# Patient Record
Sex: Male | Born: 1940
Health system: Southern US, Community
[De-identification: ages and names within clinical notes are randomized; demographics above are authoritative.]

## PROBLEM LIST (undated history)

## (undated) DIAGNOSIS — E7800 Pure hypercholesterolemia, unspecified: Secondary | ICD-10-CM

## (undated) DIAGNOSIS — F32A Depression, unspecified: Secondary | ICD-10-CM

## (undated) DIAGNOSIS — M5136 Other intervertebral disc degeneration, lumbar region: Secondary | ICD-10-CM

## (undated) DIAGNOSIS — G629 Polyneuropathy, unspecified: Secondary | ICD-10-CM

## (undated) DIAGNOSIS — C099 Malignant neoplasm of tonsil, unspecified: Secondary | ICD-10-CM

## (undated) DIAGNOSIS — M51369 Other intervertebral disc degeneration, lumbar region without mention of lumbar back pain or lower extremity pain: Secondary | ICD-10-CM

## (undated) DIAGNOSIS — E079 Disorder of thyroid, unspecified: Secondary | ICD-10-CM

## (undated) DIAGNOSIS — F319 Bipolar disorder, unspecified: Secondary | ICD-10-CM

## (undated) DIAGNOSIS — F329 Major depressive disorder, single episode, unspecified: Secondary | ICD-10-CM

## (undated) DIAGNOSIS — C61 Malignant neoplasm of prostate: Secondary | ICD-10-CM

## (undated) DIAGNOSIS — K209 Esophagitis, unspecified without bleeding: Secondary | ICD-10-CM

## (undated) DIAGNOSIS — I10 Essential (primary) hypertension: Secondary | ICD-10-CM

## (undated) DIAGNOSIS — H269 Unspecified cataract: Secondary | ICD-10-CM

## (undated) DIAGNOSIS — F419 Anxiety disorder, unspecified: Secondary | ICD-10-CM

## (undated) DIAGNOSIS — C140 Malignant neoplasm of pharynx, unspecified: Secondary | ICD-10-CM

## (undated) HISTORY — DX: Disorder of thyroid, unspecified: E07.9

## (undated) HISTORY — PX: CARDIAC ELECTROPHYSIOLOGY STUDY AND ABLATION: SHX1294

## (undated) HISTORY — PX: OTHER SURGICAL HISTORY: SHX169

## (undated) HISTORY — DX: Other intervertebral disc degeneration, lumbar region without mention of lumbar back pain or lower extremity pain: M51.369

## (undated) HISTORY — DX: Pure hypercholesterolemia, unspecified: E78.00

## (undated) HISTORY — PX: PROSTATE BIOPSY: SHX241

## (undated) HISTORY — DX: Bipolar disorder, unspecified: F31.9

## (undated) HISTORY — DX: Anxiety disorder, unspecified: F41.9

## (undated) HISTORY — PX: TONSILLECTOMY: SUR1361

## (undated) HISTORY — PX: INGUINAL HERNIA REPAIR: SUR1180

## (undated) HISTORY — DX: Essential (primary) hypertension: I10

## (undated) HISTORY — DX: Other intervertebral disc degeneration, lumbar region: M51.36

## (undated) HISTORY — DX: Unspecified cataract: H26.9

## (undated) HISTORY — DX: Polyneuropathy, unspecified: G62.9

## (undated) HISTORY — DX: Malignant neoplasm of prostate: C61

## (undated) HISTORY — DX: Depression, unspecified: F32.A

## (undated) HISTORY — PX: EYE SURGERY: SHX253

## (undated) HISTORY — DX: Esophagitis, unspecified without bleeding: K20.90

## (undated) HISTORY — DX: Malignant neoplasm of tonsil, unspecified: C09.9

## (undated) HISTORY — DX: Malignant neoplasm of pharynx, unspecified: C14.0

---
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
## 2007-04-07 HISTORY — PX: COLONOSCOPY: SHX174

## 2007-09-18 ENCOUNTER — Encounter (INDEPENDENT_AMBULATORY_CARE_PROVIDER_SITE_OTHER): Payer: Self-pay | Admitting: Urology

## 2007-09-18 ENCOUNTER — Inpatient Hospital Stay (HOSPITAL_COMMUNITY): Admission: RE | Admit: 2007-09-18 | Discharge: 2007-09-19 | Payer: Self-pay | Admitting: Urology

## 2010-06-15 HISTORY — PX: ESOPHAGOGASTRODUODENOSCOPY: SHX1529

## 2010-10-24 NOTE — Op Note (Signed)
NAME:  ALOYSIUS, [name] NO.:  [id number]   MEDICAL RECORD NO.:  [id number]          PATIENT TYPE:  INP   LOCATION:  1444                         FACILITY:  [facility]   PHYSICIAN:  Heloise Purpura, MD      DATE OF BIRTH:  20-Oct-1940   DATE OF PROCEDURE:  09/18/2007  DATE OF DISCHARGE:                               OPERATIVE REPORT   PREOPERATIVE DIAGNOSIS:  Clinically localized adenocarcinoma of the  prostate (clinical stage T1C N0 M0).   POSTOPERATIVE DIAGNOSIS:  Clinically localized adenocarcinoma of the  prostate (clinical stage T1C N0 M0).   PROCEDURE:  1. Robotic assisted laparoscopic radical prostatectomy (bilateral      nerve sparing).  2. Bilateral laparoscopic pelvic lymphadenectomy.   SURGEON:  Heloise Purpura, M.D.   ASSISTANT:  Delman Kitten, M.D.   ANESTHESIA:  General.   COMPLICATIONS:  None.   ESTIMATED BLOOD LOSS:  100 mL.   FLUIDS REPLACED:  2500 mL of Ringer's lactate.   SPECIMENS:  1. Prostate and seminal vesicles.  2. Right pelvic lymph nodes.  3. Left pelvic lymph nodes.   DISPOSITION:  Specimens to pathology.   DRAINS:  1. 20 French Coude catheter.  2. #19 Blake pelvic drain.   INDICATIONS FOR PROCEDURE:  The patient is a 70 year old gentleman with  clinically localized adenocarcinoma of the prostate.  After discussion  regarding management options for treatment, he elected to proceed with  surgical therapy and the above procedures.  Potential risks,  complications, and alternative treatment options were discussed in  detail and informed consent was obtained.   DESCRIPTION OF PROCEDURE:  The patient was taken to the operating room  and a general anesthesia was administered.  He was given preoperative  antibiotics, placed in the dorsal lithotomy position, and prepped and  draped in the usual sterile fashion.  Next a preoperative timeout was  performed.  Foley catheter was then placed and the site was selected  just to the left of  the umbilicus for placement of the camera port.  This was placed using a standard open Hasson technique.  This allowed  entry into the peritoneal cavity under direct vision and without  difficulty.  A 12 mm port was then placed and a pneumoperitoneum was  established.  A 0 degree lens was used to inspect the abdomen and there  was no evidence for any intra-abdominal injuries or other abnormalities.  The remaining ports were then placed.  Bilateral 8 mm ports were placed  10 cm lateral to and just inferior to the camera port site.  An  additional 8 mm robotic port was placed in the far left lateral  abdominal wall.  A 5 mm port was placed between the camera port and the  right robotic port and an additional 12 mm port was placed in the far  right lateral abdominal wall for laparoscopic assistance.  All ports  were placed under direct vision and without difficulty.  The surgical  cart was then docked.  With the aid of the cautery scissors, the bladder  was reflected posteriorly allowing entry into the  space of Retzius and  identification of the endopelvic fascia and prostate.  The endopelvic  fascia was then incised from the apex back to the base of the prostate  bilaterally and the underlying levator muscle fibers were swept  laterally off the prostate.  This isolated the dorsal venous complex  which was then stapled and divided with a 45 mm Flexi ETS stapler.  The  bladder neck was then identified with the aid of Foley catheter  manipulation and the bladder was entered anteriorly exposing the Foley  catheter.  The catheter balloon was deflated and the catheter was  brought into the operative field and used to retract the prostate  anteriorly.  This exposed the posterior bladder neck which was divided  and dissected proceeded between the bladder and prostate until the vasa  deferentia and seminal vesicles were identified.  The vasa deferentia  were isolated, divided, and lifted anteriorly.   The seminal vesicles  were then dissected down to their tips with care to control the seminal  vesicle arterial blood supply with Hemlock clips.  The seminal vesicles  were then lifted anteriorly and the space between Denonvillier's fascia  and the anterior rectum was bluntly developed thereby isolating the  vascular pedicles of the prostate.  The lateral prostatic fascia was  incised sharply bilaterally allowing the neurovascular bundles to be  released.  The vascular pedicles of the prostate were then ligated with  Hemlock clips above the level of the neurovascular bundles and divided  with sharp cold scissors dissection.  The neurovascular bundles were  then swept off the apex of the prostate and urethra.  The urethra was  sharply divided allowing the prostate specimen to be disarticulated.  The pelvis was then copiously irrigated and hemostasis was ensured.  There was no evidence for a rectal injury.  Attention was then turned to  the right pelvic side wall where the fibrofatty tissue between the  external iliac vein, confluence of the iliac vessels, hypogastric  artery, and Cooper's ligament was dissected free from the pelvic side  wall with care to preserve the obturator nerve.  Hemlock clips were used  for lymphostasis and hemostasis.  The specimen was passed off for  permanent pathologic analysis.  An identical procedure was then  performed on the contralateral side.  Attention then turned to the  urethral anastomosis.  A 2-0 Vicryl slip knot was placed between  Denonvillier's fascia, the posterior bladder, and the posterior urethra  to reapproximate these structures.  The stitch through the bladder neck  did come through the bladder neck but did help to support the urethra  back into its normal anatomic position in the pelvis.  A double arm 3-0  Monocryl suture was then used to perform a 360 degree running tension-  free anastomosis between the bladder neck and urethra to  reapproximate  the bladder neck and urethra.  A new 20 French Coude catheter was  inserted and irrigated.  There were no blood clots within the bladder  and the anastomosis appeared to be water tight.  A #19 Blake drain was  then brought through the left robotic port and appropriately positioned  in the pelvis.  It was secured to the skin with a nylon suture.  The  surgical cart was then undocked.  The right lateral 12 mm port site was  closed with a 0 Vicryl suture placed with the aid of Carter-Thomason  suture passer device.  The remaining port sites were removed under  direct vision and the prostate specimen was removed intact within the  Endopouch bag via the periumbilical port site.  This fascial opening was  then closed with a running 0 Vicryl suture.  All port sites were  injected with 0.25% Marcaine and reapproximated at the skin level with  staples.  Sterile dressings were applied.  The patient appeared to  tolerate the procedure well without complications.  He was able to be  extubated and transferred to the recovery room in satisfactory  condition.     Heloise Purpura, MD  Electronically Signed    LB/MEDQ  D:  09/18/2007  T:  09/18/2007  Job:  [id number]

## 2011-03-06 LAB — CBC
HCT: 47
MCHC: 33.8
MCV: 88.1
Platelets: 193
RDW: 13.4

## 2011-03-06 LAB — BASIC METABOLIC PANEL
BUN: 8
Chloride: 103
Creatinine, Ser: 1.09
Glucose, Bld: 113 — ABNORMAL HIGH
Potassium: 3.8

## 2011-03-06 LAB — TYPE AND SCREEN: ABO/RH(D): O NEG

## 2011-03-06 LAB — HEMOGLOBIN AND HEMATOCRIT, BLOOD
HCT: 40.2
Hemoglobin: 12.7 — ABNORMAL LOW

## 2014-08-10 DIAGNOSIS — I2510 Atherosclerotic heart disease of native coronary artery without angina pectoris: Secondary | ICD-10-CM

## 2014-08-10 DIAGNOSIS — I219 Acute myocardial infarction, unspecified: Secondary | ICD-10-CM | POA: Insufficient documentation

## 2014-08-10 HISTORY — DX: Acute myocardial infarction, unspecified: I21.9

## 2014-08-10 HISTORY — DX: Atherosclerotic heart disease of native coronary artery without angina pectoris: I25.10

## 2014-08-15 DIAGNOSIS — I2129 ST elevation (STEMI) myocardial infarction involving other sites: Secondary | ICD-10-CM

## 2014-08-15 DIAGNOSIS — M6282 Rhabdomyolysis: Secondary | ICD-10-CM

## 2014-08-15 HISTORY — DX: ST elevation (STEMI) myocardial infarction involving other sites: I21.29

## 2014-08-15 HISTORY — DX: Rhabdomyolysis: M62.82

## 2014-08-17 DIAGNOSIS — R7401 Elevation of levels of liver transaminase levels: Secondary | ICD-10-CM | POA: Insufficient documentation

## 2014-08-17 DIAGNOSIS — E039 Hypothyroidism, unspecified: Secondary | ICD-10-CM | POA: Insufficient documentation

## 2014-08-17 DIAGNOSIS — N289 Disorder of kidney and ureter, unspecified: Secondary | ICD-10-CM

## 2014-08-17 HISTORY — DX: Disorder of kidney and ureter, unspecified: N28.9

## 2014-08-17 HISTORY — DX: Hypothyroidism, unspecified: E03.9

## 2014-08-17 HISTORY — DX: Elevation of levels of liver transaminase levels: R74.01

## 2014-08-19 DIAGNOSIS — R112 Nausea with vomiting, unspecified: Secondary | ICD-10-CM

## 2014-08-19 DIAGNOSIS — R9431 Abnormal electrocardiogram [ECG] [EKG]: Secondary | ICD-10-CM

## 2014-08-19 DIAGNOSIS — R079 Chest pain, unspecified: Secondary | ICD-10-CM

## 2014-08-19 HISTORY — DX: Abnormal electrocardiogram (ECG) (EKG): R94.31

## 2014-08-19 HISTORY — DX: Nausea with vomiting, unspecified: R11.2

## 2014-08-19 HISTORY — DX: Chest pain, unspecified: R07.9

## 2014-08-21 DIAGNOSIS — B351 Tinea unguium: Secondary | ICD-10-CM | POA: Insufficient documentation

## 2014-08-21 HISTORY — DX: Tinea unguium: B35.1

## 2014-09-14 DIAGNOSIS — I959 Hypotension, unspecified: Secondary | ICD-10-CM

## 2014-09-14 DIAGNOSIS — I200 Unstable angina: Secondary | ICD-10-CM | POA: Insufficient documentation

## 2014-09-14 DIAGNOSIS — I252 Old myocardial infarction: Secondary | ICD-10-CM

## 2014-09-14 HISTORY — DX: Hypotension, unspecified: I95.9

## 2014-09-14 HISTORY — DX: Old myocardial infarction: I25.2

## 2014-09-14 HISTORY — DX: Unstable angina: I20.0

## 2014-09-17 DIAGNOSIS — R001 Bradycardia, unspecified: Secondary | ICD-10-CM

## 2014-09-17 HISTORY — DX: Bradycardia, unspecified: R00.1

## 2014-09-20 DIAGNOSIS — N179 Acute kidney failure, unspecified: Secondary | ICD-10-CM

## 2014-09-20 HISTORY — DX: Acute kidney failure, unspecified: N17.9

## 2014-09-21 DIAGNOSIS — G250 Essential tremor: Secondary | ICD-10-CM

## 2014-09-21 DIAGNOSIS — T451X5A Adverse effect of antineoplastic and immunosuppressive drugs, initial encounter: Secondary | ICD-10-CM

## 2014-09-21 DIAGNOSIS — G620 Drug-induced polyneuropathy: Secondary | ICD-10-CM

## 2014-09-21 HISTORY — DX: Adverse effect of antineoplastic and immunosuppressive drugs, initial encounter: G62.0

## 2014-09-21 HISTORY — DX: Essential tremor: G25.0

## 2014-09-21 HISTORY — DX: Drug-induced polyneuropathy: T45.1X5A

## 2015-07-05 DIAGNOSIS — R946 Abnormal results of thyroid function studies: Secondary | ICD-10-CM

## 2015-07-05 DIAGNOSIS — C099 Malignant neoplasm of tonsil, unspecified: Secondary | ICD-10-CM

## 2015-11-17 DIAGNOSIS — Z85819 Personal history of malignant neoplasm of unspecified site of lip, oral cavity, and pharynx: Secondary | ICD-10-CM | POA: Diagnosis not present

## 2015-11-17 DIAGNOSIS — R070 Pain in throat: Secondary | ICD-10-CM | POA: Diagnosis not present

## 2015-12-07 DIAGNOSIS — Z923 Personal history of irradiation: Secondary | ICD-10-CM | POA: Diagnosis not present

## 2015-12-07 DIAGNOSIS — M542 Cervicalgia: Secondary | ICD-10-CM | POA: Diagnosis not present

## 2015-12-07 DIAGNOSIS — Z85818 Personal history of malignant neoplasm of other sites of lip, oral cavity, and pharynx: Secondary | ICD-10-CM | POA: Diagnosis not present

## 2015-12-07 DIAGNOSIS — R070 Pain in throat: Secondary | ICD-10-CM | POA: Diagnosis not present

## 2016-01-03 DIAGNOSIS — F329 Major depressive disorder, single episode, unspecified: Secondary | ICD-10-CM | POA: Diagnosis not present

## 2016-01-03 DIAGNOSIS — Z8589 Personal history of malignant neoplasm of other organs and systems: Secondary | ICD-10-CM | POA: Diagnosis not present

## 2016-01-03 DIAGNOSIS — R5383 Other fatigue: Secondary | ICD-10-CM | POA: Diagnosis not present

## 2017-02-22 DIAGNOSIS — C61 Malignant neoplasm of prostate: Secondary | ICD-10-CM | POA: Diagnosis not present

## 2017-02-22 DIAGNOSIS — Z23 Encounter for immunization: Secondary | ICD-10-CM | POA: Diagnosis not present

## 2017-02-22 DIAGNOSIS — Z125 Encounter for screening for malignant neoplasm of prostate: Secondary | ICD-10-CM | POA: Diagnosis not present

## 2017-02-22 DIAGNOSIS — Z0000 Encounter for general adult medical examination without abnormal findings: Secondary | ICD-10-CM | POA: Diagnosis not present

## 2017-02-22 DIAGNOSIS — Z79899 Other long term (current) drug therapy: Secondary | ICD-10-CM | POA: Diagnosis not present

## 2017-02-22 DIAGNOSIS — E785 Hyperlipidemia, unspecified: Secondary | ICD-10-CM | POA: Diagnosis not present

## 2017-02-22 DIAGNOSIS — I10 Essential (primary) hypertension: Secondary | ICD-10-CM | POA: Diagnosis not present

## 2017-02-22 DIAGNOSIS — F329 Major depressive disorder, single episode, unspecified: Secondary | ICD-10-CM | POA: Diagnosis not present

## 2017-02-22 DIAGNOSIS — E039 Hypothyroidism, unspecified: Secondary | ICD-10-CM | POA: Diagnosis not present

## 2017-11-15 DIAGNOSIS — Z85818 Personal history of malignant neoplasm of other sites of lip, oral cavity, and pharynx: Secondary | ICD-10-CM | POA: Diagnosis not present

## 2017-11-15 DIAGNOSIS — Z923 Personal history of irradiation: Secondary | ICD-10-CM | POA: Diagnosis not present

## 2017-11-15 DIAGNOSIS — Z9221 Personal history of antineoplastic chemotherapy: Secondary | ICD-10-CM | POA: Diagnosis not present

## 2017-11-15 DIAGNOSIS — R1310 Dysphagia, unspecified: Secondary | ICD-10-CM | POA: Diagnosis not present

## 2017-11-15 DIAGNOSIS — R0989 Other specified symptoms and signs involving the circulatory and respiratory systems: Secondary | ICD-10-CM | POA: Diagnosis not present

## 2017-11-22 DIAGNOSIS — R1310 Dysphagia, unspecified: Secondary | ICD-10-CM | POA: Diagnosis not present

## 2017-11-25 DIAGNOSIS — R0989 Other specified symptoms and signs involving the circulatory and respiratory systems: Secondary | ICD-10-CM | POA: Diagnosis not present

## 2017-11-25 DIAGNOSIS — D164 Benign neoplasm of bones of skull and face: Secondary | ICD-10-CM | POA: Diagnosis not present

## 2017-12-02 DIAGNOSIS — K449 Diaphragmatic hernia without obstruction or gangrene: Secondary | ICD-10-CM | POA: Diagnosis not present

## 2017-12-02 DIAGNOSIS — R1310 Dysphagia, unspecified: Secondary | ICD-10-CM | POA: Diagnosis not present

## 2017-12-05 DIAGNOSIS — Z9221 Personal history of antineoplastic chemotherapy: Secondary | ICD-10-CM | POA: Diagnosis not present

## 2017-12-05 DIAGNOSIS — Z923 Personal history of irradiation: Secondary | ICD-10-CM | POA: Diagnosis not present

## 2017-12-05 DIAGNOSIS — Z85818 Personal history of malignant neoplasm of other sites of lip, oral cavity, and pharynx: Secondary | ICD-10-CM | POA: Diagnosis not present

## 2017-12-05 DIAGNOSIS — R0989 Other specified symptoms and signs involving the circulatory and respiratory systems: Secondary | ICD-10-CM | POA: Diagnosis not present

## 2017-12-14 DIAGNOSIS — M542 Cervicalgia: Secondary | ICD-10-CM | POA: Diagnosis not present

## 2017-12-14 DIAGNOSIS — S301XXA Contusion of abdominal wall, initial encounter: Secondary | ICD-10-CM | POA: Diagnosis not present

## 2017-12-14 DIAGNOSIS — S161XXA Strain of muscle, fascia and tendon at neck level, initial encounter: Secondary | ICD-10-CM | POA: Diagnosis not present

## 2017-12-14 DIAGNOSIS — S20219A Contusion of unspecified front wall of thorax, initial encounter: Secondary | ICD-10-CM | POA: Diagnosis not present

## 2017-12-14 DIAGNOSIS — R51 Headache: Secondary | ICD-10-CM | POA: Diagnosis not present

## 2018-02-07 DIAGNOSIS — I160 Hypertensive urgency: Secondary | ICD-10-CM | POA: Diagnosis not present

## 2018-02-12 DIAGNOSIS — I10 Essential (primary) hypertension: Secondary | ICD-10-CM | POA: Diagnosis not present

## 2018-02-12 DIAGNOSIS — L98499 Non-pressure chronic ulcer of skin of other sites with unspecified severity: Secondary | ICD-10-CM | POA: Diagnosis not present

## 2018-02-12 DIAGNOSIS — E039 Hypothyroidism, unspecified: Secondary | ICD-10-CM | POA: Diagnosis not present

## 2018-02-12 DIAGNOSIS — Z6833 Body mass index (BMI) 33.0-33.9, adult: Secondary | ICD-10-CM | POA: Diagnosis not present

## 2018-02-17 DIAGNOSIS — L281 Prurigo nodularis: Secondary | ICD-10-CM | POA: Diagnosis not present

## 2018-02-17 DIAGNOSIS — L820 Inflamed seborrheic keratosis: Secondary | ICD-10-CM | POA: Diagnosis not present

## 2018-03-10 DIAGNOSIS — L918 Other hypertrophic disorders of the skin: Secondary | ICD-10-CM | POA: Diagnosis not present

## 2018-03-10 DIAGNOSIS — L820 Inflamed seborrheic keratosis: Secondary | ICD-10-CM | POA: Diagnosis not present

## 2018-03-10 DIAGNOSIS — C44619 Basal cell carcinoma of skin of left upper limb, including shoulder: Secondary | ICD-10-CM | POA: Diagnosis not present

## 2018-03-25 DIAGNOSIS — Z6834 Body mass index (BMI) 34.0-34.9, adult: Secondary | ICD-10-CM | POA: Diagnosis not present

## 2018-03-25 DIAGNOSIS — Z23 Encounter for immunization: Secondary | ICD-10-CM | POA: Diagnosis not present

## 2018-03-25 DIAGNOSIS — R600 Localized edema: Secondary | ICD-10-CM | POA: Diagnosis not present

## 2018-04-04 DIAGNOSIS — N390 Urinary tract infection, site not specified: Secondary | ICD-10-CM | POA: Diagnosis not present

## 2018-04-04 DIAGNOSIS — Z6834 Body mass index (BMI) 34.0-34.9, adult: Secondary | ICD-10-CM | POA: Diagnosis not present

## 2018-04-15 DIAGNOSIS — E039 Hypothyroidism, unspecified: Secondary | ICD-10-CM | POA: Diagnosis not present

## 2018-04-15 DIAGNOSIS — Z79899 Other long term (current) drug therapy: Secondary | ICD-10-CM | POA: Diagnosis not present

## 2018-04-15 DIAGNOSIS — I10 Essential (primary) hypertension: Secondary | ICD-10-CM | POA: Diagnosis not present

## 2018-04-15 DIAGNOSIS — Z0000 Encounter for general adult medical examination without abnormal findings: Secondary | ICD-10-CM | POA: Diagnosis not present

## 2018-04-15 DIAGNOSIS — E785 Hyperlipidemia, unspecified: Secondary | ICD-10-CM | POA: Diagnosis not present

## 2018-04-24 DIAGNOSIS — M62838 Other muscle spasm: Secondary | ICD-10-CM | POA: Diagnosis not present

## 2018-05-06 DIAGNOSIS — M542 Cervicalgia: Secondary | ICD-10-CM | POA: Diagnosis not present

## 2018-05-09 DIAGNOSIS — M542 Cervicalgia: Secondary | ICD-10-CM | POA: Diagnosis not present

## 2018-05-13 DIAGNOSIS — M542 Cervicalgia: Secondary | ICD-10-CM | POA: Diagnosis not present

## 2018-05-15 DIAGNOSIS — M542 Cervicalgia: Secondary | ICD-10-CM | POA: Diagnosis not present

## 2018-05-20 DIAGNOSIS — M542 Cervicalgia: Secondary | ICD-10-CM | POA: Diagnosis not present

## 2018-05-22 DIAGNOSIS — M542 Cervicalgia: Secondary | ICD-10-CM | POA: Diagnosis not present

## 2018-05-26 DIAGNOSIS — M542 Cervicalgia: Secondary | ICD-10-CM | POA: Diagnosis not present

## 2018-06-02 DIAGNOSIS — M542 Cervicalgia: Secondary | ICD-10-CM | POA: Diagnosis not present

## 2018-06-13 DIAGNOSIS — M542 Cervicalgia: Secondary | ICD-10-CM | POA: Diagnosis not present

## 2018-08-11 DIAGNOSIS — S134XXA Sprain of ligaments of cervical spine, initial encounter: Secondary | ICD-10-CM | POA: Insufficient documentation

## 2018-08-11 DIAGNOSIS — M542 Cervicalgia: Secondary | ICD-10-CM | POA: Diagnosis not present

## 2018-08-11 DIAGNOSIS — S134XXS Sprain of ligaments of cervical spine, sequela: Secondary | ICD-10-CM | POA: Insufficient documentation

## 2018-08-11 HISTORY — DX: Sprain of ligaments of cervical spine, sequela: S13.4XXS

## 2018-08-16 DIAGNOSIS — S134XXA Sprain of ligaments of cervical spine, initial encounter: Secondary | ICD-10-CM | POA: Diagnosis not present

## 2018-09-29 DIAGNOSIS — M5030 Other cervical disc degeneration, unspecified cervical region: Secondary | ICD-10-CM | POA: Insufficient documentation

## 2018-09-29 HISTORY — DX: Other cervical disc degeneration, unspecified cervical region: M50.30

## 2018-10-02 DIAGNOSIS — G629 Polyneuropathy, unspecified: Secondary | ICD-10-CM | POA: Diagnosis not present

## 2018-10-02 DIAGNOSIS — Z6833 Body mass index (BMI) 33.0-33.9, adult: Secondary | ICD-10-CM | POA: Diagnosis not present

## 2018-10-28 DIAGNOSIS — M542 Cervicalgia: Secondary | ICD-10-CM | POA: Diagnosis not present

## 2018-11-06 DIAGNOSIS — Z6833 Body mass index (BMI) 33.0-33.9, adult: Secondary | ICD-10-CM | POA: Diagnosis not present

## 2018-11-06 DIAGNOSIS — G629 Polyneuropathy, unspecified: Secondary | ICD-10-CM | POA: Diagnosis not present

## 2018-11-13 DIAGNOSIS — M5030 Other cervical disc degeneration, unspecified cervical region: Secondary | ICD-10-CM | POA: Diagnosis not present

## 2018-11-16 DIAGNOSIS — R601 Generalized edema: Secondary | ICD-10-CM | POA: Diagnosis not present

## 2018-11-16 DIAGNOSIS — R21 Rash and other nonspecific skin eruption: Secondary | ICD-10-CM | POA: Diagnosis not present

## 2018-11-16 DIAGNOSIS — R2243 Localized swelling, mass and lump, lower limb, bilateral: Secondary | ICD-10-CM | POA: Diagnosis not present

## 2018-11-27 DIAGNOSIS — Z6832 Body mass index (BMI) 32.0-32.9, adult: Secondary | ICD-10-CM | POA: Diagnosis not present

## 2018-11-27 DIAGNOSIS — K219 Gastro-esophageal reflux disease without esophagitis: Secondary | ICD-10-CM | POA: Diagnosis not present

## 2018-11-27 DIAGNOSIS — I10 Essential (primary) hypertension: Secondary | ICD-10-CM | POA: Diagnosis not present

## 2018-11-27 DIAGNOSIS — L84 Corns and callosities: Secondary | ICD-10-CM | POA: Diagnosis not present

## 2018-11-27 DIAGNOSIS — R600 Localized edema: Secondary | ICD-10-CM | POA: Diagnosis not present

## 2018-11-27 DIAGNOSIS — Z79899 Other long term (current) drug therapy: Secondary | ICD-10-CM | POA: Diagnosis not present

## 2018-12-11 DIAGNOSIS — L84 Corns and callosities: Secondary | ICD-10-CM

## 2018-12-11 DIAGNOSIS — M79676 Pain in unspecified toe(s): Secondary | ICD-10-CM | POA: Insufficient documentation

## 2018-12-11 HISTORY — DX: Corns and callosities: L84

## 2018-12-11 HISTORY — DX: Pain in unspecified toe(s): M79.676

## 2018-12-16 ENCOUNTER — Telehealth: Payer: Self-pay | Admitting: *Deleted

## 2018-12-16 ENCOUNTER — Other Ambulatory Visit: Payer: Self-pay

## 2018-12-16 ENCOUNTER — Encounter: Payer: Self-pay | Admitting: Neurology

## 2018-12-16 ENCOUNTER — Ambulatory Visit (INDEPENDENT_AMBULATORY_CARE_PROVIDER_SITE_OTHER): Payer: Medicare Other | Admitting: Neurology

## 2018-12-16 VITALS — BP 162/88 | HR 60 | Temp 97.7°F | Ht 69.0 in | Wt 214.0 lb

## 2018-12-16 DIAGNOSIS — G629 Polyneuropathy, unspecified: Secondary | ICD-10-CM

## 2018-12-16 DIAGNOSIS — R52 Pain, unspecified: Secondary | ICD-10-CM | POA: Diagnosis not present

## 2018-12-16 DIAGNOSIS — E559 Vitamin D deficiency, unspecified: Secondary | ICD-10-CM | POA: Diagnosis not present

## 2018-12-16 DIAGNOSIS — R799 Abnormal finding of blood chemistry, unspecified: Secondary | ICD-10-CM | POA: Diagnosis not present

## 2018-12-16 DIAGNOSIS — E538 Deficiency of other specified B group vitamins: Secondary | ICD-10-CM | POA: Diagnosis not present

## 2018-12-16 HISTORY — DX: Polyneuropathy, unspecified: G62.9

## 2018-12-16 MED ORDER — OXCARBAZEPINE 150 MG PO TABS: 150.0000 mg | ORAL_TABLET | Freq: Two times a day (BID) | ORAL | 11 refills | Status: DC

## 2018-12-16 MED ORDER — LIDOCAINE 3.75 % EX CREA: 4.0000 g | TOPICAL_CREAM | CUTANEOUS | 11 refills | Status: DC | PRN

## 2018-12-16 MED ORDER — DICLOFENAC SODIUM 3 % TD GEL: 4.0000 g | TRANSDERMAL | 11 refills | Status: DC | PRN

## 2018-12-16 NOTE — Telephone Encounter (Signed)
PA for diclofenac 3% gel started through covermymeds.com (key: [id number]).  Pt has coverage with [employer] [phone]).  [id number].  Decision pending.

## 2018-12-16 NOTE — Progress Notes (Signed)
PATIENT: [name] DOB: December 17, 1940  Chief Complaint  Patient presents with   Peripheral Neuropathy    He is here with his wife, [name].  Reports burning in bilateral feet and pain in legs.  His feet often swell. He has failed therapy with gabapentin, Lyrica, amitriptyline and Requip.  Symptoms present for 50+ years but have become especially worse over the last 6-7 years.    PCP    Jorge Hipps, MD     HISTORICAL  [name] is our 78 year old male, accompanied by his wife, seen in request by his primary care physician Dr. Helene [name], [name] for evaluation of peripheral neuropathy, initial evaluation was on December 16, 2018.  I have reviewed and summarized the referring note from the referring physician.  He had past medical history of hypertension, hyperlipidemia, hypothyroidism, on supplement, he also had a history of left tonsil squamous cell carcinoma, T2N2b M0, he was treated with surgery, chemo and radiation therapy in 2012  He complains of more than 50 years of history of bilateral feet paresthesia, initially involving the bottom of his feet, they feel thick, irritable, gradually getting worse over the years, now ascend to above ankle level now, especially since his chemotherapy in 2012, he began to complains of bilateral fingertips numbness tingling, burning sensation, it is present 24/7, constant, he describes it as 10 out of 10, getting worse at nighttime, sometimes he woke up in the middle of the night has to pace on the concrete pavement  He denies significant gait abnormality, denies bowel and bladder incontinence, denies difficulty using his hands  Over the years, he has tried and failed different medications, gabapentin, Lyrica, Cymbalta, amitriptyline, Requip  His paternal grandfather, also suffered numbness tingling of bilateral feet, Laboratory evaluation showed normal CBC hemoglobin 14.5, CMP, with creatinine of 1.7, LDL of 63, triglyceride of 159,  normal free T4, 1.09   REVIEW OF SYSTEMS: Full 14 system review of systems performed and notable only for as above All other review of systems were negative.  ALLERGIES: No Known Allergies  HOME MEDICATIONS: Current Outpatient Medications  Medication Sig Dispense Refill   amLODipine (NORVASC) 10 MG tablet Take 10 mg by mouth daily.     levothyroxine (SYNTHROID) 88 MCG tablet Take 88 mcg by mouth daily.     metoprolol succinate (TOPROL-XL) 25 MG 24 hr tablet Take 25 mg by mouth daily.     pantoprazole (PROTONIX) 40 MG tablet Take 40 mg by mouth daily.     simvastatin (ZOCOR) 40 MG tablet simvastatin 40 mg tablet  TAKE 1 TABLET BY MOUTH ONCE DAILY     No current facility-administered medications for this visit.     PAST MEDICAL HISTORY: Past Medical History:  Diagnosis Date   Anxiety    Bipolar disorder ([place])    w/dementia/psychotic episodes   Chronic depression    Degenerative lumbar disc    Hypercholesteremia    Hypertension    Malignant neoplasm of prostate ([place])    Peptic esophagitis    Peripheral neuropathy    Squamous cell carcinoma of left tonsil ([facility])    Throat cancer ([facility])    Thyroid disease     PAST SURGICAL HISTORY: Past Surgical History:  Procedure Laterality Date   CARDIAC ELECTROPHYSIOLOGY STUDY AND ABLATION     EYE SURGERY     INGUINAL HERNIA REPAIR     PROSTATE BIOPSY     SEBACEOUS CYST REMOVAL     TONSILLECTOMY  FAMILY HISTORY: Family History  Problem Relation Age of Onset   Other Mother        brain tumor - unsure if it was cancer   Heart disease Father    Prostate cancer Father     SOCIAL HISTORY: Social History   Socioeconomic History   Marital status: Married    Spouse name: Not on file   Number of children: 3   Years of education: 10th grade   Highest education level: Not on file  Occupational History   Occupation: Retired  Scientist, product/process development strain: Not on file   Food  insecurity    Worry: Not on file    Inability: Not on Lexicographer needs    Medical: Not on file    Non-medical: Not on file  Tobacco Use   Smoking status: Former Smoker    Quit date: 2011    Years since quitting: 9.5   Smokeless tobacco: Never Used  Substance and Sexual Activity   Alcohol use: Not Currently   Drug use: Never   Sexual activity: Not on file  Lifestyle   Physical activity    Days per week: Not on file    Minutes per session: Not on file   Stress: Not on file  Relationships   Social connections    Talks on phone: Not on file    Gets together: Not on file    Attends religious service: Not on file    Active member of club or organization: Not on file    Attends meetings of clubs or organizations: Not on file    Relationship status: Not on file   Intimate partner violence    Fear of current or ex partner: Not on file    Emotionally abused: Not on file    Physically abused: Not on file    Forced sexual activity: Not on file  Other Topics Concern   Not on file  Social History Narrative   Lives at home with wife.   Right-handed.   2 cups of caffeine per day.     PHYSICAL EXAM   Vitals:   12/16/18 1325  BP: (!) 162/88  Pulse: 60  Temp: 97.7 F (36.5 C)  Weight: 214 lb (97.1 kg)  Height: 5\' 9"  (1.753 m)    Not recorded      Body mass index is 31.6 kg/m.  PHYSICAL EXAMNIATION:  Gen: NAD, conversant, well nourised, obese, well groomed                     Cardiovascular: Regular rate rhythm, no peripheral edema, warm, nontender. Eyes: Conjunctivae clear without exudates or hemorrhage Neck: Supple, no carotid bruits. Pulmonary: Clear to auscultation bilaterally   NEUROLOGICAL EXAM:  MENTAL STATUS: Speech:    Speech is normal; fluent and spontaneous with normal comprehension.  Cognition:     Orientation to time, place and person     Normal recent and remote memory     Normal Attention span and concentration     Normal  Language, naming, repeating,spontaneous speech     Fund of knowledge   CRANIAL NERVES: CN II: Visual fields are full to confrontation.  Pupils are round equal and briskly reactive to light. CN [name], IV, VI: extraocular movement are normal. No ptosis. CN V: Facial sensation is intact to pinprick in all 3 divisions bilaterally. Corneal responses are intact.  CN VII: Face is symmetric with normal eye closure and smile. CN  VIII: Hearing is normal to rubbing fingers CN IX, X: Palate elevates symmetrically. Phonation is normal. CN XI: Head turning and shoulder shrug are intact CN XII: Tongue is midline with normal movements and no atrophy.  MOTOR: Bilateral lower extremity pitting edema, there is no significant bilateral toes or  ankle weakness,  REFLEXES: Reflexes are 1 and symmetric at the biceps, triceps, knees, and absent at ankles. Plantar responses are flexor.  SENSORY: Length dependent decreased to light touch, pinprick, and vibratory sensation at  toes  COORDINATION: Rapid alternating movements and fine finger movements are intact. There is no dysmetria on finger-to-nose and heel-knee-shin.    GAIT/STANCE: Needs pushed up to get up from seated position, steady gait, able to stand up on tiptoes, heels, mild difficulty with tandem walking  DIAGNOSTIC DATA (LABS, IMAGING, TESTING) - I reviewed patient records, labs, notes, testing and imaging myself where available.   ASSESSMENT AND PLAN  Levone Otten [name] is a 78 y.o. male   Peripheral neuropathy  Most consistent with small fiber neuropathy  EMG nerve conduction study  Laboratory evaluations for potential etiology  Add on Trileptal 150 twice a day   Marcial Pacas, M.D. Ph.D.  [facility] [address], [place], [place] [address] Ph: [phone] Fax: [phone]  CC: Jorge Hipps, MD

## 2018-12-16 NOTE — Addendum Note (Signed)
Addended by: Marcial Pacas on: 12/16/2018 04:59 PM   Modules accepted: Orders

## 2018-12-17 ENCOUNTER — Ambulatory Visit (INDEPENDENT_AMBULATORY_CARE_PROVIDER_SITE_OTHER): Payer: Medicare Other | Admitting: Neurology

## 2018-12-17 ENCOUNTER — Encounter (INDEPENDENT_AMBULATORY_CARE_PROVIDER_SITE_OTHER): Payer: Medicare Other | Admitting: Neurology

## 2018-12-17 ENCOUNTER — Telehealth: Payer: Self-pay | Admitting: Neurology

## 2018-12-17 ENCOUNTER — Other Ambulatory Visit: Payer: Self-pay

## 2018-12-17 DIAGNOSIS — G629 Polyneuropathy, unspecified: Secondary | ICD-10-CM | POA: Diagnosis not present

## 2018-12-17 DIAGNOSIS — Z0289 Encounter for other administrative examinations: Secondary | ICD-10-CM

## 2018-12-17 MED ORDER — CITALOPRAM HYDROBROMIDE 20 MG PO TABS: 20.0000 mg | ORAL_TABLET | Freq: Every day | ORAL | 11 refills | Status: DC

## 2018-12-17 NOTE — Procedures (Signed)
Full Name: [name] Gender: Male MRN #: [id number] Date of Birth: Dec 27, 1940    Visit Date: 12/17/2018 11:02 Age: 78 Years 3 Months Old Examining Physician: Marcial Pacas, MD  Referring Physician: Marcial Pacas, MD History: 78 year old male, presented with more than 50 years history of bilateral feet paresthesia, burning pain  Summary of the tests: Nerve conduction study: Right lower superficial peroneal sensory responses were absent.  Bilateral sural sensory responses showed mildly decreased snap amplitude.  Bilateral tibial motor responses were normal.  Bilateral peroneal to EDB motor response showed mildly decreased conduction velocity, were otherwise normal.   Electromyography: Selected needle examinations were performed at right lower extremity muscles, there was no significant abnormality noted.  Conclusion: This is an abnormal study.  There is electrodiagnostic evidence of mild sensory predominant axonal peripheral neuropathy.    ------------------------------- Marcial Pacas, M.D. PhD  [facility] [place], [place] [address] Tel: [phone] Fax: [phone]        [facility]    Nerve / Sites Muscle Latency Ref. Amplitude Ref. Rel Amp Segments Distance Velocity Ref. Area    ms ms mV mV %  cm m/s m/s mVms  R Peroneal - EDB     Ankle EDB 5.4 ?6.5 3.0 ?2.0 100 Ankle - EDB 9   9.5     Fib head EDB 12.0  2.7  91.7 Fib head - Ankle 28 42 ?44 8.7     Pop fossa EDB 14.5  2.3  83 Pop fossa - Fib head 10 40 ?44 7.3         Pop fossa - Ankle      L Peroneal - EDB     Ankle EDB 4.8 ?6.5 5.1 ?2.0 100 Ankle - EDB 9   14.4     Fib head EDB 11.1  4.4  86.2 Fib head - Ankle 27 43 ?44 13.5     Pop fossa EDB 13.5  4.2  94.3 Pop fossa - Fib head 10 42 ?44 13.1         Pop fossa - Ankle      R Tibial - AH     Ankle AH 3.9 ?5.8 5.6 ?4.0 100 Ankle - AH 9   12.4     Pop fossa AH 12.8  3.4  60.7 Pop fossa - Ankle 37 41 ?41 11.4  L Tibial - AH     Ankle AH 4.1  ?5.8 4.7 ?4.0 100 Ankle - AH 9   9.4     Pop fossa AH 13.4  3.7  79.6 Pop fossa - Ankle 38 41 ?41 7.9             SNC    Nerve / Sites Rec. Site Peak Lat Ref.  Amp Ref. Segments Distance    ms ms V V  cm  R Sural - Ankle (Calf)     Calf Ankle 3.6 ?4.4 3 ?6 Calf - Ankle 14  L Sural - Ankle (Calf)     Calf Ankle 4.0 ?4.4 3 ?6 Calf - Ankle 14  R Superficial peroneal - Ankle     Lat leg Ankle NR ?4.4 NR ?6 Lat leg - Ankle 14  L Superficial peroneal - Ankle     Lat leg Ankle NR ?4.4 NR ?6 Lat leg - Ankle 14              F  Wave    Nerve F Lat Ref.  ms ms  R Tibial - AH 55.7 ?56.0  L Tibial - AH 55.1 ?56.0         EMG       EMG Summary Table    Spontaneous MUAP Recruitment  Muscle IA Fib PSW Fasc Other Amp Dur. Poly Pattern  R. Tibialis anterior Normal None None None _______ Normal Normal Normal Normal  R. Gastrocnemius (Medial head) Normal None None None _______ Normal Normal Normal Normal  R. Vastus lateralis Normal None None None _______ Normal Normal Normal Normal  R. Abductor hallucis Normal None None None _______ Normal Normal Normal Normal  R. Tibialis posterior Normal None None None _______ Normal Normal Normal Normal  R. Peroneus longus Normal None None None _______ Normal Normal Normal Normal

## 2018-12-17 NOTE — Telephone Encounter (Signed)
Please call patient, I have called in Celexa 20 mg daily as anti- anxiety medications, also make sure he is on schedule for skin biopsy

## 2018-12-17 NOTE — Telephone Encounter (Signed)
I spoke to his wife on [place].  She is aware the new prescription has been sent to the pharmacy.  He has been scheduled for a skin biopsy on 01/08/2019.

## 2018-12-17 NOTE — Telephone Encounter (Signed)
PA denied by [facility].  The plan will not cover it for polyneuropathy.  The least expensive pharmacy is [facility] with a goodrx coupon.  The cost for one tube (100 grams) is $49.05.  Dr. Krista Blue is seeing the patient today for NCV/EMG and will inform him of this information.

## 2018-12-18 LAB — PROTEIN ELECTROPHORESIS
A/G Ratio: 1.6 (ref 0.7–1.7)
Albumin ELP: 4 g/dL (ref 2.9–4.4)
Alpha 1: 0.2 g/dL (ref 0.0–0.4)
Alpha 2: 0.7 g/dL (ref 0.4–1.0)
Beta: 0.9 g/dL (ref 0.7–1.3)
Gamma Globulin: 0.7 g/dL (ref 0.4–1.8)
Globulin, Total: 2.5 g/dL (ref 2.2–3.9)

## 2018-12-18 LAB — COMPREHENSIVE METABOLIC PANEL
ALT: 26 IU/L (ref 0–44)
AST: 31 IU/L (ref 0–40)
Albumin/Globulin Ratio: 2.4 — ABNORMAL HIGH (ref 1.2–2.2)
Albumin: 4.6 g/dL (ref 3.7–4.7)
Alkaline Phosphatase: 81 IU/L (ref 39–117)
BUN/Creatinine Ratio: 13 (ref 10–24)
BUN: 19 mg/dL (ref 8–27)
Bilirubin Total: 0.3 mg/dL (ref 0.0–1.2)
CO2: 22 mmol/L (ref 20–29)
Calcium: 9.4 mg/dL (ref 8.6–10.2)
Chloride: 104 mmol/L (ref 96–106)
Creatinine, Ser: 1.45 mg/dL — ABNORMAL HIGH (ref 0.76–1.27)
GFR calc Af Amer: 54 mL/min/{1.73_m2} — ABNORMAL LOW (ref >59)
GFR calc non Af Amer: 46 mL/min/{1.73_m2} — ABNORMAL LOW (ref >59)
Globulin, Total: 1.9 g/dL (ref 1.5–4.5)
Glucose: 89 mg/dL (ref 65–99)
Potassium: 4.6 mmol/L (ref 3.5–5.2)
Sodium: 141 mmol/L (ref 134–144)
Total Protein: 6.5 g/dL (ref 6.0–8.5)

## 2018-12-18 LAB — CBC WITH DIFFERENTIAL/PLATELET
Basophils Absolute: 0.1 10*3/uL (ref 0.0–0.2)
Basos: 1 %
EOS (ABSOLUTE): 0.1 10*3/uL (ref 0.0–0.4)
Eos: 2 %
Hematocrit: 41.7 % (ref 37.5–51.0)
Hemoglobin: 14.6 g/dL (ref 13.0–17.7)
Immature Grans (Abs): 0 10*3/uL (ref 0.0–0.1)
Immature Granulocytes: 1 %
Lymphocytes Absolute: 0.8 10*3/uL (ref 0.7–3.1)
Lymphs: 13 %
MCH: 30.5 pg (ref 26.6–33.0)
MCHC: 35 g/dL (ref 31.5–35.7)
MCV: 87 fL (ref 79–97)
Monocytes Absolute: 0.6 10*3/uL (ref 0.1–0.9)
Monocytes: 9 %
Neutrophils Absolute: 4.7 10*3/uL (ref 1.4–7.0)
Neutrophils: 74 %
Platelets: 182 10*3/uL (ref 150–450)
RBC: 4.78 x10E6/uL (ref 4.14–5.80)
RDW: 13.7 % (ref 11.6–15.4)
WBC: 6.3 10*3/uL (ref 3.4–10.8)

## 2018-12-18 LAB — VITAMIN B12: Vitamin B-12: 419 pg/mL (ref 232–1245)

## 2018-12-18 LAB — SEDIMENTATION RATE: Sed Rate: 3 mm/hr (ref 0–30)

## 2018-12-18 LAB — C-REACTIVE PROTEIN: CRP: 4 mg/L (ref 0–10)

## 2018-12-18 LAB — TSH: TSH: 2.85 u[IU]/mL (ref 0.450–4.500)

## 2018-12-18 LAB — VITAMIN D 25 HYDROXY (VIT D DEFICIENCY, FRACTURES): Vit D, 25-Hydroxy: 35.5 ng/mL (ref 30.0–100.0)

## 2018-12-18 LAB — COPPER, SERUM: Copper: 103 ug/dL (ref 72–166)

## 2018-12-18 LAB — ANA W/REFLEX IF POSITIVE: Anti Nuclear Antibody (ANA): NEGATIVE

## 2018-12-18 LAB — FERRITIN: Ferritin: 72 ng/mL (ref 30–400)

## 2018-12-18 LAB — HEMOGLOBIN A1C
Est. average glucose Bld gHb Est-mCnc: 120 mg/dL
Hgb A1c MFr Bld: 5.8 % — ABNORMAL HIGH (ref 4.8–5.6)

## 2018-12-18 LAB — CK: Total CK: 568 U/L (ref 41–331)

## 2018-12-18 LAB — RPR: RPR Ser Ql: NONREACTIVE

## 2018-12-19 ENCOUNTER — Other Ambulatory Visit: Payer: Self-pay | Admitting: Neurology

## 2018-12-19 NOTE — Telephone Encounter (Signed)
Sherman called and wanted to know if the pt can get the 5% ointment or 2% jelly of the Lidocaine instead of the Lidocaine 3.75 % CREA. Please advise.

## 2018-12-22 MED ORDER — LIDOCAINE 5 % EX OINT: 1.0000 "application " | TOPICAL_OINTMENT | Freq: Three times a day (TID) | CUTANEOUS | 0 refills | Status: DC | PRN

## 2019-01-08 ENCOUNTER — Ambulatory Visit: Payer: Self-pay | Admitting: Neurology

## 2019-01-14 DIAGNOSIS — Z6831 Body mass index (BMI) 31.0-31.9, adult: Secondary | ICD-10-CM | POA: Diagnosis not present

## 2019-01-14 DIAGNOSIS — I10 Essential (primary) hypertension: Secondary | ICD-10-CM | POA: Diagnosis not present

## 2019-02-23 ENCOUNTER — Encounter: Payer: Self-pay | Admitting: Neurology

## 2019-02-23 ENCOUNTER — Other Ambulatory Visit: Payer: Self-pay

## 2019-02-23 ENCOUNTER — Ambulatory Visit (INDEPENDENT_AMBULATORY_CARE_PROVIDER_SITE_OTHER): Payer: Medicare Other | Admitting: Neurology

## 2019-02-23 VITALS — BP 172/91 | HR 67 | Temp 98.0°F | Ht 69.0 in | Wt 213.0 lb

## 2019-02-23 DIAGNOSIS — G629 Polyneuropathy, unspecified: Secondary | ICD-10-CM | POA: Diagnosis not present

## 2019-02-23 DIAGNOSIS — G6289 Other specified polyneuropathies: Secondary | ICD-10-CM | POA: Diagnosis not present

## 2019-02-23 DIAGNOSIS — R202 Paresthesia of skin: Secondary | ICD-10-CM | POA: Diagnosis not present

## 2019-02-23 NOTE — Progress Notes (Signed)
Patient was in left lateral recombinant position. Sterile technique. 1% lidocaine with epinephrine was used for local anesthesia. Punctuated skin biopsy was performed. 3 mm skin sample were obtained at at right foot, above left extensor digitorum brevis,  right lateral calf, 10 cm above lateral malleolus, right lateral thigh, 20 cm below superior iliac spine.  Patient tolerated the procedure well.  The wound was covered with neosporin antibiotic cream and bandage.

## 2019-02-24 ENCOUNTER — Telehealth: Payer: Self-pay | Admitting: *Deleted

## 2019-02-24 NOTE — Telephone Encounter (Signed)
Pt's wife Leda Gauze (on [place]) called. She was not able to come back with him yesterday for his procedure and she asked which medication Dr. Krista Blue discussed with pt. She was unaware of the Lidocaine ointment that was prescribed in July however she asked specifically about medication mentioned yesterday. She would like a return call @ [phone] and verbalized appreciation.

## 2019-02-24 NOTE — Telephone Encounter (Signed)
I spoke to the patient's wife and she is going to fill the lidocaine prescription to see if it will help alleviate his discomfort.  They would like to wait for the skin biopsy results to come back and then discuss other possible treatment options.

## 2019-02-24 NOTE — Telephone Encounter (Signed)
Please call his wife, we have checked with patient multiple times, he does not want to try any new medications, lidocaine cream was called in July, it is Ok to try lidocaine cream

## 2019-02-27 DIAGNOSIS — M47812 Spondylosis without myelopathy or radiculopathy, cervical region: Secondary | ICD-10-CM | POA: Diagnosis not present

## 2019-02-27 DIAGNOSIS — M5030 Other cervical disc degeneration, unspecified cervical region: Secondary | ICD-10-CM | POA: Diagnosis not present

## 2019-02-27 DIAGNOSIS — M545 Low back pain: Secondary | ICD-10-CM | POA: Diagnosis not present

## 2019-02-27 DIAGNOSIS — S134XXD Sprain of ligaments of cervical spine, subsequent encounter: Secondary | ICD-10-CM | POA: Diagnosis not present

## 2019-03-02 ENCOUNTER — Telehealth: Payer: Self-pay | Admitting: *Deleted

## 2019-03-02 NOTE — Telephone Encounter (Signed)
PA for lidocaine 5% ointment started on covermymeds ([id number]).  Pt has coverage with [employer] [id number]).  Decision pending.

## 2019-03-03 ENCOUNTER — Telehealth: Payer: Self-pay | Admitting: *Deleted

## 2019-03-03 NOTE — Addendum Note (Signed)
Addended by: Noberto Retort C on: 03/03/2019 03:06 PM   Modules accepted: Orders

## 2019-03-03 NOTE — Telephone Encounter (Addendum)
The goodrx.com price for lidocaine 5% ointment was a bit high for them.  I called the pharmacist at [facility] who stated there are several OTC options for lidocaine 4% that would be inexpensive.  The patient is agreeable to this plan.

## 2019-03-03 NOTE — Telephone Encounter (Signed)
Opened in error

## 2019-03-03 NOTE — Telephone Encounter (Signed)
Denied - response from insurance plan:  We are unable to approve your request for this drug. This drug is put on the skin. It is approved to numb sites prior to an injection, numb sites with bug bites, and to numb sites around ports (flexible tube placed in a vein) used to administer drugs or dialysis (process of cleaning blood).

## 2019-03-23 ENCOUNTER — Telehealth: Payer: Self-pay | Admitting: Neurology

## 2019-03-23 NOTE — Telephone Encounter (Signed)
Please call patient, biopsy showed findings consistent with small fiber neuropathy

## 2019-03-23 NOTE — Telephone Encounter (Signed)
I spoke to his wife on DPR and she verbalized understanding of his results.  He has failed the following medications:  Gabapentin, Lyrica, Trileptal, Amitriptyline, Requip, Lidocaine gel.  States his symptoms are intolerable and make it difficult for him to sleep. Says he is willing to try any other medication you suggest.

## 2019-03-24 MED ORDER — LAMOTRIGINE 25 MG PO TABS: ORAL_TABLET | ORAL | 0 refills | Status: DC

## 2019-03-24 MED ORDER — LAMOTRIGINE 100 MG PO TABS: 100.0000 mg | ORAL_TABLET | Freq: Two times a day (BID) | ORAL | 11 refills | Status: DC

## 2019-03-24 NOTE — Addendum Note (Signed)
Addended by: Marcial Pacas on: 03/24/2019 10:46 AM   Modules accepted: Orders

## 2019-03-24 NOTE — Telephone Encounter (Signed)
Please call patient, I have called in Lamotrigine 25mg  titration dosage, also 100mg  bid

## 2019-03-24 NOTE — Telephone Encounter (Addendum)
I returned the call to the patient's wife on DPR.  She verbalized understanding of titration instructions for lamotrigine 25mg .  She also understands that once he has completed the titration, then another prescription for lamotrigine 100mg  tablets, one BID is on file at the pharmacy.  She knows to call our office if he develops any rash.   I offered to make a follow up for him but his wife would like to call back to schedule after he starts the medication.  I also called [facility] in [place] and spoke to [place].  Reviewed lamotrigine instructions to avoid any confusion.  She will keep the 100mg  tablets on file until completion of the 25mg  supply.

## 2019-04-02 DIAGNOSIS — M47812 Spondylosis without myelopathy or radiculopathy, cervical region: Secondary | ICD-10-CM | POA: Diagnosis not present

## 2019-04-16 DIAGNOSIS — M5030 Other cervical disc degeneration, unspecified cervical region: Secondary | ICD-10-CM | POA: Diagnosis not present

## 2019-04-16 DIAGNOSIS — S134XXD Sprain of ligaments of cervical spine, subsequent encounter: Secondary | ICD-10-CM | POA: Diagnosis not present

## 2019-04-17 ENCOUNTER — Telehealth: Payer: Self-pay | Admitting: Neurology

## 2019-04-17 DIAGNOSIS — Z1331 Encounter for screening for depression: Secondary | ICD-10-CM | POA: Diagnosis not present

## 2019-04-17 DIAGNOSIS — I10 Essential (primary) hypertension: Secondary | ICD-10-CM | POA: Diagnosis not present

## 2019-04-17 DIAGNOSIS — Z0000 Encounter for general adult medical examination without abnormal findings: Secondary | ICD-10-CM | POA: Diagnosis not present

## 2019-04-17 DIAGNOSIS — E7800 Pure hypercholesterolemia, unspecified: Secondary | ICD-10-CM | POA: Diagnosis not present

## 2019-04-17 DIAGNOSIS — Z6831 Body mass index (BMI) 31.0-31.9, adult: Secondary | ICD-10-CM | POA: Diagnosis not present

## 2019-04-17 DIAGNOSIS — E039 Hypothyroidism, unspecified: Secondary | ICD-10-CM | POA: Diagnosis not present

## 2019-04-17 DIAGNOSIS — G629 Polyneuropathy, unspecified: Secondary | ICD-10-CM

## 2019-04-17 DIAGNOSIS — Z79899 Other long term (current) drug therapy: Secondary | ICD-10-CM | POA: Diagnosis not present

## 2019-04-17 DIAGNOSIS — G8929 Other chronic pain: Secondary | ICD-10-CM

## 2019-04-17 DIAGNOSIS — Z23 Encounter for immunization: Secondary | ICD-10-CM | POA: Diagnosis not present

## 2019-04-17 NOTE — Telephone Encounter (Signed)
I called pt wife. Pt still having pain in spite of lamotrigine. Pt also seeing pain mgmt Dr. Nelva Bush.   Recommend to continue lamotrigine for now. May need to consider other treatment options with pain mgmt clinic.  Will ask Dr. Krista Blue to review.    Penni Bombard, MD [id number], [id number] AM Certified in Neurology, Neurophysiology and Neuroimaging  [facility] [address] [place], [place] [address] [phone]

## 2019-04-17 NOTE — Telephone Encounter (Signed)
Pt's wife called stating that the pt is on the lamoTRIgine (LAMICTAL) 25 MG tablet and it is not working for him. She states that he is actually in a lot more pain now than before. Please advise.

## 2019-04-20 NOTE — Telephone Encounter (Signed)
Please call patient  he has tried and failed different medications, gabapentin, Lyrica, Cymbalta, amitriptyline, Requip, trileptal, now lamotrigine.  If he continues to have significant pain, may refer him to pain management

## 2019-04-20 NOTE — Telephone Encounter (Signed)
Left message requesting a return call.

## 2019-04-20 NOTE — Telephone Encounter (Signed)
Pt wife has called RN Sharyn Lull back, please call

## 2019-04-20 NOTE — Telephone Encounter (Signed)
I spoke to the patient's wife.  States Dr. Nelva Bush is treating his neck pain.  They are interested in being referred to a chronic pain management physician.

## 2019-04-21 NOTE — Telephone Encounter (Addendum)
Per vo by Dr. Krista Blue, okay to place referral for pain management physician.  Patient and wife aware to expect a call for scheduling.

## 2019-04-21 NOTE — Addendum Note (Signed)
Addended by: Noberto Retort C on: 04/21/2019 12:08 PM   Modules accepted: Orders

## 2019-05-12 DIAGNOSIS — M5030 Other cervical disc degeneration, unspecified cervical region: Secondary | ICD-10-CM | POA: Diagnosis not present

## 2019-06-19 DIAGNOSIS — K209 Esophagitis, unspecified without bleeding: Secondary | ICD-10-CM | POA: Diagnosis not present

## 2019-06-19 DIAGNOSIS — M47812 Spondylosis without myelopathy or radiculopathy, cervical region: Secondary | ICD-10-CM

## 2019-06-19 DIAGNOSIS — G8929 Other chronic pain: Secondary | ICD-10-CM | POA: Diagnosis not present

## 2019-06-19 DIAGNOSIS — Z6831 Body mass index (BMI) 31.0-31.9, adult: Secondary | ICD-10-CM | POA: Diagnosis not present

## 2019-06-19 HISTORY — DX: Spondylosis without myelopathy or radiculopathy, cervical region: M47.812

## 2019-06-26 ENCOUNTER — Encounter: Payer: Self-pay | Admitting: Gastroenterology

## 2019-06-30 DIAGNOSIS — M47812 Spondylosis without myelopathy or radiculopathy, cervical region: Secondary | ICD-10-CM | POA: Diagnosis not present

## 2019-07-08 ENCOUNTER — Other Ambulatory Visit: Payer: Self-pay

## 2019-07-08 ENCOUNTER — Ambulatory Visit (INDEPENDENT_AMBULATORY_CARE_PROVIDER_SITE_OTHER): Payer: Medicare Other | Admitting: Gastroenterology

## 2019-07-08 ENCOUNTER — Encounter: Payer: Self-pay | Admitting: Gastroenterology

## 2019-07-08 VITALS — BP 122/80 | HR 68 | Temp 98.9°F | Ht 68.0 in | Wt 206.0 lb

## 2019-07-08 DIAGNOSIS — R1310 Dysphagia, unspecified: Secondary | ICD-10-CM

## 2019-07-08 DIAGNOSIS — Z01818 Encounter for other preprocedural examination: Secondary | ICD-10-CM

## 2019-07-08 MED ORDER — PANTOPRAZOLE SODIUM 40 MG PO TBEC: 40.0000 mg | DELAYED_RELEASE_TABLET | Freq: Two times a day (BID) | ORAL | 11 refills | Status: DC

## 2019-07-08 NOTE — Progress Notes (Signed)
Chief Complaint: Dysphagia  Referring Provider:  [name], MD      ASSESSMENT AND PLAN;   #1.  GERD with Esophageal dysphagia. H/O XRT to the neck. D/d includes eso stricture, Schatzki's ring, motility disorder, eosinophilic esophagitis, pill induced esophagitis, r/o esophageal Ca or extrinsic lesions.  #2. 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  Did require PEG at that time. Neg recent ENT eval (Dr Gaylyn Cheers).  No further Onc FUs needed as per [facility] (Dr. Bobby Rumpf)  #3. Chronic constipation.  Likely exacerbated by medications including calcium channel blockers.  Plan: - Increase Protonix 40mg  po bid, 60, 6 refills - Miralax 17g po qd. - Ba swallow with Ba tab. Pl do lat and neck films as well - EGD with dil therafter in [place].  I explained risks and benefits. - Increase stool softeners to 2/day.   HPI:    [name] is a 79 y.o. male  Accompanied by his wife Known to me from [place].  Last seen in 2011  Has been having intermittent dysphagia mostly to solids x last 6 months.  Mostly cornbread, hamburgers and meats.  Mostly in the lower neck and epigastric area.  His reflux including heartburn has gotten worse despite Protonix 40 mg p.o. once a day.  He has lost 18 pounds over the last 6 months.  Denies having any significant nausea, vomiting or odynophagia.  Has history of longstanding chronic constipation.  Has to take 1 stool softener per day.  Still would have bowel movements at the frequency of once per week.  Hard stool without melena or hematochezia.  Does have some abdominal bloating.  Wants to hold off on colonoscopy until upper GI symptoms are better.  For his tonsillary carcinoma, he has been followed at [facility] by Dr. Bobby Rumpf.  Per patient " Dr. Bobby Rumpf has turned him loose" as his follow-up CTs/PET scan were all negative for recurrence.  Past GI procedures: -EGD with PEG 06/2010: Erosive esophagitis, s/p 24FR PEG placement -Colonoscopy  03/2007: Colonic polyp SP polypectomy, pancolonic diverticulosis. Bx- TA.  Wanted to hold off on colonoscopy. Past Medical History:  Diagnosis Date  . Anxiety   . Bipolar disorder ([place])    w/dementia/psychotic episodes  . Chronic depression   . Degenerative lumbar disc   . Hypercholesteremia   . Hypertension   . Malignant neoplasm of prostate ([place])   . Peptic esophagitis   . Peripheral neuropathy   . Squamous cell carcinoma of left tonsil ([facility])   . Throat cancer ([place])   . Thyroid disease     Past Surgical History:  Procedure Laterality Date  . CARDIAC ELECTROPHYSIOLOGY STUDY AND ABLATION    . COLONOSCOPY  04/07/2007   Small colonic polyp status post polypectomy. Pancolonic diverticulosis predominantly in the sigmoid colon. Internal hemorrhoids.   . ESOPHAGOGASTRODUODENOSCOPY  06/15/2010   Erosive esophagitis. Status post PEG placment  . EYE SURGERY    . INGUINAL HERNIA REPAIR    . PROSTATE BIOPSY    . SEBACEOUS CYST REMOVAL    . TONSILLECTOMY      Family History  Problem Relation Age of Onset  . Other Mother        brain tumor - unsure if it was cancer  . Heart disease Father   . Prostate cancer Father     Social History   Tobacco Use  . Smoking status: Former Smoker    Quit date: 2011    Years since quitting: 10.0  .  Smokeless tobacco: Never Used  Substance Use Topics  . Alcohol use: Not Currently  . Drug use: Never    Current Outpatient Medications  Medication Sig Dispense Refill  . amLODipine (NORVASC) 10 MG tablet Take 10 mg by mouth daily.    . citalopram (CELEXA) 20 MG tablet Take 1 tablet (20 mg total) by mouth daily. 30 tablet 11  . gabapentin (NEURONTIN) 300 MG capsule Take 300 mg by mouth 3 (three) times daily.    Marland Kitchen lamoTRIgine (LAMICTAL) 100 MG tablet Take 1 tablet (100 mg total) by mouth 2 (two) times daily. 60 tablet 11  . lamoTRIgine (LAMICTAL) 25 MG tablet 1 tab bid x one week 2 tab bid x 2nd week 3 tab bid x 3rd week 84 tablet 0  .  levothyroxine (SYNTHROID) 88 MCG tablet Take 88 mcg by mouth daily.    . Lidocaine 4 % GEL OTC PRN    . metoprolol succinate (TOPROL-XL) 25 MG 24 hr tablet Take 25 mg by mouth daily.    . pantoprazole (PROTONIX) 40 MG tablet Take 40 mg by mouth daily.    . simvastatin (ZOCOR) 40 MG tablet simvastatin 40 mg tablet  TAKE 1 TABLET BY MOUTH ONCE DAILY    . traMADol (ULTRAM) 50 MG tablet Take by mouth every 6 (six) hours as needed.     No current facility-administered medications for this visit.    Allergies  Allergen Reactions  . Trileptal [Oxcarbazepine] Rash    Review of Systems:  Constitutional: Denies fever, chills, diaphoresis, appetite change and fatigue.  HEENT: Denies photophobia, eye pain, redness, hearing loss, ear pain, congestion, sore throat, rhinorrhea, sneezing, mouth sores, neck pain, neck stiffness and tinnitus.   Respiratory: Denies SOB, DOE, cough, chest tightness,  and wheezing.   Cardiovascular: Denies chest pain, palpitations and leg swelling.  Genitourinary: Denies dysuria, urgency, frequency, hematuria, flank pain and difficulty urinating.  Musculoskeletal: Has myalgias, back pain, joint swelling, arthralgias and gait problem.  Skin: No rash.  Neurological: Denies dizziness, seizures, syncope, weakness, light-headedness, numbness and headaches.  Hematological: Denies adenopathy. Easy bruising, personal or family bleeding history  Psychiatric/Behavioral: Has anxiety or depression     Physical Exam:    BP 122/80   Pulse 68   Temp 98.9 F (37.2 C)   Ht 5\' 8"  (1.727 m)   Wt 206 lb (93.4 kg)   BMI 31.32 kg/m  Wt Readings from Last 3 Encounters:  07/08/19 206 lb (93.4 kg)  02/23/19 213 lb (96.6 kg)  12/16/18 214 lb (97.1 kg)   Constitutional:  Well-developed, in no acute distress. Psychiatric: Normal mood and affect. Behavior is normal. HEENT: Pupils normal.  Conjunctivae are normal. No scleral icterus.  Well-healed surgical scars. Neck supple.    Cardiovascular: Normal rate, regular rhythm. No edema Pulmonary/chest: Effort normal and breath sounds normal. No wheezing, rales or rhonchi. Abdominal: Soft, nondistended. Nontender. Bowel sounds active throughout. There are no masses palpable. No hepatomegaly. Rectal:  defered Neurological: Alert and oriented to person place and time. Skin: Skin is warm and dry. No rashes noted.  Data Reviewed: I have personally reviewed following labs and imaging studies  CBC: CBC Latest Ref Rng & Units 12/16/2018 09/19/2007 09/18/2007  WBC 3.4 - 10.8 x10E3/uL 6.3 - -  Hemoglobin 13.0 - 17.7 g/dL 14.6 13.9 12.7(L)  Hematocrit 37.5 - 51.0 % 41.7 40.2 38.2(L)  Platelets 150 - 450 x10E3/uL 182 - -    CMP: CMP Latest Ref Rng & Units 12/16/2018 09/15/2007  Glucose 65 -  99 mg/dL 89 113(H)  BUN 8 - 27 mg/dL 19 8  Creatinine 0.76 - 1.27 mg/dL 1.45(H) 1.09  Sodium 134 - 144 mmol/L 141 138  Potassium 3.5 - 5.2 mmol/L 4.6 3.8  Chloride 96 - 106 mmol/L 104 103  CO2 20 - 29 mmol/L 22 28  Calcium 8.6 - 10.2 mg/dL 9.4 9.1  Total Protein 6.0 - 8.5 g/dL 6.5 -  Total Bilirubin 0.0 - 1.2 mg/dL 0.3 -  Alkaline Phos 39 - 117 IU/L 81 -  AST 0 - 40 IU/L 31 -  ALT 0 - 44 IU/L 26 -      Carmell Austria, MD 07/08/2019, 3:14 PM  Cc: [name], MD

## 2019-07-08 NOTE — Patient Instructions (Signed)
If you are age 77 or older, your body mass index should be between 23-30. Your Body mass index is 31.32 kg/m. If this is out of the aforementioned range listed, please consider follow up with your Primary Care Provider.  If you are age 49 or younger, your body mass index should be between 19-25. Your Body mass index is 31.32 kg/m. If this is out of the aformentioned range listed, please consider follow up with your Primary Care Provider.   You have been scheduled for a Barium Esophogram at [facility] (1st floor of the hospital) on 07/17/19 at 10:30am. Please arrive 15 minutes prior to your appointment for registration. Make certain not to have anything to eat or drink 3 hours prior to your test. If you need to reschedule for any reason, please contact radiology at [phone] to do so. __________________________________________________________________ A barium swallow is an examination that concentrates on views of the esophagus. This tends to be a double contrast exam (barium and two liquids which, when combined, create a gas to distend the wall of the oesophagus) or single contrast (non-ionic iodine based). The study is usually tailored to your symptoms so a good history is essential. Attention is paid during the study to the form, structure and configuration of the esophagus, looking for functional disorders (such as aspiration, dysphagia, achalasia, motility and reflux) EXAMINATION You may be asked to change into a gown, depending on the type of swallow being performed. A radiologist and radiographer will perform the procedure. The radiologist will advise you of the type of contrast selected for your procedure and direct you during the exam. You will be asked to stand, sit or lie in several different positions and to hold a small amount of fluid in your mouth before being asked to swallow while the imaging is performed .In some instances you may be asked to swallow barium coated marshmallows  to assess the motility of a solid food bolus. The exam can be recorded as a digital or video fluoroscopy procedure. POST PROCEDURE It will take 1-2 days for the barium to pass through your system. To facilitate this, it is important, unless otherwise directed, to increase your fluids for the next 24-48hrs and to resume your normal diet.  This test typically takes about 30 minutes to perform. __________________________________________________________________________________  Dennis Bast have been scheduled for an endoscopy. Please follow written instructions given to you at your visit today. If you use inhalers (even only as needed), please bring them with you on the day of your procedure. Your physician has requested that you go to www.startemmi.com and enter the access code given to you at your visit today. This web site gives a general overview about your procedure. However, you should still follow specific instructions given to you by our office regarding your preparation for the procedure.  We have sent the following medications to your pharmacy for you to pick up at your convenience: Protonix   Please purchase the following medications over the counter and take as directed: Miralax once daily.  Stool Softner twice daily.   Thank you,  Dr. Jackquline Denmark

## 2019-07-14 DIAGNOSIS — N3941 Urge incontinence: Secondary | ICD-10-CM | POA: Diagnosis not present

## 2019-07-15 ENCOUNTER — Encounter: Payer: Self-pay | Admitting: Student in an Organized Health Care Education/Training Program
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
## 2019-07-16 ENCOUNTER — Ambulatory Visit
Payer: Medicare Other | Attending: Student in an Organized Health Care Education/Training Program | Admitting: Student in an Organized Health Care Education/Training Program

## 2019-07-16 ENCOUNTER — Encounter: Payer: Self-pay | Admitting: Student in an Organized Health Care Education/Training Program

## 2019-07-16 ENCOUNTER — Other Ambulatory Visit: Payer: Self-pay

## 2019-07-16 VITALS — Ht 68.0 in | Wt 206.0 lb

## 2019-07-16 DIAGNOSIS — S134XXS Sprain of ligaments of cervical spine, sequela: Secondary | ICD-10-CM

## 2019-07-16 DIAGNOSIS — F0390 Unspecified dementia without behavioral disturbance: Secondary | ICD-10-CM

## 2019-07-16 DIAGNOSIS — M542 Cervicalgia: Secondary | ICD-10-CM

## 2019-07-16 DIAGNOSIS — M5412 Radiculopathy, cervical region: Secondary | ICD-10-CM

## 2019-07-16 DIAGNOSIS — G629 Polyneuropathy, unspecified: Secondary | ICD-10-CM

## 2019-07-16 DIAGNOSIS — M5030 Other cervical disc degeneration, unspecified cervical region: Secondary | ICD-10-CM | POA: Diagnosis not present

## 2019-07-16 DIAGNOSIS — G8921 Chronic pain due to trauma: Secondary | ICD-10-CM

## 2019-07-16 DIAGNOSIS — X58XXXS Exposure to other specified factors, sequela: Secondary | ICD-10-CM

## 2019-07-16 HISTORY — DX: Unspecified dementia, unspecified severity, without behavioral disturbance, psychotic disturbance, mood disturbance, and anxiety: F03.90

## 2019-07-16 HISTORY — DX: Chronic pain due to trauma: G89.21

## 2019-07-16 HISTORY — DX: Radiculopathy, cervical region: M54.12

## 2019-07-16 MED ORDER — GABAPENTIN 300 MG PO CAPS: ORAL_CAPSULE | ORAL | 1 refills | Status: DC

## 2019-07-17 ENCOUNTER — Other Ambulatory Visit: Payer: Self-pay

## 2019-07-17 ENCOUNTER — Ambulatory Visit (HOSPITAL_COMMUNITY)
Admission: RE | Admit: 2019-07-17 | Discharge: 2019-07-17 | Disposition: A | Discharge status: Home / Self Care | Payer: Medicare Other | Source: Ambulatory Visit | Attending: Gastroenterology | Admitting: Gastroenterology

## 2019-07-17 DIAGNOSIS — R1310 Dysphagia, unspecified: Secondary | ICD-10-CM | POA: Insufficient documentation

## 2019-07-17 NOTE — Progress Notes (Signed)
Inform patient about the results. He does have some degree of aspiration Proceed with speech evaluation Can also proceed with EGD with possible balloon dilatation if needed (due to small Zenker's diverticulum) RG

## 2019-07-20 ENCOUNTER — Other Ambulatory Visit: Payer: Self-pay | Admitting: Gastroenterology

## 2019-07-20 ENCOUNTER — Ambulatory Visit (INDEPENDENT_AMBULATORY_CARE_PROVIDER_SITE_OTHER): Payer: Medicare Other

## 2019-07-20 ENCOUNTER — Other Ambulatory Visit: Payer: Self-pay

## 2019-07-20 DIAGNOSIS — Z1159 Encounter for screening for other viral diseases: Secondary | ICD-10-CM

## 2019-07-21 ENCOUNTER — Other Ambulatory Visit: Payer: Self-pay

## 2019-07-21 DIAGNOSIS — G8921 Chronic pain due to trauma: Secondary | ICD-10-CM | POA: Diagnosis not present

## 2019-07-21 DIAGNOSIS — Z79891 Long term (current) use of opiate analgesic: Secondary | ICD-10-CM | POA: Diagnosis not present

## 2019-07-21 DIAGNOSIS — R1310 Dysphagia, unspecified: Secondary | ICD-10-CM

## 2019-07-21 LAB — SARS CORONAVIRUS 2 (TAT 6-24 HRS): SARS Coronavirus 2: NEGATIVE

## 2019-07-22 ENCOUNTER — Encounter: Payer: Self-pay | Admitting: Gastroenterology

## 2019-07-22 ENCOUNTER — Other Ambulatory Visit: Payer: Self-pay

## 2019-07-22 ENCOUNTER — Ambulatory Visit (AMBULATORY_SURGERY_CENTER): Payer: Medicare Other | Admitting: Gastroenterology

## 2019-07-22 VITALS — BP 100/70 | HR 57 | Temp 97.3°F | Resp 15 | Ht 68.0 in | Wt 206.0 lb

## 2019-07-22 DIAGNOSIS — K219 Gastro-esophageal reflux disease without esophagitis: Secondary | ICD-10-CM | POA: Diagnosis not present

## 2019-07-22 DIAGNOSIS — K449 Diaphragmatic hernia without obstruction or gangrene: Secondary | ICD-10-CM

## 2019-07-22 DIAGNOSIS — K228 Other specified diseases of esophagus: Secondary | ICD-10-CM | POA: Diagnosis not present

## 2019-07-22 DIAGNOSIS — R1310 Dysphagia, unspecified: Secondary | ICD-10-CM

## 2019-07-22 MED ORDER — SODIUM CHLORIDE 0.9 % IV SOLN: 500.0000 mL | Freq: Once | INTRAVENOUS | Status: DC

## 2019-07-22 NOTE — Progress Notes (Signed)
Pt tolerated well. VSS. Awake and to recovery. 

## 2019-07-22 NOTE — Progress Notes (Signed)
Called to room to assist during endoscopic procedure.  Patient ID and intended procedure confirmed with present staff. Received instructions for my participation in the procedure from the performing physician.  

## 2019-07-22 NOTE — Patient Instructions (Signed)
Follow the post-dilation diet today.  Make sure to chew food, especially meats and breads well and eat slowly.  Take all medicines in a standing position with 8 ounces of water.  Take your protonix 40 mg twice a day for 12 weeks, then you can reduce it to once a day after that.  YOU HAD AN ENDOSCOPIC PROCEDURE TODAY AT [facility]:   Refer to the procedure report that was given to you for any specific questions about what was found during the examination.  If the procedure report does not answer your questions, please call your gastroenterologist to clarify.  If you requested that your care partner not be given the details of your procedure findings, then the procedure report has been included in a sealed envelope for you to review at your convenience later.  YOU SHOULD EXPECT: Some feelings of bloating in the abdomen. Passage of more gas than usual.  Walking can help get rid of the air that was put into your GI tract during the procedure and reduce the bloating. If you had a lower endoscopy (such as a colonoscopy or flexible sigmoidoscopy) you may notice spotting of blood in your stool or on the toilet paper. If you underwent a bowel prep for your procedure, you may not have a normal bowel movement for a few days.  Please Note:  You might notice some irritation and congestion in your nose or some drainage.  This is from the oxygen used during your procedure.  There is no need for concern and it should clear up in a day or so.  SYMPTOMS TO REPORT IMMEDIATELY:   Following upper endoscopy (EGD)  Vomiting of blood or coffee ground material  New chest pain or pain under the shoulder blades  Painful or persistently difficult swallowing  New shortness of breath  Fever of 100F or higher  Black, tarry-looking stools  For urgent or emergent issues, a gastroenterologist can be reached at any hour by calling [phone].   DIET:  We do recommend a small meal at first, but then you  may proceed to your regular diet.  Drink plenty of fluids but you should avoid alcoholic beverages for 24 hours.  ACTIVITY:  You should plan to take it easy for the rest of today and you should NOT DRIVE or use heavy machinery until tomorrow (because of the sedation medicines used during the test).    FOLLOW UP: Our staff will call the number listed on your records 48-72 hours following your procedure to check on you and address any questions or concerns that you may have regarding the information given to you following your procedure. If we do not reach you, we will leave a message.  We will attempt to reach you two times.  During this call, we will ask if you have developed any symptoms of COVID 19. If you develop any symptoms (ie: fever, flu-like symptoms, shortness of breath, cough etc.) before then, please call [phone].  If you test positive for Covid 19 in the 2 weeks post procedure, please call and report this information to [place].    If any biopsies were taken you will be contacted by phone or by letter within the next 1-3 weeks.  Please call us at [phone] if you have not heard about the biopsies in 3 weeks.    SIGNATURES/CONFIDENTIALITY: You and/or your care partner have signed paperwork which will be entered into your electronic medical record.  These signatures attest to the  fact that that the information above on your After Visit Summary has been reviewed and is understood.  Full responsibility of the confidentiality of this discharge information lies with you and/or your care-partner.

## 2019-07-22 NOTE — Op Note (Addendum)
[name] Patient Name: [name] Procedure Date: 07/22/2019 10:03 AM MRN: [id number] Endoscopist: Jackquline Denmark , MD Age: 79 Referring MD:  Date of Birth: March 09, 1941 Gender: Male Account #: [id number] Procedure:                Upper GI endoscopy Indications:              #1. GERD with Esophageal dysphagia. H/O XRT to the                            neck.                           #2. 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                            Did require PEG at that time. Neg recent ENT eval                            (Dr Gaylyn Cheers). No further Onc FUs needed as per [facility] (Dr.                            Bobby Rumpf) Medicines:                Monitored Anesthesia Care Procedure:                Pre-Anesthesia Assessment:                           - Prior to the procedure, a History and Physical                            was performed, and patient medications and                            allergies were reviewed. The patient's tolerance of                            previous anesthesia was also reviewed. The risks                            and benefits of the procedure and the sedation                            options and risks were discussed with the patient.                            All questions were answered, and informed consent                            was obtained. Prior Anticoagulants: The patient has                            taken no previous anticoagulant or antiplatelet  agents. ASA Grade Assessment: III - A patient with                            severe systemic disease. After reviewing the risks                            and benefits, the patient was deemed in                            satisfactory condition to undergo the procedure.                           After obtaining informed consent, the endoscope was                            passed under direct vision. Throughout the                            procedure, the  patient's blood pressure, pulse, and                            oxygen saturations were monitored continuously. The                            Endoscope was introduced through the mouth, and                            advanced to the second part of duodenum. The upper                            GI endoscopy was accomplished without difficulty.                            The patient tolerated the procedure well. Scope In: Scope Out: Findings:                 Incidental inlet patch was noted in the proximal                            esophagus. There was no Zenker's diverticulum                            noted. The esophagus was mildly tortuous. Benign                            distal esophageal stricture was visualized with                            luminal diameter of approx14 mm, just above the GE                            junction, 35 cm from the incisors with LA grade B  esophagitis (one or more mucosal breaks greater                            than 5 mm, not extending between the tops of two                            mucosal folds). Multiple biopsies were obtained and                            sent for histology. Dilation was performed with a                            Maloney dilator with mild resistance at 50 Fr. The                            dilation site was examined following endoscope                            reinsertion and showed mild mucosal disruption and                            moderate improvement in luminal narrowing.                           A small hiatal hernia was present.                           Localized mild inflammation characterized by                            erythema was found in the gastric antrum. Biopsies                            were taken with a cold forceps for histology. A                            scar was noted in the body of the stomach                            consistent with previous PEG tube.                            The examined duodenum was normal. Complications:            No immediate complications. Estimated Blood Loss:     Estimated blood loss: none. Impression:               - Benign-appearing esophageal stenosis with                            surrounding esophagitis. Biopsied and dilated.                           - Small hiatal hernia.                           -  Gastritis. Biopsied. Recommendation:           - Post dilatation diet.                           - Protonix 40 mg p.o. twice daily x 12 weeks, then                            can reduce it to once a day.                           - Await pathology results.                           - Chew food especially meats and breads well and                            eat slowly. Take all medicines in standing position                            with 8 ounces of water.                           - Return to GI clinic in 12 weeks.                           - The findings and recommendations were discussed                            with the patient's family. Jackquline Denmark, MD 07/22/2019 10:27:14 AM This report has been signed electronically.

## 2019-07-23 LAB — COMPLIANCE DRUG ANALYSIS, UR

## 2019-07-24 ENCOUNTER — Telehealth: Payer: Self-pay

## 2019-07-24 ENCOUNTER — Telehealth: Payer: Self-pay | Admitting: *Deleted

## 2019-07-24 NOTE — Telephone Encounter (Signed)
  Follow up Call-  Call back number 07/22/2019  Post procedure Call Back phone  # [phone]  Permission to leave phone message Yes  Some recent data might be hidden     Left message

## 2019-07-24 NOTE — Telephone Encounter (Signed)
  Follow up Call-  Call back number 07/22/2019  Post procedure Call Back phone  # [phone]  Permission to leave phone message Yes  Some recent data might be hidden     Patient questions:  Message left to call us if necessary.

## 2019-07-26 ENCOUNTER — Encounter: Payer: Self-pay | Admitting: Gastroenterology

## 2019-07-27 ENCOUNTER — Encounter: Payer: Self-pay | Admitting: Student in an Organized Health Care Education/Training Program

## 2019-07-27 ENCOUNTER — Other Ambulatory Visit: Payer: Self-pay

## 2019-07-27 ENCOUNTER — Telehealth: Payer: Self-pay | Admitting: Student in an Organized Health Care Education/Training Program

## 2019-07-27 ENCOUNTER — Ambulatory Visit
Payer: Medicare Other | Attending: Student in an Organized Health Care Education/Training Program | Admitting: Student in an Organized Health Care Education/Training Program

## 2019-07-27 VITALS — Ht 68.0 in | Wt 206.0 lb

## 2019-07-27 DIAGNOSIS — S134XXS Sprain of ligaments of cervical spine, sequela: Secondary | ICD-10-CM

## 2019-07-27 DIAGNOSIS — M542 Cervicalgia: Secondary | ICD-10-CM | POA: Diagnosis not present

## 2019-07-27 DIAGNOSIS — G629 Polyneuropathy, unspecified: Secondary | ICD-10-CM

## 2019-07-27 DIAGNOSIS — M5412 Radiculopathy, cervical region: Secondary | ICD-10-CM

## 2019-07-27 DIAGNOSIS — M5030 Other cervical disc degeneration, unspecified cervical region: Secondary | ICD-10-CM

## 2019-07-27 DIAGNOSIS — F0390 Unspecified dementia without behavioral disturbance: Secondary | ICD-10-CM | POA: Diagnosis not present

## 2019-07-27 DIAGNOSIS — G8921 Chronic pain due to trauma: Secondary | ICD-10-CM

## 2019-07-27 MED ORDER — GABAPENTIN 300 MG PO CAPS: ORAL_CAPSULE | ORAL | 2 refills | Status: DC

## 2019-07-27 MED ORDER — TRAMADOL HCL 50 MG PO TABS: 100.0000 mg | ORAL_TABLET | Freq: Two times a day (BID) | ORAL | 0 refills | Status: DC | PRN

## 2019-07-27 NOTE — Telephone Encounter (Signed)
Pt's wife called and stated that pt did his drug screen and that Dr Holley Raring said that when the pt did that and everything came back normal that he would send in RX for tramadol and gabapentin. pt would like those sent to [facility] on [address] in [place]

## 2019-07-27 NOTE — Telephone Encounter (Signed)
Notes do not indicate this. Do you want him to make appt or do you want to send these scripts in?

## 2019-07-27 NOTE — Progress Notes (Signed)
Patient: [name]  Service Category: E/M  Provider: Gillis Santa, MD  DOB: 1941-01-03  DOS: 07/27/2019  Location: Office  MRN: [id number]  Setting: Ambulatory outpatient  Referring Provider: [name], MD  Type: Established Patient  Specialty: Interventional Pain Management  PCP: [name], MD  Location: Home  Delivery: TeleHealth     Virtual Encounter - Pain Management PROVIDER NOTE: Information contained herein reflects review and annotations entered in association with encounter. Interpretation of such information and data should be left to medically-trained personnel. Information provided to patient can be located elsewhere in the medical record under "Patient Instructions". Document created using STT-dictation technology, any transcriptional errors that may result from process are unintentional.    Contact & Pharmacy Preferred: [phone] Home: [phone] (home) Mobile: [phone] (mobile) E-mail: No e-mail address on record  [place], [place] - [place] [address] [place] [address] Phone: [phone] Fax: [phone]   Pre-screening note:  Our staff contacted [name] and offered him an "in person", "face-to-face" appointment versus a telephone encounter. He indicated preferring the telephone encounter, at this time.   Primary Reason(s) for Virtual Visit: Encounter for evaluation before starting new chronic pain management plan of care (Level of risk: moderate) COVID-19*  Social distancing based on CDC ans AMA recommendations.    I contacted [name] on 07/27/2019 via telephone.      I clearly identified myself as Gillis Santa, MD. I verified that I was speaking with the correct person using two identifiers (Name: [name], and date of birth: 06/16/1940).  This visit was completed via telephone due to the restrictions of the COVID-19 pandemic. All issues as above were discussed and  addressed but no physical exam was performed. If it was felt that the patient should be evaluated in the office, they were directed there. The patient verbally consented to this visit. Patient was unable to complete an audio/visual visit due to Technical difficulties and/or Lack of internet. Due to the catastrophic nature of the COVID-19 pandemic, this visit was done through audio contact only.  Location of the patient: home address (see Epic for details)  Location of the provider: office  Advanced Informed Consent I sought verbal advanced consent from [name] for virtual visit interactions. I informed [name] of possible security and privacy concerns, risks, and limitations associated with providing "not-in-person" medical evaluation and management services. I also informed [name] of the availability of "in-person" appointments. Finally, I informed him that there would be a charge for the virtual visit and that he could be  personally, fully or partially, financially responsible for it. [name] expressed understanding and agreed to proceed.   Historic Elements   [name] is a 79 y.o. year old, male patient evaluated today after his last encounter by our practice on 07/27/2019. 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 He also  has a past surgical history that includes Tonsillectomy; Inguinal hernia repair; Eye surgery; Cardiac electrophysiology study and ablation; Prostate biopsy; SEBACEOUS CYST REMOVAL; Esophagogastroduodenoscopy (06/15/2010); and Colonoscopy (04/07/2007). [name] has a current medication list which includes the following prescription(s): amlodipine,  clonidine, furosemide, gabapentin, levothyroxine, lidocaine, metoprolol succinate, pantoprazole, simvastatin, tramadol, trazodone, and  tapentadol. He  reports that he quit smoking about 10 years ago. He has never used smokeless tobacco. He reports previous alcohol use. He reports that he does not use drugs. [name] is allergic to trileptal [oxcarbazepine].   HPI  2nd patient visit (see initial clinic) UDS completed and appropriate Patient is obtaining benefit with tramadol 100 mg twice daily.  He states that it does help him sleep better. He states that his mood is also improved.  Controlled Substance Pharmacotherapy Assessment REMS (Risk Evaluation and Mitigation Strategy)  Analgesic: Tramadol 100 mg twice daily as needed, quantity 120/month to start today Monitoring: [place] PMP: PDMP reviewed during this encounter.       Not applicable at this point since we have not taken over the patient's medication management yet. List of other Serum/Urine Drug Screening Test(s):  No results found for: AMPHSCRSER, BARBSCRSER, BENZOSCRSER, COCAINSCRSER, COCAINSCRNUR, PCPSCRSER, THCSCRSER, THCU, CANNABQUANT, [place], [place], [place], [place] List of all UDS test(s) done:  Lab Results  Component Value Date   SUMMARY Note 07/21/2019   Last UDS on record: Summary  Date Value Ref Range Status  07/21/2019 Note  Final    Comment:    ==================================================================== Compliance Drug Analysis, Ur ==================================================================== Test                             Result       Flag       Units Drug Present and Declared for Prescription Verification   Tramadol                       1940         EXPECTED   ng/mg creat   O-Desmethyltramadol            >5435        EXPECTED   ng/mg creat   N-Desmethyltramadol            775          EXPECTED   ng/mg creat    Source of tramadol is a prescription medication. O-desmethyltramadol    and  N-desmethyltramadol are expected metabolites of tramadol.   Gabapentin                     PRESENT      EXPECTED Drug Present not Declared for Prescription Verification   Acetaminophen                  PRESENT      UNEXPECTED Drug Absent but Declared for Prescription Verification   Trazodone                      Not Detected UNEXPECTED   Lidocaine                      Not Detected UNEXPECTED    Lidocaine, as indicated in the declared medication list, is not    always detected even when used as directed.   Metoprolol                     Not Detected UNEXPECTED ==================================================================== Test                      Result    Flag   Units      Ref Range   Creatinine  92               mg/dL      >=20 ==================================================================== Declared Medications:  The flagging and interpretation on this report are based on the  following declared medications.  Unexpected results may arise from  inaccuracies in the declared medications.  **Note: The testing scope of this panel includes these medications:  Gabapentin  Metoprolol  Tramadol  Trazodone  **Note: The testing scope of this panel does not include small to  moderate amounts of these reported medications:  Lidocaine  **Note: The testing scope of this panel does not include the  following reported medications:  Amlodipine  Furosemide  Levothyroxine  Pantoprazole  Simvastatin ==================================================================== For clinical consultation, please call [phone]. ====================================================================    UDS interpretation: No unexpected findings.          Medication Assessment Form: Patient introduced to form today (will sign at next visit) Treatment compliance: Treatment may start today if patient agrees with proposed plan. Evaluation of compliance is not applicable at this  point Risk Assessment Profile: Aberrant behavior: See initial evaluations. None observed or detected today Comorbid factors increasing risk of overdose: See initial evaluation. No additional risks detected today Opioid risk tool (ORT):  Opioid Risk  07/15/2019  Alcohol 3  Illegal Drugs 0  Rx Drugs 0  Alcohol 0  Illegal Drugs 0  Rx Drugs 0  Age between 16-45 years  0  Psychological Disease 2  ADD Negative  OCD Negative  Bipolar Positive  Depression 1  Opioid Risk Tool Scoring 6  Opioid Risk Interpretation Moderate Risk    ORT Scoring interpretation table:  Score <3 = Low Risk for SUD  Score between 4-7 = Moderate Risk for SUD  Score >8 = High Risk for Opioid Abuse   Risk of substance use disorder (SUD): Low  Risk Mitigation Strategies:  Patient opioid safety counseling: Completed today. Counseling provided to patient as per "Patient Counseling Document". Document signed by patient, attesting to counseling and understanding (verbally reviewed with patient) Patient-Prescriber Agreement (PPA): Obtained today.  Verbally obtained, patient will sign at next visit Controlled substance notification to other providers: Written and sent today.  Pharmacologic Plan: Today we may be taking over the patient's pharmacological regimen. See below.             Meds   Current Outpatient Medications:  .  amLODipine (NORVASC) 10 MG tablet, Take 10 mg by mouth daily., Disp: , Rfl:  .  cloNIDine (CATAPRES) 0.1 MG tablet, , Disp: , Rfl:  .  furosemide (LASIX) 20 MG tablet, Take 20 mg by mouth daily as needed., Disp: , Rfl:  .  gabapentin (NEURONTIN) 300 MG capsule, 300 mg morning, 300 mg afternoon, 600 mg qhs, Disp: 120 capsule, Rfl: 2 .  levothyroxine (SYNTHROID) 88 MCG tablet, Take 88 mcg by mouth daily., Disp: , Rfl:  .  Lidocaine 4 % GEL, OTC PRN, Disp: , Rfl:  .  metoprolol succinate (TOPROL-XL) 25 MG 24 hr tablet, Take 25 mg by mouth daily., Disp: , Rfl:  .  pantoprazole (PROTONIX) 40 MG  tablet, Take 1 tablet (40 mg total) by mouth 2 (two) times daily., Disp: 60 tablet, Rfl: 11 .  simvastatin (ZOCOR) 40 MG tablet, simvastatin 40 mg tablet  TAKE 1 TABLET BY MOUTH ONCE DAILY, Disp: , Rfl:  .  traMADol (ULTRAM) 50 MG tablet, Take 2 tablets (100 mg total) by mouth every 12 (twelve) hours as needed., Disp: 120 tablet, Rfl: 0 .  traZODone (DESYREL) 50 MG tablet, Take 50 mg by mouth at bedtime as needed., Disp: , Rfl:  .  tapentadol (NUCYNTA) 50 MG tablet, Nucynta 50 mg tablet, Disp: , Rfl:   Laboratory Chemistry Profile   Renal Lab Results  Component Value Date   BUN 19 12/16/2018   CREATININE 1.45 (H) 12/16/2018   BCR 13 12/16/2018   GFRAA 54 (L) 12/16/2018   GFRNONAA 46 (L) 12/16/2018    Electrolytes Lab Results  Component Value Date   NA 141 12/16/2018   K 4.6 12/16/2018   CL 104 12/16/2018   CALCIUM 9.4 12/16/2018    Hepatic Lab Results  Component Value Date   AST 31 12/16/2018   ALT 26 12/16/2018   ALBUMIN 4.6 12/16/2018   ALKPHOS 81 12/16/2018    ID Lab Results  Component Value Date   SARSCOV2NAA RESULT:  NEGATIVE 07/20/2019    Bone Lab Results  Component Value Date   VD25OH 35.5 12/16/2018    Endocrine Lab Results  Component Value Date   GLUCOSE 89 12/16/2018   HGBA1C 5.8 (H) 12/16/2018   TSH 2.850 12/16/2018    Neuropathy Lab Results  Component Value Date   VITAMINB12 419 12/16/2018   HGBA1C 5.8 (H) 12/16/2018    CNS No results found for: COLORCSF, APPEARCSF, RBCCOUNTCSF, WBCCSF, POLYSCSF, LYMPHSCSF, EOSCSF, PROTEINCSF, GLUCCSF, JCVIRUS, CSFOLI, IGGCSF, LABACHR, ACETBL  Inflammation (CRP: Acute  ESR: Chronic) Lab Results  Component Value Date   CRP 4 12/16/2018   ESRSEDRATE 3 12/16/2018    Rheumatology Lab Results  Component Value Date   ANA Negative 12/16/2018    Coagulation Lab Results  Component Value Date   PLT 182 12/16/2018    Cardiovascular Lab Results  Component Value Date   CKTOTAL 568 (HH) 12/16/2018   HGB 14.6  12/16/2018   HCT 41.7 12/16/2018    Screening Lab Results  Component Value Date   SARSCOV2NAA RESULT:  NEGATIVE 07/20/2019    Cancer No results found for: CEA, CA125, LABCA2  Note: Lab results reviewed.    Assessment  The primary encounter diagnosis was Chronic pain due to trauma. Diagnoses of Cervicalgia, Whiplash injury syndrome, sequela, Cervical radicular pain, Degenerative disc disease, cervical, Dementia without behavioral disturbance, unspecified dementia type ([place]), and Small fiber neuropathy were also pertinent to this visit.   Received records from Dr. Nelva Bush.  MR cervical spine 08/16/2018: C5-C6 moderate by foraminal stenosis secondary to disc narrowing with mild disc bulge and endplate osteophytes, K0-U5 moderate left foraminal stenosis secondary to disc space narrowing, facet arthropathy and 4 mm anterolisthesis of C4 with uncovered disc bulge, left facet effusion  MRI 08/18/2018: Mild L3-L4 spinal canal stenosis with narrowing of subarticular zones, moderate disc bulge to the right, moderate to severe right facet arthrosis r L4/5 and L5/S1  Status post C4-C5 and C5-C6 facet medial branch nerve block 1 05/12/2019: Not helpful. Status post C7-T1 ESI on 04/02/2019, helped for couple of weeks.  Today was a patient second visit with me.  His urine toxicology screen is appropriate and unremarkable.  He is finding benefit with change in his tramadol dosing which he is taking 100 mg twice daily.  At the last visit, we also increase his gabapentin to a nighttime dose of 600 mg nightly and kept the morning dose at 300 mg in the afternoon dose at 300 mg.   Plan of Care  I have changed Quin Hoop. Spruiell [name]'s traMADol. I am also having him maintain his levothyroxine, amLODipine, metoprolol succinate, simvastatin, Lidocaine,  pantoprazole, furosemide, traZODone, cloNIDine, tapentadol, and gabapentin. Pharmacotherapy (Medications Ordered): Meds ordered this encounter  Medications  . traMADol  (ULTRAM) 50 MG tablet    Sig: Take 2 tablets (100 mg total) by mouth every 12 (twelve) hours as needed.    Dispense:  120 tablet    Refill:  0    For chronic pain syndrome  . gabapentin (NEURONTIN) 300 MG capsule    Sig: 300 mg morning, 300 mg afternoon, 600 mg qhs    Dispense:  120 capsule    Refill:  2   Interventional management options:  Done with Dr Kalman Jewels PM&R Status post C4-C5 and C5-C6 facet medial branch nerve block 1 05/12/2019: Not helpful. Status post C7-T1 ESI on 04/02/2019, helped for couple of weeks.   Total duration of non-face-to-face encounter: 68mnutes.  Follow-up plan:   Return in about 4 weeks (around 08/24/2019) for Medication Management, in person (pain contract).    Recent Visits Date Type Provider Dept  07/16/19 Office Visit LGillis Santa MD [facility]  Showing recent visits within past 90 days and meeting all other requirements   Today's Visits Date Type Provider Dept  07/27/19 Office Visit LGillis Santa MD [facility]  Showing today's visits and meeting all other requirements   Future Appointments Date Type Provider Dept  08/20/19 Appointment LGillis Santa MD [facility]  Showing future appointments within next 90 days and meeting all other requirements   Primary Care Physician: HRonita Hipps MD Location: Telephone Virtual Visit Note by: BGillis Santa MD Date: 07/27/2019; Time: 3:14 PM  Note: This dictation was prepared with Dragon dictation. Any transcriptional errors that may result from this process are unintentional.  Disclaimer:  * Given the special circumstances of the COVID-19 pandemic, the federal government has announced that the Office for Civil Rights (OCR) will exercise its enforcement discretion and will not impose penalties on physicians using telehealth in the event of noncompliance with regulatory requirements under the [place]IPAA) in  connection with the good faith provision of telehealth during the CXFQHK-25national public health emergency. ([place]

## 2019-08-03 DIAGNOSIS — N3946 Mixed incontinence: Secondary | ICD-10-CM | POA: Diagnosis not present

## 2019-08-03 DIAGNOSIS — N3941 Urge incontinence: Secondary | ICD-10-CM | POA: Diagnosis not present

## 2019-08-03 DIAGNOSIS — R351 Nocturia: Secondary | ICD-10-CM | POA: Diagnosis not present

## 2019-08-19 DIAGNOSIS — Z23 Encounter for immunization: Secondary | ICD-10-CM | POA: Diagnosis not present

## 2019-08-20 ENCOUNTER — Encounter: Payer: Medicare Other | Admitting: Student in an Organized Health Care Education/Training Program

## 2019-08-24 ENCOUNTER — Ambulatory Visit
Payer: Medicare Other | Attending: Student in an Organized Health Care Education/Training Program | Admitting: Student in an Organized Health Care Education/Training Program

## 2019-08-24 ENCOUNTER — Other Ambulatory Visit: Payer: Self-pay

## 2019-08-24 ENCOUNTER — Encounter: Payer: Self-pay | Admitting: Student in an Organized Health Care Education/Training Program

## 2019-08-24 VITALS — BP 103/78 | HR 133 | Temp 97.3°F | Resp 16 | Ht 68.0 in | Wt 199.0 lb

## 2019-08-24 DIAGNOSIS — G8921 Chronic pain due to trauma: Secondary | ICD-10-CM | POA: Diagnosis not present

## 2019-08-24 DIAGNOSIS — M542 Cervicalgia: Secondary | ICD-10-CM

## 2019-08-24 DIAGNOSIS — S134XXS Sprain of ligaments of cervical spine, sequela: Secondary | ICD-10-CM | POA: Diagnosis not present

## 2019-08-24 DIAGNOSIS — F0390 Unspecified dementia without behavioral disturbance: Secondary | ICD-10-CM | POA: Diagnosis not present

## 2019-08-24 DIAGNOSIS — M5030 Other cervical disc degeneration, unspecified cervical region: Secondary | ICD-10-CM

## 2019-08-24 DIAGNOSIS — G629 Polyneuropathy, unspecified: Secondary | ICD-10-CM | POA: Diagnosis not present

## 2019-08-24 DIAGNOSIS — M5412 Radiculopathy, cervical region: Secondary | ICD-10-CM

## 2019-08-24 MED ORDER — BUPRENORPHINE 5 MCG/HR TD PTWK: 1.0000 | MEDICATED_PATCH | TRANSDERMAL | 1 refills | Status: DC

## 2019-08-24 MED ORDER — GABAPENTIN 300 MG PO CAPS: 600.0000 mg | ORAL_CAPSULE | Freq: Three times a day (TID) | ORAL | 1 refills | Status: DC

## 2019-08-24 MED ORDER — BUPRENORPHINE 5 MCG/HR TD PTWK: 1.0000 | MEDICATED_PATCH | TRANSDERMAL | 1 refills | Status: AC

## 2019-08-24 NOTE — Progress Notes (Signed)
PROVIDER NOTE: Information contained herein reflects review and annotations entered in association with encounter. Interpretation of such information and data should be left to medically-trained personnel. Information provided to patient can be located elsewhere in the medical record under "Patient Instructions". Document created using STT-dictation technology, any transcriptional errors that may result from process are unintentional.    Patient: [name]  Service Category: E/M  Provider: Gillis Santa, MD  DOB: 1940/10/03  DOS: 08/24/2019  Referring Provider: [name], MD  MRN: [id number]  Setting: Ambulatory outpatient  PCP: [name], MD  Type: Established Patient  Specialty: Interventional Pain Management    Location: Office  Delivery: Face-to-face     Primary Reason(s) for Visit: Encounter for prescription drug management. (Level of risk: moderate)  CC: Back Pain (lumbar middle ), Neck Pain (middle), Abdominal Pain (just above the umbilicus ), Ankle Pain (bilateral), and Foot Pain (bilateral, peripheral neuropathy )  HPI  [name] is a 79 y.o. year old, male patient, who comes today for a medication management evaluation. He has Small fiber neuropathy; Chronic pain due to trauma; Whiplash injury syndrome, sequela; Cervical radicular pain; Degenerative disc disease, cervical; and Dementia without behavioral disturbance (HCC) on their problem list. His primarily concern today is the Back Pain (lumbar middle ), Neck Pain (middle), Abdominal Pain (just above the umbilicus ), Ankle Pain (bilateral), and Foot Pain (bilateral, peripheral neuropathy )  Pain Assessment: Location: Left, Right Foot(see visit info for additional sites.) Radiating: neck pain goes into right shoulder and down to the elbow.  back pain into the buttocks and up above the waist Onset: More than a month ago Duration: Chronic pain Quality: Discomfort, Aching, Sharp, Constant Severity: 9 /10 (subjective,  self-reported pain score)  Effect on ADL: Does what he needs to do and then pays for it later by increased pain. Timing: Constant Modifying factors: gabapentin helped a lot in the beginning but not helping as much now.  Tramadol is not handling the pain. BP: 103/78  HR: (!) 133  [name] was last scheduled for an appointment on 07/27/2019 for medication management. During today's appointment we reviewed [name] chronic pain status, as well as his outpatient medication regimen.  Multiple pain complaints as noted above.  Most pronounced in his bilateral feet, low back.  Patient does have small fiber neuropathy that contributes to his lower extremity pain.  He also has a history of dementia.  At his last visit, his tramadol was increased to 100 mg twice daily which per his wife states is not providing any significant pain relief.  Patient is finding some benefit at the higher dose of gabapentin which currently is 300 mg during the day, 300 mg in the afternoon, 600 mg nightly.  Patient and his wife both deny any sedation during the day as a result of gabapentin intake.  He denies any lower extremity swelling.  Patient does endorse mild hallucinations at times.  We will have him discontinue tramadol.  We discussed the addition of buprenorphine which is a long-acting analgesic.  Risks of this medication were discussed in great detail.  Patient would like to proceed.  The patient  reports no history of drug use. His body mass index is 30.26 kg/m.  Further details on both, my assessment(s), as well as the proposed treatment plan, please see below.  Controlled Substance Pharmacotherapy Assessment REMS (Risk Evaluation and Mitigation Strategy)  Analgesic: Discontinue tramadol, start Butrans 5 mcg an hour  Janett Billow, RN  08/24/2019  1:15 PM  Sign when Signing Visit Safety precautions to be maintained throughout the outpatient stay will include: orient to surroundings, keep bed in low  position, maintain call bell within reach at all times, provide assistance with transfer out of bed and ambulation.    Pharmacokinetics: Liberation and absorption (onset of action): WNL Distribution (time to peak effect): WNL Metabolism and excretion (duration of action): WNL         Pharmacodynamics: Desired effects: Analgesia: Mr. Remer reports <50% benefit with tramadol Functional ability: Patient reports being unable to accomplish basic ADLs with tramadol Clinically meaningful improvement in function (CMIF): Medication does not meet basic CMIF with tramadol Perceived effectiveness: Described as ineffective and would like to make some changes Undesirable effects: Side-effects or Adverse reactions: None reported Monitoring: [place] PMP: PDMP not reviewed this encounter. Online review of the past 59-monthperiod conducted. Compliant with practice rules and regulations Last UDS on record: Summary  Date Value Ref Range Status  07/21/2019 Note  Final    Comment:    ==================================================================== Compliance Drug Analysis, Ur ==================================================================== Test                             Result       Flag       Units Drug Present and Declared for Prescription Verification   Tramadol                       1938         EXPECTED   ng/mg creat   O-Desmethyltramadol            >5435        EXPECTED   ng/mg creat   N-Desmethyltramadol            775          EXPECTED   ng/mg creat    Source of tramadol is a prescription medication. O-desmethyltramadol    and N-desmethyltramadol are expected metabolites of tramadol.   Gabapentin                     PRESENT      EXPECTED Drug Present not Declared for Prescription Verification   Acetaminophen                  PRESENT      UNEXPECTED Drug Absent but Declared for Prescription Verification   Trazodone                      Not Detected UNEXPECTED   Lidocaine                       Not Detected UNEXPECTED    Lidocaine, as indicated in the declared medication list, is not    always detected even when used as directed.   Metoprolol                     Not Detected UNEXPECTED ==================================================================== Test                      Result    Flag   Units      Ref Range   Creatinine              92               mg/dL      >=  20 ==================================================================== Declared Medications:  The flagging and interpretation on this report are based on the  following declared medications.  Unexpected results may arise from  inaccuracies in the declared medications.  **Note: The testing scope of this panel includes these medications:  Gabapentin  Metoprolol  Tramadol  Trazodone  **Note: The testing scope of this panel does not include small to  moderate amounts of these reported medications:  Lidocaine  **Note: The testing scope of this panel does not include the  following reported medications:  Amlodipine  Furosemide  Levothyroxine  Pantoprazole  Simvastatin ==================================================================== For clinical consultation, please call [phone]. ====================================================================    UDS interpretation: Compliant          Medication Assessment Form: Reviewed. Patient indicates being compliant with therapy Treatment compliance: Compliant Risk Assessment Profile: Aberrant behavior: See initial evaluations. None observed or detected today Comorbid factors increasing risk of overdose: See initial evaluation. No additional risks detected today Opioid risk tool (ORT):  Opioid Risk  07/15/2019  Alcohol 3  Illegal Drugs 0  Rx Drugs 0  Alcohol 0  Illegal Drugs 0  Rx Drugs 0  Age between 16-45 years  0  Psychological Disease 2  ADD Negative  OCD Negative  Bipolar Positive  Depression 1  Opioid Risk Tool Scoring 6  Opioid  Risk Interpretation Moderate Risk    ORT Scoring interpretation table:  Score <3 = Low Risk for SUD  Score between 4-7 = Moderate Risk for SUD  Score >8 = High Risk for Opioid Abuse   Risk of substance use disorder (SUD): Low  Risk Mitigation Strategies:  Patient Counseling: Covered Patient-Prescriber Agreement (PPA): Present and active  Notification to other healthcare providers: Done  Pharmacologic Plan: Discontinue tramadol.  Start Butrans patch as below.  Increase gabapentin to 600 mg 3 times daily.             Laboratory Chemistry Profile   Renal Lab Results  Component Value Date   BUN 19 12/16/2018   CREATININE 1.45 (H) 12/16/2018   BCR 13 12/16/2018   GFRAA 54 (L) 12/16/2018   GFRNONAA 46 (L) 12/16/2018    Electrolytes Lab Results  Component Value Date   NA 141 12/16/2018   K 4.6 12/16/2018   CL 104 12/16/2018   CALCIUM 9.4 12/16/2018    Hepatic Lab Results  Component Value Date   AST 31 12/16/2018   ALT 26 12/16/2018   ALBUMIN 4.6 12/16/2018   ALKPHOS 81 12/16/2018    ID Lab Results  Component Value Date   SARSCOV2NAA RESULT:  NEGATIVE 07/20/2019    Bone Lab Results  Component Value Date   VD25OH 35.5 12/16/2018    Endocrine Lab Results  Component Value Date   GLUCOSE 89 12/16/2018   HGBA1C 5.8 (H) 12/16/2018   TSH 2.850 12/16/2018    Neuropathy Lab Results  Component Value Date   VITAMINB12 419 12/16/2018   HGBA1C 5.8 (H) 12/16/2018    CNS No results found for: COLORCSF, APPEARCSF, RBCCOUNTCSF, WBCCSF, POLYSCSF, LYMPHSCSF, EOSCSF, PROTEINCSF, GLUCCSF, JCVIRUS, CSFOLI, IGGCSF, LABACHR, ACETBL, LABACHR, ACETBL  Inflammation (CRP: Acute  ESR: Chronic) Lab Results  Component Value Date   CRP 4 12/16/2018   ESRSEDRATE 3 12/16/2018    Rheumatology Lab Results  Component Value Date   ANA Negative 12/16/2018    Coagulation Lab Results  Component Value Date   PLT 182 12/16/2018    Cardiovascular Lab Results  Component Value Date    CKTOTAL 568 ([place]) 12/16/2018  HGB 14.6 12/16/2018   HCT 41.7 12/16/2018    Screening Lab Results  Component Value Date   SARSCOV2NAA RESULT:  NEGATIVE 07/20/2019    Cancer No results found for: CEA, CA125, LABCA2  Allergens No results found for: ALMOND, APPLE, ASPARAGUS, AVOCADO, BANANA, BARLEY, BASIL, BAYLEAF, GREENBEAN, LIMABEAN, WHITEBEAN, BEEFIGE, REDBEET, BLUEBERRY, BROCCOLI, CABBAGE, MELON, CARROT, CASEIN, CASHEWNUT, CAULIFLOWER, CELERY    Note: Lab results reviewed.   Recent Diagnostic Imaging Results  DG ESOPHAGUS W DOUBLE CM (HD) CLINICAL DATA:  Esophageal dysphagia.  Odynophagia.  EXAM: ESOPHOGRAM / BARIUM SWALLOW / BARIUM TABLET STUDY  TECHNIQUE: Combined double contrast and single contrast examination performed using effervescent crystals, thick barium liquid, and thin barium liquid. The patient was observed with fluoroscopy swallowing a 13 mm barium sulphate tablet.  FLUOROSCOPY TIME:  Fluoroscopy Time:  2 minutes and 12 seconds  Radiation Exposure Index (if provided by the fluoroscopic device): 27.8  Number of Acquired Spot Images: 0  COMPARISON:  Chest CT of 12/14/2017 from [facility].  FINDINGS: Evaluation of the hypopharynx demonstrates a prominent cricopharyngeus muscle and a pre cricoid impression. Small Zenker's diverticulum.  Double contrast evaluation of the esophagus demonstrates no mucosal abnormality.  During the double contrast portion exam, it became apparent that the patient was having silent aspiration. Therefore, decision was made to not lay patient prone or evaluate primary peristalsis. Full column evaluation was performed with the patient upright.  Given this mild limitation, no evidence of esophageal stricture identified. No hiatal hernia.  A 13 mm barium tablet passes promptly.  IMPRESSION: 1. Silent aspiration with successive swallows during double-contrast portion of exam. 2. Secondary to this fact, the study was  performed entirely upright and esophageal peristalsis was not evaluated. 3. Given this limitation, no evidence of esophageal stricture or anatomic cause for patient's symptoms identified. 4. Small Zenker's diverticulum. 5. Consider speech pathology evaluation.  These results will be called to the ordering clinician or representative by the Radiologist Assistant, and communication documented in the PACS or zVision Dashboard.  Electronically Signed   By: Abigail Miyamoto M.D.   On: 07/17/2019 11:06  Complexity Note: Imaging results reviewed. Results shared with [name], using Layman's terms.                               Meds   Current Outpatient Medications:  .  amLODipine (NORVASC) 10 MG tablet, Take 10 mg by mouth daily., Disp: , Rfl:  .  cloNIDine (CATAPRES) 0.1 MG tablet, , Disp: , Rfl:  .  furosemide (LASIX) 20 MG tablet, Take 20 mg by mouth daily as needed., Disp: , Rfl:  .  gabapentin (NEURONTIN) 300 MG capsule, Take 2 capsules (600 mg total) by mouth 3 (three) times daily., Disp: 180 capsule, Rfl: 1 .  levothyroxine (SYNTHROID) 88 MCG tablet, Take 88 mcg by mouth daily., Disp: , Rfl:  .  metoprolol succinate (TOPROL-XL) 25 MG 24 hr tablet, Take 25 mg by mouth daily., Disp: , Rfl:  .  pantoprazole (PROTONIX) 40 MG tablet, Take 1 tablet (40 mg total) by mouth 2 (two) times daily., Disp: 60 tablet, Rfl: 11 .  simvastatin (ZOCOR) 40 MG tablet, simvastatin 40 mg tablet  TAKE 1 TABLET BY MOUTH ONCE DAILY, Disp: , Rfl:  .  traZODone (DESYREL) 50 MG tablet, Take 50 mg by mouth at bedtime as needed., Disp: , Rfl:  .  buprenorphine (BUTRANS) 5 MCG/HR PTWK, Place 1 patch onto the  skin every 7 (seven) days. For chronic pain syndrome., Disp: 4 patch, Rfl: 1 .  Lidocaine 4 % GEL, OTC PRN, Disp: , Rfl:   ROS  Constitutional: Denies any fever or chills Gastrointestinal: No reported hemesis, hematochezia, vomiting, or acute GI distress Musculoskeletal: Denies any acute onset joint swelling,  redness, loss of ROM, or weakness Neurological: No reported episodes of acute onset apraxia, aphasia, dysarthria, agnosia, amnesia, paralysis, loss of coordination, or loss of consciousness  Allergies  [name] is allergic to trileptal [oxcarbazepine].  PFSH  Drug: [name]  reports no history of drug use. Alcohol:  reports previous alcohol use. Tobacco:  reports that he quit smoking about 10 years ago. He has never used smokeless tobacco. 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 Surgical: [name]  has a past surgical history that includes Tonsillectomy; Inguinal hernia repair; Eye surgery; Cardiac electrophysiology study and ablation; Prostate biopsy; SEBACEOUS CYST REMOVAL; Esophagogastroduodenoscopy (06/15/2010); and Colonoscopy (04/07/2007). Family: family history includes Heart disease in his father; Other in his mother; Prostate cancer in his father; Stomach cancer in his father.  Constitutional Exam  General appearance: Well nourished, well developed, and well hydrated. In no apparent acute distress Vitals:   08/24/19 1318 08/24/19 1320 08/24/19 1408 08/24/19 1409  BP: (!) 163/100 (!) 135/115 115/77 103/78  Pulse: (!) 131 (!) 133 (!) 133   Resp: 16     Temp: (!) 97.3 F (36.3 C)     TempSrc: Oral     SpO2: 98%     Weight: 199 lb (90.3 kg)     Height: 5' 8"  (1.727 m)      BMI Assessment: Estimated body mass index is 30.26 kg/m as calculated from the following:   Height as of this encounter: 5' 8"  (1.727 m).   Weight as of this encounter: 199 lb (90.3 kg).  BMI interpretation table: BMI level Category Range association with higher incidence of chronic pain  <18 kg/m2 Underweight    18.5-24.9 kg/m2 Ideal body weight   25-29.9 kg/m2 Overweight Increased incidence by 20%  30-34.9 kg/m2 Obese (Class I) Increased incidence by 68%  35-39.9 kg/m2 Severe obesity (Class II) Increased incidence by 136%  >40 kg/m2 Extreme obesity (Class [name]) Increased incidence by 254%   Patient's current BMI Ideal Body weight  Body mass index is 30.26 kg/m. Ideal body weight: 68.4 kg (150 lb 12.7 oz) Adjusted ideal body weight: 77.1 kg (170 lb 1.2 oz)   BMI Readings from Last 4 Encounters:  08/24/19 30.26 kg/m  07/27/19 31.32 kg/m  07/22/19 31.32 kg/m  07/15/19 31.32 kg/m   Wt Readings from Last 4 Encounters:  08/24/19 199 lb (90.3 kg)  07/27/19 206 lb (93.4 kg)  07/22/19 206 lb (93.4 kg)  07/15/19 206 lb (93.4 kg)    Psych/Mental status: Alert, oriented x 3 (person, place, & time)       Eyes: PERLA Respiratory: No evidence of acute respiratory distress  Cervical Spine Exam  Skin & Axial Inspection: No masses, redness, edema, swelling, or associated skin lesions Alignment: Symmetrical Functional ROM: Pain restricted ROM      Stability: No instability detected Muscle Tone/Strength: Functionally intact. No obvious neuro-muscular anomalies detected. Sensory (Neurological): Musculoskeletal pain pattern Palpation: No palpable anomalies  Upper Extremity (UE) Exam    Side: Right upper extremity  Side: Left upper extremity  Skin & Extremity Inspection: Skin color, temperature, and hair growth are WNL. No peripheral edema or cyanosis. No masses, redness, swelling, asymmetry, or associated skin lesions. No contractures.  Skin & Extremity Inspection: Skin color, temperature, and hair growth are WNL. No peripheral edema or cyanosis. No masses, redness, swelling, asymmetry, or associated skin lesions. No contractures.  Functional ROM: Decreased ROM for shoulder and elbow  Functional ROM: Decreased ROM for shoulder and elbow  Muscle Tone/Strength: Functionally intact. No  obvious neuro-muscular anomalies detected.  Muscle Tone/Strength: Functionally intact. No obvious neuro-muscular anomalies detected.  Sensory (Neurological): Unimpaired          Sensory (Neurological): Unimpaired          Palpation: No palpable anomalies              Palpation: No palpable anomalies              Provocative Test(s):  Phalen's test: deferred Tinel's test: deferred Apley's scratch test (touch opposite shoulder):  Action 1 (Across chest): Decreased ROM Action 2 (Overhead): Decreased ROM Action 3 (LB reach): Decreased ROM   Provocative Test(s):  Phalen's test: deferred Tinel's test: deferred Apley's scratch test (touch opposite shoulder):  Action 1 (Across chest): Decreased ROM Action 2 (Overhead): Decreased ROM Action 3 (LB reach): Decreased ROM    Thoracic Spine Area Exam  Skin & Axial Inspection: No masses, redness, or swelling Alignment: Symmetrical Functional ROM: Unrestricted ROM Stability: No instability detected Muscle Tone/Strength: Functionally intact. No obvious neuro-muscular anomalies detected. Sensory (Neurological): Unimpaired Muscle strength & Tone: No palpable anomalies  Lumbar Exam  Skin & Axial Inspection: No masses, redness, or swelling Alignment: Symmetrical Functional ROM: Decreased ROM       Stability: No instability detected Muscle Tone/Strength: Functionally intact. No obvious neuro-muscular anomalies detected. Sensory (Neurological): Musculoskeletal pain pattern  Gait & Posture Assessment  Ambulation: Unassisted Gait: Antalgic Posture: Difficulty standing up straight, due to pain   Lower Extremity Exam    Side: Right lower extremity  Side: Left lower extremity  Stability: No instability observed          Stability: No instability observed          Skin & Extremity Inspection: Skin color, temperature, and hair growth are WNL. No peripheral edema or cyanosis. No masses, redness, swelling, asymmetry, or associated skin lesions. No  contractures.  Skin & Extremity Inspection: Skin color, temperature, and hair growth are WNL. No peripheral edema or cyanosis. No masses, redness, swelling, asymmetry, or associated skin lesions. No contractures.  Functional ROM: Unrestricted ROM                  Functional ROM: Unrestricted ROM                  Muscle Tone/Strength: Functionally intact. No obvious neuro-muscular anomalies detected.  Muscle Tone/Strength: Functionally intact. No obvious neuro-muscular anomalies detected.  Sensory (Neurological): Neuropathic pain pattern        Sensory (Neurological): Neuropathic pain pattern        DTR: Patellar: deferred today Achilles: deferred today Plantar: deferred today  DTR: Patellar: deferred today Achilles: deferred today Plantar: deferred today  Palpation: No palpable anomalies  Palpation: No palpable anomalies   Assessment   Status Diagnosis  Persistent Persistent Persistent 1. Cervicalgia   2. Chronic pain due to trauma   3. Whiplash injury syndrome, sequela   4. Cervical radicular  pain   5. Degenerative disc disease, cervical   6. Dementia without behavioral disturbance, unspecified dementia type ([place])   7. Small fiber neuropathy      Of note, patient has tried and failed tramadol, oxycodone, Percocet, hydrocodone.  He has had side effects of hallucination and confusion with these medications.  The patient has chronic pain and we discussed the addition of a long-acting analgesic for chronic pain which is buprenorphine.  Start at low dose at 5 mcg an hour q. 7 days.  Also recommend patient increase his gabapentin to 600 mg 3 times daily.  Patient counseled on risks and side effects to be aware of.  Plan of Care  Pharmacotherapy (Medications Ordered): Meds ordered this encounter  Medications  . gabapentin (NEURONTIN) 300 MG capsule    Sig: Take 2 capsules (600 mg total) by mouth 3 (three) times daily.    Dispense:  180 capsule    Refill:  1  . DISCONTD: buprenorphine  (BUTRANS) 5 MCG/HR PTWK    Sig: Place 1 patch onto the skin every 7 (seven) days. For chronic pain syndrome.    Dispense:  4 patch    Refill:  1    Do not place this medication, or any other prescription from our practice, on "Automatic Refill". Patient may have prescription filled one day early if pharmacy is closed on scheduled refill date. Do not fill until:  To last until:  . buprenorphine (BUTRANS) 5 MCG/HR PTWK    Sig: Place 1 patch onto the skin every 7 (seven) days. For chronic pain syndrome.    Dispense:  4 patch    Refill:  1    Do not place this medication, or any other prescription from our practice, on "Automatic Refill". Patient may have prescription filled one day early if pharmacy is closed on scheduled refill date.    Planned follow-up:   Return in about 6 weeks (around 10/05/2019) for Medication Management.     Failed oxycodone, hydrocodone, tramadol.  Plan for buprenorphine, Butrans.  Increase gabapentin to 600 mg 3 times daily   Recent Visits Date Type Provider Dept  07/27/19 Office Visit Gillis Santa, MD [facility]  07/16/19 Office Visit Gillis Santa, MD [facility]  Showing recent visits within past 90 days and meeting all other requirements   Today's Visits Date Type Provider Dept  08/24/19 Office Visit Gillis Santa, MD [facility]  Showing today's visits and meeting all other requirements   Future Appointments Date Type Provider Dept  10/06/19 Appointment Gillis Santa, MD [facility]  Showing future appointments within next 90 days and meeting all other requirements   Primary Care Physician: [name], MD Location: [facility] Note by: Gillis Santa, MD Date: 08/24/2019; Time: 2:51 PM  Note: This dictation was prepared with Dragon dictation. Any transcriptional errors that may result from this process are unintentional.

## 2019-08-24 NOTE — Progress Notes (Signed)
Safety precautions to be maintained throughout the outpatient stay will include: orient to surroundings, keep bed in low position, maintain call bell within reach at all times, provide assistance with transfer out of bed and ambulation.  

## 2019-08-26 ENCOUNTER — Encounter: Payer: Self-pay | Admitting: Student in an Organized Health Care Education/Training Program

## 2019-08-26 DIAGNOSIS — E039 Hypothyroidism, unspecified: Secondary | ICD-10-CM | POA: Diagnosis not present

## 2019-08-26 DIAGNOSIS — I10 Essential (primary) hypertension: Secondary | ICD-10-CM | POA: Diagnosis not present

## 2019-08-26 DIAGNOSIS — Z6829 Body mass index (BMI) 29.0-29.9, adult: Secondary | ICD-10-CM | POA: Diagnosis not present

## 2019-08-26 DIAGNOSIS — R002 Palpitations: Secondary | ICD-10-CM | POA: Diagnosis not present

## 2019-08-26 DIAGNOSIS — E663 Overweight: Secondary | ICD-10-CM | POA: Diagnosis not present

## 2019-08-26 DIAGNOSIS — R600 Localized edema: Secondary | ICD-10-CM | POA: Diagnosis not present

## 2019-08-27 ENCOUNTER — Telehealth: Payer: Self-pay | Admitting: Student in an Organized Health Care Education/Training Program

## 2019-08-27 NOTE — Telephone Encounter (Signed)
Called [name] and she statedthat [name] is in a lot of pain to his feet. "He cried all night and just can't get comfortable". Told patient she could always take him to the ER if needed. Is there anything else we can do for him.

## 2019-08-27 NOTE — Telephone Encounter (Signed)
Patients wife called stating the pain patch that Dr. Holley Raring put [name] on is not helping and he is in a lot of pain. Please call [phone] with any options to help with pain.

## 2019-08-31 ENCOUNTER — Other Ambulatory Visit: Payer: Self-pay

## 2019-08-31 ENCOUNTER — Encounter: Payer: Self-pay | Admitting: Student in an Organized Health Care Education/Training Program

## 2019-08-31 ENCOUNTER — Ambulatory Visit
Payer: Medicare Other | Attending: Student in an Organized Health Care Education/Training Program | Admitting: Student in an Organized Health Care Education/Training Program

## 2019-08-31 DIAGNOSIS — G8921 Chronic pain due to trauma: Secondary | ICD-10-CM

## 2019-08-31 DIAGNOSIS — M5412 Radiculopathy, cervical region: Secondary | ICD-10-CM

## 2019-08-31 DIAGNOSIS — M5030 Other cervical disc degeneration, unspecified cervical region: Secondary | ICD-10-CM | POA: Diagnosis not present

## 2019-08-31 DIAGNOSIS — S134XXS Sprain of ligaments of cervical spine, sequela: Secondary | ICD-10-CM | POA: Diagnosis not present

## 2019-08-31 DIAGNOSIS — M542 Cervicalgia: Secondary | ICD-10-CM

## 2019-08-31 MED ORDER — TRAMADOL HCL 50 MG PO TABS: 100.0000 mg | ORAL_TABLET | Freq: Two times a day (BID) | ORAL | 1 refills | Status: DC | PRN

## 2019-08-31 NOTE — Telephone Encounter (Signed)
MD called patient today

## 2019-08-31 NOTE — Telephone Encounter (Signed)
Scheduled patient for virtual at 1:15/ they have funeral after that.

## 2019-08-31 NOTE — Progress Notes (Signed)
Patient: [name]  Service Category: E/M  Provider: Gillis Santa, MD  DOB: September 02, 1940  DOS: 08/31/2019  Location: Office  MRN: [id number]  Setting: Ambulatory outpatient  Referring Provider: [name], MD  Type: Established Patient  Specialty: Interventional Pain Management  PCP: [name], MD  Location: Home  Delivery: TeleHealth     Virtual Encounter - Pain Management PROVIDER NOTE: Information contained herein reflects review and annotations entered in association with encounter. Interpretation of such information and data should be left to medically-trained personnel. Information provided to patient can be located elsewhere in the medical record under "Patient Instructions". Document created using STT-dictation technology, any transcriptional errors that may result from process are unintentional.    Contact & Pharmacy Preferred: [phone] Home: [phone] (home) Mobile: [phone] (mobile) E-mail: No e-mail address on record  [facility] [place], [place] - [place] [address] [place] [address] Phone: [phone] Fax: [phone]   Pre-screening  [name] offered "in-person" vs "virtual" encounter. He indicated preferring virtual for this encounter.   Reason COVID-19*  Social distancing based on CDC and AMA recommendations.   I contacted [name] on 08/31/2019 via telephone.      I clearly identified myself as Gillis Santa, MD. I verified that I was speaking with the correct person using two identifiers (Name: [name], and date of birth: 17-Jun-1940).  This visit was completed via telephone due to the restrictions of the COVID-19 pandemic. All issues as above were discussed and addressed but no physical exam was performed. If it was felt that the patient should be evaluated in the office, they were directed there. The patient verbally consented to this visit. Patient was unable to complete an  audio/visual visit due to Technical difficulties and/or Lack of internet. Due to the catastrophic nature of the COVID-19 pandemic, this visit was done through audio contact only.  Location of the patient: home address (see Epic for details)  Location of the provider: office  Consent I sought verbal advanced consent from [name] for virtual visit interactions. I informed [name] of possible security and privacy concerns, risks, and limitations associated with providing "not-in-person" medical evaluation and management services. I also informed [name] of the availability of "in-person" appointments. Finally, I informed him that there would be a charge for the virtual visit and that he could be  personally, fully or partially, financially responsible for it. [name] expressed understanding and agreed to proceed.   Historic Elements   [name] is a 79 y.o. year old, male patient evaluated today after his last contact with our practice on 08/27/2019. [name]  has a past medical history of Anxiety, Bipolar disorder ([place]), Cataract, Chronic depression, Degenerative lumbar disc, Hypercholesteremia, Hypertension, Malignant neoplasm of prostate ([place]), Peptic esophagitis, Peripheral neuropathy, Squamous cell carcinoma of left tonsil ([place]), Throat cancer ([place]), and Thyroid disease. He also  has a past surgical history that includes Tonsillectomy; Inguinal hernia repair; Eye surgery; Cardiac electrophysiology study and ablation; Prostate biopsy; SEBACEOUS CYST REMOVAL; Esophagogastroduodenoscopy (06/15/2010); and Colonoscopy (04/07/2007). [name] has a current medication list which includes the following prescription(s): amlodipine, buprenorphine, clonidine, furosemide, gabapentin, levothyroxine, lidocaine, metoprolol succinate, pantoprazole, simvastatin, tramadol, and trazodone. He  reports that he quit smoking about 10 years ago. He has never used smokeless tobacco. He  reports previous alcohol use. He reports that he does not use drugs. [name] is allergic to  trileptal [oxcarbazepine].   HPI  Today, he is being contacted for medication management. Patient started buprenorphine patch last Wednesday.  Patient is endorsing mild pain relief with the patch without any side effects.  He is having difficulty managing his breakthrough pain which is worse in his low back.  Patient denies having any additional tramadol.  Last prescription was on February 15.  Instructed patient to continue Butrans patch as prescribed to help with his baseline chronic pain.  Will provide prescription for tramadol as below for breakthrough pain. Pharmacotherapy Assessment  Analgesic:  08/26/2019  2   08/24/2019  Buprenorphine 5 Mcg/Hr Patch  4.00  28 Bi Lat   [id number]   [facility] (0854)   0  0.12 mg  Medicare   [place]    Monitoring: [place] PMP: PDMP reviewed during this encounter.       Pharmacotherapy: No side-effects or adverse reactions reported. Compliance: No problems identified. Effectiveness: Clinically acceptable. Plan: Refer to "POC".  UDS:  Summary  Date Value Ref Range Status  07/21/2019 Note  Final    Comment:    ==================================================================== Compliance Drug Analysis, Ur ==================================================================== Test                             Result       Flag       Units Drug Present and Declared for Prescription Verification   Tramadol                       1940         EXPECTED   ng/mg creat   O-Desmethyltramadol            >5435        EXPECTED   ng/mg creat   N-Desmethyltramadol            775          EXPECTED   ng/mg creat    Source of tramadol is a prescription medication. O-desmethyltramadol    and N-desmethyltramadol are expected metabolites of tramadol.   Gabapentin                     PRESENT      EXPECTED Drug Present not Declared for Prescription Verification   Acetaminophen                   PRESENT      UNEXPECTED Drug Absent but Declared for Prescription Verification   Trazodone                      Not Detected UNEXPECTED   Lidocaine                      Not Detected UNEXPECTED    Lidocaine, as indicated in the declared medication list, is not    always detected even when used as directed.   Metoprolol                     Not Detected UNEXPECTED ==================================================================== Test                      Result    Flag   Units      Ref Range   Creatinine              92  mg/dL      >=20 ==================================================================== Declared Medications:  The flagging and interpretation on this report are based on the  following declared medications.  Unexpected results may arise from  inaccuracies in the declared medications.  **Note: The testing scope of this panel includes these medications:  Gabapentin  Metoprolol  Tramadol  Trazodone  **Note: The testing scope of this panel does not include small to  moderate amounts of these reported medications:  Lidocaine  **Note: The testing scope of this panel does not include the  following reported medications:  Amlodipine  Furosemide  Levothyroxine  Pantoprazole  Simvastatin ==================================================================== For clinical consultation, please call [phone]. ====================================================================    Laboratory Chemistry Profile   Renal Lab Results  Component Value Date   BUN 19 12/16/2018   CREATININE 1.45 (H) 12/16/2018   BCR 13 12/16/2018   GFRAA 54 (L) 12/16/2018   GFRNONAA 46 (L) 12/16/2018    Hepatic Lab Results  Component Value Date   AST 31 12/16/2018   ALT 26 12/16/2018   ALBUMIN 4.6 12/16/2018   ALKPHOS 81 12/16/2018    Electrolytes Lab Results  Component Value Date   NA 141 12/16/2018   K 4.6 12/16/2018   CL 104 12/16/2018   CALCIUM 9.4 12/16/2018     Bone Lab Results  Component Value Date   VD25OH 35.5 12/16/2018    Inflammation (CRP: Acute Phase) (ESR: Chronic Phase) Lab Results  Component Value Date   CRP 4 12/16/2018   ESRSEDRATE 3 12/16/2018      Note: Above Lab results reviewed.  Imaging  DG ESOPHAGUS W DOUBLE CM (HD) CLINICAL DATA:  Esophageal dysphagia.  Odynophagia.  EXAM: ESOPHOGRAM / BARIUM SWALLOW / BARIUM TABLET STUDY  TECHNIQUE: Combined double contrast and single contrast examination performed using effervescent crystals, thick barium liquid, and thin barium liquid. The patient was observed with fluoroscopy swallowing a 13 mm barium sulphate tablet.  FLUOROSCOPY TIME:  Fluoroscopy Time:  2 minutes and 12 seconds  Radiation Exposure Index (if provided by the fluoroscopic device): 27.8  Number of Acquired Spot Images: 0  COMPARISON:  Chest CT of 12/14/2017 from [facility].  FINDINGS: Evaluation of the hypopharynx demonstrates a prominent cricopharyngeus muscle and a pre cricoid impression. Small Zenker's diverticulum.  Double contrast evaluation of the esophagus demonstrates no mucosal abnormality.  During the double contrast portion exam, it became apparent that the patient was having silent aspiration. Therefore, decision was made to not lay patient prone or evaluate primary peristalsis. Full column evaluation was performed with the patient upright.  Given this mild limitation, no evidence of esophageal stricture identified. No hiatal hernia.  A 13 mm barium tablet passes promptly.  IMPRESSION: 1. Silent aspiration with successive swallows during double-contrast portion of exam. 2. Secondary to this fact, the study was performed entirely upright and esophageal peristalsis was not evaluated. 3. Given this limitation, no evidence of esophageal stricture or anatomic cause for patient's symptoms identified. 4. Small Zenker's diverticulum. 5. Consider speech pathology  evaluation.  These results will be called to the ordering clinician or representative by the Radiologist Assistant, and communication documented in the PACS or zVision Dashboard.  Electronically Signed   By: Abigail Miyamoto M.D.   On: 07/17/2019 11:06  Assessment  The primary encounter diagnosis was Chronic pain due to trauma. Diagnoses of Cervicalgia, Whiplash injury syndrome, sequela, Cervical radicular pain, and Degenerative disc disease, cervical were also pertinent to this visit.  Plan of Care  Mr. Sherrel Shafer Levee [name] has  a current medication list which includes the following long-term medication(s): amlodipine, clonidine, furosemide, gabapentin, levothyroxine, metoprolol succinate, pantoprazole, simvastatin, and trazodone.  Pharmacotherapy (Medications Ordered): Meds ordered this encounter  Medications  . traMADol (ULTRAM) 50 MG tablet    Sig: Take 2 tablets (100 mg total) by mouth every 12 (twelve) hours as needed for severe pain. Month last 30 days.    Dispense:  120 tablet    Refill:  1    [place] STOP ACT - Not applicable. Fill one day early if pharmacy is closed on scheduled refill date.   Follow-up plan:   Return for Keep sch. appt.     Failed oxycodone, hydrocodone, tramadol.  Plan for buprenorphine, Butrans.  Increase gabapentin to 600 mg 3 times daily    Recent Visits Date Type Provider Dept  08/24/19 Office Visit Gillis Santa, MD [facility]  07/27/19 Office Visit Gillis Santa, MD [facility]  07/16/19 Office Visit Gillis Santa, MD [facility]  Showing recent visits within past 90 days and meeting all other requirements   Today's Visits Date Type Provider Dept  08/31/19 Office Visit Gillis Santa, MD [facility]  Showing today's visits and meeting all other requirements   Future Appointments Date Type Provider Dept  10/06/19 Appointment Gillis Santa, MD [facility]  Showing future appointments within next 90  days and meeting all other requirements   I discussed the assessment and treatment plan with the patient. The patient was provided an opportunity to ask questions and all were answered. The patient agreed with the plan and demonstrated an understanding of the instructions.  Patient advised to call back or seek an in-person evaluation if the symptoms or condition worsens.  Duration of encounter: 25 minutes.  Note by: Gillis Santa, MD Date: 08/31/2019; Time: 1:37 PM

## 2019-09-07 DIAGNOSIS — J189 Pneumonia, unspecified organism: Secondary | ICD-10-CM | POA: Diagnosis not present

## 2019-09-07 DIAGNOSIS — J9601 Acute respiratory failure with hypoxia: Secondary | ICD-10-CM | POA: Diagnosis not present

## 2019-09-07 DIAGNOSIS — I10 Essential (primary) hypertension: Secondary | ICD-10-CM | POA: Diagnosis not present

## 2019-09-07 DIAGNOSIS — R4182 Altered mental status, unspecified: Secondary | ICD-10-CM | POA: Diagnosis not present

## 2019-09-08 DIAGNOSIS — N3941 Urge incontinence: Secondary | ICD-10-CM | POA: Diagnosis not present

## 2019-09-23 DIAGNOSIS — Z23 Encounter for immunization: Secondary | ICD-10-CM | POA: Diagnosis not present

## 2019-09-28 ENCOUNTER — Ambulatory Visit (INDEPENDENT_AMBULATORY_CARE_PROVIDER_SITE_OTHER): Payer: Medicare Other | Admitting: Cardiology

## 2019-09-28 ENCOUNTER — Encounter: Payer: Self-pay | Admitting: Cardiology

## 2019-09-28 ENCOUNTER — Other Ambulatory Visit: Payer: Self-pay

## 2019-09-28 DIAGNOSIS — C61 Malignant neoplasm of prostate: Secondary | ICD-10-CM | POA: Insufficient documentation

## 2019-09-28 DIAGNOSIS — F319 Bipolar disorder, unspecified: Secondary | ICD-10-CM | POA: Insufficient documentation

## 2019-09-28 DIAGNOSIS — I10 Essential (primary) hypertension: Secondary | ICD-10-CM

## 2019-09-28 DIAGNOSIS — R011 Cardiac murmur, unspecified: Secondary | ICD-10-CM | POA: Insufficient documentation

## 2019-09-28 DIAGNOSIS — I4891 Unspecified atrial fibrillation: Secondary | ICD-10-CM | POA: Insufficient documentation

## 2019-09-28 DIAGNOSIS — I4510 Unspecified right bundle-branch block: Secondary | ICD-10-CM

## 2019-09-28 DIAGNOSIS — C140 Malignant neoplasm of pharynx, unspecified: Secondary | ICD-10-CM | POA: Insufficient documentation

## 2019-09-28 DIAGNOSIS — C801 Malignant (primary) neoplasm, unspecified: Secondary | ICD-10-CM

## 2019-09-28 HISTORY — DX: Malignant neoplasm of prostate: C61

## 2019-09-28 HISTORY — DX: Unspecified atrial fibrillation: I48.91

## 2019-09-28 HISTORY — DX: Essential (primary) hypertension: I10

## 2019-09-28 HISTORY — DX: Unspecified right bundle-branch block: I45.10

## 2019-09-28 HISTORY — DX: Cardiac murmur, unspecified: R01.1

## 2019-09-28 HISTORY — DX: Malignant (primary) neoplasm, unspecified: C80.1

## 2019-09-28 NOTE — Progress Notes (Signed)
Cardiology Office Note:    Date:  09/28/2019   ID:  [name], DOB 11-Jun-1941, MRN [id number]  PCP:  [name], MD  Cardiologist:  Jenean Lindau, MD   Referring MD: Farrel Conners*    ASSESSMENT:    1. Essential hypertension   2. Cardiac murmur    PLAN:    In order of problems listed above:  1. Primary prevention stressed to the patient.  Importance of compliance with diet medication stressed and vocalized understanding.  He has had lab work done at primary care physician and we will review those records. 2. Essential hypertension: Blood pressure is stable now.  I told the patient to let me know which medications he takes at what time of the day as pertaining to hypertension controlled.  Also diet was emphasized.  They will write the blood pressures twice a day and bring it to me next time. 3. Cardiac murmur: Could be aortic sclerosis or mild stenosis.  We will have an echocardiogram to assess this. 4. Mixed dyslipidemia: Lipids managed by primary care physician and he is doing well with diet.  He tells me also that is a diet-controlled diabetic.  I reviewed lab work. 5. Importance of regular exercise stressed walking 30 minutes a day at least 5 days a week and he promises to do so. 6. Patient will be seen in follow-up appointment in 2 months or earlier if the patient has any concerns    Medication Adjustments/Labs and Tests Ordered: Current medicines are reviewed at length with the patient today.  Concerns regarding medicines are outlined above.  No orders of the defined types were placed in this encounter.  No orders of the defined types were placed in this encounter.    History of Present Illness:    [name] is a 79 y.o. male who is being seen today for the evaluation of essential hypertension at the request of Clydie Braun T, F*.  Patient is a pleasant 79 year old male.  He has past medical history of essential hypertension.   He denies any chest pain orthopnea or PND.  His wife mentions to me that he is an active gentleman and works in the yard quite a bit without any symptoms.  No dizziness no palpitations his only issue is hypertension and wants to be evaluated.  He feels that there used to be swings of fluid and high blood pressure but these have stabilized.  At the time of my evaluation, the patient is alert awake oriented and in no distress.  Past Medical History:  Diagnosis Date  . Anxiety   . Bipolar disorder ([place])    w/dementia/psychotic episodes  . Cataract   . Chronic depression   . Degenerative lumbar disc   . Hypercholesteremia   . Hypertension   . Malignant neoplasm of prostate ([place])   . Peptic esophagitis   . Peripheral neuropathy   . Squamous cell carcinoma of left tonsil ([facility])   . Throat cancer ([place])   . Thyroid disease     Past Surgical History:  Procedure Laterality Date  . CARDIAC ELECTROPHYSIOLOGY STUDY AND ABLATION    . COLONOSCOPY  04/07/2007   Small colonic polyp status post polypectomy. Pancolonic diverticulosis predominantly in the sigmoid colon. Internal hemorrhoids.   . ESOPHAGOGASTRODUODENOSCOPY  06/15/2010   Erosive esophagitis. Status post PEG placment  . EYE SURGERY    . INGUINAL HERNIA REPAIR    . PROSTATE BIOPSY    . SEBACEOUS  CYST REMOVAL    . TONSILLECTOMY      Current Medications: Current Meds  Medication Sig  . amLODipine (NORVASC) 10 MG tablet Take 10 mg by mouth daily.  . cloNIDine (CATAPRES) 0.1 MG tablet Take 0.1 mg by mouth daily.  . famotidine (PEPCID) 40 MG tablet Take 40 mg by mouth daily.  . furosemide (LASIX) 20 MG tablet Take 20 mg by mouth daily as needed.  . gabapentin (NEURONTIN) 300 MG capsule Take 2 capsules (600 mg total) by mouth 3 (three) times daily.  Marland Kitchen ibuprofen (ADVIL) 200 MG tablet Take 200 mg by mouth every 6 (six) hours as needed.  Marland Kitchen levothyroxine (EUTHYROX) 100 MCG tablet Take 100 mcg by mouth daily before breakfast.  . metoprolol  succinate (TOPROL-XL) 25 MG 24 hr tablet Take 25 mg by mouth daily.  . pantoprazole (PROTONIX) 40 MG tablet Take 40 mg by mouth 2 (two) times daily.  . traZODone (DESYREL) 50 MG tablet Take 50 mg by mouth at bedtime. Can take 1-2     Allergies:   Trileptal [oxcarbazepine]   Social History   Socioeconomic History  . Marital status: Married    Spouse name: Not on file  . Number of children: 3  . Years of education: 10th grade  . Highest education level: Not on file  Occupational History  . Occupation: Retired  Tobacco Use  . Smoking status: Former Smoker    Quit date: 2011    Years since quitting: 10.3  . Smokeless tobacco: Never Used  Substance and Sexual Activity  . Alcohol use: Not Currently  . Drug use: Never  . Sexual activity: Not on file  Other Topics Concern  . Not on file  Social History Narrative   Lives at home with wife.   Right-handed.   2 cups of caffeine per day.   Social Determinants of Health   Financial Resource Strain:   . Difficulty of Paying Living Expenses:   Food Insecurity:   . Worried About Charity fundraiser in the Last Year:   . Arboriculturist in the Last Year:   Transportation Needs:   . Film/video editor (Medical):   Marland Kitchen Lack of Transportation (Non-Medical):   Physical Activity:   . Days of Exercise per Week:   . Minutes of Exercise per Session:   Stress:   . Feeling of Stress :   Social Connections:   . Frequency of Communication with Friends and Family:   . Frequency of Social Gatherings with Friends and Family:   . Attends Religious Services:   . Active Member of Clubs or Organizations:   . Attends Archivist Meetings:   Marland Kitchen Marital Status:      Family History: The patient's family history includes Heart disease in his father; Other in his mother; Prostate cancer in his father; Stomach cancer in his father. There is no history of Colon cancer, Esophageal cancer, or Rectal cancer.  ROS:   Please see the history of  present illness.    All other systems reviewed and are negative.  EKGs/Labs/Other Studies Reviewed:    The following studies were reviewed today: EKG reveals sinus rhythm and nonspecific ST-T changes.   Recent Labs: 12/16/2018: ALT 26; BUN 19; Creatinine, Ser 1.45; Hemoglobin 14.6; Platelets 182; Potassium 4.6; Sodium 141; TSH 2.850  Recent Lipid Panel No results found for: CHOL, TRIG, HDL, CHOLHDL, VLDL, LDLCALC, LDLDIRECT  Physical Exam:    VS:  BP 118/62   Pulse 64  Temp 97.9 F (36.6 C)   Ht 5\' 8"  (1.727 m)   Wt 201 lb 6.4 oz (91.4 kg)   SpO2 97%   BMI 30.62 kg/m     Wt Readings from Last 3 Encounters:  09/28/19 201 lb 6.4 oz (91.4 kg)  08/24/19 199 lb (90.3 kg)  07/27/19 206 lb (93.4 kg)     GEN: Patient is in no acute distress HEENT: Normal NECK: No JVD; No carotid bruits LYMPHATICS: No lymphadenopathy CARDIAC: S1 S2 regular, 2/6 systolic murmur at the apex. RESPIRATORY:  Clear to auscultation without rales, wheezing or rhonchi  ABDOMEN: Soft, non-tender, non-distended MUSCULOSKELETAL:  No edema; No deformity  SKIN: Warm and dry NEUROLOGIC:  Alert and oriented x 3 PSYCHIATRIC:  Normal affect    Signed, Jenean Lindau, MD  09/28/2019 2:55 PM    [place]

## 2019-09-28 NOTE — Patient Instructions (Signed)
Medication Instructions:  No medications changes *If you need a refill on your cardiac medications before your next appointment, please call your pharmacy*   Lab Work: None ordered If you have labs (blood work) drawn today and your tests are completely normal, you will receive your results only by: Marland Kitchen MyChart Message (if you have MyChart) OR . A paper copy in the mail If you have any lab test that is abnormal or we need to change your treatment, we will call you to review the results.   Testing/Procedures: Your physician has requested that you have an echocardiogram. Echocardiography is a painless test that uses sound waves to create images of your heart. It provides your doctor with information about the size and shape of your heart and how well your heart's chambers and valves are working. This procedure takes approximately one hour. There are no restrictions for this procedure.     Follow-Up: At [facility], you and your health needs are our priority.  As part of our continuing mission to provide you with exceptional heart care, we have created designated Provider Care Teams.  These Care Teams include your primary Cardiologist (physician) and Advanced Practice Providers (APPs -  Physician Assistants and Nurse Practitioners) who all work together to provide you with the care you need, when you need it.  We recommend signing up for the patient portal called "MyChart".  Sign up information is provided on this After Visit Summary.  MyChart is used to connect with patients for Virtual Visits (Telemedicine).  Patients are able to view lab/test results, encounter notes, upcoming appointments, etc.  Non-urgent messages can be sent to your provider as well.   To learn more about what you can do with MyChart, go to [email or web address].    Your next appointment:   2 month(s)  The format for your next appointment:   In Person  Provider:   Jyl Heinz, MD   Other  Instructions   Echocardiogram An echocardiogram is a procedure that uses painless sound waves (ultrasound) to produce an image of the heart. Images from an echocardiogram can provide important information about:  Signs of coronary artery disease (CAD).  Aneurysm detection. An aneurysm is a weak or damaged part of an artery wall that bulges out from the normal force of blood pumping through the body.  Heart size and shape. Changes in the size or shape of the heart can be associated with certain conditions, including heart failure, aneurysm, and CAD.  Heart muscle function.  Heart valve function.  Signs of a past heart attack.  Fluid buildup around the heart.  Thickening of the heart muscle.  A tumor or infectious growth around the heart valves. Tell a health care provider about:  Any allergies you have.  All medicines you are taking, including vitamins, herbs, eye drops, creams, and over-the-counter medicines.  Any blood disorders you have.  Any surgeries you have had.  Any medical conditions you have.  Whether you are pregnant or may be pregnant. What are the risks? Generally, this is a safe procedure. However, problems may occur, including:  Allergic reaction to dye (contrast) that may be used during the procedure. What happens before the procedure? No specific preparation is needed. You may eat and drink normally. What happens during the procedure?   An IV tube may be inserted into one of your veins.  You may receive contrast through this tube. A contrast is an injection that improves the quality of the pictures from your heart.  A gel will be applied to your chest.  A wand-like tool (transducer) will be moved over your chest. The gel will help to transmit the sound waves from the transducer.  The sound waves will harmlessly bounce off of your heart to allow the heart images to be captured in real-time motion. The images will be recorded on a computer. The  procedure may vary among health care providers and hospitals. What happens after the procedure?  You may return to your normal, everyday life, including diet, activities, and medicines, unless your health care provider tells you not to do that. Summary  An echocardiogram is a procedure that uses painless sound waves (ultrasound) to produce an image of the heart.  Images from an echocardiogram can provide important information about the size and shape of your heart, heart muscle function, heart valve function, and fluid buildup around your heart.  You do not need to do anything to prepare before this procedure. You may eat and drink normally.  After the echocardiogram is completed, you may return to your normal, everyday life, unless your health care provider tells you not to do that. This information is not intended to replace advice given to you by your health care provider. Make sure you discuss any questions you have with your health care provider. Document Revised: 09/18/2018 Document Reviewed: 06/30/2016 Elsevier Patient Education  [place].

## 2019-09-29 ENCOUNTER — Telehealth: Payer: Self-pay | Admitting: Student in an Organized Health Care Education/Training Program

## 2019-09-29 NOTE — Telephone Encounter (Signed)
Patients wife lvmail asking about medication dose adjustment ? Says refill did not reflect this adjustment. Please check and let patient know status.thank you

## 2019-09-29 NOTE — Telephone Encounter (Signed)
Called and talked with patients wife and she was unclear of instructions of how to give patient his medication. She had been given Tramadol 50mg  2 caps TID. States that was what she was instructed to do. She read the bottle and and it States to take BID. Reviewed MD's last note and it clearly states to take Tramadol 50mg  BID for severe pain and increase the Gabapentin 300mg  2 tabs TID.  Mrs Kosier says it is not controlling his pain and is having to give Mr Poulson additional Motrin, which she does not like to do related to stomach issues.  She did state that he stopped wearing patch because MR Suder told her it was not working. He does have three patches left.    Mrs Patron is now giving Tramadol BID as ordered. Just wanted to let you know.

## 2019-09-30 ENCOUNTER — Telehealth: Payer: Self-pay | Admitting: *Deleted

## 2019-09-30 ENCOUNTER — Encounter: Payer: Self-pay | Admitting: Student in an Organized Health Care Education/Training Program

## 2019-09-30 NOTE — Telephone Encounter (Signed)
No additional notes

## 2019-10-01 ENCOUNTER — Telehealth: Payer: Self-pay | Admitting: Student in an Organized Health Care Education/Training Program

## 2019-10-01 ENCOUNTER — Ambulatory Visit
Payer: Medicare Other | Attending: Student in an Organized Health Care Education/Training Program | Admitting: Student in an Organized Health Care Education/Training Program

## 2019-10-01 ENCOUNTER — Other Ambulatory Visit: Payer: Self-pay

## 2019-10-01 ENCOUNTER — Encounter: Payer: Self-pay | Admitting: Student in an Organized Health Care Education/Training Program

## 2019-10-01 DIAGNOSIS — G629 Polyneuropathy, unspecified: Secondary | ICD-10-CM

## 2019-10-01 DIAGNOSIS — S134XXS Sprain of ligaments of cervical spine, sequela: Secondary | ICD-10-CM | POA: Diagnosis not present

## 2019-10-01 DIAGNOSIS — M5412 Radiculopathy, cervical region: Secondary | ICD-10-CM | POA: Diagnosis not present

## 2019-10-01 DIAGNOSIS — G8921 Chronic pain due to trauma: Secondary | ICD-10-CM

## 2019-10-01 DIAGNOSIS — F0390 Unspecified dementia without behavioral disturbance: Secondary | ICD-10-CM | POA: Diagnosis not present

## 2019-10-01 DIAGNOSIS — M542 Cervicalgia: Secondary | ICD-10-CM | POA: Diagnosis not present

## 2019-10-01 DIAGNOSIS — M5030 Other cervical disc degeneration, unspecified cervical region: Secondary | ICD-10-CM | POA: Diagnosis not present

## 2019-10-01 DIAGNOSIS — E039 Hypothyroidism, unspecified: Secondary | ICD-10-CM | POA: Diagnosis not present

## 2019-10-01 DIAGNOSIS — Z79899 Other long term (current) drug therapy: Secondary | ICD-10-CM | POA: Diagnosis not present

## 2019-10-01 MED ORDER — HYDROCODONE-ACETAMINOPHEN 10-325 MG PO TABS: 1.0000 | ORAL_TABLET | Freq: Three times a day (TID) | ORAL | 0 refills | Status: DC | PRN

## 2019-10-01 NOTE — Telephone Encounter (Signed)
Pharmacy calling to verify transitioning from tramadol to Hydrocodone

## 2019-10-01 NOTE — Progress Notes (Signed)
Patient: [name]  Service Category: E/M  Provider: Gillis Santa, MD  DOB: 10-Apr-1941  DOS: 10/01/2019  Location: Office  MRN: [id number]  Setting: Ambulatory outpatient  Referring Provider: [name], MD  Type: Established Patient  Specialty: Interventional Pain Management  PCP: [name], MD  Location: Home  Delivery: TeleHealth     Virtual Encounter - Pain Management PROVIDER NOTE: Information contained herein reflects review and annotations entered in association with encounter. Interpretation of such information and data should be left to medically-trained personnel. Information provided to patient can be located elsewhere in the medical record under "Patient Instructions". Document created using STT-dictation technology, any transcriptional errors that may result from process are unintentional.    Contact & Pharmacy Preferred: [phone] Home: [phone] (home) Mobile: [phone] (mobile) E-mail: No e-mail address on record  [facility], [place] - [place] [address] [place] [address] Phone: [phone] Fax: [phone]   Pre-screening  [name] offered "in-person" vs "virtual" encounter. He indicated preferring virtual for this encounter.   Reason COVID-19*  Social distancing based on CDC and AMA recommendations.   I contacted [name] on 10/01/2019 via telephone.      I clearly identified myself as Gillis Santa, MD. I verified that I was speaking with the correct person using two identifiers (Name: [name], and date of birth: July 14, 1940).  This visit was completed via telephone due to the restrictions of the COVID-19 pandemic. All issues as above were discussed and addressed but no physical exam was performed. If it was felt that the patient should be evaluated in the office, they were directed there. The patient verbally consented to this visit. Patient was unable to complete an  audio/visual visit due to Technical difficulties and/or Lack of internet. Due to the catastrophic nature of the COVID-19 pandemic, this visit was done through audio contact only.  Location of the patient: home address (see Epic for details)  Location of the provider: office  Consent I sought verbal advanced consent from [name] for virtual visit interactions. I informed [name] of possible security and privacy concerns, risks, and limitations associated with providing "not-in-person" medical evaluation and management services. I also informed [name] of the availability of "in-person" appointments. Finally, I informed him that there would be a charge for the virtual visit and that he could be  personally, fully or partially, financially responsible for it. [name] expressed understanding and agreed to proceed.   Historic Elements   [name] is a 79 y.o. year old, male patient evaluated today after his last contact with our practice on 09/30/2019. 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 He also  has a past surgical history that includes Tonsillectomy; Inguinal hernia repair; Eye surgery; Cardiac electrophysiology study and ablation; Prostate biopsy; SEBACEOUS CYST REMOVAL; Esophagogastroduodenoscopy (06/15/2010); and Colonoscopy (04/07/2007). [name] has a current medication list which includes the following prescription(s): amlodipine, clonidine, famotidine, furosemide, gabapentin, ibuprofen, levothyroxine, metoprolol succinate, pantoprazole, trazodone, and hydrocodone-acetaminophen. He  reports that he quit smoking about 10 years ago. He has never used smokeless tobacco. He  reports previous alcohol use. He reports that he does not use drugs. [name] is allergic to trileptal [  oxcarbazepine].   HPI  Today, he is being contacted for medication management.   No benefit with Butrans at 5 mcg/hr. no benefit with tramadol at increased dose of 100 mg twice daily.  Continues to have neck pain that radiates into bilateral arms.  We will transition to hydrocodone and see if that has a better analgesic effect.  Continue gabapentin as prescribed.   Pharmacotherapy Assessment  Analgesic: start Hydrocodone 10 mg TID prn Monitoring: [place] PMP: PDMP reviewed during this encounter.       Pharmacotherapy: No side-effects or adverse reactions reported. Compliance: No problems identified. Effectiveness: Clinically acceptable. Plan: Refer to "POC".  UDS:  Summary  Date Value Ref Range Status  07/21/2019 Note  Final    Comment:    ==================================================================== Compliance Drug Analysis, Ur ==================================================================== Test                             Result       Flag       Units Drug Present and Declared for Prescription Verification   Tramadol                       1938         EXPECTED   ng/mg creat   O-Desmethyltramadol            >5435        EXPECTED   ng/mg creat   N-Desmethyltramadol            775          EXPECTED   ng/mg creat    Source of tramadol is a prescription medication. O-desmethyltramadol    and N-desmethyltramadol are expected metabolites of tramadol.   Gabapentin                     PRESENT      EXPECTED Drug Present not Declared for Prescription Verification   Acetaminophen                  PRESENT      UNEXPECTED Drug Absent but Declared for Prescription Verification   Trazodone                      Not Detected UNEXPECTED   Lidocaine                      Not Detected UNEXPECTED    Lidocaine, as indicated in the declared medication list, is not    always detected even  when used as directed.   Metoprolol                     Not Detected UNEXPECTED ==================================================================== Test                      Result    Flag   Units      Ref Range   Creatinine              92               mg/dL      >=20 ==================================================================== Declared Medications:  The flagging and interpretation on this report are based on the  following declared medications.  Unexpected results may arise from  inaccuracies in the declared medications.  **Note: The testing scope of this panel includes these medications:  Gabapentin  Metoprolol  Tramadol  Trazodone  **Note: The testing scope of this panel does not include small to  moderate amounts of these reported medications:  Lidocaine  **Note: The testing scope of this panel does not include the  following reported medications:  Amlodipine  Furosemide  Levothyroxine  Pantoprazole  Simvastatin ==================================================================== For clinical consultation, please call [phone]. ====================================================================    Laboratory Chemistry Profile   Renal Lab Results  Component Value Date   BUN 19 12/16/2018   CREATININE 1.45 (H) 12/16/2018   BCR 13 12/16/2018   GFRAA 54 (L) 12/16/2018   GFRNONAA 46 (L) 12/16/2018     Hepatic Lab Results  Component Value Date   AST 31 12/16/2018   ALT 26 12/16/2018   ALBUMIN 4.6 12/16/2018   ALKPHOS 81 12/16/2018     Electrolytes Lab Results  Component Value Date   NA 141 12/16/2018   K 4.6 12/16/2018   CL 104 12/16/2018   CALCIUM 9.4 12/16/2018     Bone Lab Results  Component Value Date   VD25OH 35.5 12/16/2018     Inflammation (CRP: Acute Phase) (ESR: Chronic Phase) Lab Results  Component Value Date   CRP 4 12/16/2018   ESRSEDRATE 3 12/16/2018       Note: Above Lab results reviewed.  Imaging  DG ESOPHAGUS W  DOUBLE CM (HD) CLINICAL DATA:  Esophageal dysphagia.  Odynophagia.  EXAM: ESOPHOGRAM / BARIUM SWALLOW / BARIUM TABLET STUDY  TECHNIQUE: Combined double contrast and single contrast examination performed using effervescent crystals, thick barium liquid, and thin barium liquid. The patient was observed with fluoroscopy swallowing a 13 mm barium sulphate tablet.  FLUOROSCOPY TIME:  Fluoroscopy Time:  2 minutes and 12 seconds  Radiation Exposure Index (if provided by the fluoroscopic device): 27.8  Number of Acquired Spot Images: 0  COMPARISON:  Chest CT of 12/14/2017 from [facility].  FINDINGS: Evaluation of the hypopharynx demonstrates a prominent cricopharyngeus muscle and a pre cricoid impression. Small Zenker's diverticulum.  Double contrast evaluation of the esophagus demonstrates no mucosal abnormality.  During the double contrast portion exam, it became apparent that the patient was having silent aspiration. Therefore, decision was made to not lay patient prone or evaluate primary peristalsis. Full column evaluation was performed with the patient upright.  Given this mild limitation, no evidence of esophageal stricture identified. No hiatal hernia.  A 13 mm barium tablet passes promptly.  IMPRESSION: 1. Silent aspiration with successive swallows during double-contrast portion of exam. 2. Secondary to this fact, the study was performed entirely upright and esophageal peristalsis was not evaluated. 3. Given this limitation, no evidence of esophageal stricture or anatomic cause for patient's symptoms identified. 4. Small Zenker's diverticulum. 5. Consider speech pathology evaluation.  These results will be called to the ordering clinician or representative by the Radiologist Assistant, and communication documented in the PACS or zVision Dashboard.  Electronically Signed   By: Abigail Miyamoto M.[name].   On: 07/17/2019 11:06  Assessment  The primary encounter  diagnosis was Chronic pain due to trauma. Diagnoses of Cervicalgia, Whiplash injury syndrome, sequela, Cervical radicular pain, Degenerative disc disease, cervical, Dementia without behavioral disturbance, unspecified dementia type ([place]), and Small fiber neuropathy were also pertinent to this visit.  Plan of Care   [name] has a current medication list which includes the following long-term medication(s): famotidine, furosemide, gabapentin, and metoprolol succinate.  Continue Gabapentin as prescribed.  Patient cautioned on the risk of cognitive impairment with opioid therapy.  We  will have to balance this with pain management as the patient continues to have significant neck pain that is impacting his quality of life.  I did speak with the patient's wife who states that she is responsible for dispensing medications to him.  Pharmacotherapy (Medications Ordered): Meds ordered this encounter  Medications  . HYDROcodone-acetaminophen (NORCO) 10-325 MG tablet    Sig: Take 1 tablet by mouth every 8 (eight) hours as needed for severe pain. Must last 30 days.    Dispense:  90 tablet    Refill:  0    Chronic Pain. (STOP Act - Not applicable). Fill one day early if closed on scheduled refill date.   Follow-up plan:   Return in about 4 weeks (around 10/29/2019) for Medication Management, virtual.     Failed oxycodone, hydrocodone, tramadol, buprenorphine, Butrans. Continue  gabapentin to 600 mg 3 times daily     Recent Visits Date Type Provider Dept  08/31/19 Office Visit Gillis Santa, MD [facility]  08/24/19 Office Visit Gillis Santa, MD [facility]  07/27/19 Office Visit Gillis Santa, MD [facility]  07/16/19 Office Visit Gillis Santa, MD [facility]  Showing recent visits within past 90 days and meeting all other requirements   Today's Visits Date Type Provider Dept  10/01/19 Telemedicine Gillis Santa, MD [facility]   Showing today's visits and meeting all other requirements   Future Appointments No visits were found meeting these conditions.  Showing future appointments within next 90 days and meeting all other requirements   I discussed the assessment and treatment plan with the patient. The patient was provided an opportunity to ask questions and all were answered. The patient agreed with the plan and demonstrated an understanding of the instructions.  Patient advised to call back or seek an in-person evaluation if the symptoms or condition worsens.  Duration of encounter: 25 minutes.  Note by: Gillis Santa, MD Date: 10/01/2019; Time: 1:04 PM

## 2019-10-01 NOTE — Telephone Encounter (Signed)
[facility], comfirmed that patient has been prescribed Hydrocodone by Dr. Holley Raring.

## 2019-10-05 ENCOUNTER — Telehealth: Payer: Self-pay | Admitting: *Deleted

## 2019-10-06 ENCOUNTER — Telehealth: Payer: Medicare Other | Admitting: Student in an Organized Health Care Education/Training Program

## 2019-10-07 ENCOUNTER — Telehealth: Payer: Self-pay | Admitting: Student in an Organized Health Care Education/Training Program

## 2019-10-07 NOTE — Telephone Encounter (Signed)
Added to Thurs 10-08-19 schedule for Dr. Holley Raring, will need Nurse chart call

## 2019-10-07 NOTE — Telephone Encounter (Signed)
Oops called pateint back and gave him Monday at 11:15. Sorry bout that

## 2019-10-07 NOTE — Telephone Encounter (Signed)
I dont see his name. I thought Dr. Holley Raring was off tomorrow.

## 2019-10-08 ENCOUNTER — Encounter: Payer: Self-pay | Admitting: Student in an Organized Health Care Education/Training Program

## 2019-10-12 ENCOUNTER — Ambulatory Visit
Payer: Medicare Other | Attending: Student in an Organized Health Care Education/Training Program | Admitting: Student in an Organized Health Care Education/Training Program

## 2019-10-12 ENCOUNTER — Other Ambulatory Visit: Payer: Self-pay

## 2019-10-12 DIAGNOSIS — F329 Major depressive disorder, single episode, unspecified: Secondary | ICD-10-CM

## 2019-10-12 DIAGNOSIS — G8921 Chronic pain due to trauma: Secondary | ICD-10-CM

## 2019-10-12 DIAGNOSIS — M5412 Radiculopathy, cervical region: Secondary | ICD-10-CM

## 2019-10-12 DIAGNOSIS — R4586 Emotional lability: Secondary | ICD-10-CM

## 2019-10-12 DIAGNOSIS — S134XXS Sprain of ligaments of cervical spine, sequela: Secondary | ICD-10-CM | POA: Diagnosis not present

## 2019-10-12 DIAGNOSIS — F0390 Unspecified dementia without behavioral disturbance: Secondary | ICD-10-CM | POA: Diagnosis not present

## 2019-10-12 HISTORY — DX: Major depressive disorder, single episode, unspecified: F32.9

## 2019-10-12 HISTORY — DX: Emotional lability: R45.86

## 2019-10-12 MED ORDER — TIZANIDINE HCL 4 MG PO TABS: 2.0000 mg | ORAL_TABLET | Freq: Every evening | ORAL | 2 refills | Status: DC | PRN

## 2019-10-12 NOTE — Progress Notes (Signed)
Patient: [name]  Service Category: E/M  Provider: Gillis Santa, MD  DOB: [birth year]  DOS: 10/12/2019  Location: Office  MRN: [id number]  Setting: Ambulatory outpatient  Referring Provider: [name], MD  Type: Established Patient  Specialty: Interventional Pain Management  PCP: [name], MD  Location: Home  Delivery: TeleHealth     Virtual Encounter - Pain Management PROVIDER NOTE: Information contained herein reflects review and annotations entered in association with encounter. Interpretation of such information and data should be left to medically-trained personnel. Information provided to patient can be located elsewhere in the medical record under "Patient Instructions". Document created using STT-dictation technology, any transcriptional errors that may result from process are unintentional.    Contact & Pharmacy Preferred: [phone] Home: [phone] (home) Mobile: [phone] (mobile) E-mail: No e-mail address on record  [facility] [place], [place] - [place] [address] [place] [address] Phone: [phone] Fax: [phone]   Pre-screening  [name] offered "in-person" vs "virtual" encounter. He indicated preferring virtual for this encounter.   Reason COVID-19*  Social distancing based on CDC and AMA recommendations.   I contacted [name] on 10/12/2019 via video conference.      I clearly identified myself as Gillis Santa, MD. I verified that I was speaking with the correct person using two identifiers (Name: [name], and date of birth: 01-25-1941).  Consent I sought verbal advanced consent from [name] for virtual visit interactions. I informed [name] of possible security and privacy concerns, risks, and limitations associated with providing "not-in-person" medical evaluation and management services. I also informed [name] of the availability of "in-person"  appointments. Finally, I informed him that there would be a charge for the virtual visit and that he could be  personally, fully or partially, financially responsible for it. Mr. Bostic expressed understanding and agreed to proceed.   Historic Elements   [name] is a 79 y.o. year old, male patient evaluated today after his last contact with our practice on 10/07/2019. [name]  has a past medical history of Anxiety, Bipolar disorder ([place]), Cataract, Chronic depression, Degenerative lumbar disc, Hypercholesteremia, Hypertension, Malignant neoplasm of prostate ([place]), Peptic esophagitis, Peripheral neuropathy, Squamous cell carcinoma of left tonsil ([place]), Throat cancer ([place]), and Thyroid disease. He also  has a past surgical history that includes Tonsillectomy; Inguinal hernia repair; Eye surgery; Cardiac electrophysiology study and ablation; Prostate biopsy; SEBACEOUS CYST REMOVAL; Esophagogastroduodenoscopy (06/15/2010); and Colonoscopy (04/07/2007). [name] has a current medication list which includes the following prescription(s): amlodipine, clonidine, famotidine, furosemide, gabapentin, ibuprofen, levothyroxine, metoprolol succinate, pantoprazole, trazodone, levothyroxine, and tizanidine. He  reports that he quit smoking about 10 years ago. He has never used smokeless tobacco. He reports previous alcohol use. He reports that he does not use drugs. [name] is allergic to trileptal [oxcarbazepine].   HPI  Today, he is being contacted for medication management.   Patient states that he is not noticing any significant benefit with hydrocodone in managing his pain.  He states that it is resulting in sedation, drowsiness, mood changes.  He states that he has been dealing with his cervical spine and bilateral arm pain for the last 2 years as a result of his accident.  The patient has tried high-dose tramadol, hydrocodone, oxycodone, buprenorphine without any significant analgesic  benefit however with side effects of sedation, drowsiness.  Do not recommend any further opioid therapy.  Recommend patient try and manage his pain on nonopioid analgesics, home exercises.  Given his mood change and possible symptoms of depression, will refer patient to Dr Shea Evans for psych management.   UDS:  Summary  Date Value Ref Range Status  07/21/2019 Note  Final    Comment:    ==================================================================== Compliance Drug Analysis, Ur ==================================================================== Test                             Result       Flag       Units Drug Present and Declared for Prescription Verification   Tramadol                       1938         EXPECTED   ng/mg creat   O-Desmethyltramadol            >5435        EXPECTED   ng/mg creat   N-Desmethyltramadol            775          EXPECTED   ng/mg creat    Source of tramadol is a prescription medication. O-desmethyltramadol    and N-desmethyltramadol are expected metabolites of tramadol.   Gabapentin                     PRESENT      EXPECTED Drug Present not Declared for Prescription Verification   Acetaminophen                  PRESENT      UNEXPECTED Drug Absent but Declared for Prescription Verification   Trazodone                      Not Detected UNEXPECTED   Lidocaine                      Not Detected UNEXPECTED    Lidocaine, as indicated in the declared medication list, is not    always detected even when used as directed.   Metoprolol                     Not Detected UNEXPECTED ==================================================================== Test                      Result    Flag   Units      Ref Range   Creatinine              92               mg/dL      >=20 ==================================================================== Declared Medications:  The flagging and interpretation on this report are based on the  following declared medications.   Unexpected results may arise from  inaccuracies in the declared medications.  **Note: The testing scope of this panel includes these medications:  Gabapentin  Metoprolol  Tramadol  Trazodone  **Note: The testing scope of this panel does not include small to  moderate amounts of these reported medications:  Lidocaine  **Note: The testing scope of this panel does not include the  following reported medications:  Amlodipine  Furosemide  Levothyroxine  Pantoprazole  Simvastatin ==================================================================== For clinical consultation, please call [phone]. ====================================================================    Laboratory Chemistry Profile   Renal Lab Results  Component Value Date  BUN 19 12/16/2018   CREATININE 1.45 (H) 12/16/2018   BCR 13 12/16/2018   GFRAA 54 (L) 12/16/2018   GFRNONAA 46 (L) 12/16/2018     Hepatic Lab Results  Component Value Date   AST 31 12/16/2018   ALT 26 12/16/2018   ALBUMIN 4.6 12/16/2018   ALKPHOS 81 12/16/2018     Electrolytes Lab Results  Component Value Date   NA 141 12/16/2018   K 4.6 12/16/2018   CL 104 12/16/2018   CALCIUM 9.4 12/16/2018     Bone Lab Results  Component Value Date   VD25OH 35.5 12/16/2018     Inflammation (CRP: Acute Phase) (ESR: Chronic Phase) Lab Results  Component Value Date   CRP 4 12/16/2018   ESRSEDRATE 3 12/16/2018       Note: Above Lab results reviewed.  Imaging  DG ESOPHAGUS W DOUBLE CM (HD) CLINICAL DATA:  Esophageal dysphagia.  Odynophagia.  EXAM: ESOPHOGRAM / BARIUM SWALLOW / BARIUM TABLET STUDY  TECHNIQUE: Combined double contrast and single contrast examination performed using effervescent crystals, thick barium liquid, and thin barium liquid. The patient was observed with fluoroscopy swallowing a 13 mm barium sulphate tablet.  FLUOROSCOPY TIME:  Fluoroscopy Time:  2 minutes and 12 seconds  Radiation Exposure Index (if  provided by the fluoroscopic device): 27.8  Number of Acquired Spot Images: 0  COMPARISON:  Chest CT of 12/14/2017 from [facility].  FINDINGS: Evaluation of the hypopharynx demonstrates a prominent cricopharyngeus muscle and a pre cricoid impression. Small Zenker's diverticulum.  Double contrast evaluation of the esophagus demonstrates no mucosal abnormality.  During the double contrast portion exam, it became apparent that the patient was having silent aspiration. Therefore, decision was made to not lay patient prone or evaluate primary peristalsis. Full column evaluation was performed with the patient upright.  Given this mild limitation, no evidence of esophageal stricture identified. No hiatal hernia.  A 13 mm barium tablet passes promptly.  IMPRESSION: 1. Silent aspiration with successive swallows during double-contrast portion of exam. 2. Secondary to this fact, the study was performed entirely upright and esophageal peristalsis was not evaluated. 3. Given this limitation, no evidence of esophageal stricture or anatomic cause for patient's symptoms identified. 4. Small Zenker's diverticulum. 5. Consider speech pathology evaluation.  These results will be called to the ordering clinician or representative by the Radiologist Assistant, and communication documented in the PACS or zVision Dashboard.  Electronically Signed   By: Abigail Miyamoto M.D.   On: 07/17/2019 11:06  Assessment  The primary encounter diagnosis was Cervical radicular pain. Diagnoses of Dementia without behavioral disturbance, unspecified dementia type ([place]), Whiplash injury syndrome, sequela, Chronic pain due to trauma, Reactive depression, and Mood change were also pertinent to this visit.  Plan of Care  Mr. Kyran Connaughton Dubiel [name] has a current medication list which includes the following long-term medication(s): famotidine, furosemide, gabapentin, and metoprolol succinate.  Pharmacotherapy  (Medications Ordered): Meds ordered this encounter  Medications  . tiZANidine (ZANAFLEX) 4 MG tablet    Sig: Take 0.5-1 tablets (2-4 mg total) by mouth at bedtime as needed for muscle spasms.    Dispense:  30 tablet    Refill:  2    Do not place this medication, or any other prescription from our practice, on "Automatic Refill". Patient may have prescription filled one day early if pharmacy is closed on scheduled refill date.   Orders:  Orders Placed This Encounter  Procedures  . Ambulatory referral to Psychiatry    Referral  Priority:   Routine    Referral Type:   Psychiatric    Referral Reason:   Specialty Services Required    Referred to Provider:   Ursula Alert, MD    Requested Specialty:   Psychiatry    Number of Visits Requested:   1   Follow-up plan:   Return in about 3 months (around 01/12/2020) for Medication Management, in person.     Failed oxycodone, hydrocodone, tramadol, buprenorphine, [place]. Continue  gabapentin to 600 mg 3 times daily      Recent Visits Date Type Provider Dept  10/01/19 Telemedicine Gillis Santa, MD [facility]  08/31/19 Office Visit Gillis Santa, MD [facility]  08/24/19 Office Visit Gillis Santa, MD [facility]  07/27/19 Office Visit Gillis Santa, MD [facility]  07/16/19 Office Visit Gillis Santa, MD [facility]  Showing recent visits within past 90 days and meeting all other requirements   Today's Visits Date Type Provider Dept  10/12/19 Telemedicine Gillis Santa, MD [facility]  Showing today's visits and meeting all other requirements   Future Appointments Date Type Provider Dept  10/28/19 Appointment Gillis Santa, MD [facility]  Showing future appointments within next 90 days and meeting all other requirements   I discussed the assessment and treatment plan with the patient. The patient was provided an opportunity to ask questions and all were answered. The  patient agreed with the plan and demonstrated an understanding of the instructions.  Patient advised to call back or seek an in-person evaluation if the symptoms or condition worsens.  Duration of encounter: 3mnutes.  Note by: BGillis Santa MD Date: 10/12/2019; Time: 1:00 PM

## 2019-10-15 ENCOUNTER — Telehealth: Payer: Medicare Other | Admitting: Student in an Organized Health Care Education/Training Program

## 2019-10-15 ENCOUNTER — Other Ambulatory Visit: Payer: Self-pay

## 2019-10-15 ENCOUNTER — Ambulatory Visit (INDEPENDENT_AMBULATORY_CARE_PROVIDER_SITE_OTHER): Payer: Medicare Other

## 2019-10-15 ENCOUNTER — Encounter: Payer: Self-pay | Admitting: Gastroenterology

## 2019-10-15 ENCOUNTER — Ambulatory Visit (INDEPENDENT_AMBULATORY_CARE_PROVIDER_SITE_OTHER): Payer: Medicare Other | Admitting: Gastroenterology

## 2019-10-15 VITALS — BP 114/62 | HR 60 | Temp 97.5°F | Ht 68.0 in | Wt 200.5 lb

## 2019-10-15 DIAGNOSIS — R1310 Dysphagia, unspecified: Secondary | ICD-10-CM | POA: Diagnosis not present

## 2019-10-15 DIAGNOSIS — R011 Cardiac murmur, unspecified: Secondary | ICD-10-CM | POA: Diagnosis not present

## 2019-10-15 DIAGNOSIS — I10 Essential (primary) hypertension: Secondary | ICD-10-CM | POA: Diagnosis not present

## 2019-10-15 LAB — ECHOCARDIOGRAM COMPLETE
Height: 68 in
Weight: 3208 oz

## 2019-10-15 MED ORDER — PANTOPRAZOLE SODIUM 40 MG PO TBEC: 40.0000 mg | DELAYED_RELEASE_TABLET | Freq: Two times a day (BID) | ORAL | 6 refills | Status: DC

## 2019-10-15 NOTE — Progress Notes (Signed)
Chief Complaint: Dysphagia  Referring Provider:  [name], MD      ASSESSMENT AND PLAN;   #1. GERD with dysphagia (likely oropharyngeal). H/O XRT to the neck.  Ba swallow- small Zenker's and silent aspiration. EGD with dil 07/2019 50Fr, small HH  #2. 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  Did require PEG at that time. Neg recent ENT eval (Dr Gaylyn Cheers).  No further Onc FUs needed as per [facility] (Dr. Bobby Rumpf)  #3. Chronic constipation.  Likely exacerbated by medications including Ca channel blockers. Better with 2/day stool softeners.  Plan: - Increase Protonix 40mg  po bid, 60, 6 refills - Kyle @ [facility] speech therapy -Take pills upright position.  Aspiration precautions. - Ba swallow in 6 months - FU after Ba swallow. - Discussed in detail with the patient and patient's wife.   HPI:    [name] is a 79 y.o. male  Accompanied by his wife Known to me from [place].    S/P barium swallow followed by EGD with dilatation as detailed below.  Had about 50% improvement.  Still having some problems with dysphagia mostly in the neck area.  Has not lost anymore weight.  In fact, he has gained some weight since dilatation.  Had 1 likely asp pneumonia April 2021, didn't get adm, took PO antibiotics.  None since.  Denies having any significant nausea, vomiting or odynophagia.  Has history of longstanding chronic constipation.  Has to take 1 stool softener per day.  Still would have bowel movements at the frequency of once per week.  Hard stool without melena or hematochezia.  Does have some abdominal bloating.  Wants to hold off on colonoscopy until upper GI symptoms are better.  For his tonsillary carcinoma, he has been followed at [facility] by Dr. Bobby Rumpf.  Per patient " Dr. Bobby Rumpf has turned him loose" as his follow-up CTs/PET scan were all negative for recurrence.  Past GI procedures:  -Colonoscopy 03/2007: Colonic polyp SP polypectomy, pancolonic  diverticulosis. Bx- TA.  Wanted to hold off on colonoscopy.  Ba Swallow 07/2019 IMPRESSION: 1. Silent aspiration with successive swallows during double-contrast portion of exam. 2. Secondary to this fact, the study was performed entirely upright and esophageal peristalsis was not evaluated. 3. Given this limitation, no evidence of esophageal stricture or anatomic cause for patient's symptoms identified. 4. Small Zenker's diverticulum. 5. Consider speech pathology evaluation.  EGD 07/2019 - Benign-appearing esophageal stenosis with surrounding esophagitis. Biopsied and dilated. - Small hiatal hernia. - Gastritis. Biopsied.  -EGD with PEG 06/2010: Erosive esophagitis, s/p 24FR PEG placement  Wt Readings from Last 3 Encounters:  10/15/19 200 lb 8 oz (90.9 kg)  09/28/19 201 lb 6.4 oz (91.4 kg)  08/24/19 199 lb (90.3 kg)    Past Medical History:  Diagnosis Date  . Anxiety   . Bipolar disorder ([place])    w/dementia/psychotic episodes  . Cataract   . Chronic depression   . Degenerative lumbar disc   . Hypercholesteremia   . Hypertension   . Malignant neoplasm of prostate ([place])   . Peptic esophagitis   . Peripheral neuropathy   . Squamous cell carcinoma of left tonsil ([facility])   . Throat cancer ([place])   . Thyroid disease     Past Surgical History:  Procedure Laterality Date  . CARDIAC ELECTROPHYSIOLOGY STUDY AND ABLATION    . COLONOSCOPY  04/07/2007   Small colonic polyp status post polypectomy. Pancolonic diverticulosis predominantly in the sigmoid colon.  Internal hemorrhoids.   . ESOPHAGOGASTRODUODENOSCOPY  06/15/2010   Erosive esophagitis. Status post PEG placment  . EYE SURGERY    . INGUINAL HERNIA REPAIR    . PROSTATE BIOPSY    . SEBACEOUS CYST REMOVAL    . TONSILLECTOMY      Family History  Problem Relation Age of Onset  . Other Mother        brain tumor - unsure if it was cancer  . Heart disease Father   . Prostate cancer Father   . Stomach cancer Father   .  Colon cancer Neg Hx   . Esophageal cancer Neg Hx   . Rectal cancer Neg Hx     Social History   Tobacco Use  . Smoking status: Former Smoker    Quit date: 2011    Years since quitting: 10.3  . Smokeless tobacco: Former Network engineer Use Topics  . Alcohol use: Not Currently  . Drug use: Never    Current Outpatient Medications  Medication Sig Dispense Refill  . amLODipine (NORVASC) 10 MG tablet Take 10 mg by mouth daily.    . cloNIDine (CATAPRES) 0.1 MG tablet Take 0.1 mg by mouth daily.    . famotidine (PEPCID) 40 MG tablet Take 40 mg by mouth daily.    . furosemide (LASIX) 20 MG tablet Take 20 mg by mouth daily as needed.    . gabapentin (NEURONTIN) 300 MG capsule Take 2 capsules (600 mg total) by mouth 3 (three) times daily. 180 capsule 1  . HYDROcodone-acetaminophen (NORCO) 10-325 MG tablet Take 1 tablet by mouth 3 (three) times daily.    Marland Kitchen ibuprofen (ADVIL) 200 MG tablet Take 200 mg by mouth every 6 (six) hours as needed.    Marland Kitchen levothyroxine (SYNTHROID) 112 MCG tablet TAKE 1 TABLET BY MOUTH ONCE DAILY FOR 4 WEEKS THEN RECHECK THYROID PANEL    . metoprolol succinate (TOPROL-XL) 25 MG 24 hr tablet Take 25 mg by mouth daily.    . pantoprazole (PROTONIX) 40 MG tablet Take 40 mg by mouth 2 (two) times daily.    Marland Kitchen tiZANidine (ZANAFLEX) 4 MG tablet Take 0.5-1 tablets (2-4 mg total) by mouth at bedtime as needed for muscle spasms. 30 tablet 2  . traZODone (DESYREL) 50 MG tablet Take 50 mg by mouth at bedtime. Can take 1-2     No current facility-administered medications for this visit.    Allergies  Allergen Reactions  . Trileptal [Oxcarbazepine] Rash    Review of Systems:  neg     Physical Exam:    Temp (!) 97.5 F (36.4 C)   Ht 5\' 8"  (1.727 m)   Wt 200 lb 8 oz (90.9 kg)   BMI 30.49 kg/m  Wt Readings from Last 3 Encounters:  10/15/19 200 lb 8 oz (90.9 kg)  09/28/19 201 lb 6.4 oz (91.4 kg)  08/24/19 199 lb (90.3 kg)   Constitutional:  Well-developed, in no acute  distress. Psychiatric: Normal mood and affect. Behavior is normal. HEENT: Pupils normal.  Conjunctivae are normal. No scleral icterus.  Well-healed surgical scars. Neck supple.  Cardiovascular: Normal rate, regular rhythm. No edema Pulmonary/chest: Effort normal and breath sounds normal. No wheezing, rales or rhonchi. Abdominal: Soft, nondistended. Nontender. Bowel sounds active throughout. There are no masses palpable. No hepatomegaly. Rectal:  defered Neurological: Alert and oriented to person place and time. Skin: Skin is warm and dry. No rashes noted.  Data Reviewed: I have personally reviewed following labs and imaging studies  CBC:  CBC Latest Ref Rng & Units 12/16/2018 09/19/2007 09/18/2007  WBC 3.4 - 10.8 x10E3/uL 6.3 - -  Hemoglobin 13.0 - 17.7 g/dL 14.6 13.9 12.7(L)  Hematocrit 37.5 - 51.0 % 41.7 40.2 38.2(L)  Platelets 150 - 450 x10E3/uL 182 - -    CMP: CMP Latest Ref Rng & Units 12/16/2018 09/15/2007  Glucose 65 - 99 mg/dL 89 113(H)  BUN 8 - 27 mg/dL 19 8  Creatinine 0.76 - 1.27 mg/dL 1.45(H) 1.09  Sodium 134 - 144 mmol/L 141 138  Potassium 3.5 - 5.2 mmol/L 4.6 3.8  Chloride 96 - 106 mmol/L 104 103  CO2 20 - 29 mmol/L 22 28  Calcium 8.6 - 10.2 mg/dL 9.4 9.1  Total Protein 6.0 - 8.5 g/dL 6.5 -  Total Bilirubin 0.0 - 1.2 mg/dL 0.3 -  Alkaline Phos 39 - 117 IU/L 81 -  AST 0 - 40 IU/L 31 -  ALT 0 - 44 IU/L 26 -      Carmell Austria, MD 10/15/2019, 11:39 AM  Cc: [name], MD

## 2019-10-15 NOTE — Patient Instructions (Signed)
We have sent the following medications to your pharmacy for you to pick up at your convenience: Protonix  We will contact kyle at [facility] for speech therapy  We will contact you to schedule a Barium Swallow study in 6 months  Thank you,  Dr. Jackquline Denmark

## 2019-10-21 NOTE — Telephone Encounter (Signed)
The patients wife called back and said her husband is not taking his medicines properly and he told a story to her about that. There were a lot of questions and concerns so I just scheduled him a virtual appt to speak to Dr. Holley Raring and get it all straightened out. She also said he needs to be referred to the phys doctor as well.

## 2019-10-28 ENCOUNTER — Telehealth: Payer: Medicare Other | Admitting: Student in an Organized Health Care Education/Training Program

## 2019-10-28 ENCOUNTER — Telehealth: Payer: Self-pay | Admitting: Student in an Organized Health Care Education/Training Program

## 2019-10-28 NOTE — Telephone Encounter (Signed)
This referral was sent to wrong office, was sent to Dr. Shea Evans office in [place]. They are the ones that denied.

## 2019-10-28 NOTE — Telephone Encounter (Signed)
Patient wife called today stating her husband is becoming very agitated and upset, states nothing is helping his pain. Dr. Holley Raring has told them he would get an appt with Dr. Shea Evans for help.  Mrs. Senn is concerned he may try to hurt himself. We need referral put in please.

## 2019-10-28 NOTE — Telephone Encounter (Signed)
Please do this

## 2019-10-28 NOTE — Telephone Encounter (Signed)
Please call psychiatry office downstairs and ask status of referral.  Referral was placed at the beginning of this month.

## 2019-10-29 ENCOUNTER — Telehealth: Payer: Medicare Other | Admitting: Student in an Organized Health Care Education/Training Program

## 2019-10-29 DIAGNOSIS — R351 Nocturia: Secondary | ICD-10-CM | POA: Diagnosis not present

## 2019-10-29 DIAGNOSIS — N3946 Mixed incontinence: Secondary | ICD-10-CM | POA: Diagnosis not present

## 2019-10-30 ENCOUNTER — Telehealth: Payer: Self-pay | Admitting: Student in an Organized Health Care Education/Training Program

## 2019-10-30 NOTE — Telephone Encounter (Signed)
They will need to schedule an appointment with Dr Holley Raring to discuss medications.  His last note states that he does not recommend any further opioid therapy and that he needed a psych referral.

## 2019-10-30 NOTE — Telephone Encounter (Signed)
He has appt on Thurs 5-27. Wife stated he has appt with psychiatrist. Juluis Rainier

## 2019-10-30 NOTE — Telephone Encounter (Signed)
Wife lvmail statin patient will be out of pain meds by Monday, can this be sent in please.  She has set patient up with psychiatrist appt. Says he is going downhill and loosing weight extremely fast.  Can cancel the psych referral from here.

## 2019-11-04 DIAGNOSIS — F411 Generalized anxiety disorder: Secondary | ICD-10-CM | POA: Diagnosis not present

## 2019-11-04 DIAGNOSIS — F314 Bipolar disorder, current episode depressed, severe, without psychotic features: Secondary | ICD-10-CM | POA: Diagnosis not present

## 2019-11-04 DIAGNOSIS — G4700 Insomnia, unspecified: Secondary | ICD-10-CM | POA: Diagnosis not present

## 2019-11-05 ENCOUNTER — Telehealth: Payer: Self-pay | Admitting: *Deleted

## 2019-11-05 ENCOUNTER — Ambulatory Visit
Payer: Medicare Other | Attending: Student in an Organized Health Care Education/Training Program | Admitting: Student in an Organized Health Care Education/Training Program

## 2019-11-05 ENCOUNTER — Other Ambulatory Visit: Payer: Self-pay

## 2019-11-05 DIAGNOSIS — M5412 Radiculopathy, cervical region: Secondary | ICD-10-CM

## 2019-11-05 DIAGNOSIS — M5030 Other cervical disc degeneration, unspecified cervical region: Secondary | ICD-10-CM | POA: Diagnosis not present

## 2019-11-05 DIAGNOSIS — G629 Polyneuropathy, unspecified: Secondary | ICD-10-CM

## 2019-11-05 DIAGNOSIS — G8921 Chronic pain due to trauma: Secondary | ICD-10-CM

## 2019-11-05 MED ORDER — HYDROCODONE-ACETAMINOPHEN 10-325 MG PO TABS: 1.0000 | ORAL_TABLET | Freq: Four times a day (QID) | ORAL | 0 refills | Status: AC | PRN
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
## 2019-11-11 DIAGNOSIS — E039 Hypothyroidism, unspecified: Secondary | ICD-10-CM | POA: Diagnosis not present

## 2019-11-26 ENCOUNTER — Other Ambulatory Visit: Payer: Self-pay

## 2019-11-26 ENCOUNTER — Encounter: Payer: Self-pay | Admitting: Cardiology

## 2019-11-26 ENCOUNTER — Ambulatory Visit (INDEPENDENT_AMBULATORY_CARE_PROVIDER_SITE_OTHER): Payer: Medicare Other | Admitting: Cardiology

## 2019-11-26 VITALS — BP 150/74 | HR 66 | Wt 197.0 lb

## 2019-11-26 DIAGNOSIS — I10 Essential (primary) hypertension: Secondary | ICD-10-CM

## 2019-11-26 DIAGNOSIS — I350 Nonrheumatic aortic (valve) stenosis: Secondary | ICD-10-CM

## 2019-11-26 HISTORY — DX: Nonrheumatic aortic (valve) stenosis: I35.0

## 2019-11-26 NOTE — Progress Notes (Signed)
Cardiology Office Note:    Date:  11/26/2019   ID:  [name], DOB 23-Nov-1940, MRN [id number]  PCP:  [name], MD  Cardiologist:  Jenean Lindau, MD   Referring MD: [name], MD    ASSESSMENT:    1. Essential hypertension   2. Mild aortic stenosis    PLAN:    In order of problems listed above:  1. Essential hypertension: Primary prevention stressed with the patient.  Importance of compliance with diet medication stressed and he vocalized understanding.  Salt intake issues were discussed.  He is happy about the fact that his blood pressures are stable. 2. Mild aortic stenosis: Medical management at this time finding discussed with the patient at length.  Ejection fraction normal. 3. Patient will be seen in follow-up appointment in 6 months or earlier if the patient has any concerns    Medication Adjustments/Labs and Tests Ordered: Current medicines are reviewed at length with the patient today.  Concerns regarding medicines are outlined above.  No orders of the defined types were placed in this encounter.  No orders of the defined types were placed in this encounter.    Chief Complaint  Patient presents with  . Follow-up     History of Present Illness:    [name] is a 79 y.o. male.  Patient has past medical history of essential hypertension.  He denies any problems at this time and takes care of activities of daily living.  No chest pain orthopnea or PND.  At the time of my evaluation, the patient is alert awake oriented and in no distress.  He mentions to me that his blood pressure is very well regulated and is happy about it.  He has brought home blood pressure readings in the range of 130/70 and he is satisfied about it.  Past Medical History:  Diagnosis Date  . Anxiety   . Bipolar disorder ([place])    w/dementia/psychotic episodes  . Cataract   . Chronic depression   . Degenerative lumbar disc   . Hypercholesteremia   .  Hypertension   . Malignant neoplasm of prostate ([place])   . Peptic esophagitis   . Peripheral neuropathy   . Squamous cell carcinoma of left tonsil ([facility])   . Throat cancer ([place])   . Thyroid disease     Past Surgical History:  Procedure Laterality Date  . CARDIAC ELECTROPHYSIOLOGY STUDY AND ABLATION    . COLONOSCOPY  04/07/2007   Small colonic polyp status post polypectomy. Pancolonic diverticulosis predominantly in the sigmoid colon. Internal hemorrhoids.   . ESOPHAGOGASTRODUODENOSCOPY  06/15/2010   Erosive esophagitis. Status post PEG placment  . EYE SURGERY    . INGUINAL HERNIA REPAIR    . PROSTATE BIOPSY    . SEBACEOUS CYST REMOVAL    . TONSILLECTOMY      Current Medications: Current Meds  Medication Sig  . amLODipine (NORVASC) 10 MG tablet Take 10 mg by mouth daily.  Marland Kitchen buPROPion (WELLBUTRIN SR) 100 MG 12 hr tablet Take 100 mg by mouth every morning.  . cloNIDine (CATAPRES) 0.1 MG tablet Take 0.1 mg by mouth daily.  . famotidine (PEPCID) 40 MG tablet Take 40 mg by mouth daily.  . furosemide (LASIX) 20 MG tablet Take 20 mg by mouth daily as needed.  Marland Kitchen HYDROcodone-acetaminophen (NORCO) 10-325 MG tablet Take 1 tablet by mouth every 6 (six) hours as needed.  Derrill Memo ON 12/05/2019] HYDROcodone-acetaminophen (NORCO) 10-325 MG tablet Take 1  tablet by mouth every 6 (six) hours as needed.  Marland Kitchen ibuprofen (ADVIL) 200 MG tablet Take 200 mg by mouth every 6 (six) hours as needed.  Marland Kitchen levothyroxine (SYNTHROID) 125 MCG tablet Take 125 mcg by mouth daily.  . metoprolol succinate (TOPROL-XL) 25 MG 24 hr tablet Take 25 mg by mouth daily.  . pantoprazole (PROTONIX) 40 MG tablet Take 40 mg by mouth 2 (two) times daily.  . pantoprazole (PROTONIX) 40 MG tablet Take 1 tablet (40 mg total) by mouth 2 (two) times daily.  . QUEtiapine (SEROQUEL) 25 MG tablet Take 25 mg by mouth at bedtime.  . traZODone (DESYREL) 50 MG tablet Take 50 mg by mouth at bedtime. Can take 1-2     Allergies:   Trileptal  [oxcarbazepine]   Social History   Socioeconomic History  . Marital status: Married    Spouse name: Not on file  . Number of children: 3  . Years of education: 10th grade  . Highest education level: Not on file  Occupational History  . Occupation: Retired  Tobacco Use  . Smoking status: Former Smoker    Quit date: 2011    Years since quitting: 10.4  . Smokeless tobacco: Former Network engineer  . Vaping Use: Never used  Substance and Sexual Activity  . Alcohol use: Not Currently  . Drug use: Never  . Sexual activity: Not on file  Other Topics Concern  . Not on file  Social History Narrative   Lives at home with wife.   Right-handed.   2 cups of caffeine per day.   Social Determinants of Health   Financial Resource Strain:   . Difficulty of Paying Living Expenses:   Food Insecurity:   . Worried About Charity fundraiser in the Last Year:   . Arboriculturist in the Last Year:   Transportation Needs:   . Film/video editor (Medical):   Marland Kitchen Lack of Transportation (Non-Medical):   Physical Activity:   . Days of Exercise per Week:   . Minutes of Exercise per Session:   Stress:   . Feeling of Stress :   Social Connections:   . Frequency of Communication with Friends and Family:   . Frequency of Social Gatherings with Friends and Family:   . Attends Religious Services:   . Active Member of Clubs or Organizations:   . Attends Archivist Meetings:   Marland Kitchen Marital Status:      Family History: The patient's family history includes Heart disease in his father; Other in his mother; Prostate cancer in his father; Stomach cancer in his father. There is no history of Colon cancer, Esophageal cancer, or Rectal cancer.  ROS:   Please see the history of present illness.    All other systems reviewed and are negative.  EKGs/Labs/Other Studies Reviewed:    The following studies were reviewed today:  IMPRESSIONS    1. Left ventricular ejection fraction, by  estimation, is 60 to 65%. The  left ventricle has normal function. The left ventricle has no regional  wall motion abnormalities. Left ventricular diastolic parameters were  normal.  2. Right ventricular systolic function is normal. The right ventricular  size is normal.  3. The mitral valve is normal in structure. Mild mitral valve  regurgitation. No evidence of mitral stenosis.  4. The aortic valve is normal in structure. Aortic valve regurgitation is  trivial. Mild aortic valve stenosis. Aortic valve area, by VTI measures  1.66 cm.  Aortic valve mean gradient measures 16.0 mmHg.  5. The inferior vena cava is normal in size with greater than 50%  respiratory variability, suggesting right atrial pressure of 3 mmHg.    Recent Labs: 12/16/2018: ALT 26; BUN 19; Creatinine, Ser 1.45; Hemoglobin 14.6; Platelets 182; Potassium 4.6; Sodium 141; TSH 2.850  Recent Lipid Panel No results found for: CHOL, TRIG, HDL, CHOLHDL, VLDL, LDLCALC, LDLDIRECT  Physical Exam:    VS:  BP (!) 150/74   Pulse 66   Wt 197 lb (89.4 kg)   SpO2 97%   BMI 29.95 kg/m     Wt Readings from Last 3 Encounters:  11/26/19 197 lb (89.4 kg)  10/15/19 200 lb 8 oz (90.9 kg)  09/28/19 201 lb 6.4 oz (91.4 kg)     GEN: Patient is in no acute distress HEENT: Normal NECK: No JVD; No carotid bruits LYMPHATICS: No lymphadenopathy CARDIAC: Hear sounds regular, 2/6 systolic murmur at the apex. RESPIRATORY:  Clear to auscultation without rales, wheezing or rhonchi  ABDOMEN: Soft, non-tender, non-distended MUSCULOSKELETAL:  No edema; No deformity  SKIN: Warm and dry NEUROLOGIC:  Alert and oriented x 3 PSYCHIATRIC:  Normal affect   Signed, Jenean Lindau, MD  11/26/2019 3:11 PM    [facility]

## 2019-11-26 NOTE — Patient Instructions (Signed)

## 2019-12-02 DIAGNOSIS — F314 Bipolar disorder, current episode depressed, severe, without psychotic features: Secondary | ICD-10-CM | POA: Diagnosis not present

## 2019-12-02 DIAGNOSIS — G4700 Insomnia, unspecified: Secondary | ICD-10-CM | POA: Diagnosis not present

## 2019-12-29 ENCOUNTER — Encounter: Payer: Medicare Other | Admitting: Student in an Organized Health Care Education/Training Program

## 2020-01-05 ENCOUNTER — Encounter: Payer: Medicare Other | Admitting: Student in an Organized Health Care Education/Training Program

## 2020-01-07 ENCOUNTER — Encounter: Payer: Medicare Other | Admitting: Student in an Organized Health Care Education/Training Program

## 2020-01-15 DIAGNOSIS — F314 Bipolar disorder, current episode depressed, severe, without psychotic features: Secondary | ICD-10-CM | POA: Diagnosis not present

## 2020-01-15 DIAGNOSIS — G4700 Insomnia, unspecified: Secondary | ICD-10-CM | POA: Diagnosis not present

## 2020-01-19 ENCOUNTER — Encounter: Payer: Self-pay | Admitting: Student in an Organized Health Care Education/Training Program

## 2020-01-19 ENCOUNTER — Ambulatory Visit
Payer: Medicare Other | Attending: Student in an Organized Health Care Education/Training Program | Admitting: Student in an Organized Health Care Education/Training Program

## 2020-01-19 ENCOUNTER — Other Ambulatory Visit: Payer: Self-pay

## 2020-01-19 VITALS — BP 147/51 | HR 71 | Temp 98.1°F | Resp 18 | Ht 68.0 in | Wt 203.0 lb

## 2020-01-19 DIAGNOSIS — F0390 Unspecified dementia without behavioral disturbance: Secondary | ICD-10-CM

## 2020-01-19 DIAGNOSIS — G629 Polyneuropathy, unspecified: Secondary | ICD-10-CM

## 2020-01-19 DIAGNOSIS — R4586 Emotional lability: Secondary | ICD-10-CM

## 2020-01-19 DIAGNOSIS — S134XXS Sprain of ligaments of cervical spine, sequela: Secondary | ICD-10-CM | POA: Diagnosis not present

## 2020-01-19 DIAGNOSIS — F329 Major depressive disorder, single episode, unspecified: Secondary | ICD-10-CM | POA: Diagnosis not present

## 2020-01-19 DIAGNOSIS — M5412 Radiculopathy, cervical region: Secondary | ICD-10-CM

## 2020-01-19 DIAGNOSIS — G8921 Chronic pain due to trauma: Secondary | ICD-10-CM

## 2020-01-19 DIAGNOSIS — M5030 Other cervical disc degeneration, unspecified cervical region: Secondary | ICD-10-CM

## 2020-01-19 MED ORDER — HYDROCODONE-ACETAMINOPHEN 10-325 MG PO TABS: 1.0000 | ORAL_TABLET | Freq: Four times a day (QID) | ORAL | 0 refills | Status: AC | PRN

## 2020-01-19 NOTE — Progress Notes (Signed)
Nursing Pain Medication Assessment:  Safety precautions to be maintained throughout the outpatient stay will include: orient to surroundings, keep bed in low position, maintain call bell within reach at all times, provide assistance with transfer out of bed and ambulation.  Medication Inspection Compliance: [name] did not comply with our request to bring his pills to be counted. He was reminded that bringing the medication bottles, even when empty, is a requirement.  Medication: None brought in. Pill/Patch Count: None available to be counted. Bottle Appearance: No container available. Did not bring bottle(s) to appointment. Filled Date: N/A Last Medication intake:  Today

## 2020-01-19 NOTE — Progress Notes (Signed)
PROVIDER NOTE: Information contained herein reflects review and annotations entered in association with encounter. Interpretation of such information and data should be left to medically-trained personnel. Information provided to patient can be located elsewhere in the medical record under "Patient Instructions". Document created using STT-dictation technology, any transcriptional errors that may result from process are unintentional.    Patient: [name]  Service Category: E/M  Provider: Gillis Santa, MD  DOB: [birth year]  DOS: 01/19/2020  Specialty: Interventional Pain Management  MRN: [id number]  Setting: Ambulatory outpatient  PCP: [name], MD  Type: Established Patient    Referring Provider: [name], MD  Location: Office  Delivery: Face-to-face     HPI  Reason for encounter: [name], a 79 y.o. year old male, is here today for evaluation and management of his Small fiber neuropathy [G62.9]. Mr. Helzer primary complain today is Neck Pain (left) and Back Pain (lower) Last encounter: Practice (11/05/2019). My last encounter with him was on 10/30/2019. Pertinent problems: Mr. Goree has Small fiber neuropathy; Chronic pain due to trauma; Whiplash injury syndrome, sequela; Cervical radicular pain; DDD (degenerative disc disease), cervical; Cervical spondylosis; and Peripheral neuropathy due to chemotherapy [facility]) on their pertinent problem list. Pain Assessment: Severity of Chronic pain is reported as a 6 /10. Location: Neck Left/neck pain to left shoulder, lower back pain to left leg. Onset: More than a month ago. Quality: Hervey Ard, Aching. Timing: Constant. Modifying factor(s): rest, medications. Vitals:  height is 5' 8"  (1.727 m) and weight is 203 lb (92.1 kg). His temporal temperature is 98.1 F (36.7 C). His blood pressure is 147/51 (abnormal) and his pulse is 71. His respiration is 18 and oxygen saturation is 98%.   No change in medical history since last  visit.  Patient's pain is at baseline.  Patient continues multimodal pain regimen as prescribed.  States that it provides pain relief and improvement in functional status.  Encourage patient and patient's wife to utilize stool softener and MiraLAX if he deals with constipation related to opioids.  If this is not helpful, I can also call in medication for opioid-induced constipation.  Patient and his wife state that they will get more aggressive with stool softener and MiraLAX administration.   Pharmacotherapy Assessment   11/05/2019  1   11/05/2019  Hydrocodone-Acetamin 10-325 MG  120.00  30 Bi Lat   [id number]   [facility] (3545)   0/0  40.00 MME  Medicare   [place]      Monitoring: [place] PMP: PDMP reviewed during this encounter.       Pharmacotherapy: No side-effects or adverse reactions reported. Compliance: No problems identified. Effectiveness: Clinically acceptable.  Landis Martins, RN  01/19/2020  1:59 PM  Sign when Signing Visit Nursing Pain Medication Assessment:  Safety precautions to be maintained throughout the outpatient stay will include: orient to surroundings, keep bed in low position, maintain call bell within reach at all times, provide assistance with transfer out of bed and ambulation.  Medication Inspection Compliance: Mr. Sherrow did not comply with our request to bring his pills to be counted. He was reminded that bringing the medication bottles, even when empty, is a requirement.  Medication: None brought in. Pill/Patch Count: None available to be counted. Bottle Appearance: No container available. Did not bring bottle(s) to appointment. Filled Date: N/A Last Medication intake:  Today    UDS:  Summary  Date Value Ref Range Status  07/21/2019 Note  Final    Comment:    ====================================================================  Compliance Drug Analysis, Ur ==================================================================== Test                             Result        Flag       Units Drug Present and Declared for Prescription Verification   Tramadol                       1940         EXPECTED   ng/mg creat   O-Desmethyltramadol            >5435        EXPECTED   ng/mg creat   N-Desmethyltramadol            775          EXPECTED   ng/mg creat    Source of tramadol is a prescription medication. O-desmethyltramadol    and N-desmethyltramadol are expected metabolites of tramadol.   Gabapentin                     PRESENT      EXPECTED Drug Present not Declared for Prescription Verification   Acetaminophen                  PRESENT      UNEXPECTED Drug Absent but Declared for Prescription Verification   Trazodone                      Not Detected UNEXPECTED   Lidocaine                      Not Detected UNEXPECTED    Lidocaine, as indicated in the declared medication list, is not    always detected even when used as directed.   Metoprolol                     Not Detected UNEXPECTED ==================================================================== Test                      Result    Flag   Units      Ref Range   Creatinine              92               mg/dL      >=20 ==================================================================== Declared Medications:  The flagging and interpretation on this report are based on the  following declared medications.  Unexpected results may arise from  inaccuracies in the declared medications.  **Note: The testing scope of this panel includes these medications:  Gabapentin  Metoprolol  Tramadol  Trazodone  **Note: The testing scope of this panel does not include small to  moderate amounts of these reported medications:  Lidocaine  **Note: The testing scope of this panel does not include the  following reported medications:  Amlodipine  Furosemide  Levothyroxine  Pantoprazole  Simvastatin ==================================================================== For clinical consultation, please call [phone]. ====================================================================      ROS  Constitutional: Denies any fever or chills Gastrointestinal: No reported hemesis, hematochezia, vomiting, or acute GI distress Musculoskeletal: Neck pain, bilateral shoulder pain Neurological: No reported episodes of acute onset apraxia, aphasia, dysarthria, agnosia, amnesia, paralysis, loss of coordination, or loss of consciousness  Medication Review  Docusate Sodium, HYDROcodone-acetaminophen, Milk Thistle, QUEtiapine, amLODipine, buPROPion, cloNIDine, famotidine, furosemide, ibuprofen, levothyroxine, metoprolol succinate, oxybutynin,  pantoprazole, polyethylene glycol, and traZODone  History Review  Allergy: Mr. Troiani is allergic to trileptal [oxcarbazepine]. Drug: Mr. Docken  reports no history of drug use. Alcohol:  reports previous alcohol use. Tobacco:  reports that he quit smoking about 10 years ago. He has quit using smokeless tobacco. Social: Mr. Kutzer  reports that he quit smoking about 10 years ago. He has quit using smokeless tobacco. He reports previous alcohol use. He reports that he does not use drugs. 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 Surgical: Mr. Earnhart  has a past surgical history that includes Tonsillectomy; Inguinal hernia repair; Eye surgery; Cardiac electrophysiology study and ablation; Prostate biopsy; SEBACEOUS CYST REMOVAL; Esophagogastroduodenoscopy (06/15/2010); and Colonoscopy (04/07/2007). Family: family history includes Heart disease in his father; Other in his mother; Prostate cancer in his father; Stomach cancer in his father.  Laboratory Chemistry Profile   Renal Lab Results   Component Value Date   BUN 19 12/16/2018   CREATININE 1.45 (H) 12/16/2018   BCR 13 12/16/2018   GFRAA 54 (L) 12/16/2018   GFRNONAA 46 (L) 12/16/2018     Hepatic Lab Results  Component Value Date   AST 31 12/16/2018   ALT 26 12/16/2018   ALBUMIN 4.6 12/16/2018   ALKPHOS 81 12/16/2018     Electrolytes Lab Results  Component Value Date   NA 141 12/16/2018   K 4.6 12/16/2018   CL 104 12/16/2018   CALCIUM 9.4 12/16/2018     Bone Lab Results  Component Value Date   VD25OH 35.5 12/16/2018     Inflammation (CRP: Acute Phase) (ESR: Chronic Phase) Lab Results  Component Value Date   CRP 4 12/16/2018   ESRSEDRATE 3 12/16/2018       Note: Above Lab results reviewed.  Recent Imaging Review  ECHOCARDIOGRAM COMPLETE    ECHOCARDIOGRAM REPORT       Patient Name:   Jaclyn Carew [name] Date of Exam: 10/15/2019 Medical Rec #:  [id number]              Height:       68.0 in Accession #:    [id number]             Weight:       201.4 lb Date of Birth:  10-Aug-1940               BSA:          2.050 m Patient Age:    5 years               BP:           114/62 mmHg Patient Gender: M                      HR:           72 bpm. Exam Location:  [place]  Procedure: 2D Echo, Cardiac Doppler and Color Doppler  Indications:    Murmur 785.2 / R01.1   History:        Patient has no prior history of Echocardiogram examinations.                 Previous Myocardial Infarction, Arrythmias:RBBB and Atrial  Fibrillation, Signs/Symptoms:Chest Pain and Murmur; Risk                 Factors:Hypertension and Former Smoker.   Sonographer:    Vickie Epley RDCS Referring Phys: [place] Ferrari [facility]  IMPRESSIONS   1. Left ventricular ejection fraction, by estimation, is 60 to 65%. The left ventricle has normal function. The left ventricle has no regional wall motion abnormalities. Left ventricular diastolic parameters were normal.  2. Right ventricular systolic function is normal. The  right ventricular size is normal.  3. The mitral valve is normal in structure. Mild mitral valve regurgitation. No evidence of mitral stenosis.  4. The aortic valve is normal in structure. Aortic valve regurgitation is trivial. Mild aortic valve stenosis. Aortic valve area, by VTI measures 1.66 cm. Aortic valve mean gradient measures 16.0 mmHg.  5. The inferior vena cava is normal in size with greater than 50% respiratory variability, suggesting right atrial pressure of 3 mmHg.  FINDINGS  Left Ventricle: Left ventricular ejection fraction, by estimation, is 60 to 65%. The left ventricle has normal function. The left ventricle has no regional wall motion abnormalities. The left ventricular internal cavity size was normal in size. There is  no left ventricular hypertrophy. Left ventricular diastolic parameters were normal.  Right Ventricle: The right ventricular size is normal. No increase in right ventricular wall thickness. Right ventricular systolic function is normal.  Left Atrium: Left atrial size was normal in size.  Right Atrium: Right atrial size was normal in size.  Pericardium: There is no evidence of pericardial effusion.  Mitral Valve: The mitral valve is normal in structure. Normal mobility of the mitral valve leaflets. Mild mitral valve regurgitation. No evidence of mitral valve stenosis.  Tricuspid Valve: The tricuspid valve is normal in structure. Tricuspid valve regurgitation is not demonstrated. No evidence of tricuspid stenosis.  Aortic Valve: The aortic valve is normal in structure.. There is mild thickening and mild calcification of the aortic valve. Aortic valve regurgitation is trivial. Aortic regurgitation PHT measures 524 msec. Mild aortic stenosis is present. There is mild  thickening of the aortic valve. There is mild calcification of the aortic valve. Aortic valve mean gradient measures 16.0 mmHg. Aortic valve peak gradient measures 31.4 mmHg. Aortic valve area, by  VTI measures 1.66 cm.  Pulmonic Valve: The pulmonic valve was normal in structure. Pulmonic valve regurgitation is not visualized. No evidence of pulmonic stenosis.  Aorta: The aortic root is normal in size and structure.  Venous: The inferior vena cava is normal in size with greater than 50% respiratory variability, suggesting right atrial pressure of 3 mmHg.  IAS/Shunts: No atrial level shunt detected by color flow Doppler.    LEFT VENTRICLE PLAX 2D LVIDd:         5.16 cm      Diastology LVIDs:         3.46 cm      LV e' lateral:   9.90 cm/s LV PW:         0.80 cm      LV E/e' lateral: 7.1 LV IVS:        0.80 cm      LV e' medial:    7.83 cm/s LVOT diam:     2.00 cm      LV E/e' medial:  9.0 LV SV:         96 LV SV Index:   47 LVOT Area:     3.14 cm   LV Volumes (  MOD) LV vol d, MOD A2C: 112.0 ml LV vol d, MOD A4C: 165.0 ml LV vol s, MOD A2C: 49.1 ml LV vol s, MOD A4C: 71.7 ml LV SV MOD A2C:     62.9 ml LV SV MOD A4C:     165.0 ml LV SV MOD BP:      83.4 ml  RIGHT VENTRICLE RV S prime:     14.30 cm/s TAPSE (M-mode): 2.5 cm  LEFT ATRIUM             Index       RIGHT ATRIUM           Index LA diam:        4.80 cm 2.34 cm/m  RA Area:     15.30 cm LA Vol (A2C):   51.1 ml 24.93 ml/m RA Volume:   43.10 ml  21.03 ml/m LA Vol (A4C):   30.0 ml 14.64 ml/m LA Biplane Vol: 41.1 ml 20.05 ml/m  AORTIC VALVE AV Area (Vmax):    1.68 cm AV Area (Vmean):   1.67 cm AV Area (VTI):     1.66 cm AV Vmax:           280.00 cm/s AV Vmean:          183.000 cm/s AV VTI:            0.578 m AV Peak Grad:      31.4 mmHg AV Mean Grad:      16.0 mmHg LVOT Vmax:         150.00 cm/s LVOT Vmean:        97.100 cm/s LVOT VTI:          0.306 m LVOT/AV VTI ratio: 0.53 AI PHT:            524 msec   AORTA Ao Root diam: 3.10 cm  MITRAL VALVE MV Area (PHT): 3.03 cm    SHUNTS MV Decel Time: 250 msec    Systemic VTI:  0.31 m MV E velocity: 70.70 cm/s  Systemic Diam: 2.00 cm MV A  velocity: 53.60 cm/s MV E/A ratio:  1.32  Jenne Campus MD Electronically signed by Jenne Campus MD Signature Date/Time: 10/15/2019/5:21:24 PM      Final   Note: Reviewed        Physical Exam  General appearance: Well nourished, well developed, and well hydrated. In no apparent acute distress Mental status: Alert, oriented x 3 (person, place, & time)       Respiratory: No evidence of acute respiratory distress Eyes: PERLA Vitals: BP (!) 147/51   Pulse 71   Temp 98.1 F (36.7 C) (Temporal)   Resp 18   Ht 5' 8"  (1.727 m)   Wt 203 lb (92.1 kg)   SpO2 98%   BMI 30.87 kg/m  BMI: Estimated body mass index is 30.87 kg/m as calculated from the following:   Height as of this encounter: 5' 8"  (1.727 m).   Weight as of this encounter: 203 lb (92.1 kg). Ideal: Ideal body weight: 68.4 kg (150 lb 12.7 oz) Adjusted ideal body weight: 77.9 kg (171 lb 10.8 oz)  Cervical Spine Area Exam  Skin & Axial Inspection: No masses, redness, edema, swelling, or associated skin lesions Alignment: Symmetrical Functional ROM: Decreased ROM, bilaterally Stability: No instability detected Muscle Tone/Strength: Functionally intact. No obvious neuro-muscular anomalies detected. Sensory (Neurological): Musculoskeletal pain pattern Palpation: No palpable anomalies             Upper Extremity (UE)  Exam    Side: Right upper extremity  Side: Left upper extremity  Skin & Extremity Inspection: Skin color, temperature, and hair growth are WNL. No peripheral edema or cyanosis. No masses, redness, swelling, asymmetry, or associated skin lesions. No contractures.  Skin & Extremity Inspection: Skin color, temperature, and hair growth are WNL. No peripheral edema or cyanosis. No masses, redness, swelling, asymmetry, or associated skin lesions. No contractures.  Functional ROM: Pain restricted ROM for shoulder and elbow  Functional ROM: Pain restricted ROM for shoulder and elbow  Muscle Tone/Strength: Functionally  intact. No obvious neuro-muscular anomalies detected.  Muscle Tone/Strength: Functionally intact. No obvious neuro-muscular anomalies detected.  Sensory (Neurological): Musculoskeletal pain pattern          Sensory (Neurological): Musculoskeletal pain pattern          Palpation: No palpable anomalies              Palpation: No palpable anomalies              Provocative Test(s):  Phalen's test: deferred Tinel's test: deferred Apley's scratch test (touch opposite shoulder):  Action 1 (Across chest): deferred Action 2 (Overhead): deferred Action 3 (LB reach): deferred   Provocative Test(s):  Phalen's test: deferred Tinel's test: deferred Apley's scratch test (touch opposite shoulder):  Action 1 (Across chest): deferred Action 2 (Overhead): deferred Action 3 (LB reach): deferred    Lumbar Spine Area Exam  Skin & Axial Inspection: Well healed scar from previous spine surgery detected Alignment: Symmetrical Functional ROM: Pain restricted ROM       Stability: No instability detected Muscle Tone/Strength: Functionally intact. No obvious neuro-muscular anomalies detected. Sensory (Neurological): Musculoskeletal pain pattern  Gait & Posture Assessment  Ambulation: Patient ambulates using a cane Gait: Antalgic Posture: Difficulty standing up straight, due to pain    Assessment   Status Diagnosis  Controlled Controlled Controlled 1. Small fiber neuropathy   2. Cervical radicular pain   3. Degenerative disc disease, cervical   4. Dementia without behavioral disturbance, unspecified dementia type ([place])   5. Whiplash injury syndrome, sequela   6. Reactive depression   7. Chronic pain due to trauma   8. Mood change      Updated Problems: Problem  Chronic Pain Due to Trauma  Cervical Radicular Pain  Cervical Spondylosis  Small Fiber Neuropathy  Ddd (Degenerative Disc Disease), Cervical  Whiplash Injury Syndrome, Sequela  Peripheral Neuropathy Due to Chemotherapy ([facility])     Plan of Care   Mr. Reynald Woods Forness [name] has a current medication list which includes the following long-term medication(s): bupropion, famotidine, furosemide, metoprolol succinate, pantoprazole, and quetiapine.  Pharmacotherapy (Medications Ordered): Meds ordered this encounter  Medications  . HYDROcodone-acetaminophen (NORCO) 10-325 MG tablet    Sig: Take 1 tablet by mouth every 6 (six) hours as needed. For chronic pain syndrome. To last 30 days from fill date. Ok to fill 1 day early if pharmacy closed on fill date    Dispense:  120 tablet    Refill:  0  . HYDROcodone-acetaminophen (NORCO) 10-325 MG tablet    Sig: Take 1 tablet by mouth every 6 (six) hours as needed. For chronic pain syndrome. To last 30 days from fill date. Ok to fill 1 day early if pharmacy closed on fill date    Dispense:  120 tablet    Refill:  0   Follow-up plan:   Return in about 8 weeks (around 03/15/2020) for Medication Management, in person.     Failed  oxycodone, hydrocodone, tramadol, buprenorphine, [place].     Recent Visits Date Type Provider Dept  11/05/19 Telemedicine Gillis Santa, MD [facility]  Showing recent visits within past 90 days and meeting all other requirements Today's Visits Date Type Provider Dept  01/19/20 Office Visit Gillis Santa, MD [facility]  Showing today's visits and meeting all other requirements Future Appointments Date Type Provider Dept  03/15/20 Appointment Gillis Santa, MD [facility]  Showing future appointments within next 90 days and meeting all other requirements  I discussed the assessment and treatment plan with the patient. The patient was provided an opportunity to ask questions and all were answered. The patient agreed with the plan and demonstrated an understanding of the instructions.  Patient advised to call back or seek an in-person evaluation if the symptoms or condition worsens.  Duration of encounter: 35  minutes.  Note by: Gillis Santa, MD Date: 01/19/2020; Time: 3:43 PM

## 2020-01-19 NOTE — Patient Instructions (Signed)
Hydrocodone/APAP to last until 03/23/2020 has been escribed to your pharmacy.

## 2020-01-25 ENCOUNTER — Other Ambulatory Visit: Payer: Self-pay | Admitting: Gastroenterology

## 2020-01-25 ENCOUNTER — Telehealth: Payer: Self-pay | Admitting: Gastroenterology

## 2020-01-25 DIAGNOSIS — R1310 Dysphagia, unspecified: Secondary | ICD-10-CM

## 2020-01-25 NOTE — Telephone Encounter (Signed)
Spoke to patient's wife to let her know that orders have been faxed to [facility] for Barium Swallow study. They are not able to schedule out past September 10th and patient needs the test done  a week before his office visit in October. The order wa mailed to patient who will take it to [facility] the week of October 1st to schedule. They will contact our office if there are any problems. Wife voiced understanding.

## 2020-01-25 NOTE — Telephone Encounter (Signed)
Pt's spouse is requesting a call back to discuss orders for a swallow test for this pt.

## 2020-03-11 DIAGNOSIS — F411 Generalized anxiety disorder: Secondary | ICD-10-CM | POA: Diagnosis not present

## 2020-03-11 DIAGNOSIS — F314 Bipolar disorder, current episode depressed, severe, without psychotic features: Secondary | ICD-10-CM | POA: Diagnosis not present

## 2020-03-11 DIAGNOSIS — G4700 Insomnia, unspecified: Secondary | ICD-10-CM | POA: Diagnosis not present

## 2020-03-11 NOTE — Telephone Encounter (Signed)
Rehab department at [facility] called in reference to order for modified Barium Swallow. Because of pt's insurance they need a copy of order, demographics and notes pertaining to order. Fax: [phone].

## 2020-03-11 NOTE — Telephone Encounter (Signed)
Faxed required forms to [facility] 03/11/20 12:30

## 2020-03-14 ENCOUNTER — Telehealth: Payer: Self-pay | Admitting: Gastroenterology

## 2020-03-14 NOTE — Telephone Encounter (Signed)
Faxed signed orders.

## 2020-03-14 NOTE — Telephone Encounter (Signed)
[facility] called to advise the order placed for swallowing test is not signed. Please have provider sign off patient has appointment Thursday

## 2020-03-15 ENCOUNTER — Other Ambulatory Visit: Payer: Self-pay

## 2020-03-15 ENCOUNTER — Encounter: Payer: Self-pay | Admitting: Student in an Organized Health Care Education/Training Program

## 2020-03-15 ENCOUNTER — Ambulatory Visit
Payer: Medicare Other | Attending: Student in an Organized Health Care Education/Training Program | Admitting: Student in an Organized Health Care Education/Training Program

## 2020-03-15 ENCOUNTER — Telehealth: Payer: Self-pay | Admitting: *Deleted

## 2020-03-15 DIAGNOSIS — M5412 Radiculopathy, cervical region: Secondary | ICD-10-CM | POA: Diagnosis not present

## 2020-03-15 DIAGNOSIS — F0390 Unspecified dementia without behavioral disturbance: Secondary | ICD-10-CM

## 2020-03-15 DIAGNOSIS — M5030 Other cervical disc degeneration, unspecified cervical region: Secondary | ICD-10-CM

## 2020-03-15 DIAGNOSIS — S134XXS Sprain of ligaments of cervical spine, sequela: Secondary | ICD-10-CM

## 2020-03-15 DIAGNOSIS — G629 Polyneuropathy, unspecified: Secondary | ICD-10-CM

## 2020-03-15 NOTE — Progress Notes (Signed)
Patient: [name]  Service Category: E/M  Provider: Gillis Santa, MD  DOB: [birth year]  DOS: 03/15/2020  Location: Office  MRN: [id number]  Setting: Ambulatory outpatient  Referring Provider: [name], MD  Type: Established Patient  Specialty: Interventional Pain Management  PCP: [name], MD  Location: Home  Delivery: TeleHealth     Virtual Encounter - Pain Management PROVIDER NOTE: Information contained herein reflects review and annotations entered in association with encounter. Interpretation of such information and data should be left to medically-trained personnel. Information provided to patient can be located elsewhere in the medical record under "Patient Instructions". Document created using STT-dictation technology, any transcriptional errors that may result from process are unintentional.    Contact & Pharmacy Preferred: [phone] Home: [phone] (home) Mobile: [phone] (mobile) E-mail: No e-mail address on record  [place], [place] - [place] [address] [place] [address] Phone: [phone] Fax: [phone]   Pre-screening  Mr. Sieh offered "in-person" vs "virtual" encounter. He indicated preferring virtual for this encounter.   Reason COVID-19*  Social distancing based on CDC and AMA recommendations.   I contacted [name] on 03/15/2020 via telephone.      I clearly identified myself as Gillis Santa, MD. I verified that I was speaking with the correct person using two identifiers (Name: [name], and date of birth: 1941/04/13).  Consent I sought verbal advanced consent from [name] for virtual visit interactions. I informed [name] of possible security and privacy concerns, risks, and limitations associated with providing "not-in-person" medical evaluation and management services. I also informed [name] of the availability of "in-person" appointments.  Finally, I informed him that there would be a charge for the virtual visit and that he could be  personally, fully or partially, financially responsible for it. [name] expressed understanding and agreed to proceed.   Historic Elements   [name] is a 79 y.o. year old, male patient evaluated today after our last contact on 01/19/2020. 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 He also  has a past surgical history that includes Tonsillectomy; Inguinal hernia repair; Eye surgery; Cardiac electrophysiology study and ablation; Prostate biopsy; SEBACEOUS CYST REMOVAL; Esophagogastroduodenoscopy (06/15/2010); and Colonoscopy (04/07/2007). [name] has a current medication list which includes the following prescription(s): amlodipine, bupropion, clonidine, docusate sodium, famotidine, furosemide, hydrocodone-acetaminophen, ibuprofen, levothyroxine, metoprolol succinate, milk thistle, oxybutynin, pantoprazole, polyethylene glycol, quetiapine, and trazodone. He  reports that he quit smoking about 10 years ago. He has quit using smokeless tobacco. He reports previous alcohol use. He reports that he does not use drugs. [name] is allergic to trileptal [oxcarbazepine].   HPI  Today, he is being contacted for follow-up evaluation  Spoke with patient and his wife regarding his chronic pain.  He continues hydrocodone as prescribed.  He is noticing that he is getting more anxious due to his increased pain.  He is on lorazepam for this.  He does have dementia and cognitive dysfunction at baseline.  I counseled him on the risk of concomitant benzodiazepine and opioid use and its potential risks.  Patient still has a  prescription of hydrocodone that he can pick up from his pharmacy.  He will  follow up in 1 month for face-to-face evaluation.    UDS:  Summary  Date Value Ref Range Status  07/21/2019 Note  Final    Comment:    ==================================================================== Compliance Drug Analysis, Ur ==================================================================== Test                             Result       Flag       Units Drug Present and Declared for Prescription Verification   Tramadol                       1938         EXPECTED   ng/mg creat   O-Desmethyltramadol            >5435        EXPECTED   ng/mg creat   N-Desmethyltramadol            775          EXPECTED   ng/mg creat    Source of tramadol is a prescription medication. O-desmethyltramadol    and N-desmethyltramadol are expected metabolites of tramadol.   Gabapentin                     PRESENT      EXPECTED Drug Present not Declared for Prescription Verification   Acetaminophen                  PRESENT      UNEXPECTED Drug Absent but Declared for Prescription Verification   Trazodone                      Not Detected UNEXPECTED   Lidocaine                      Not Detected UNEXPECTED    Lidocaine, as indicated in the declared medication list, is not    always detected even when used as directed.   Metoprolol                     Not Detected UNEXPECTED ==================================================================== Test                      Result    Flag   Units      Ref Range   Creatinine              92               mg/dL      >=20 ==================================================================== Declared Medications:  The flagging and interpretation on this report are based on the  following declared medications.  Unexpected results may arise from  inaccuracies in the declared medications.  **Note: The testing scope of this panel includes these medications:  Gabapentin  Metoprolol   Tramadol  Trazodone  **Note: The testing scope of this panel does not include small to  moderate amounts of these reported medications:  Lidocaine  **Note: The testing scope of this panel does not include the  following reported medications:  Amlodipine  Furosemide  Levothyroxine  Pantoprazole  Simvastatin ==================================================================== For clinical consultation, please call [phone]. ====================================================================     Laboratory Chemistry Profile   Renal Lab Results  Component Value Date   BUN 19 12/16/2018   CREATININE 1.45 (H) 12/16/2018   BCR 13 12/16/2018   GFRAA 54 (L)  12/16/2018   GFRNONAA 46 (L) 12/16/2018     Hepatic Lab Results  Component Value Date   AST 31 12/16/2018   ALT 26 12/16/2018   ALBUMIN 4.6 12/16/2018   ALKPHOS 81 12/16/2018     Electrolytes Lab Results  Component Value Date   NA 141 12/16/2018   K 4.6 12/16/2018   CL 104 12/16/2018   CALCIUM 9.4 12/16/2018     Bone Lab Results  Component Value Date   VD25OH 35.5 12/16/2018     Inflammation (CRP: Acute Phase) (ESR: Chronic Phase) Lab Results  Component Value Date   CRP 4 12/16/2018   ESRSEDRATE 3 12/16/2018       Note: Above Lab results reviewed.  Imaging  ECHOCARDIOGRAM COMPLETE    ECHOCARDIOGRAM REPORT       Patient Name:   Rynell Ciotti [name] Date of Exam: 10/15/2019 Medical Rec #:  [id number]              Height:       68.0 in Accession #:    [id number]             Weight:       201.4 lb Date of Birth:  03/01/1941               BSA:          2.050 m Patient Age:    27 years               BP:           114/62 mmHg Patient Gender: M                      HR:           72 bpm. Exam Location:  [facility]  Procedure: 2D Echo, Cardiac Doppler and Color Doppler  Indications:    Murmur 785.2 / R01.1   History:        Patient has no prior history of Echocardiogram examinations.                  Previous Myocardial Infarction, Arrythmias:RBBB and Atrial                 Fibrillation, Signs/Symptoms:Chest Pain and Murmur; Risk                 Factors:Hypertension and Former Smoker.   Sonographer:    Vickie Epley RDCS Referring Phys: [facility]  IMPRESSIONS   1. Left ventricular ejection fraction, by estimation, is 60 to 65%. The left ventricle has normal function. The left ventricle has no regional wall motion abnormalities. Left ventricular diastolic parameters were normal.  2. Right ventricular systolic function is normal. The right ventricular size is normal.  3. The mitral valve is normal in structure. Mild mitral valve regurgitation. No evidence of mitral stenosis.  4. The aortic valve is normal in structure. Aortic valve regurgitation is trivial. Mild aortic valve stenosis. Aortic valve area, by VTI measures 1.66 cm. Aortic valve mean gradient measures 16.0 mmHg.  5. The inferior vena cava is normal in size with greater than 50% respiratory variability, suggesting right atrial pressure of 3 mmHg.  FINDINGS  Left Ventricle: Left ventricular ejection fraction, by estimation, is 60 to 65%. The left ventricle has normal function. The left ventricle has no regional wall motion abnormalities. The left ventricular internal cavity size was normal in size. There is  no left ventricular hypertrophy. Left ventricular  diastolic parameters were normal.  Right Ventricle: The right ventricular size is normal. No increase in right ventricular wall thickness. Right ventricular systolic function is normal.  Left Atrium: Left atrial size was normal in size.  Right Atrium: Right atrial size was normal in size.  Pericardium: There is no evidence of pericardial effusion.  Mitral Valve: The mitral valve is normal in structure. Normal mobility of the mitral valve leaflets. Mild mitral valve regurgitation. No evidence of mitral valve stenosis.  Tricuspid Valve: The tricuspid valve is  normal in structure. Tricuspid valve regurgitation is not demonstrated. No evidence of tricuspid stenosis.  Aortic Valve: The aortic valve is normal in structure.. There is mild thickening and mild calcification of the aortic valve. Aortic valve regurgitation is trivial. Aortic regurgitation PHT measures 524 msec. Mild aortic stenosis is present. There is mild  thickening of the aortic valve. There is mild calcification of the aortic valve. Aortic valve mean gradient measures 16.0 mmHg. Aortic valve peak gradient measures 31.4 mmHg. Aortic valve area, by VTI measures 1.66 cm.  Pulmonic Valve: The pulmonic valve was normal in structure. Pulmonic valve regurgitation is not visualized. No evidence of pulmonic stenosis.  Aorta: The aortic root is normal in size and structure.  Venous: The inferior vena cava is normal in size with greater than 50% respiratory variability, suggesting right atrial pressure of 3 mmHg.  IAS/Shunts: No atrial level shunt detected by color flow Doppler.    LEFT VENTRICLE PLAX 2D LVIDd:         5.16 cm      Diastology LVIDs:         3.46 cm      LV e' lateral:   9.90 cm/s LV PW:         0.80 cm      LV E/e' lateral: 7.1 LV IVS:        0.80 cm      LV e' medial:    7.83 cm/s LVOT diam:     2.00 cm      LV E/e' medial:  9.0 LV SV:         96 LV SV Index:   47 LVOT Area:     3.14 cm   LV Volumes (MOD) LV vol d, MOD A2C: 112.0 ml LV vol d, MOD A4C: 165.0 ml LV vol s, MOD A2C: 49.1 ml LV vol s, MOD A4C: 71.7 ml LV SV MOD A2C:     62.9 ml LV SV MOD A4C:     165.0 ml LV SV MOD BP:      83.4 ml  RIGHT VENTRICLE RV S prime:     14.30 cm/s TAPSE (M-mode): 2.5 cm  LEFT ATRIUM             Index       RIGHT ATRIUM           Index LA diam:        4.80 cm 2.34 cm/m  RA Area:     15.30 cm LA Vol (A2C):   51.1 ml 24.93 ml/m RA Volume:   43.10 ml  21.03 ml/m LA Vol (A4C):   30.0 ml 14.64 ml/m LA Biplane Vol: 41.1 ml 20.05 ml/m  AORTIC VALVE AV Area (Vmax):     1.68 cm AV Area (Vmean):   1.67 cm AV Area (VTI):     1.66 cm AV Vmax:           280.00 cm/s AV Vmean:  183.000 cm/s AV VTI:            0.578 m AV Peak Grad:      31.4 mmHg AV Mean Grad:      16.0 mmHg LVOT Vmax:         150.00 cm/s LVOT Vmean:        97.100 cm/s LVOT VTI:          0.306 m LVOT/AV VTI ratio: 0.53 AI PHT:            524 msec   AORTA Ao Root diam: 3.10 cm  MITRAL VALVE MV Area (PHT): 3.03 cm    SHUNTS MV Decel Time: 250 msec    Systemic VTI:  0.31 m MV E velocity: 70.70 cm/s  Systemic Diam: 2.00 cm MV A velocity: 53.60 cm/s MV E/A ratio:  1.32  Jenne Campus MD Electronically signed by Jenne Campus MD Signature Date/Time: 10/15/2019/5:21:24 PM      Final    Assessment  The primary encounter diagnosis was Small fiber neuropathy. Diagnoses of Cervical radicular pain, Degenerative disc disease, cervical, Dementia without behavioral disturbance, unspecified dementia type ([place]), and Whiplash injury syndrome, sequela were also pertinent to this visit.  Plan of Care  Mr. Jahbari Repinski Malstrom [name] has a current medication list which includes the following long-term medication(s): bupropion, famotidine, furosemide, metoprolol succinate, pantoprazole, and quetiapine.  Continue hydrocodone as prescribed.  Encourage basic stretching exercises to maintain range of motion and cervical paraspinal muscle strength.  Follow-up in 4 weeks for medication management.  Follow-up plan:   Return in about 5 weeks (around 04/19/2020) for Medication Management, in person.     Failed oxycodone, hydrocodone, tramadol, buprenorphine, [place].      Recent Visits Date Type Provider Dept  01/19/20 Office Visit Gillis Santa, MD [facility]  Showing recent visits within past 90 days and meeting all other requirements Today's Visits Date Type Provider Dept  03/15/20 Telemedicine Gillis Santa, MD [facility]  Showing today's visits and meeting all other  requirements Future Appointments No visits were found meeting these conditions. Showing future appointments within next 90 days and meeting all other requirements  I discussed the assessment and treatment plan with the patient. The patient was provided an opportunity to ask questions and all were answered. The patient agreed with the plan and demonstrated an understanding of the instructions.  Patient advised to call back or seek an in-person evaluation if the symptoms or condition worsens.  Duration of encounter: 35mnutes.  Note by: BGillis Santa MD Date: 03/15/2020; Time: 3:14 PM

## 2020-03-21 ENCOUNTER — Telehealth: Payer: Self-pay | Admitting: Gastroenterology

## 2020-03-21 NOTE — Telephone Encounter (Signed)
[facility] called stating that there equipment for the Barium Swallow was down and the test needed to be rescheduled.Appointment for follow up has been cancelled with Dr Lyndel Safe. The patient states that he is scheduled for the Barium swallow this Friday. He will contact the office if  he has it done. We will reschedule his follow up appointment then. Patient voiced understanding.

## 2020-03-21 NOTE — Telephone Encounter (Signed)
Pt's spouse is requesting a call back from a nurse, pt is scheduled to see Dr Lyndel Safe tomorrow but hasn't received the barium swallow test due to some complications with the machine, spouse is wanting to know if the pt should still keep his appt tomorrow with Dr Lyndel Safe.

## 2020-03-22 ENCOUNTER — Ambulatory Visit: Payer: Medicare Other | Admitting: Gastroenterology

## 2020-03-30 DIAGNOSIS — I10 Essential (primary) hypertension: Secondary | ICD-10-CM | POA: Diagnosis not present

## 2020-03-30 DIAGNOSIS — G629 Polyneuropathy, unspecified: Secondary | ICD-10-CM | POA: Diagnosis not present

## 2020-03-30 DIAGNOSIS — E7800 Pure hypercholesterolemia, unspecified: Secondary | ICD-10-CM | POA: Diagnosis not present

## 2020-03-30 DIAGNOSIS — Z87891 Personal history of nicotine dependence: Secondary | ICD-10-CM | POA: Diagnosis not present

## 2020-03-30 DIAGNOSIS — R1310 Dysphagia, unspecified: Secondary | ICD-10-CM | POA: Diagnosis not present

## 2020-03-30 DIAGNOSIS — Z79899 Other long term (current) drug therapy: Secondary | ICD-10-CM | POA: Diagnosis not present

## 2020-03-30 DIAGNOSIS — E039 Hypothyroidism, unspecified: Secondary | ICD-10-CM | POA: Diagnosis not present

## 2020-04-05 ENCOUNTER — Ambulatory Visit: Payer: Medicare Other | Admitting: Cardiology

## 2020-04-07 ENCOUNTER — Other Ambulatory Visit: Payer: Self-pay

## 2020-04-11 ENCOUNTER — Other Ambulatory Visit: Payer: Self-pay

## 2020-04-11 ENCOUNTER — Encounter: Payer: Self-pay | Admitting: Cardiology

## 2020-04-11 ENCOUNTER — Ambulatory Visit (INDEPENDENT_AMBULATORY_CARE_PROVIDER_SITE_OTHER): Payer: Medicare Other | Admitting: Cardiology

## 2020-04-11 VITALS — BP 132/70 | HR 70 | Ht 68.0 in | Wt 203.6 lb

## 2020-04-11 DIAGNOSIS — I10 Essential (primary) hypertension: Secondary | ICD-10-CM

## 2020-04-11 DIAGNOSIS — I350 Nonrheumatic aortic (valve) stenosis: Secondary | ICD-10-CM

## 2020-04-11 NOTE — Progress Notes (Signed)
Cardiology Office Note:    Date:  04/11/2020   ID:  [name], DOB 1940/07/15, MRN [id number]  PCP:  [name], MD  Cardiologist:  Jenean Lindau, MD   Referring MD: [name], MD    ASSESSMENT:    1. Essential hypertension   2. Mild aortic stenosis    PLAN:    In order of problems listed above:  1. Primary prevention stressed with the patient.  Importance of compliance with diet medication stressed any vocalized understanding. 2. Essential hypertension: Blood pressure stable and lifestyle modification was urged.  He was advised to walk at least half an hour a day 5 days a week.  Salt intake issues were discussed 3. Mild aortic stenosis: Asymptomatic at this time.  Patient has no issues with syncope shortness of breath or chest pain.  Medical management and we will continue to monitor. 4. History of ablation for atrial fibrillation in the past according to the notes in the chart and the history provided by the patient.  Patient has never been on anticoagulation.  There is no known atrial fibrillation clinically or otherwise in the past several years so I am going to stay away from the issue of anticoagulation.  At best his CHADS2 score is 2.  In view of this I told the patient to buy Chad app and machine from [employer] and keep a track of his rhythm at random times and get it was in the next appointment or earlier if there are any concerns. 5. Patient will be seen in follow-up appointment in 6 months or earlier if the patient has any concerns    Medication Adjustments/Labs and Tests Ordered: Current medicines are reviewed at length with the patient today.  Concerns regarding medicines are outlined above.  No orders of the defined types were placed in this encounter.  No orders of the defined types were placed in this encounter.    No chief complaint on file.    History of Present Illness:    [name] is a 79 y.o. male.  Patient has past  medical history of essential hypertension and mild aortic stenosis.  There is mention of atrial fibrillation ablation in the remote past though I do not have those records.  He denies any problems at this time and takes care of activities of daily living.  No chest pain orthopnea or PND.  At the time of my evaluation, the patient is alert awake oriented and in no distress.  Past Medical History:  Diagnosis Date   Acquired hypothyroidism 08/17/2014   Acute MI, true posterior wall, subsequent episode of care ([place]) 08/15/2014   EF 44% with mildly reduced LV function  Formatting of this note might be different from the original. EF 44% with mildly reduced LV function   AKI (acute kidney injury) ([place]) 09/20/2014   Anxiety    Atrial fibrillation ([place]) 09/28/2019   ablation in past  Formatting of this note might be different from the original. ablation in past   Bipolar disorder [facility])    w/dementia/psychotic episodes   Bradycardia 09/17/2014   Cancer ([place]) 09/28/2019   Prostate Cancer; Throat Cancer  Formatting of this note might be different from the original. Prostate Cancer; Throat Cancer   Cardiac murmur 09/28/2019   Cataract    Cervical radicular pain 07/16/2019   Cervical spondylosis 06/19/2019   Chest pain with high risk for cardiac etiology 08/19/2014   Chronic depression    Chronic pain  due to trauma 07/16/2019   Corn of toe 12/11/2018   DDD (degenerative disc disease), cervical 09/29/2018   Degenerative lumbar disc    Dementia without behavioral disturbance ([place]) 07/16/2019   Disease characterized by destruction of skeletal muscle 08/15/2014   Essential hypertension 09/28/2019   Essential tremor 09/21/2014   History of MI (myocardial infarction) 09/14/2014   Hypercholesteremia    Hypertension    Hypotension 09/14/2014   Malignant neoplasm of prostate ([place])    Mild aortic stenosis 11/26/2019   Mood change 10/12/2019   Myocardial infarction ([place]) 08/10/2014   Nausea and vomiting  08/19/2014   Onychomycosis of left great toe 08/21/2014   Pain of fifth toe 12/11/2018   Peptic esophagitis    Peripheral neuropathy    Peripheral neuropathy due to chemotherapy ([place]) 09/21/2014   Prolonged Q-T interval on ECG 08/19/2014   Prostate cancer ([place]) 09/28/2019   Reactive depression 10/12/2019   Renal insufficiency 08/17/2014   Right bundle branch block 09/28/2019   Small fiber neuropathy 12/16/2018   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 Pancolonic diverticulosis predominantly in the sigmoid colon. Internal hemorrhoids.    ESOPHAGOGASTRODUODENOSCOPY  06/15/2010   Erosive esophagitis. Status post PEG placment   EYE SURGERY     INGUINAL HERNIA REPAIR     PROSTATE BIOPSY     SEBACEOUS CYST REMOVAL     TONSILLECTOMY      Current Medications: Current Meds  Medication Sig   amLODipine (NORVASC) 10 MG tablet Take 10 mg by mouth daily.   cloNIDine (CATAPRES) 0.1 MG tablet Take 0.1 mg by mouth daily.   Docusate Sodium (STOOL SOFTENER LAXATIVE PO) Take by mouth as needed.    famotidine (PEPCID) 40 MG tablet Take 40 mg by mouth daily.   furosemide (LASIX) 20 MG tablet Take 20 mg by mouth daily as needed.   ibuprofen (ADVIL) 200 MG tablet Take 200 mg by mouth every 6 (six) hours as needed.   levothyroxine (SYNTHROID) 125 MCG tablet Take 125 mcg by mouth daily.   LORazepam (ATIVAN) 0.5 MG tablet Take 0.5 mg by mouth daily.   metoprolol succinate (TOPROL-XL) 25 MG 24 hr tablet Take 25 mg by mouth daily.   MILK THISTLE PO Take by mouth as needed.    oxybutynin (DITROPAN-XL) 10 MG 24 hr tablet Take 10 mg by mouth daily.    pantoprazole (PROTONIX) 40 MG tablet Take 40 mg by mouth 2 (two) times daily.   polyethylene glycol (MIRALAX / GLYCOLAX) 17 g packet Take 17 g by mouth daily as needed.   QUEtiapine (SEROQUEL) 400 MG tablet SMARTSIG:1 Tablet(s) By Mouth Every Evening   traZODone (DESYREL) 50 MG tablet Take 50-100 mg by mouth as needed. Can take 1-2      Allergies:   Trileptal [oxcarbazepine]   Social History   Socioeconomic History   Marital status: Married    Spouse name: Not on file   Number of children: 3   Years of education: 10th grade   Highest education level: Not on file  Occupational History   Occupation: Retired  Tobacco Use   Smoking status: Former Smoker    Quit  date: 2011    Years since quitting: 10.8   Smokeless tobacco: Former Counsellor Use: Never used  Substance and Sexual Activity   Alcohol use: Not Currently   Drug use: Never   Sexual activity: Not on file  Other Topics Concern   Not on file  Social History Narrative   Lives at home with wife.   Right-handed.   2 cups of caffeine per day.   Social Determinants of Health   Financial Resource Strain:    Difficulty of Paying Living Expenses: Not on file  Food Insecurity:    Worried About Charity fundraiser in the Last Year: Not on file   [employer] of Food in the Last Year: Not on file  Transportation Needs:    Lack of Transportation (Medical): Not on file   Lack of Transportation (Non-Medical): Not on file  Physical Activity:    Days of Exercise per Week: Not on file   Minutes of Exercise per Session: Not on file  Stress:    Feeling of Stress : Not on file  Social Connections:    Frequency of Communication with Friends and Family: Not on file   Frequency of Social Gatherings with Friends and Family: Not on file   Attends Religious Services: Not on file   Active Member of Clubs or Organizations: Not on file   Attends Archivist Meetings: Not on file   Marital  Status: Not on file     Family History: The patient's family history includes Heart disease in his father; Other in his mother; Prostate cancer in his father; Stomach cancer in his father. There is no history of Colon cancer, Esophageal cancer, or Rectal cancer.  ROS:   Please see the history of present illness.    All other systems reviewed and are negative.  EKGs/Labs/Other Studies Reviewed:    The following studies were reviewed today: I discussed my findings with the patient at length especially echocardiogram revealing normal EF and mild aortic stenosis.  EKG was unremarkable and revealed sinus rhythm and nonspecific ST-T changes   Recent Labs: No results found for requested labs within last 8760 hours.  Recent Lipid Panel No results found for: CHOL, TRIG, HDL, CHOLHDL, VLDL, LDLCALC, LDLDIRECT  Physical Exam:    VS:  BP 132/70    Pulse 70    Ht 5\' 8"  (1.727 m)    Wt 203 lb 9.6 oz (92.4 kg)    SpO2 94%    BMI 30.96 kg/m     Wt Readings from Last 3 Encounters:  04/11/20 203 lb 9.6 oz (92.4 kg)  01/19/20 203 lb (92.1 kg)  11/26/19 197 lb (89.4 kg)     GEN: Patient is in no acute distress HEENT: Normal NECK: No JVD; No carotid bruits LYMPHATICS: No lymphadenopathy CARDIAC: Hear sounds regular, 2/6 systolic murmur at the apex. RESPIRATORY:  Clear to auscultation without rales, wheezing or rhonchi  ABDOMEN: Soft, non-tender, non-distended MUSCULOSKELETAL:  No edema; No deformity  SKIN: Warm and dry NEUROLOGIC:  Alert and oriented x 3 PSYCHIATRIC:  Normal affect   Signed, Jenean Lindau, MD  04/11/2020 2:37 PM    [place]

## 2020-04-11 NOTE — Patient Instructions (Signed)

## 2020-04-12 ENCOUNTER — Telehealth: Payer: Self-pay | Admitting: Gastroenterology

## 2020-04-12 NOTE — Telephone Encounter (Signed)
I have called and spoke to patients wife, patients wife said she was concerned that patient would need an appointment sooner based on his barrium swallow results. I have requested these results form [facility] and will put these on Dr. Leland Her desk for review to see if patient needs an appointment sooner.

## 2020-04-13 NOTE — Telephone Encounter (Signed)
I have put this on your desk for review. Please advise what your next step is.

## 2020-04-15 ENCOUNTER — Telehealth: Payer: Self-pay | Admitting: Gastroenterology

## 2020-04-18 NOTE — Telephone Encounter (Signed)
I have called and spoke to paitents wife, I have let her know that the results are on Dr. Leland Her desk and I will call her back when he reviews them.

## 2020-04-25 NOTE — Telephone Encounter (Signed)
Please advise what next steps will be, results are on your desk.

## 2020-04-25 NOTE — Telephone Encounter (Signed)
Pt's wife is checking on the status of the pt's swallow results

## 2020-04-25 NOTE — Telephone Encounter (Signed)
MBS dated 04/02/2020. Just received and reviewed  - Enlarged Zenker's, now symptomatic (they didn't mention the exact size- looks like 2 cm from images) with episodes of deep penetration with purreed solids and liquids resulting in mod pharyngo-esophageal dysphagia. Needs to be remediated.  Plan: -Refer to Dr Wonda Amis at [facility] for possible Z-POEM/cricopharyngomyectomy or further eval. -Pl send him the reports of MBS including pictures.  RG

## 2020-04-28 NOTE — Telephone Encounter (Signed)
Referral faxed to [phone]. Spoke with a Aretta Nip since it was faxed in 11-17 and she said once it is put in the system they will give patient a call

## 2020-05-09 DIAGNOSIS — G4700 Insomnia, unspecified: Secondary | ICD-10-CM | POA: Diagnosis not present

## 2020-05-09 DIAGNOSIS — F314 Bipolar disorder, current episode depressed, severe, without psychotic features: Secondary | ICD-10-CM | POA: Diagnosis not present

## 2020-05-09 DIAGNOSIS — F411 Generalized anxiety disorder: Secondary | ICD-10-CM | POA: Diagnosis not present

## 2020-05-10 ENCOUNTER — Telehealth: Payer: Self-pay | Admitting: Gastroenterology

## 2020-05-10 DIAGNOSIS — F329 Major depressive disorder, single episode, unspecified: Secondary | ICD-10-CM | POA: Diagnosis not present

## 2020-05-10 DIAGNOSIS — K219 Gastro-esophageal reflux disease without esophagitis: Secondary | ICD-10-CM | POA: Diagnosis not present

## 2020-05-10 DIAGNOSIS — I10 Essential (primary) hypertension: Secondary | ICD-10-CM | POA: Diagnosis not present

## 2020-05-11 NOTE — Telephone Encounter (Signed)
I have called and left a message for a returned call.

## 2020-05-12 NOTE — Telephone Encounter (Signed)
[name], Can you please call Dr. Patrice Paradise and try to move his appointment up-can work him in if there is any cancellation Not much until then except keep the head of the bed elevated at night. Chew well eat small but frequent meals  RG

## 2020-05-12 NOTE — Telephone Encounter (Signed)
Would you advise patient to do anything else while he is waiting for his appointment?

## 2020-05-17 DIAGNOSIS — Z23 Encounter for immunization: Secondary | ICD-10-CM | POA: Diagnosis not present

## 2020-05-17 DIAGNOSIS — E039 Hypothyroidism, unspecified: Secondary | ICD-10-CM | POA: Diagnosis not present

## 2020-05-17 DIAGNOSIS — E7800 Pure hypercholesterolemia, unspecified: Secondary | ICD-10-CM | POA: Diagnosis not present

## 2020-05-17 DIAGNOSIS — Z79899 Other long term (current) drug therapy: Secondary | ICD-10-CM | POA: Diagnosis not present

## 2020-05-18 ENCOUNTER — Emergency Department (HOSPITAL_BASED_OUTPATIENT_CLINIC_OR_DEPARTMENT_OTHER): Payer: Medicare Other

## 2020-05-18 ENCOUNTER — Encounter: Payer: Self-pay | Admitting: Gastroenterology

## 2020-05-18 ENCOUNTER — Emergency Department (HOSPITAL_BASED_OUTPATIENT_CLINIC_OR_DEPARTMENT_OTHER)
Admission: EM | Admit: 2020-05-18 | Discharge: 2020-05-18 | Disposition: A | Discharge status: Home / Self Care | Payer: Medicare Other | Attending: Emergency Medicine | Admitting: Emergency Medicine

## 2020-05-18 ENCOUNTER — Other Ambulatory Visit: Payer: Self-pay

## 2020-05-18 ENCOUNTER — Encounter (HOSPITAL_BASED_OUTPATIENT_CLINIC_OR_DEPARTMENT_OTHER): Payer: Self-pay | Admitting: *Deleted

## 2020-05-18 ENCOUNTER — Ambulatory Visit (INDEPENDENT_AMBULATORY_CARE_PROVIDER_SITE_OTHER): Payer: Medicare Other | Admitting: Gastroenterology

## 2020-05-18 VITALS — BP 92/58 | HR 145 | Temp 99.6°F | Ht 68.0 in | Wt 204.2 lb

## 2020-05-18 DIAGNOSIS — K225 Diverticulum of esophagus, acquired: Secondary | ICD-10-CM | POA: Diagnosis not present

## 2020-05-18 DIAGNOSIS — Z79899 Other long term (current) drug therapy: Secondary | ICD-10-CM | POA: Diagnosis not present

## 2020-05-18 DIAGNOSIS — Z8546 Personal history of malignant neoplasm of prostate: Secondary | ICD-10-CM | POA: Insufficient documentation

## 2020-05-18 DIAGNOSIS — Z20822 Contact with and (suspected) exposure to covid-19: Secondary | ICD-10-CM | POA: Insufficient documentation

## 2020-05-18 DIAGNOSIS — R509 Fever, unspecified: Secondary | ICD-10-CM

## 2020-05-18 DIAGNOSIS — R1310 Dysphagia, unspecified: Secondary | ICD-10-CM | POA: Diagnosis not present

## 2020-05-18 DIAGNOSIS — E039 Hypothyroidism, unspecified: Secondary | ICD-10-CM | POA: Insufficient documentation

## 2020-05-18 DIAGNOSIS — Z87891 Personal history of nicotine dependence: Secondary | ICD-10-CM | POA: Insufficient documentation

## 2020-05-18 DIAGNOSIS — R531 Weakness: Secondary | ICD-10-CM | POA: Diagnosis not present

## 2020-05-18 DIAGNOSIS — R410 Disorientation, unspecified: Secondary | ICD-10-CM | POA: Diagnosis present

## 2020-05-18 DIAGNOSIS — R519 Headache, unspecified: Secondary | ICD-10-CM | POA: Insufficient documentation

## 2020-05-18 DIAGNOSIS — I10 Essential (primary) hypertension: Secondary | ICD-10-CM | POA: Diagnosis not present

## 2020-05-18 DIAGNOSIS — J189 Pneumonia, unspecified organism: Secondary | ICD-10-CM | POA: Diagnosis not present

## 2020-05-18 DIAGNOSIS — J690 Pneumonitis due to inhalation of food and vomit: Secondary | ICD-10-CM | POA: Insufficient documentation

## 2020-05-18 DIAGNOSIS — Z85818 Personal history of malignant neoplasm of other sites of lip, oral cavity, and pharynx: Secondary | ICD-10-CM | POA: Diagnosis not present

## 2020-05-18 DIAGNOSIS — F0390 Unspecified dementia without behavioral disturbance: Secondary | ICD-10-CM | POA: Diagnosis not present

## 2020-05-18 DIAGNOSIS — I959 Hypotension, unspecified: Secondary | ICD-10-CM | POA: Diagnosis not present

## 2020-05-18 DIAGNOSIS — A419 Sepsis, unspecified organism: Secondary | ICD-10-CM | POA: Diagnosis present

## 2020-05-18 DIAGNOSIS — J180 Bronchopneumonia, unspecified organism: Secondary | ICD-10-CM | POA: Diagnosis not present

## 2020-05-18 DIAGNOSIS — R000 Tachycardia, unspecified: Secondary | ICD-10-CM | POA: Insufficient documentation

## 2020-05-18 HISTORY — DX: Sepsis, unspecified organism: A41.9

## 2020-05-18 LAB — COMPREHENSIVE METABOLIC PANEL
ALT: 25 U/L (ref 0–44)
AST: 27 U/L (ref 15–41)
Albumin: 3.2 g/dL — ABNORMAL LOW (ref 3.5–5.0)
Alkaline Phosphatase: 66 U/L (ref 38–126)
Anion gap: 7 (ref 5–15)
BUN: 12 mg/dL (ref 8–23)
CO2: 25 mmol/L (ref 22–32)
Calcium: 8.7 mg/dL — ABNORMAL LOW (ref 8.9–10.3)
Chloride: 107 mmol/L (ref 98–111)
Creatinine, Ser: 1.4 mg/dL — ABNORMAL HIGH (ref 0.61–1.24)
GFR, Estimated: 52 mL/min — ABNORMAL LOW (ref >60)
Glucose, Bld: 104 mg/dL — ABNORMAL HIGH (ref 70–99)
Potassium: 3.5 mmol/L (ref 3.5–5.1)
Sodium: 139 mmol/L (ref 135–145)
Total Bilirubin: 0.4 mg/dL (ref 0.3–1.2)
Total Protein: 6.2 g/dL — ABNORMAL LOW (ref 6.5–8.1)

## 2020-05-18 LAB — LACTIC ACID, PLASMA
Lactic Acid, Venous: 2.2 mmol/L (ref 0.5–1.9)
Lactic Acid, Venous: 1.6 mmol/L (ref 0.5–1.9)

## 2020-05-18 LAB — CBC WITH DIFFERENTIAL/PLATELET
Abs Immature Granulocytes: 0.25 10*3/uL — ABNORMAL HIGH (ref 0.00–0.07)
Basophils Absolute: 0.1 10*3/uL (ref 0.0–0.1)
Basophils Relative: 1 %
Eosinophils Absolute: 0.1 10*3/uL (ref 0.0–0.5)
Eosinophils Relative: 1 %
HCT: 39.6 % (ref 39.0–52.0)
Hemoglobin: 12.8 g/dL — ABNORMAL LOW (ref 13.0–17.0)
Immature Granulocytes: 3 %
Lymphocytes Relative: 4 %
Lymphs Abs: 0.4 10*3/uL — ABNORMAL LOW (ref 0.7–4.0)
MCH: 28.5 pg (ref 26.0–34.0)
MCHC: 32.3 g/dL (ref 30.0–36.0)
MCV: 88.2 fL (ref 80.0–100.0)
Monocytes Absolute: 0.7 10*3/uL (ref 0.1–1.0)
Monocytes Relative: 8 %
Neutro Abs: 6.9 10*3/uL (ref 1.7–7.7)
Neutrophils Relative %: 83 %
Platelets: 201 10*3/uL (ref 150–400)
RBC: 4.49 MIL/uL (ref 4.22–5.81)
RDW: 14.7 % (ref 11.5–15.5)
WBC: 8.3 10*3/uL (ref 4.0–10.5)
nRBC: 0 % (ref 0.0–0.2)

## 2020-05-18 LAB — URINALYSIS, ROUTINE W REFLEX MICROSCOPIC
Bilirubin Urine: NEGATIVE
Glucose, UA: NEGATIVE mg/dL
Hgb urine dipstick: NEGATIVE
Ketones, ur: NEGATIVE mg/dL
Leukocytes,Ua: NEGATIVE
Nitrite: NEGATIVE
Protein, ur: NEGATIVE mg/dL
Specific Gravity, Urine: 1.01 (ref 1.005–1.030)
pH: 7.5 (ref 5.0–8.0)

## 2020-05-18 LAB — RESP PANEL BY RT-PCR (FLU A&B, COVID) ARPGX2
Influenza A by PCR: NEGATIVE
Influenza B by PCR: NEGATIVE
SARS Coronavirus 2 by RT PCR: NEGATIVE

## 2020-05-18 LAB — LIPASE, BLOOD: Lipase: 15 U/L (ref 11–51)

## 2020-05-18 LAB — APTT: aPTT: 29 seconds (ref 24–36)

## 2020-05-18 LAB — PROTIME-INR
INR: 1 (ref 0.8–1.2)
Prothrombin Time: 12.6 seconds (ref 11.4–15.2)

## 2020-05-18 LAB — BRAIN NATRIURETIC PEPTIDE: B Natriuretic Peptide: 154.6 pg/mL — ABNORMAL HIGH (ref 0.0–100.0)

## 2020-05-18 MED ORDER — LACTATED RINGERS IV SOLN: INTRAVENOUS | Status: DC

## 2020-05-18 MED ORDER — SODIUM CHLORIDE 0.9 % IV SOLN
3.0000 g | Freq: Four times a day (QID) | INTRAVENOUS | Status: DC
  Administered 2020-05-18 (×2): 3 g via INTRAVENOUS
  Filled 2020-05-18 (×2): qty 8

## 2020-05-18 MED ORDER — FUROSEMIDE 20 MG PO TABS: 20.0000 mg | ORAL_TABLET | Freq: Every day | ORAL | Status: DC

## 2020-05-18 MED ORDER — SODIUM CHLORIDE 0.9 % IV SOLN
INTRAVENOUS | Status: DC | PRN
  Administered 2020-05-18: 25 mL via INTRAVENOUS

## 2020-05-18 MED ORDER — LACTATED RINGERS IV BOLUS (SEPSIS)
2000.0000 mL | Freq: Once | INTRAVENOUS | Status: AC
  Administered 2020-05-18: 2000 mL via INTRAVENOUS

## 2020-05-18 MED ORDER — METOPROLOL SUCCINATE ER 25 MG PO TB24: 25.0000 mg | ORAL_TABLET | Freq: Every day | ORAL | Status: DC

## 2020-05-18 MED ORDER — FAMOTIDINE 20 MG PO TABS
40.0000 mg | ORAL_TABLET | Freq: Every day | ORAL | Status: DC
  Administered 2020-05-18: 40 mg via ORAL
  Filled 2020-05-18: qty 2

## 2020-05-18 MED ORDER — PANTOPRAZOLE SODIUM 40 MG PO TBEC: 40.0000 mg | DELAYED_RELEASE_TABLET | Freq: Two times a day (BID) | ORAL | Status: DC

## 2020-05-18 MED ORDER — AMLODIPINE BESYLATE 5 MG PO TABS: 10.0000 mg | ORAL_TABLET | Freq: Every day | ORAL | Status: DC

## 2020-05-18 MED ORDER — AMOXICILLIN-POT CLAVULANATE 875-125 MG PO TABS: 1.0000 | ORAL_TABLET | Freq: Two times a day (BID) | ORAL | 0 refills | Status: DC

## 2020-05-18 MED ORDER — LEVOTHYROXINE SODIUM 25 MCG PO TABS: 125.0000 ug | ORAL_TABLET | Freq: Every day | ORAL | Status: DC

## 2020-05-18 MED ORDER — QUETIAPINE FUMARATE 100 MG PO TABS: 400.0000 mg | ORAL_TABLET | Freq: Every day | ORAL | Status: DC

## 2020-05-18 MED ORDER — ACETAMINOPHEN 325 MG PO TABS
650.0000 mg | ORAL_TABLET | Freq: Once | ORAL | Status: AC
  Administered 2020-05-18: 650 mg via ORAL
  Filled 2020-05-18: qty 2

## 2020-05-18 MED ORDER — LORAZEPAM 1 MG PO TABS
0.5000 mg | ORAL_TABLET | Freq: Every day | ORAL | Status: DC | PRN
  Administered 2020-05-18: 0.5 mg via ORAL
  Filled 2020-05-18: qty 1

## 2020-05-18 MED ORDER — CLONIDINE HCL 0.1 MG PO TABS: 0.1000 mg | ORAL_TABLET | Freq: Every day | ORAL | Status: DC

## 2020-05-18 MED ORDER — DOCUSATE SODIUM 50 MG PO CAPS
50.0000 mg | ORAL_CAPSULE | Freq: Every day | ORAL | Status: DC | PRN
  Filled 2020-05-18: qty 1

## 2020-05-18 NOTE — Progress Notes (Signed)
Chief Complaint: Dysphagia  Referring Provider:  [name], MD      ASSESSMENT AND PLAN;   #1. Large Zenker's diverticulum with aspiration.  Awaiting appointment with Dr. Wonda Amis @ [facility] (Jun 17, 2020) for possible endoscopic Z-POEM/cricopharyngomyotomy. Now with fever, lethargy and hypotension- ?aspiration.  He did get booster shot for COVID-19 yesterday.  GERD with dysphagia (likely oropharyngeal). H/O XRT to the neck. EGD with dil 07/2019 50Fr, small HH  #2. 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  Did require PEG at that time. Neg recent ENT eval (Dr Gaylyn Cheers).  No further Onc FUs needed as per [facility] (Dr. Bobby Rumpf)  #3. Chronic constipation.  Likely exacerbated by medications including Ca channel blockers. Better with 2/day stool softeners.  Plan: -We did send him to ED stat for IVF/blood work/chest x-ray/further eval. May need A/Bs -We will call Dr. Towanda Malkin office and try to move his appt up especially in case of any cancellation. -Discussed in detail with the patient and patient's wife.   HPI:    [name] is a 79 y.o. male  Accompanied by his wife Known to me from [place].   Weak, fever and listlessness with hypotension BP 92/58, pulse 145/min - ?septic  Had booster shot for COVID-19 yesterday.  Underwent MBS 04/02/2020 showing enlarged Zenker's diverticulum (appox 2 cm- report not scanned in yet- in process, I am told), now symptomatic, with episodes of deep penetration with pured solids and liquids resulting in moderate pharyngoesophageal dysphagia.  He has been referred to Dr. Patrice Paradise for further evaluation.  S/P barium swallow followed by EGD with dilatation as detailed below.  Had 1 likely asp pneumonia April 2021, didn't get adm, took PO antibiotics.  None since.  Denies having any significant nausea, vomiting or odynophagia.  Has history of longstanding chronic constipation.  Has to take 1 stool softener per day.  Still would have bowel  movements at the frequency of once per week.  Hard stool without melena or hematochezia.  Does have some abdominal bloating.  Wants to hold off on colonoscopy until upper GI symptoms are better.  For his tonsillary carcinoma, he has been followed at [facility] by Dr. Bobby Rumpf.  Per patient " Dr. Bobby Rumpf has turned him loose" as his follow-up CTs/PET scan were all negative for recurrence.  Past GI procedures:  MBS dated 04/02/2020.  - Enlarged Zenker's, now symptomatic (they didn't mention the exact size- looks like 2 cm from images) with episodes of deep penetration with purreed solids and liquids resulting in mod pharyngo-esophageal dysphagia. Needs to be treated  Plan: -Refer to Dr Wonda Amis at [facility] for possible Z-POEM/cricopharyngomyectomy or further eval. -Pl send him the reports of MBS including pictures.  -Colonoscopy 03/2007: Colonic polyp SP polypectomy, pancolonic diverticulosis. Bx- TA.  Wanted to hold off on colonoscopy.  Ba Swallow 07/2019 IMPRESSION: 1. Silent aspiration with successive swallows during double-contrast portion of exam. 2. Secondary to this fact, the study was performed entirely upright and esophageal peristalsis was not evaluated. 3. Given this limitation, no evidence of esophageal stricture or anatomic cause for patient's symptoms identified. 4. Small Zenker's diverticulum. 5. Consider speech pathology evaluation.  EGD 07/2019 - Benign-appearing esophageal stenosis with surrounding esophagitis. Biopsied and dilated. - Small hiatal hernia. - Gastritis. Biopsied.  -EGD with PEG 06/2010: Erosive esophagitis, s/p 24FR PEG placement  Wt Readings from Last 3 Encounters:  05/18/20 204 lb 4 oz (92.6 kg)  04/11/20 203 lb 9.6 oz (92.4 kg)  01/19/20 203 lb (92.1 kg)    Past Medical History:  Diagnosis Date  . Acquired hypothyroidism 08/17/2014  . Acute MI, true posterior wall, subsequent episode of care ([place]) 08/15/2014   EF 44% with mildly reduced LV  function  Formatting of this note might be different from the original. EF 44% with mildly reduced LV function  . AKI (acute kidney injury) ([place]) 09/20/2014  . Anxiety   . Atrial fibrillation ([place]) 09/28/2019   ablation in past  Formatting of this note might be different from the original. ablation in past  . Bipolar disorder ([place])    w/dementia/psychotic episodes  . Bradycardia 09/17/2014  . Cancer ([place]) 09/28/2019   Prostate Cancer; Throat Cancer  Formatting of this note might be different from the original. Prostate Cancer; Throat Cancer  . Cardiac murmur 09/28/2019  . Cataract   . Cervical radicular pain 07/16/2019  . Cervical spondylosis 06/19/2019  . Chest pain with high risk for cardiac etiology 08/19/2014  . Chronic depression   . Chronic pain due to trauma 07/16/2019  . Corn of toe 12/11/2018  . DDD (degenerative disc disease), cervical 09/29/2018  . Degenerative lumbar disc   . Dementia without behavioral disturbance ([place]) 07/16/2019  . Disease characterized by destruction of skeletal muscle 08/15/2014  . Essential hypertension 09/28/2019  . Essential tremor 09/21/2014  . History of MI (myocardial infarction) 09/14/2014  . Hypercholesteremia   . Hypertension   . Hypotension 09/14/2014  . Malignant neoplasm of prostate ([place])   . Mild aortic stenosis 11/26/2019  . Mood change 10/12/2019  . Myocardial infarction ([place]) 08/10/2014  . Nausea and vomiting 08/19/2014  . Onychomycosis of left great toe 08/21/2014  . Pain of fifth toe 12/11/2018  . Peptic esophagitis   . Peripheral neuropathy   . Peripheral neuropathy due to chemotherapy ([place]) 09/21/2014  . Prolonged Q-T interval on ECG 08/19/2014  . Prostate cancer ([place]) 09/28/2019  . Reactive depression 10/12/2019  . Renal insufficiency 08/17/2014  . Right bundle branch block 09/28/2019  . Small fiber neuropathy 12/16/2018  . Squamous cell carcinoma of left tonsil ([facility])   . Throat cancer ([place])   . Thyroid disease   . Transaminitis 08/17/2014  . Unstable angina  ([place]) 09/14/2014  . Whiplash injury syndrome, sequela 08/11/2018    Past Surgical History:  Procedure Laterality Date  . CARDIAC ELECTROPHYSIOLOGY STUDY AND ABLATION    . COLONOSCOPY  04/07/2007   Small colonic polyp status post polypectomy. Pancolonic diverticulosis predominantly in the sigmoid colon. Internal hemorrhoids.   . ESOPHAGOGASTRODUODENOSCOPY  06/15/2010   Erosive esophagitis. Status post PEG placment  . EYE SURGERY    . INGUINAL HERNIA REPAIR    . PROSTATE BIOPSY    . SEBACEOUS CYST REMOVAL    . TONSILLECTOMY      Family History  Problem Relation Age of Onset  . Other Mother        brain tumor - unsure if it was cancer  . Heart disease Father   . Prostate cancer Father   . Stomach cancer Father   . Colon cancer Neg Hx   . Esophageal cancer Neg Hx   . Rectal cancer Neg Hx     Social History   Tobacco Use  . Smoking status: Former Smoker    Quit date: 2011    Years since quitting: 10.9  . Smokeless tobacco: Former Network engineer  . Vaping Use: Never used  Substance Use Topics  . Alcohol use: Not Currently  .  Drug use: Never    Current Outpatient Medications  Medication Sig Dispense Refill  . amLODipine (NORVASC) 10 MG tablet Take 10 mg by mouth daily.    . busPIRone (BUSPAR) 10 MG tablet Take 10 mg by mouth as needed.     . cloNIDine (CATAPRES) 0.1 MG tablet Take 0.1 mg by mouth daily.    Mariane Baumgarten Sodium (STOOL SOFTENER LAXATIVE PO) Take by mouth as needed.     . famotidine (PEPCID) 40 MG tablet Take 40 mg by mouth daily.    . furosemide (LASIX) 20 MG tablet Take 20 mg by mouth daily as needed.    Marland Kitchen ibuprofen (ADVIL) 200 MG tablet Take 200 mg by mouth every 6 (six) hours as needed.    Marland Kitchen levothyroxine (SYNTHROID) 125 MCG tablet Take 125 mcg by mouth daily.    Marland Kitchen LORazepam (ATIVAN) 0.5 MG tablet Take 0.5 mg by mouth daily.    . metoprolol succinate (TOPROL-XL) 25 MG 24 hr tablet Take 25 mg by mouth daily.    Marland Kitchen MILK THISTLE PO Take by mouth as needed.      Marland Kitchen oxybutynin (DITROPAN-XL) 10 MG 24 hr tablet Take 10 mg by mouth daily.    . pantoprazole (PROTONIX) 40 MG tablet Take 40 mg by mouth 2 (two) times daily.    . polyethylene glycol (MIRALAX / GLYCOLAX) 17 g packet Take 17 g by mouth daily as needed.    Marland Kitchen QUEtiapine (SEROQUEL) 400 MG tablet SMARTSIG:1 Tablet(s) By Mouth Every Evening    . traZODone (DESYREL) 50 MG tablet Take 50-100 mg by mouth as needed. Can take 1-2      No current facility-administered medications for this visit.    Allergies  Allergen Reactions  . Trileptal [Oxcarbazepine] Rash    Review of Systems:  neg     Physical Exam:    BP 94/70   Pulse (!) 145   Ht 5\' 8"  (1.727 m)   Wt 204 lb 4 oz (92.6 kg)   BMI 31.06 kg/m  Wt Readings from Last 3 Encounters:  05/18/20 204 lb 4 oz (92.6 kg)  04/11/20 203 lb 9.6 oz (92.4 kg)  01/19/20 203 lb (92.1 kg)   Psychiatric: Normal mood and affect. Behavior is normal. HEENT: Pupils normal.  Conjunctivae are normal. No scleral icterus.  Well-healed surgical scars. Neck supple.  Cardiovascular: Normal rate, regular rhythm. No edema Pulmonary/chest: Effort normal and breath sounds normal. No wheezing, rales or rhonchi. Abdominal: Soft, nondistended. Nontender. Bowel sounds active throughout. There are no masses palpable. No hepatomegaly. Rectal:  defered Neurological: Alert and oriented to person place and time. Skin: Skin is warm and dry. No rashes noted.  Data Reviewed: I have personally reviewed following labs and imaging studies  CBC: CBC Latest Ref Rng & Units 12/16/2018 09/19/2007 09/18/2007  WBC 3.4 - 10.8 x10E3/uL 6.3 - -  Hemoglobin 13.0 - 17.7 g/dL 14.6 13.9 12.7(L)  Hematocrit 37.5 - 51.0 % 41.7 40.2 38.2(L)  Platelets 150 - 450 x10E3/uL 182 - -    CMP: CMP Latest Ref Rng & Units 12/16/2018 09/15/2007  Glucose 65 - 99 mg/dL 89 113(H)  BUN 8 - 27 mg/dL 19 8  Creatinine 0.76 - 1.27 mg/dL 1.45(H) 1.09  Sodium 134 - 144 mmol/L 141 138  Potassium 3.5 - 5.2  mmol/L 4.6 3.8  Chloride 96 - 106 mmol/L 104 103  CO2 20 - 29 mmol/L 22 28  Calcium 8.6 - 10.2 mg/dL 9.4 9.1  Total Protein 6.0 - 8.5  g/dL 6.5 -  Total Bilirubin 0.0 - 1.2 mg/dL 0.3 -  Alkaline Phos 39 - 117 IU/L 81 -  AST 0 - 40 IU/L 31 -  ALT 0 - 44 IU/L 26 -      Carmell Austria, MD 05/18/2020, 8:36 AM  Cc: [name], MD

## 2020-05-18 NOTE — Progress Notes (Signed)
Elink monitoring for sepsis protocol 

## 2020-05-18 NOTE — Patient Instructions (Signed)
If you are age 77 or older, your body mass index should be between 23-30. Your Body mass index is 31.06 kg/m. If this is out of the aforementioned range listed, please consider follow up with your Primary Care Provider.  If you are age 65 or younger, your body mass index should be between 19-25. Your Body mass index is 31.06 kg/m. If this is out of the aformentioned range listed, please consider follow up with your Primary Care Provider.   Please go to the Emergency Room down stairs before you leave today.   Thank you,  Dr. Jackquline Denmark

## 2020-05-18 NOTE — Progress Notes (Signed)
Pharmacy Antibiotic Note  [name] is a 79 y.o. male admitted on 05/18/2020 with aspiration PNA.  Pharmacy has been consulted for Unasyn dosing. SCr 1.4 on presentation.  Plan: Unasyn 3g IV q6h Monitor clinical progress, c/s, renal function F/u de-escalation plan/LOT    Temp (24hrs), Avg:100.6 F (38.1 C), Min:99.6 F (37.6 C), Max:101.6 F (38.7 C)  Recent Labs  Lab 05/18/20 0930  WBC 8.3  CREATININE 1.40*  LATICACIDVEN 2.2*    Estimated Creatinine Clearance: 48.8 mL/min (A) (by C-G formula based on SCr of 1.4 mg/dL (H)).    Allergies  Allergen Reactions  . Trileptal [Oxcarbazepine] Rash    Arturo Morton, PharmD, BCPS Please check AMION for all [place] contact numbers Clinical Pharmacist 05/18/2020 10:21 AM

## 2020-05-18 NOTE — ED Provider Notes (Signed)
Care of the patient assumed at the change of shift pending admission for pneumonia. Patient is frustrated about the prolonged admission process. He wants to go home now. He understands that he could get much worse including hypoxia, respiratory failure, permament disability or death and is willing to take those risks. He will sign out AMA. Rx for Augmentin. Advised to follow up with his PCP or return for any worsening or if he changes his mind.    Truddie Hidden, MD 05/18/20 [phone]

## 2020-05-18 NOTE — ED Triage Notes (Signed)
Fever of 101.4 on Saturday and low BP.  Dr. Lyndel Safe advised him to go to the ED.

## 2020-05-18 NOTE — ED Notes (Addendum)
Pt A&Ox4, IV removed. Contacted pt family member to pick up pt from [facility]. Pt refusing full reassessment of vitals. Pt signed AMA form, able to ambulate out of ED w/o incident. Provided pt w paperwork of where to pick up abx script.

## 2020-05-18 NOTE — ED Notes (Signed)
Pt able to eat and drink per MD. Pt given tomato soup.

## 2020-05-18 NOTE — ED Provider Notes (Signed)
[facility] Provider Note   CSN: [id number] Arrival date & time: 05/18/20  0902     History CC:  Confusion, weakness  [name] is a 79 y.o. male w/ hx of large zenker's diverticulum with aspiration, dysphagia, A Fib, presenting to the ED from the GI suite upstairs with concern for possible infection or aspiration.  I spoke to Dr Lyndel Safe the GI physician by phone who reported concerns that the patient had a fever, lethargy, hypotension, and wanted him evaluated in the ED for possible aspiration of infection.  BP was 92/58 in the office, HR 145 in the office.  The patient did receive a covid booster injection yesterday.   His wife at bedside provide supplemental history.  She reports he has been feeling unwell for 3 to 4 days.  However in the past 24 hours he has had new onset of confusion, and has been "checked out."  She said this is unusual for him.   Patient self is able to tell me he feels generally unwell, fatigued, low energy.  He reports a very mild headache.  He denies cough.  He denies nausea, vomiting, abdominal pain, or diarrhea.  He has been compliant with all of his medications and took all of them as prescribed, including his morning medications.  He takes metoprolol in the evenings.  Hx of aspiration PNA in April 2021   HPI     Past Medical History:  Diagnosis Date  . Acquired hypothyroidism 08/17/2014  . Acute MI, true posterior wall, subsequent episode of care ([place]) 08/15/2014   EF 44% with mildly reduced LV function  Formatting of this note might be different from the original. EF 44% with mildly reduced LV function  . AKI (acute kidney injury) ([place]) 09/20/2014  . Anxiety   . Atrial fibrillation ([place]) 09/28/2019   ablation in past  Formatting of this note might be different from the original. ablation in past  . Bipolar disorder ([place])    w/dementia/psychotic episodes  . Bradycardia 09/17/2014  . Cancer ([place]) 09/28/2019    Prostate Cancer; Throat Cancer  Formatting of this note might be different from the original. Prostate Cancer; Throat Cancer  . Cardiac murmur 09/28/2019  . Cataract   . Cervical radicular pain 07/16/2019  . Cervical spondylosis 06/19/2019  . Chest pain with high risk for cardiac etiology 08/19/2014  . Chronic depression   . Chronic pain due to trauma 07/16/2019  . Corn of toe 12/11/2018  . DDD (degenerative disc disease), cervical 09/29/2018  . Degenerative lumbar disc   . Dementia without behavioral disturbance ([place]) 07/16/2019  . Disease characterized by destruction of skeletal muscle 08/15/2014  . Essential hypertension 09/28/2019  . Essential tremor 09/21/2014  . History of MI (myocardial infarction) 09/14/2014  . Hypercholesteremia   . Hypertension   . Hypotension 09/14/2014  . Malignant neoplasm of prostate ([place])   . Mild aortic stenosis 11/26/2019  . Mood change 10/12/2019  . Myocardial infarction ([place]) 08/10/2014  . Nausea and vomiting 08/19/2014  . Onychomycosis of left great toe 08/21/2014  . Pain of fifth toe 12/11/2018  . Peptic esophagitis   . Peripheral neuropathy   . 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 Prolonged Q-T interval on ECG 08/19/2014  . Prostate cancer ([place]) 09/28/2019  . Reactive depression 10/12/2019  . Renal insufficiency 08/17/2014  . Right bundle branch block 09/28/2019  . Small fiber neuropathy 12/16/2018  . Squamous cell carcinoma of left tonsil (HCC)   .  Throat cancer ([place])   . Thyroid disease   . Transaminitis 08/17/2014  . Unstable angina ([place]) 09/14/2014  . Whiplash injury syndrome, sequela 08/11/2018    Patient Active Problem List   Diagnosis Date Noted  . Sepsis ([place]) 05/18/2020  . Mild aortic stenosis 11/26/2019  . Reactive depression 10/12/2019  . Mood change 10/12/2019  . Atrial fibrillation ([place]) 09/28/2019  . Bipolar disorder ([place]) 09/28/2019  . Cancer ([place]) 09/28/2019  . Prostate cancer ([name]) 09/28/2019  . Right bundle branch block  09/28/2019  . Throat cancer ([place]) 09/28/2019  . Essential hypertension 09/28/2019  . Cardiac murmur 09/28/2019  . Chronic pain due to trauma 07/16/2019  . Cervical radicular pain 07/16/2019  . Dementia without behavioral disturbance ([place]) 07/16/2019  . Cervical spondylosis 06/19/2019  . Small fiber neuropathy 12/16/2018  . Corn of toe 12/11/2018  . Pain of fifth toe 12/11/2018  . DDD (degenerative disc disease), cervical 09/29/2018  . Whiplash injury syndrome, sequela 08/11/2018  . Essential tremor 09/21/2014  . Peripheral neuropathy due to chemotherapy ([place]) 09/21/2014  . AKI (acute kidney injury) ([place]) 09/20/2014  . Bradycardia 09/17/2014  . History of MI (myocardial infarction) 09/14/2014  . Hypotension 09/14/2014  . Unstable angina ([place]) 09/14/2014  . Onychomycosis of left great toe 08/21/2014  . Chest pain with high risk for cardiac etiology 08/19/2014  . Nausea and vomiting 08/19/2014  . Prolonged Q-T interval on ECG 08/19/2014  . Acquired hypothyroidism 08/17/2014  . Renal insufficiency 08/17/2014  . Transaminitis 08/17/2014  . Acute MI, true posterior wall, subsequent episode of care ([place]) 08/15/2014  . Disease characterized by destruction of skeletal muscle 08/15/2014  . Myocardial infarction ([place]) 08/10/2014    Past Surgical History:  Procedure Laterality Date  . CARDIAC ELECTROPHYSIOLOGY STUDY AND ABLATION    . COLONOSCOPY  04/07/2007   Small colonic polyp status post polypectomy. Pancolonic diverticulosis predominantly in the sigmoid colon. Internal hemorrhoids.   . ESOPHAGOGASTRODUODENOSCOPY  06/15/2010   Erosive esophagitis. Status post PEG placment  . EYE SURGERY    . INGUINAL HERNIA REPAIR    . PROSTATE BIOPSY    . SEBACEOUS CYST REMOVAL    . TONSILLECTOMY         Family History  Problem Relation Age of Onset  . Other Mother        brain tumor - unsure if it was cancer  . Heart disease Father   . Prostate cancer Father   . Stomach cancer Father    . Colon cancer Neg Hx   . Esophageal cancer Neg Hx   . Rectal cancer Neg Hx     Social History   Tobacco Use  . Smoking status: Former Smoker    Quit date: 2011    Years since quitting: 10.9  . Smokeless tobacco: Former Network engineer  . Vaping Use: Never used  Substance Use Topics  . Alcohol use: Not Currently  . Drug use: Never    Home Medications Prior to Admission medications   Medication Sig Start Date End Date Taking? Authorizing Provider  amLODipine (NORVASC) 10 MG tablet Take 10 mg by mouth daily.    [provider]  busPIRone (BUSPAR) 10 MG tablet Take 10 mg by mouth as needed.  05/09/20   [provider]  cloNIDine (CATAPRES) 0.1 MG tablet Take 0.1 mg by mouth daily.    [provider]  Docusate Sodium (STOOL SOFTENER LAXATIVE PO) Take by mouth as needed.     [provider]  famotidine (PEPCID) 40 MG tablet Take 40 mg by mouth daily.    [provider]  furosemide (LASIX) 20 MG tablet Take 20 mg by mouth daily as needed. 04/17/19   [provider]  ibuprofen (ADVIL) 200 MG tablet Take 200 mg by mouth every 6 (six) hours as needed.    [provider]  levothyroxine (SYNTHROID) 125 MCG tablet Take 125 mcg by mouth daily. 11/19/19   [provider]  LORazepam (ATIVAN) 0.5 MG tablet Take 0.5 mg by mouth daily. 03/11/20   [provider]  metoprolol succinate (TOPROL-XL) 25 MG 24 hr tablet Take 25 mg by mouth daily. 11/25/18   [provider]  MILK THISTLE PO Take by mouth as needed.     [provider]  oxybutynin (DITROPAN-XL) 10 MG 24 hr tablet Take 10 mg by mouth daily. 12/21/19   [provider]  pantoprazole (PROTONIX) 40 MG tablet Take 40 mg by mouth 2 (two) times daily.    [provider]  polyethylene glycol (MIRALAX / GLYCOLAX) 17 g packet Take 17 g by mouth daily as needed.    [provider]  QUEtiapine (SEROQUEL) 400 MG tablet SMARTSIG:1  Tablet(s) By Mouth Every Evening 03/12/20   [provider]  traZODone (DESYREL) 50 MG tablet Take 50-100 mg by mouth as needed. Can take 1-2     [provider]    Allergies    Trileptal [oxcarbazepine]  Review of Systems   Review of Systems  Constitutional: Positive for appetite change, chills, fatigue and fever.  HENT: Negative for ear pain and sore throat.   Eyes: Negative for photophobia and visual disturbance.  Respiratory: Negative for cough and shortness of breath.   Cardiovascular: Negative for chest pain and palpitations.  Gastrointestinal: Negative for abdominal pain, diarrhea, nausea and vomiting.  Genitourinary: Negative for dysuria and hematuria.  Musculoskeletal: Negative for arthralgias and myalgias.  Skin: Negative for color change and rash.  Neurological: Positive for light-headedness and headaches. Negative for syncope.  Psychiatric/Behavioral: Negative for agitation and confusion.  All other systems reviewed and are negative.   Physical Exam Updated Vital Signs BP 117/83   Pulse (!) 103   Temp (!) 101.6 F (38.7 C) (Oral) Comment: RN Elba Barman and MD Tilia Faso informed of vitals  Resp (!) 24   SpO2 94%   Physical Exam Vitals and nursing note reviewed.  Constitutional:      Appearance: He is well-developed.  HENT:     Head: Normocephalic and atraumatic.  Eyes:     Conjunctiva/sclera: Conjunctivae normal.  Cardiovascular:     Rate and Rhythm: Regular rhythm. Tachycardia present.     Pulses: Normal pulses.  Pulmonary:     Effort: Pulmonary effort is normal. No respiratory distress.     Comments: 97% on room air Rales in left and right lower lungs Abdominal:     General: There is no distension.     Palpations: Abdomen is soft.     Tenderness: There is no abdominal tenderness. There is no guarding.  Musculoskeletal:     Cervical back: Neck supple.  Skin:    General: Skin is warm and dry.  Neurological:     General: No focal deficit  present.     Mental Status: He is alert and oriented to person, place, and time.     ED Results / Procedures / Treatments   Labs (all labs ordered are listed, but only abnormal results are displayed) Labs Reviewed  LACTIC ACID, PLASMA -  Abnormal; Notable for the following components:      Result Value   Lactic Acid, Venous 2.2 (*)    All other components within normal limits  COMPREHENSIVE METABOLIC PANEL - Abnormal; Notable for the following components:   Glucose, Bld 104 (*)    Creatinine, Ser 1.40 (*)    Calcium 8.7 (*)    Total Protein 6.2 (*)    Albumin 3.2 (*)    GFR, Estimated 52 (*)    All other components within normal limits  CBC WITH DIFFERENTIAL/PLATELET - Abnormal; Notable for the following components:   Hemoglobin 12.8 (*)    Lymphs Abs 0.4 (*)    Abs Immature Granulocytes 0.25 (*)    All other components within normal limits  BRAIN NATRIURETIC PEPTIDE - Abnormal; Notable for the following components:   B Natriuretic Peptide 154.6 (*)    All other components within normal limits  RESP PANEL BY RT-PCR (FLU A&B, COVID) ARPGX2  CULTURE, BLOOD (SINGLE)  URINE CULTURE  CULTURE, BLOOD (SINGLE)  LACTIC ACID, PLASMA  PROTIME-INR  APTT  URINALYSIS, ROUTINE W REFLEX MICROSCOPIC  LIPASE, BLOOD    EKG EKG Interpretation  Date/Time:  Wednesday May 18 2020 09:17:29 EST Ventricular Rate:  139 PR Interval:    QRS Duration: 131 QT Interval:  347 QTC Calculation: 528 R Axis:   -79 Text Interpretation: Sinus tachycardia RBBB and LAFB ST elevation, consider inferior injury No STEMI Confirmed by Octaviano Glow [phone]) on 05/18/2020 9:52:54 AM   Radiology DG Chest 2 View  Result Date: 05/18/2020 CLINICAL DATA:  Fever.  Hypotension.  Question aspiration. EXAM: CHEST - 2 VIEW COMPARISON:  09/07/2019 FINDINGS: Heart and mediastinal shadows are normal. There is patchy bronchopneumonia in the lingula and left perihilar lung. No dense consolidation, collapse or  effusion. No acute bone finding. IMPRESSION: Patchy bronchopneumonia in the lingula and left perihilar lung. No dense consolidation or collapse. Electronically Signed   By: Nelson Chimes M.D.   On: 05/18/2020 10:03    Procedures .Critical Care Performed by: Wyvonnia Dusky, MD Authorized by: Wyvonnia Dusky, MD   Critical care provider statement:    Critical care time (minutes):  45   Critical care was necessary to treat or prevent imminent or life-threatening deterioration of the following conditions:  Sepsis   Critical care was time spent personally by me on the following activities:  Discussions with consultants, evaluation of patient's response to treatment, examination of patient, ordering and performing treatments and interventions, ordering and review of laboratory studies, ordering and review of radiographic studies, pulse oximetry, re-evaluation of patient's condition, obtaining history from patient or surrogate and review of old charts   (including critical care time)  Medications Ordered in ED Medications  Ampicillin-Sulbactam (UNASYN) 3 g in sodium chloride 0.9 % 100 mL IVPB (3 g Intravenous New Bag/Given 05/18/20 1122)  lactated ringers infusion ( Intravenous New Bag/Given 05/18/20 1207)  amLODipine (NORVASC) tablet 10 mg (has no administration in time range)  cloNIDine (CATAPRES) tablet 0.1 mg (0.1 mg Oral Refused 05/18/20 1213)  docusate sodium (COLACE) capsule 50 mg (has no administration in time range)  famotidine (PEPCID) tablet 40 mg (has no administration in time range)  furosemide (LASIX) tablet 20 mg (20 mg Oral Refused 05/18/20 1213)  levothyroxine (SYNTHROID) tablet 125 mcg (has no administration in time range)  metoprolol succinate (TOPROL-XL) 24 hr tablet 25 mg (has no administration in time range)  pantoprazole (PROTONIX) EC tablet 40 mg (40 mg Oral Not Given 05/18/20 1101)  QUEtiapine (SEROQUEL) tablet 400 mg (has no administration in time range)  LORazepam  (ATIVAN) tablet 0.5 mg (0.5 mg Oral Given 05/18/20 1116)  0.9 %  sodium chloride infusion (25 mLs Intravenous New Bag/Given 05/18/20 1121)  lactated ringers bolus 2,000 mL (0 mLs Intravenous Stopped 05/18/20 1203)  acetaminophen (TYLENOL) tablet 650 mg (650 mg Oral Given 05/18/20 3007)    ED Course  I have reviewed the triage vital signs and the nursing notes.  Pertinent labs & imaging results that were available during my care of the patient were reviewed by me and considered in my medical decision making (see chart for details).  This patient complains of weakness, fever, fatigue.  This involves an extensive number of treatment options, and is a complaint that carries with it a high risk of complications and morbidity.  The differential diagnosis includes sepsis/infection vs arrhythmia vs anemia vs other  Most likely aspiration PNA from this clinical presentation. Also possibly a side effect to his covid booster yesterday, although his wife reports he was feeling unwell for a few days prior to that injection too.  Less likely CO poisoning with wife who lives with him feeling well  I ordered, reviewed, and interpreted labs, as noted below I ordered medication IV fluids, IV antibiotics, tylenol for sepsis and fever I ordered imaging studies which included dg chest I independently visualized and interpreted imaging which showed left lingula consolidation Additional history was obtained from his wife and GI physician    Clinical Course as of May 19 1327  Wed May 18, 2020  1019 Suspected PNA on xray, lactate 2.2., will order unasyn for aspiration coverage.    [MT]  1026 Activated code sepsis with SIRS criteria and elevated lactate with source likely PNA.  IV fluids per ideal body weight ordered, IV unasyn for suspected aspiration.    [MT]  1031 HR 130 bpm and sinus tachycardic on monitor.  This doesn't appear to be A Fib.  We'll continue fluids for now and reassess.  I'll admit to  hospitalist at this point for sepsis PNA.  Pt and wife updated and in agreement.   [MT]  6226 Report given to Dr Neysa Bonito hospitalist.    [MT]  3335 UA without evidence of infection.  HR improving with IVF   [MT]    Clinical Course User Index [MT] Tomer Chalmers, Carola Rhine, MD    Final Clinical Impression(s) / ED Diagnoses Final diagnoses:  Sepsis, due to unspecified organism, unspecified whether acute organ dysfunction present [facility])  Aspiration pneumonia of left lung, unspecified aspiration pneumonia type, unspecified part of lung [facility])    Rx / DC Orders ED Discharge Orders    None       Wyvonnia Dusky, MD 05/18/20 1328

## 2020-05-18 NOTE — ED Notes (Signed)
2 set Bl Cx drawn.

## 2020-05-18 NOTE — ED Notes (Signed)
ED Provider at bedside. 

## 2020-05-18 NOTE — Telephone Encounter (Signed)
Patient has an appointment on January 7th, office has no sooner appointments available at this time

## 2020-05-19 ENCOUNTER — Telehealth (HOSPITAL_BASED_OUTPATIENT_CLINIC_OR_DEPARTMENT_OTHER): Payer: Self-pay | Admitting: Emergency Medicine

## 2020-05-19 ENCOUNTER — Telehealth: Payer: Self-pay | Admitting: Gastroenterology

## 2020-05-19 LAB — BLOOD CULTURE ID PANEL (REFLEXED) - BCID2

## 2020-05-19 LAB — URINE CULTURE: Culture: NO GROWTH

## 2020-05-20 LAB — CULTURE, BLOOD (SINGLE): Special Requests: ADEQUATE

## 2020-05-23 LAB — CULTURE, BLOOD (SINGLE): Special Requests: ADEQUATE

## 2020-05-24 ENCOUNTER — Telehealth: Payer: Self-pay | Admitting: Emergency Medicine

## 2020-05-24 NOTE — Progress Notes (Signed)
ED Antimicrobial Stewardship Positive Culture Follow Up   [name] is an 79 y.o. male who presented to [facility] on 05/18/2020 with a chief complaint of sent by GI for concern of infection.  Chief Complaint  Patient presents with  . Fever  . Emesis    Recent Results (from the past 720 hour(s))  Resp Panel by RT-PCR (Flu A&B, Covid) Nasopharyngeal Swab     Status: None   Collection Time: 05/18/20  9:30 AM   Specimen: Nasopharyngeal Swab; Nasopharyngeal(NP) swabs in vial transport medium  Result Value Ref Range Status   SARS Coronavirus 2 by RT PCR NEGATIVE NEGATIVE Final    Comment: (NOTE) SARS-CoV-2 target nucleic acids are NOT DETECTED.  The SARS-CoV-2 RNA is generally detectable in upper respiratory specimens during the acute phase of infection. The lowest concentration of SARS-CoV-2 viral copies this assay can detect is 138 copies/mL. A negative result does not preclude SARS-Cov-2 infection and should not be used as the sole basis for treatment or other patient management decisions. A negative result may occur with  improper specimen collection/handling, submission of specimen other than nasopharyngeal swab, presence of viral mutation(s) within the areas targeted by this assay, and inadequate number of viral copies(<138 copies/mL). A negative result must be combined with clinical observations, patient history, and epidemiological information. The expected result is Negative.  Fact Sheet for Patients:  EntrepreneurPulse.com.au  Fact Sheet for Healthcare Providers:  IncredibleEmployment.be  This test is no t yet approved or cleared by the [place] FDA and  has been authorized for detection and/or diagnosis of SARS-CoV-2 by FDA under an Emergency Use Authorization (EUA). This EUA will remain  in effect (meaning this test can be used) for the duration of the COVID-19 declaration under Section 564(b)(1) of the Act,  21 U.S.C.section 360bbb-3(b)(1), unless the authorization is terminated  or revoked sooner.       Influenza A by PCR NEGATIVE NEGATIVE Final   Influenza B by PCR NEGATIVE NEGATIVE Final    Comment: (NOTE) The Xpert Xpress SARS-CoV-2/FLU/RSV plus assay is intended as an aid in the diagnosis of influenza from Nasopharyngeal swab specimens and should not be used as a sole basis for treatment. Nasal washings and aspirates are unacceptable for Xpert Xpress SARS-CoV-2/FLU/RSV testing.  Fact Sheet for Patients: EntrepreneurPulse.com.au  Fact Sheet for Healthcare Providers: IncredibleEmployment.be  This test is not yet approved or cleared by the [place] FDA and has been authorized for detection and/or diagnosis of SARS-CoV-2 by FDA under an Emergency Use Authorization (EUA). This EUA will remain in effect (meaning this test can be used) for the duration of the COVID-19 declaration under Section 564(b)(1) of the Act, 21 U.S.C. section 360bbb-3(b)(1), unless the authorization is terminated or revoked.  Performed at [facility], [address] [place], [place] [address]   Blood culture (routine single)     Status: Abnormal   Collection Time: 05/18/20  9:56 AM   Specimen: BLOOD RIGHT HAND  Result Value Ref Range Status   Specimen Description   Final    BLOOD RIGHT HAND Performed at [facility], [address] [place], [place] [address]    Special Requests   Final    BOTTLES DRAWN AEROBIC AND ANAEROBIC Blood Culture adequate volume Performed at [facility], [address] [place], [place] [address]    Culture  Setup Time   Final    GRAM POSITIVE COCCI IN CLUSTERS AEROBIC BOTTLE ONLY  Organism ID to follow CRITICAL RESULT CALLED TO, READ BACK BY AND VERIFIED WITH: Rosemarie Ax RN 8:50 05/19/20 ([email or web address])    Culture (A)  Final    STAPHYLOCOCCUS EPIDERMIDIS THE SIGNIFICANCE OF ISOLATING THIS ORGANISM FROM A  SINGLE SET OF BLOOD CULTURES WHEN MULTIPLE SETS ARE DRAWN IS UNCERTAIN. PLEASE NOTIFY THE MICROBIOLOGY DEPARTMENT WITHIN ONE WEEK IF SPECIATION AND SENSITIVITIES ARE REQUIRED. Performed at [facility], [place] [address], [place], [place] [address]    Report Status 05/20/2020 FINAL  Final  Blood Culture ID Panel (Reflexed)     Status: Abnormal   Collection Time: 05/18/20  9:56 AM  Result Value Ref Range Status   Enterococcus faecalis NOT DETECTED NOT DETECTED Final   Enterococcus Faecium NOT DETECTED NOT DETECTED Final   Listeria monocytogenes NOT DETECTED NOT DETECTED Final   Staphylococcus species DETECTED (A) NOT DETECTED Final    Comment: CRITICAL RESULT CALLED TO, READ BACK BY AND VERIFIED WITH: Rosemarie Ax RN 8:50 05/19/20 ([email or web address])    Staphylococcus aureus (BCID) NOT DETECTED NOT DETECTED Final   Staphylococcus epidermidis DETECTED (A) NOT DETECTED Final    Comment: CRITICAL RESULT CALLED TO, READ BACK BY AND VERIFIED WITH: Rosemarie Ax RN 8:50 05/19/20 ([email or web address])    Staphylococcus lugdunensis NOT DETECTED NOT DETECTED Final   Streptococcus species NOT DETECTED NOT DETECTED Final   Streptococcus agalactiae NOT DETECTED NOT DETECTED Final   Streptococcus pneumoniae NOT DETECTED NOT DETECTED Final   Streptococcus pyogenes NOT DETECTED NOT DETECTED Final   A.calcoaceticus-baumannii NOT DETECTED NOT DETECTED Final   Bacteroides fragilis NOT DETECTED NOT DETECTED Final   Enterobacterales NOT DETECTED NOT DETECTED Final   Enterobacter cloacae complex NOT DETECTED NOT DETECTED Final   Escherichia coli NOT DETECTED NOT DETECTED Final   Klebsiella aerogenes NOT DETECTED NOT DETECTED Final   Klebsiella oxytoca NOT DETECTED NOT DETECTED Final   Klebsiella pneumoniae NOT DETECTED NOT DETECTED Final   Proteus species NOT DETECTED NOT DETECTED Final   Salmonella species NOT DETECTED NOT DETECTED Final   Serratia marcescens NOT DETECTED NOT DETECTED Final   Haemophilus influenzae NOT DETECTED NOT  DETECTED Final   Neisseria meningitidis NOT DETECTED NOT DETECTED Final   Pseudomonas aeruginosa NOT DETECTED NOT DETECTED Final   Stenotrophomonas maltophilia NOT DETECTED NOT DETECTED Final   Candida albicans NOT DETECTED NOT DETECTED Final   Candida auris NOT DETECTED NOT DETECTED Final   Candida glabrata NOT DETECTED NOT DETECTED Final   Candida krusei NOT DETECTED NOT DETECTED Final   Candida parapsilosis NOT DETECTED NOT DETECTED Final   Candida tropicalis NOT DETECTED NOT DETECTED Final   Cryptococcus neoformans/gattii NOT DETECTED NOT DETECTED Final   Methicillin resistance mecA/C NOT DETECTED NOT DETECTED Final    Comment: Performed at [facility], [address] [place], [place] [address]  Culture, blood (single)     Status: Abnormal   Collection Time: 05/18/20 10:09 AM   Specimen: BLOOD LEFT ARM  Result Value Ref Range Status   Specimen Description   Final    BLOOD LEFT ARM Performed at [facility], [address] [place], [place] [address]    Special Requests   Final    BOTTLES DRAWN AEROBIC AND ANAEROBIC Blood Culture adequate volume Performed at [facility], [address] [place], [place] [address]    Culture  Setup Time   Final    GRAM POSITIVE RODS AEROBIC BOTTLE ONLY CRITICAL RESULT CALLED TO, READ BACK BY AND VERIFIED  WITH: RN SOPHIE GAUGE [phone] [id number] FCP    Culture (A)  Final    ACTINOMYCES NEUII Standardized susceptibility testing for this organism is not available. Performed at [facility], [place] [address] [place], [place] [address]    Report Status 05/23/2020 FINAL  Final  Urine culture     Status: None   Collection Time: 05/18/20 11:27 AM   Specimen: Urine, Clean Catch  Result Value Ref Range Status   Specimen Description   Final    URINE, CLEAN CATCH Performed at [facility], [address] [place], [place] [address]    Special Requests   Final    NONE Performed at [facility], [address] [place], [place] [address]    Culture   Final    NO GROWTH Performed at [facility], [place] [address], [place], [place] [address]    Report Status 05/19/2020 FINAL  Final    Recommended to return for inpatient admission.   ED Provider: Madaline Savage, PA-C   Henri Medal 05/24/2020, 1:54 PM Clinical Pharmacist Monday - Friday phone -  [phone] Saturday - Sunday phone - [phone]

## 2020-05-24 NOTE — Telephone Encounter (Signed)
Post ED Visit - Positive Culture Follow-up: Successful Patient Follow-Up  Culture assessed and recommendations reviewed by:  []  Elenor Quinones, Pharm.D. []  Heide Guile, Pharm.D., BCPS AQ-ID []  Parks Neptune, Pharm.D., BCPS []  Alycia Rossetti, Pharm.D., BCPS []  [place], [place].D., BCPS, AAHIVP []  Legrand Como, Pharm.D., BCPS, AAHIVP []  Salome Arnt, PharmD, BCPS []  Johnnette Gourd, PharmD, BCPS []  Hughes Better, PharmD, BCPS []  Leeroy Cha, PharmD  Positive blood culture  []  Patient discharged without antimicrobial prescription and treatment is now indicated [x]  Organism is resistant to prescribed ED discharge antimicrobial []  Patient with positive blood cultures  Changes discussed with ED provider: Silverio Decamp PA Advised wife his POA, that patient needs to return to hospital for inpatient admitssion for bacteremia, verbalizes understanding of same    Hazle Nordmann 05/24/2020, 3:01 PM

## 2020-06-02 DIAGNOSIS — E039 Hypothyroidism, unspecified: Secondary | ICD-10-CM | POA: Diagnosis not present

## 2020-06-02 DIAGNOSIS — Z0000 Encounter for general adult medical examination without abnormal findings: Secondary | ICD-10-CM | POA: Diagnosis not present

## 2020-06-02 DIAGNOSIS — R6889 Other general symptoms and signs: Secondary | ICD-10-CM | POA: Diagnosis not present

## 2020-06-02 DIAGNOSIS — Z6829 Body mass index (BMI) 29.0-29.9, adult: Secondary | ICD-10-CM | POA: Diagnosis not present

## 2020-06-09 DIAGNOSIS — F329 Major depressive disorder, single episode, unspecified: Secondary | ICD-10-CM | POA: Diagnosis not present

## 2020-06-09 DIAGNOSIS — K219 Gastro-esophageal reflux disease without esophagitis: Secondary | ICD-10-CM | POA: Diagnosis not present

## 2020-06-09 DIAGNOSIS — I10 Essential (primary) hypertension: Secondary | ICD-10-CM | POA: Diagnosis not present

## 2020-06-17 DIAGNOSIS — Z6830 Body mass index (BMI) 30.0-30.9, adult: Secondary | ICD-10-CM | POA: Diagnosis not present

## 2020-06-17 DIAGNOSIS — Z923 Personal history of irradiation: Secondary | ICD-10-CM | POA: Diagnosis not present

## 2020-06-17 DIAGNOSIS — Z8701 Personal history of pneumonia (recurrent): Secondary | ICD-10-CM | POA: Diagnosis not present

## 2020-06-17 DIAGNOSIS — Z87891 Personal history of nicotine dependence: Secondary | ICD-10-CM | POA: Diagnosis not present

## 2020-06-17 DIAGNOSIS — Z85818 Personal history of malignant neoplasm of other sites of lip, oral cavity, and pharynx: Secondary | ICD-10-CM | POA: Diagnosis not present

## 2020-06-17 DIAGNOSIS — J383 Other diseases of vocal cords: Secondary | ICD-10-CM | POA: Diagnosis not present

## 2020-06-17 DIAGNOSIS — R1314 Dysphagia, pharyngoesophageal phase: Secondary | ICD-10-CM | POA: Diagnosis not present

## 2020-06-17 DIAGNOSIS — K225 Diverticulum of esophagus, acquired: Secondary | ICD-10-CM | POA: Diagnosis not present

## 2020-06-17 DIAGNOSIS — Z9221 Personal history of antineoplastic chemotherapy: Secondary | ICD-10-CM | POA: Diagnosis not present

## 2020-06-17 DIAGNOSIS — R634 Abnormal weight loss: Secondary | ICD-10-CM | POA: Diagnosis not present

## 2020-06-28 DIAGNOSIS — R5381 Other malaise: Secondary | ICD-10-CM | POA: Diagnosis not present

## 2020-06-28 DIAGNOSIS — E7800 Pure hypercholesterolemia, unspecified: Secondary | ICD-10-CM | POA: Diagnosis not present

## 2020-06-28 DIAGNOSIS — R5383 Other fatigue: Secondary | ICD-10-CM | POA: Diagnosis not present

## 2020-06-28 DIAGNOSIS — Z6831 Body mass index (BMI) 31.0-31.9, adult: Secondary | ICD-10-CM | POA: Diagnosis not present

## 2020-06-28 DIAGNOSIS — Z79899 Other long term (current) drug therapy: Secondary | ICD-10-CM | POA: Diagnosis not present

## 2020-06-28 DIAGNOSIS — R269 Unspecified abnormalities of gait and mobility: Secondary | ICD-10-CM | POA: Diagnosis not present

## 2020-07-10 DIAGNOSIS — I10 Essential (primary) hypertension: Secondary | ICD-10-CM | POA: Diagnosis not present

## 2020-07-10 DIAGNOSIS — E039 Hypothyroidism, unspecified: Secondary | ICD-10-CM | POA: Diagnosis not present

## 2020-07-10 DIAGNOSIS — E7800 Pure hypercholesterolemia, unspecified: Secondary | ICD-10-CM | POA: Diagnosis not present

## 2020-07-25 DIAGNOSIS — Z6829 Body mass index (BMI) 29.0-29.9, adult: Secondary | ICD-10-CM | POA: Diagnosis not present

## 2020-07-25 DIAGNOSIS — S2242XA Multiple fractures of ribs, left side, initial encounter for closed fracture: Secondary | ICD-10-CM | POA: Diagnosis not present

## 2020-07-25 DIAGNOSIS — R110 Nausea: Secondary | ICD-10-CM | POA: Diagnosis not present

## 2020-07-27 ENCOUNTER — Other Ambulatory Visit: Payer: Self-pay | Admitting: Gastroenterology

## 2020-08-10 DIAGNOSIS — R933 Abnormal findings on diagnostic imaging of other parts of digestive tract: Secondary | ICD-10-CM | POA: Diagnosis not present

## 2020-08-10 DIAGNOSIS — R1310 Dysphagia, unspecified: Secondary | ICD-10-CM | POA: Diagnosis not present

## 2020-08-10 DIAGNOSIS — K225 Diverticulum of esophagus, acquired: Secondary | ICD-10-CM | POA: Diagnosis not present

## 2020-09-14 DIAGNOSIS — R1312 Dysphagia, oropharyngeal phase: Secondary | ICD-10-CM | POA: Diagnosis not present

## 2020-09-14 DIAGNOSIS — R1314 Dysphagia, pharyngoesophageal phase: Secondary | ICD-10-CM | POA: Diagnosis not present

## 2020-09-14 DIAGNOSIS — K225 Diverticulum of esophagus, acquired: Secondary | ICD-10-CM | POA: Diagnosis not present

## 2020-09-14 DIAGNOSIS — R933 Abnormal findings on diagnostic imaging of other parts of digestive tract: Secondary | ICD-10-CM | POA: Diagnosis not present

## 2020-09-23 DIAGNOSIS — G4700 Insomnia, unspecified: Secondary | ICD-10-CM | POA: Diagnosis not present

## 2020-09-23 DIAGNOSIS — F314 Bipolar disorder, current episode depressed, severe, without psychotic features: Secondary | ICD-10-CM | POA: Diagnosis not present

## 2020-09-23 DIAGNOSIS — F411 Generalized anxiety disorder: Secondary | ICD-10-CM | POA: Diagnosis not present

## 2020-10-03 DIAGNOSIS — R1312 Dysphagia, oropharyngeal phase: Secondary | ICD-10-CM | POA: Diagnosis not present

## 2020-10-17 DIAGNOSIS — R1312 Dysphagia, oropharyngeal phase: Secondary | ICD-10-CM | POA: Diagnosis not present

## 2020-10-21 ENCOUNTER — Other Ambulatory Visit: Payer: Self-pay | Admitting: Gastroenterology

## 2020-11-02 DIAGNOSIS — M5136 Other intervertebral disc degeneration, lumbar region: Secondary | ICD-10-CM | POA: Insufficient documentation

## 2020-11-02 DIAGNOSIS — E079 Disorder of thyroid, unspecified: Secondary | ICD-10-CM | POA: Insufficient documentation

## 2020-11-02 DIAGNOSIS — C099 Malignant neoplasm of tonsil, unspecified: Secondary | ICD-10-CM | POA: Insufficient documentation

## 2020-11-02 DIAGNOSIS — G629 Polyneuropathy, unspecified: Secondary | ICD-10-CM | POA: Insufficient documentation

## 2020-11-02 DIAGNOSIS — K209 Esophagitis, unspecified without bleeding: Secondary | ICD-10-CM | POA: Insufficient documentation

## 2020-11-02 DIAGNOSIS — H269 Unspecified cataract: Secondary | ICD-10-CM | POA: Insufficient documentation

## 2020-11-02 DIAGNOSIS — F32A Depression, unspecified: Secondary | ICD-10-CM | POA: Insufficient documentation

## 2020-11-02 DIAGNOSIS — I10 Essential (primary) hypertension: Secondary | ICD-10-CM | POA: Insufficient documentation

## 2020-11-02 DIAGNOSIS — F419 Anxiety disorder, unspecified: Secondary | ICD-10-CM | POA: Insufficient documentation

## 2020-11-02 DIAGNOSIS — E7800 Pure hypercholesterolemia, unspecified: Secondary | ICD-10-CM | POA: Insufficient documentation

## 2020-11-02 DIAGNOSIS — C61 Malignant neoplasm of prostate: Secondary | ICD-10-CM | POA: Insufficient documentation

## 2020-11-03 ENCOUNTER — Other Ambulatory Visit: Payer: Self-pay

## 2020-11-03 ENCOUNTER — Encounter: Payer: Self-pay | Admitting: Cardiology

## 2020-11-03 ENCOUNTER — Ambulatory Visit (INDEPENDENT_AMBULATORY_CARE_PROVIDER_SITE_OTHER): Payer: Medicare Other | Admitting: Cardiology

## 2020-11-03 VITALS — BP 148/76 | HR 74 | Ht 68.0 in | Wt 208.6 lb

## 2020-11-03 DIAGNOSIS — I4891 Unspecified atrial fibrillation: Secondary | ICD-10-CM

## 2020-11-03 DIAGNOSIS — I10 Essential (primary) hypertension: Secondary | ICD-10-CM

## 2020-11-03 DIAGNOSIS — I350 Nonrheumatic aortic (valve) stenosis: Secondary | ICD-10-CM | POA: Diagnosis not present

## 2020-11-03 NOTE — Progress Notes (Signed)
Cardiology Office Note:    Date:  11/03/2020   ID:  [name], DOB [birth year], MRN [id number]  PCP:  [name], MD  Cardiologist:  Jenean Lindau, MD   Referring MD: [name], MD    ASSESSMENT:    1. Essential hypertension   2. Mild aortic stenosis   3. Atrial fibrillation, unspecified type ([place])    PLAN:    In order of problems listed above:  1. Primary prevention stressed with the patient.  Importance of compliance with diet medication stressed and he vocalized understanding. 2. Essential hypertension: Blood pressure stable and diet was emphasized.  Lifestyle modification was urged.  He and his wife mentioned that he walks on a regular basis.  To keep a track of the blood pressures at home and they are fine according to the patient and the numbers he mentioned. 3. Atrial fibrillation post ablation: Stable.  There are no recurrences of atrial fibrillation.  The have checked the [facility] app several times with no atrial fibrillation.  Again issues of anticoagulation discussed and they are not keen on pursuing anticoagulation.  I respect their wishes.  Benefits and risks explained and the vocalized understanding. 4. Mild aortic stenosis: Stable at this time. 5. Patient will be seen in follow-up appointment in 6 months or earlier if the patient has any concerns    Medication Adjustments/Labs and Tests Ordered: Current medicines are reviewed at length with the patient today.  Concerns regarding medicines are outlined above.  No orders of the defined types were placed in this encounter.  No orders of the defined types were placed in this encounter.    No chief complaint on file.    History of Present Illness:    [name] is a 80 y.o. male.  Patient has history of atrial fibrillation and he mentions to me that he underwent ablation in the past.  He denies any chest pain orthopnea or PND.  At the time of my evaluation, the patient is alert  awake oriented and in no distress.  He walks on a regular basis.  Past Medical History:  Diagnosis Date  . Acquired hypothyroidism 08/17/2014  . Acute MI, true posterior wall, subsequent episode of care ([place]) 08/15/2014   EF 44% with mildly reduced LV function  Formatting of this note might be different from the original. EF 44% with mildly reduced LV function  . AKI (acute kidney injury) ([place]) 09/20/2014  . Anxiety   . Atrial fibrillation ([place]) 09/28/2019   ablation in past  Formatting of this note might be different from the original. ablation in past  . Bipolar disorder ([place])    w/dementia/psychotic episodes  . Bradycardia 09/17/2014  . Cancer ([place]) 09/28/2019   Prostate Cancer; Throat Cancer  Formatting of this note might be different from the original. Prostate Cancer; Throat Cancer  . Cardiac murmur 09/28/2019  . Cataract   . Cervical radicular pain 07/16/2019  . Cervical spondylosis 06/19/2019  . Chest pain with high risk for cardiac etiology 08/19/2014  . Chronic depression   . Chronic pain due to trauma 07/16/2019  . Corn of toe 12/11/2018  . DDD (degenerative disc disease), cervical 09/29/2018  . Degenerative lumbar disc   . Dementia without behavioral disturbance ([place]) 07/16/2019  . Disease characterized by destruction of skeletal muscle 08/15/2014  . Essential hypertension 09/28/2019  . Essential tremor 09/21/2014  . History of MI (myocardial infarction) 09/14/2014  . Hypercholesteremia   . Hypertension   .  Hypotension 09/14/2014  . Malignant neoplasm of prostate ([place])   . Mild aortic stenosis 11/26/2019  . Mood change 10/12/2019  . Myocardial infarction ([place]) 08/10/2014  . Nausea and vomiting 08/19/2014  . Onychomycosis of left great toe 08/21/2014  . Pain of fifth toe 12/11/2018  . Peptic esophagitis   . Peripheral neuropathy   . Peripheral neuropathy due to chemotherapy ([place]) 09/21/2014  . Prolonged Q-T interval on ECG 08/19/2014  . Prostate cancer ([place]) 09/28/2019  . Reactive depression 10/12/2019   . Renal insufficiency 08/17/2014  . Right bundle branch block 09/28/2019  . Sepsis ([place]) 05/18/2020  . Small fiber neuropathy 12/16/2018  . Squamous cell carcinoma of left tonsil (HCC)   . Throat cancer ([place])   . Thyroid disease   . Transaminitis 08/17/2014  . Unstable angina ([place]) 09/14/2014  . Whiplash injury syndrome, sequela 08/11/2018    Past Surgical History:  Procedure Laterality Date  . CARDIAC ELECTROPHYSIOLOGY STUDY AND ABLATION    . COLONOSCOPY  04/07/2007   Small colonic polyp status post polypectomy. Pancolonic diverticulosis predominantly in the sigmoid colon. Internal hemorrhoids.   . ESOPHAGOGASTRODUODENOSCOPY  06/15/2010   Erosive esophagitis. Status post PEG placment  . EYE SURGERY    . INGUINAL HERNIA REPAIR    . PROSTATE BIOPSY    . SEBACEOUS CYST REMOVAL    . TONSILLECTOMY      Current Medications: Current Meds  Medication Sig  . amLODipine (NORVASC) 10 MG tablet Take 10 mg by mouth daily.  . busPIRone (BUSPAR) 10 MG tablet Take 10 mg by mouth as needed for anxiety.  . cloNIDine (CATAPRES) 0.1 MG tablet Take 0.1 mg by mouth daily.  Mariane Baumgarten Sodium (STOOL SOFTENER LAXATIVE PO) Take 1 tablet by mouth as needed for constipation.  . famotidine (PEPCID) 40 MG tablet Take 40 mg by mouth daily.  . furosemide (LASIX) 20 MG tablet Take 20 mg by mouth daily as needed for edema or fluid.  Marland Kitchen ibuprofen (ADVIL) 200 MG tablet Take 200 mg by mouth every 6 (six) hours as needed for pain.  Marland Kitchen levothyroxine (SYNTHROID) 125 MCG tablet Take 125 mcg by mouth daily.  Marland Kitchen LORazepam (ATIVAN) 0.5 MG tablet Take 0.5 mg by mouth daily.  . metoprolol succinate (TOPROL-XL) 25 MG 24 hr tablet Take 25 mg by mouth daily.  Marland Kitchen oxybutynin (DITROPAN-XL) 10 MG 24 hr tablet Take 10 mg by mouth daily.  . pantoprazole (PROTONIX) 40 MG tablet Take 40 mg by mouth 2 (two) times daily.  . polyethylene glycol (MIRALAX / GLYCOLAX) 17 g packet Take 17 g by mouth daily as needed for constipation.  . QUEtiapine  (SEROQUEL) 400 MG tablet Take 1 tablet by mouth every evening.  . traZODone (DESYREL) 50 MG tablet Take 50-100 mg by mouth as needed for depression.     Allergies:   Trileptal [oxcarbazepine]   Social History   Socioeconomic History  . Marital status: Married    Spouse name: Not on file  . Number of children: 3  . Years of education: 10th grade  . Highest education level: Not on file  Occupational History  . Occupation: Retired  Tobacco Use  . Smoking status: Former Smoker    Quit date: 2011    Years since quitting: 11.4  . Smokeless tobacco: Former Network engineer  . Vaping Use: Never used  Substance and Sexual Activity  . Alcohol use: Not Currently  . Drug use: Never  . Sexual activity: Not on file  Other Topics  Concern  . Not on file  Social History Narrative   Lives at home with wife.   Right-handed.   2 cups of caffeine per day.   Social Determinants of Health   Financial Resource Strain: Not on file  Food Insecurity: Not on file  Transportation Needs: Not on file  Physical Activity: Not on file  Stress: Not on file  Social Connections: Not on file     Family History: The patient's family history includes Heart disease in his father; Other in his mother; Prostate cancer in his father; Stomach cancer in his father. There is no history of Colon cancer, Esophageal cancer, or Rectal cancer.  ROS:   Please see the history of present illness.    All other systems reviewed and are negative.  EKGs/Labs/Other Studies Reviewed:    The following studies were reviewed today: I discussed my findings with the patient at length.   Recent Labs: 05/18/2020: ALT 25; B Natriuretic Peptide 154.6; BUN 12; Creatinine, Ser 1.40; Hemoglobin 12.8; Platelets 201; Potassium 3.5; Sodium 139  Recent Lipid Panel No results found for: CHOL, TRIG, HDL, CHOLHDL, VLDL, LDLCALC, LDLDIRECT  Physical Exam:    VS:  BP (!) 148/76   Pulse 74   Ht 5\' 8"  (1.727 m)   Wt 208 lb 9.6 oz  (94.6 kg)   SpO2 97%   BMI 31.72 kg/m     Wt Readings from Last 3 Encounters:  11/03/20 208 lb 9.6 oz (94.6 kg)  05/18/20 204 lb 4 oz (92.6 kg)  04/11/20 203 lb 9.6 oz (92.4 kg)     GEN: Patient is in no acute distress HEENT: Normal NECK: No JVD; No carotid bruits LYMPHATICS: No lymphadenopathy CARDIAC: Hear sounds regular, 2/6 systolic murmur at the apex. RESPIRATORY:  Clear to auscultation without rales, wheezing or rhonchi  ABDOMEN: Soft, non-tender, non-distended MUSCULOSKELETAL:  No edema; No deformity  SKIN: Warm and dry NEUROLOGIC:  Alert and oriented x 3 PSYCHIATRIC:  Normal affect   Signed, Jenean Lindau, MD  11/03/2020 1:26 PM    [facility]

## 2020-11-03 NOTE — Patient Instructions (Signed)

## 2020-11-17 DIAGNOSIS — R634 Abnormal weight loss: Secondary | ICD-10-CM | POA: Diagnosis not present

## 2020-11-17 DIAGNOSIS — R1310 Dysphagia, unspecified: Secondary | ICD-10-CM | POA: Diagnosis not present

## 2020-11-17 DIAGNOSIS — R1319 Other dysphagia: Secondary | ICD-10-CM | POA: Diagnosis not present

## 2020-11-30 DIAGNOSIS — D225 Melanocytic nevi of trunk: Secondary | ICD-10-CM | POA: Diagnosis not present

## 2020-11-30 DIAGNOSIS — D2239 Melanocytic nevi of other parts of face: Secondary | ICD-10-CM | POA: Diagnosis not present

## 2020-11-30 DIAGNOSIS — L820 Inflamed seborrheic keratosis: Secondary | ICD-10-CM | POA: Diagnosis not present

## 2020-11-30 DIAGNOSIS — L570 Actinic keratosis: Secondary | ICD-10-CM | POA: Diagnosis not present

## 2020-11-30 DIAGNOSIS — L821 Other seborrheic keratosis: Secondary | ICD-10-CM | POA: Diagnosis not present

## 2020-12-23 DIAGNOSIS — F411 Generalized anxiety disorder: Secondary | ICD-10-CM | POA: Diagnosis not present

## 2020-12-23 DIAGNOSIS — G4700 Insomnia, unspecified: Secondary | ICD-10-CM | POA: Diagnosis not present

## 2020-12-23 DIAGNOSIS — F314 Bipolar disorder, current episode depressed, severe, without psychotic features: Secondary | ICD-10-CM | POA: Diagnosis not present

## 2021-01-02 DIAGNOSIS — Z23 Encounter for immunization: Secondary | ICD-10-CM | POA: Diagnosis not present

## 2021-01-08 DIAGNOSIS — E7800 Pure hypercholesterolemia, unspecified: Secondary | ICD-10-CM | POA: Diagnosis not present

## 2021-01-08 DIAGNOSIS — E039 Hypothyroidism, unspecified: Secondary | ICD-10-CM | POA: Diagnosis not present

## 2021-01-08 DIAGNOSIS — I10 Essential (primary) hypertension: Secondary | ICD-10-CM | POA: Diagnosis not present

## 2021-01-11 DIAGNOSIS — K319 Disease of stomach and duodenum, unspecified: Secondary | ICD-10-CM | POA: Diagnosis not present

## 2021-01-11 DIAGNOSIS — Z9221 Personal history of antineoplastic chemotherapy: Secondary | ICD-10-CM | POA: Diagnosis not present

## 2021-01-11 DIAGNOSIS — R1310 Dysphagia, unspecified: Secondary | ICD-10-CM | POA: Diagnosis not present

## 2021-01-11 DIAGNOSIS — K2970 Gastritis, unspecified, without bleeding: Secondary | ICD-10-CM | POA: Diagnosis not present

## 2021-01-11 DIAGNOSIS — Z8719 Personal history of other diseases of the digestive system: Secondary | ICD-10-CM | POA: Diagnosis not present

## 2021-01-11 DIAGNOSIS — K219 Gastro-esophageal reflux disease without esophagitis: Secondary | ICD-10-CM | POA: Diagnosis not present

## 2021-01-11 DIAGNOSIS — Z923 Personal history of irradiation: Secondary | ICD-10-CM | POA: Diagnosis not present

## 2021-01-11 DIAGNOSIS — I252 Old myocardial infarction: Secondary | ICD-10-CM | POA: Diagnosis not present

## 2021-01-11 DIAGNOSIS — K449 Diaphragmatic hernia without obstruction or gangrene: Secondary | ICD-10-CM | POA: Diagnosis not present

## 2021-01-11 DIAGNOSIS — Z8546 Personal history of malignant neoplasm of prostate: Secondary | ICD-10-CM | POA: Diagnosis not present

## 2021-01-11 DIAGNOSIS — Z85818 Personal history of malignant neoplasm of other sites of lip, oral cavity, and pharynx: Secondary | ICD-10-CM | POA: Diagnosis not present

## 2021-01-11 DIAGNOSIS — K3189 Other diseases of stomach and duodenum: Secondary | ICD-10-CM | POA: Diagnosis not present

## 2021-01-11 DIAGNOSIS — K2289 Other specified disease of esophagus: Secondary | ICD-10-CM | POA: Diagnosis not present

## 2021-01-11 DIAGNOSIS — I4891 Unspecified atrial fibrillation: Secondary | ICD-10-CM | POA: Diagnosis not present

## 2021-01-11 DIAGNOSIS — Z87891 Personal history of nicotine dependence: Secondary | ICD-10-CM | POA: Diagnosis not present

## 2021-01-11 DIAGNOSIS — I10 Essential (primary) hypertension: Secondary | ICD-10-CM | POA: Diagnosis not present

## 2021-01-11 DIAGNOSIS — Z79899 Other long term (current) drug therapy: Secondary | ICD-10-CM | POA: Diagnosis not present

## 2021-01-18 DIAGNOSIS — E039 Hypothyroidism, unspecified: Secondary | ICD-10-CM | POA: Diagnosis not present

## 2021-01-18 DIAGNOSIS — I10 Essential (primary) hypertension: Secondary | ICD-10-CM | POA: Diagnosis not present

## 2021-01-18 DIAGNOSIS — Z79899 Other long term (current) drug therapy: Secondary | ICD-10-CM | POA: Diagnosis not present

## 2021-01-18 DIAGNOSIS — K209 Esophagitis, unspecified without bleeding: Secondary | ICD-10-CM | POA: Diagnosis not present

## 2021-01-18 DIAGNOSIS — N400 Enlarged prostate without lower urinary tract symptoms: Secondary | ICD-10-CM | POA: Diagnosis not present

## 2021-01-18 DIAGNOSIS — Z6831 Body mass index (BMI) 31.0-31.9, adult: Secondary | ICD-10-CM | POA: Diagnosis not present

## 2021-01-20 ENCOUNTER — Other Ambulatory Visit: Payer: Self-pay | Admitting: Gastroenterology

## 2021-01-20 DIAGNOSIS — R1310 Dysphagia, unspecified: Secondary | ICD-10-CM

## 2021-04-11 ENCOUNTER — Encounter: Payer: Self-pay | Admitting: Student in an Organized Health Care Education/Training Program

## 2021-04-11 ENCOUNTER — Other Ambulatory Visit: Payer: Self-pay | Admitting: Gastroenterology

## 2021-04-11 ENCOUNTER — Other Ambulatory Visit: Payer: Self-pay

## 2021-04-11 ENCOUNTER — Ambulatory Visit
Payer: Medicare Other | Attending: Student in an Organized Health Care Education/Training Program | Admitting: Student in an Organized Health Care Education/Training Program

## 2021-04-11 DIAGNOSIS — M5412 Radiculopathy, cervical region: Secondary | ICD-10-CM | POA: Diagnosis not present

## 2021-04-11 DIAGNOSIS — S134XXS Sprain of ligaments of cervical spine, sequela: Secondary | ICD-10-CM

## 2021-04-11 DIAGNOSIS — M5030 Other cervical disc degeneration, unspecified cervical region: Secondary | ICD-10-CM | POA: Diagnosis not present

## 2021-04-11 DIAGNOSIS — G629 Polyneuropathy, unspecified: Secondary | ICD-10-CM

## 2021-04-11 DIAGNOSIS — F329 Major depressive disorder, single episode, unspecified: Secondary | ICD-10-CM

## 2021-04-11 DIAGNOSIS — G8921 Chronic pain due to trauma: Secondary | ICD-10-CM | POA: Diagnosis not present

## 2021-04-11 MED ORDER — HYDROCODONE-ACETAMINOPHEN 10-325 MG PO TABS: 1.0000 | ORAL_TABLET | Freq: Four times a day (QID) | ORAL | 0 refills | Status: DC | PRN

## 2021-04-11 NOTE — Progress Notes (Signed)
Patient: [name]  Service Category: E/M  Provider: Gillis Santa, MD  DOB: 07/26/1940  DOS: 04/11/2021  Location: Office  MRN: [id number]  Setting: Ambulatory outpatient  Referring Provider: [name], MD  Type: Established Patient  Specialty: Interventional Pain Management  PCP: [name], MD  Location: Home  Delivery: TeleHealth     Virtual Encounter - Pain Management PROVIDER NOTE: Information contained herein reflects review and annotations entered in association with encounter. Interpretation of such information and data should be left to medically-trained personnel. Information provided to patient can be located elsewhere in the medical record under "Patient Instructions". Document created using STT-dictation technology, any transcriptional errors that may result from process are unintentional.    Contact & Pharmacy Preferred: [phone] Home: [phone] (home) Mobile: [phone] (mobile) E-mail: No e-mail address on record  [facility] [place], [place] [address] [place] [address] Phone: [phone] Fax: [phone]  [id number] [place] [id number]: [phone]: [phone]  Pre-screening  [name] offered "in-person" vs "virtual" encounter. He indicated preferring virtual for this encounter.   Reason COVID-19*  Social distancing based on CDC and AMA recommendations.   I contacted [name] on 04/11/2021 via telephone.      I clearly identified myself as BGillis Santa MD. I verified that I was speaking with the correct person using two identifiers (Name: [name], and date of birth: [id number].  Consent I sought verbal advanced consent from JSeymourIII for virtual visit interactions. I informed Mr. AWesterveltof possible security and privacy concerns, risks, and limitations  associated with providing "not-in-person" medical evaluation and management services. I also informed Mr. AWarnellof the availability of "in-person" appointments. Finally, I informed him that there would be a charge for the virtual visit and that he could be  personally, fully or partially, financially responsible for it. Mr. AGambrellexpressed understanding and agreed to proceed.   Historic Elements   [name] is a 80y.o. year old, male patient evaluated today after our last contact on Visit date not found. [name] has a past medical history of Acquired hypothyroidism (08/17/2014), Acute MI, true posterior wall, subsequent episode of care ([facility] (08/15/2014), AKI (acute kidney injury) ([facility] (09/20/2014), Anxiety, Atrial fibrillation ([facility] (09/28/2019), Bipolar disorder ([facility], Bradycardia (09/17/2014), Cancer ([facility] (09/28/2019), Cardiac murmur (09/28/2019), Cataract, Cervical radicular pain (07/16/2019), Cervical spondylosis (06/19/2019), Chest pain with high risk for cardiac etiology (08/19/2014), Chronic depression, Chronic pain due to trauma (07/16/2019), Corn of toe (12/11/2018), DDD (degenerative disc disease), cervical (09/29/2018), Degenerative lumbar disc, Dementia without behavioral disturbance ([facility] (07/16/2019), Disease characterized by destruction of skeletal muscle (08/15/2014), Essential hypertension (09/28/2019), Essential tremor (09/21/2014), History of MI (myocardial infarction) (09/14/2014), Hypercholesteremia, Hypertension, Hypotension (09/14/2014), Malignant neoplasm of prostate ([facility], Mild aortic stenosis (11/26/2019), Mood change (10/12/2019), Myocardial infarction ([facility] (08/10/2014), Nausea and vomiting (08/19/2014), Onychomycosis of left great toe (08/21/2014), Pain of fifth toe (12/11/2018), Peptic esophagitis, Peripheral neuropathy, Peripheral neuropathy due to chemotherapy ([facility] (09/21/2014), Prolonged Q-T interval on ECG (08/19/2014), Prostate cancer ([facility] (09/28/2019), Reactive depression (10/12/2019), Renal  insufficiency (08/17/2014), Right bundle branch block (09/28/2019), Sepsis ([facility] (05/18/2020), Small fiber neuropathy (12/16/2018), Squamous cell carcinoma of left tonsil ([facility], Throat cancer ([facility], Thyroid disease, Transaminitis (08/17/2014), Unstable angina ([facility] (09/14/2014), and Whiplash injury syndrome, sequela (08/11/2018). He also  has  a past surgical history that includes Tonsillectomy; Inguinal hernia repair; Eye surgery; Cardiac electrophysiology study and ablation; Prostate biopsy; SEBACEOUS CYST REMOVAL; Esophagogastroduodenoscopy (06/15/2010); and Colonoscopy (04/07/2007). [name] has a current medication list which includes the following prescription(s): hydrocodone-acetaminophen, amlodipine, buspirone, clonidine, docusate sodium, famotidine, furosemide, ibuprofen, levothyroxine, lorazepam, metoprolol succinate, oxybutynin, pantoprazole, polyethylene glycol, quetiapine, and trazodone. He  reports that he quit smoking about 11 years ago. He has quit using smokeless tobacco. He reports that he does not currently use alcohol. He reports that he does not use drugs. [name] is allergic to trileptal [oxcarbazepine].   HPI  Today, he is being contacted for medication management.  No change in medical history since last visit.  Patient's pain is at baseline.  Patient continues multimodal pain regimen as prescribed.  States that it provides pain relief and improvement in functional status. Takes hydrocodone very seldomly, 1 tablet daily as needed.  Last prescription was filled on 01/27/2020.  Pharmacotherapy Assessment   01/27/2020  01/19/2020   1  Hydrocodone-Acetamin 10-325 Mg 120.00  30  Bi Lat  [id number]   Wal (0854)  0/0  40.00 MME  Medicare  [place]     Analgesic: Hydrocodone 10 mg tablet daily as needed  Monitoring: [place] PMP: PDMP reviewed during this encounter.       Pharmacotherapy: No side-effects or adverse reactions reported. Compliance: No problems identified. Effectiveness: Clinically  acceptable. Plan: Refer to "POC". UDS:  Summary  Date Value Ref Range Status  07/21/2019 Note  Final    Comment:    ==================================================================== Compliance Drug Analysis, Ur ==================================================================== Test                             Result       Flag       Units Drug Present and Declared for Prescription Verification   Tramadol                       1940         EXPECTED   ng/mg creat   O-Desmethyltramadol            >5435        EXPECTED   ng/mg creat   N-Desmethyltramadol            775          EXPECTED   ng/mg creat    Source of tramadol is a prescription medication. O-desmethyltramadol    and N-desmethyltramadol are expected metabolites of tramadol.   Gabapentin                     PRESENT      EXPECTED Drug Present not Declared for Prescription Verification   Acetaminophen                  PRESENT      UNEXPECTED Drug Absent but Declared for Prescription Verification   Trazodone                      Not Detected UNEXPECTED   Lidocaine                      Not Detected UNEXPECTED    Lidocaine, as indicated in the declared medication list, is not    always detected even when used as directed.   Metoprolol  Not Detected UNEXPECTED ==================================================================== Test                      Result    Flag   Units      Ref Range   Creatinine              92               mg/dL      >=20 ==================================================================== Declared Medications:  The flagging and interpretation on this report are based on the  following declared medications.  Unexpected results may arise from  inaccuracies in the declared medications.  **Note: The testing scope of this panel includes these medications:  Gabapentin  Metoprolol  Tramadol  Trazodone  **Note: The testing scope of this panel does not include small to  moderate  amounts of these reported medications:  Lidocaine  **Note: The testing scope of this panel does not include the  following reported medications:  Amlodipine  Furosemide  Levothyroxine  Pantoprazole  Simvastatin ==================================================================== For clinical consultation, please call [phone]. ====================================================================      Laboratory Chemistry Profile   Renal Lab Results  Component Value Date   BUN 12 05/18/2020   CREATININE 1.40 (H) 05/18/2020   BCR 13 12/16/2018   GFRAA 54 (L) 12/16/2018   GFRNONAA 52 (L) 05/18/2020    Hepatic Lab Results  Component Value Date   AST 27 05/18/2020   ALT 25 05/18/2020   ALBUMIN 3.2 (L) 05/18/2020   ALKPHOS 66 05/18/2020   LIPASE 15 05/18/2020    Electrolytes Lab Results  Component Value Date   NA 139 05/18/2020   K 3.5 05/18/2020   CL 107 05/18/2020   CALCIUM 8.7 (L) 05/18/2020    Bone Lab Results  Component Value Date   VD25OH 35.5 12/16/2018    Inflammation (CRP: Acute Phase) (ESR: Chronic Phase) Lab Results  Component Value Date   CRP 4 12/16/2018   ESRSEDRATE 3 12/16/2018   LATICACIDVEN 1.6 05/18/2020         Note: Above Lab results reviewed.  Imaging  DG Chest 2 View CLINICAL DATA:  Fever.  Hypotension.  Question aspiration.  EXAM: CHEST - 2 VIEW  COMPARISON:  09/07/2019  FINDINGS: Heart and mediastinal shadows are normal. There is patchy bronchopneumonia in the lingula and left perihilar lung. No dense consolidation, collapse or effusion. No acute bone finding.  IMPRESSION: Patchy bronchopneumonia in the lingula and left perihilar lung. No dense consolidation or collapse.  Electronically Signed   By: Nelson Chimes M.D.   On: 05/18/2020 10:03  Assessment  The primary encounter diagnosis was Small fiber neuropathy. Diagnoses of Cervical radicular pain, Degenerative disc disease, cervical, Whiplash injury syndrome,  sequela, Reactive depression, and Chronic pain due to trauma were also pertinent to this visit.  Plan of Care    [name] has a current medication list which includes the following long-term medication(s): famotidine, furosemide, metoprolol succinate, pantoprazole, and quetiapine.  Pharmacotherapy (Medications Ordered): Meds ordered this encounter  Medications   HYDROcodone-acetaminophen (NORCO) 10-325 MG tablet    Sig: Take 1 tablet by mouth every 6 (six) hours as needed for severe pain. Must last 30 days.    Dispense:  120 tablet    Refill:  0    Chronic Pain: STOP Act (Not applicable) Fill 1 day early if closed on refill date. Avoid benzodiazepines within 8 hours of opioids    Orders:  Orders Placed This Encounter  Procedures   ToxASSURE Select 13 (MW), Urine    Volume: 30 ml(s). Minimum 3 ml of urine is needed. Document temperature of fresh sample. Indications: Long term (current) use of opiate analgesic (Z18.209)    Order Specific Question:   Release to patient    Answer:   Immediate    Follow-up plan:   Return for patient will call to schedule her F2F med refill.     Failed oxycodone, hydrocodone, tramadol, buprenorphine, [place].       Recent Visits No visits were found meeting these conditions. Showing recent visits within past 90 days and meeting all other requirements Today's Visits Date Type Provider Dept  04/11/21 Office Visit Gillis Santa, MD [facility]  Showing today's visits and meeting all other requirements Future Appointments No visits were found meeting these conditions. Showing future appointments within next 90 days and meeting all other requirements I discussed the assessment and treatment plan with the patient. The patient was provided an opportunity to ask questions and all were answered. The patient agreed with the plan and demonstrated an understanding of the instructions.  Patient advised to call back or seek an  in-person evaluation if the symptoms or condition worsens.  Duration of encounter: 42mnutes.  Note by: BGillis Santa MD Date: 04/11/2021; Time: 1:49 PM

## 2021-04-17 ENCOUNTER — Telehealth: Payer: Self-pay | Admitting: Student in an Organized Health Care Education/Training Program

## 2021-04-17 MED ORDER — HYDROCODONE-ACETAMINOPHEN 10-325 MG PO TABS: 1.0000 | ORAL_TABLET | Freq: Four times a day (QID) | ORAL | 0 refills | Status: AC | PRN

## 2021-04-17 NOTE — Telephone Encounter (Signed)
Called and cancelled hydro - apap 10-325 at [facility] in [place].

## 2021-04-17 NOTE — Telephone Encounter (Signed)
Patient called and said that she is looking for hydrocodone - apap 10 - 325 mg.  I told her that had been sent in to [facility] in [place].  She states that is the problem.  [facility] [address] [facility] [place]  I told her I am not sure where the confusion happened but I would ask Dr Holley Raring if he would please resend to the correct pharmacy.

## 2021-04-20 DIAGNOSIS — A419 Sepsis, unspecified organism: Secondary | ICD-10-CM | POA: Diagnosis not present

## 2021-04-20 DIAGNOSIS — R6520 Severe sepsis without septic shock: Secondary | ICD-10-CM | POA: Diagnosis not present

## 2021-04-20 DIAGNOSIS — R918 Other nonspecific abnormal finding of lung field: Secondary | ICD-10-CM | POA: Diagnosis not present

## 2021-04-20 DIAGNOSIS — R42 Dizziness and giddiness: Secondary | ICD-10-CM | POA: Diagnosis not present

## 2021-04-20 DIAGNOSIS — J181 Lobar pneumonia, unspecified organism: Secondary | ICD-10-CM | POA: Diagnosis not present

## 2021-04-20 DIAGNOSIS — J9601 Acute respiratory failure with hypoxia: Secondary | ICD-10-CM | POA: Diagnosis not present

## 2021-04-20 DIAGNOSIS — R110 Nausea: Secondary | ICD-10-CM | POA: Diagnosis not present

## 2021-04-21 DIAGNOSIS — R6520 Severe sepsis without septic shock: Secondary | ICD-10-CM | POA: Diagnosis present

## 2021-04-21 DIAGNOSIS — J439 Emphysema, unspecified: Secondary | ICD-10-CM | POA: Diagnosis not present

## 2021-04-21 DIAGNOSIS — F319 Bipolar disorder, unspecified: Secondary | ICD-10-CM | POA: Diagnosis present

## 2021-04-21 DIAGNOSIS — I700 Atherosclerosis of aorta: Secondary | ICD-10-CM | POA: Diagnosis not present

## 2021-04-21 DIAGNOSIS — R6521 Severe sepsis with septic shock: Secondary | ICD-10-CM | POA: Diagnosis not present

## 2021-04-21 DIAGNOSIS — J9811 Atelectasis: Secondary | ICD-10-CM | POA: Diagnosis not present

## 2021-04-21 DIAGNOSIS — Z8701 Personal history of pneumonia (recurrent): Secondary | ICD-10-CM | POA: Diagnosis not present

## 2021-04-21 DIAGNOSIS — B964 Proteus (mirabilis) (morganii) as the cause of diseases classified elsewhere: Secondary | ICD-10-CM | POA: Diagnosis present

## 2021-04-21 DIAGNOSIS — F418 Other specified anxiety disorders: Secondary | ICD-10-CM | POA: Diagnosis present

## 2021-04-21 DIAGNOSIS — E875 Hyperkalemia: Secondary | ICD-10-CM | POA: Diagnosis present

## 2021-04-21 DIAGNOSIS — K219 Gastro-esophageal reflux disease without esophagitis: Secondary | ICD-10-CM | POA: Diagnosis present

## 2021-04-21 DIAGNOSIS — A4151 Sepsis due to Escherichia coli [E. coli]: Secondary | ICD-10-CM | POA: Diagnosis present

## 2021-04-21 DIAGNOSIS — J90 Pleural effusion, not elsewhere classified: Secondary | ICD-10-CM | POA: Diagnosis not present

## 2021-04-21 DIAGNOSIS — I4891 Unspecified atrial fibrillation: Secondary | ICD-10-CM | POA: Diagnosis present

## 2021-04-21 DIAGNOSIS — J690 Pneumonitis due to inhalation of food and vomit: Secondary | ICD-10-CM | POA: Diagnosis present

## 2021-04-21 DIAGNOSIS — R410 Disorientation, unspecified: Secondary | ICD-10-CM | POA: Diagnosis not present

## 2021-04-21 DIAGNOSIS — I10 Essential (primary) hypertension: Secondary | ICD-10-CM | POA: Diagnosis present

## 2021-04-21 DIAGNOSIS — J9601 Acute respiratory failure with hypoxia: Secondary | ICD-10-CM | POA: Diagnosis present

## 2021-04-21 DIAGNOSIS — R4182 Altered mental status, unspecified: Secondary | ICD-10-CM | POA: Diagnosis not present

## 2021-04-21 DIAGNOSIS — K110 Atrophy of salivary gland: Secondary | ICD-10-CM | POA: Diagnosis not present

## 2021-04-21 DIAGNOSIS — E039 Hypothyroidism, unspecified: Secondary | ICD-10-CM | POA: Diagnosis present

## 2021-04-21 DIAGNOSIS — J189 Pneumonia, unspecified organism: Secondary | ICD-10-CM | POA: Diagnosis not present

## 2021-04-21 DIAGNOSIS — R1310 Dysphagia, unspecified: Secondary | ICD-10-CM | POA: Diagnosis present

## 2021-04-21 DIAGNOSIS — A419 Sepsis, unspecified organism: Secondary | ICD-10-CM | POA: Diagnosis not present

## 2021-04-21 DIAGNOSIS — M1990 Unspecified osteoarthritis, unspecified site: Secondary | ICD-10-CM | POA: Diagnosis present

## 2021-04-21 DIAGNOSIS — F0390 Unspecified dementia without behavioral disturbance: Secondary | ICD-10-CM | POA: Diagnosis present

## 2021-04-21 DIAGNOSIS — Z9981 Dependence on supplemental oxygen: Secondary | ICD-10-CM | POA: Diagnosis not present

## 2021-04-21 DIAGNOSIS — E860 Dehydration: Secondary | ICD-10-CM | POA: Diagnosis present

## 2021-04-21 DIAGNOSIS — N179 Acute kidney failure, unspecified: Secondary | ICD-10-CM | POA: Diagnosis present

## 2021-04-21 DIAGNOSIS — Z79899 Other long term (current) drug therapy: Secondary | ICD-10-CM | POA: Diagnosis not present

## 2021-04-21 DIAGNOSIS — J9690 Respiratory failure, unspecified, unspecified whether with hypoxia or hypercapnia: Secondary | ICD-10-CM | POA: Diagnosis not present

## 2021-04-21 DIAGNOSIS — I517 Cardiomegaly: Secondary | ICD-10-CM | POA: Diagnosis not present

## 2021-04-21 DIAGNOSIS — Q430 Meckel's diverticulum (displaced) (hypertrophic): Secondary | ICD-10-CM | POA: Diagnosis not present

## 2021-04-21 DIAGNOSIS — G9341 Metabolic encephalopathy: Secondary | ICD-10-CM | POA: Diagnosis present

## 2021-04-21 DIAGNOSIS — R0600 Dyspnea, unspecified: Secondary | ICD-10-CM | POA: Diagnosis not present

## 2021-04-21 DIAGNOSIS — I4892 Unspecified atrial flutter: Secondary | ICD-10-CM | POA: Diagnosis not present

## 2021-04-21 DIAGNOSIS — Z85819 Personal history of malignant neoplasm of unspecified site of lip, oral cavity, and pharynx: Secondary | ICD-10-CM | POA: Diagnosis not present

## 2021-04-21 DIAGNOSIS — N390 Urinary tract infection, site not specified: Secondary | ICD-10-CM | POA: Diagnosis present

## 2021-04-21 DIAGNOSIS — R079 Chest pain, unspecified: Secondary | ICD-10-CM | POA: Diagnosis not present

## 2021-04-21 DIAGNOSIS — I350 Nonrheumatic aortic (valve) stenosis: Secondary | ICD-10-CM | POA: Diagnosis not present

## 2021-04-22 DIAGNOSIS — I350 Nonrheumatic aortic (valve) stenosis: Secondary | ICD-10-CM

## 2021-04-25 DIAGNOSIS — I4892 Unspecified atrial flutter: Secondary | ICD-10-CM

## 2021-05-11 DIAGNOSIS — I4891 Unspecified atrial fibrillation: Secondary | ICD-10-CM | POA: Diagnosis not present

## 2021-05-11 DIAGNOSIS — Z6831 Body mass index (BMI) 31.0-31.9, adult: Secondary | ICD-10-CM | POA: Diagnosis not present

## 2021-05-11 DIAGNOSIS — Z9981 Dependence on supplemental oxygen: Secondary | ICD-10-CM | POA: Diagnosis not present

## 2021-05-12 DIAGNOSIS — F0283 Dementia in other diseases classified elsewhere, unspecified severity, with mood disturbance: Secondary | ICD-10-CM | POA: Diagnosis not present

## 2021-05-12 DIAGNOSIS — G9341 Metabolic encephalopathy: Secondary | ICD-10-CM | POA: Diagnosis not present

## 2021-05-12 DIAGNOSIS — J9601 Acute respiratory failure with hypoxia: Secondary | ICD-10-CM | POA: Diagnosis not present

## 2021-05-12 DIAGNOSIS — J45909 Unspecified asthma, uncomplicated: Secondary | ICD-10-CM | POA: Diagnosis not present

## 2021-05-12 DIAGNOSIS — I10 Essential (primary) hypertension: Secondary | ICD-10-CM | POA: Diagnosis not present

## 2021-05-12 DIAGNOSIS — Z7901 Long term (current) use of anticoagulants: Secondary | ICD-10-CM | POA: Diagnosis not present

## 2021-05-12 DIAGNOSIS — F0284 Dementia in other diseases classified elsewhere, unspecified severity, with anxiety: Secondary | ICD-10-CM | POA: Diagnosis not present

## 2021-05-12 DIAGNOSIS — Z9981 Dependence on supplemental oxygen: Secondary | ICD-10-CM | POA: Diagnosis not present

## 2021-05-12 DIAGNOSIS — N390 Urinary tract infection, site not specified: Secondary | ICD-10-CM | POA: Diagnosis not present

## 2021-05-12 DIAGNOSIS — Z85818 Personal history of malignant neoplasm of other sites of lip, oral cavity, and pharynx: Secondary | ICD-10-CM | POA: Diagnosis not present

## 2021-05-12 DIAGNOSIS — N179 Acute kidney failure, unspecified: Secondary | ICD-10-CM | POA: Diagnosis not present

## 2021-05-12 DIAGNOSIS — K219 Gastro-esophageal reflux disease without esophagitis: Secondary | ICD-10-CM | POA: Diagnosis not present

## 2021-05-12 DIAGNOSIS — A419 Sepsis, unspecified organism: Secondary | ICD-10-CM | POA: Diagnosis not present

## 2021-05-12 DIAGNOSIS — R1310 Dysphagia, unspecified: Secondary | ICD-10-CM | POA: Diagnosis not present

## 2021-05-12 DIAGNOSIS — I480 Paroxysmal atrial fibrillation: Secondary | ICD-10-CM | POA: Diagnosis not present

## 2021-05-12 DIAGNOSIS — E039 Hypothyroidism, unspecified: Secondary | ICD-10-CM | POA: Diagnosis not present

## 2021-05-12 DIAGNOSIS — J189 Pneumonia, unspecified organism: Secondary | ICD-10-CM | POA: Diagnosis not present

## 2021-05-12 DIAGNOSIS — B9620 Unspecified Escherichia coli [E. coli] as the cause of diseases classified elsewhere: Secondary | ICD-10-CM | POA: Diagnosis not present

## 2021-05-12 DIAGNOSIS — F319 Bipolar disorder, unspecified: Secondary | ICD-10-CM | POA: Diagnosis not present

## 2021-05-16 DIAGNOSIS — N179 Acute kidney failure, unspecified: Secondary | ICD-10-CM | POA: Diagnosis not present

## 2021-05-16 DIAGNOSIS — J45909 Unspecified asthma, uncomplicated: Secondary | ICD-10-CM | POA: Diagnosis not present

## 2021-05-16 DIAGNOSIS — J189 Pneumonia, unspecified organism: Secondary | ICD-10-CM | POA: Diagnosis not present

## 2021-05-16 DIAGNOSIS — B9620 Unspecified Escherichia coli [E. coli] as the cause of diseases classified elsewhere: Secondary | ICD-10-CM | POA: Diagnosis not present

## 2021-05-16 DIAGNOSIS — N390 Urinary tract infection, site not specified: Secondary | ICD-10-CM | POA: Diagnosis not present

## 2021-05-16 DIAGNOSIS — J9601 Acute respiratory failure with hypoxia: Secondary | ICD-10-CM | POA: Diagnosis not present

## 2021-05-17 DIAGNOSIS — J45909 Unspecified asthma, uncomplicated: Secondary | ICD-10-CM | POA: Diagnosis not present

## 2021-05-17 DIAGNOSIS — N179 Acute kidney failure, unspecified: Secondary | ICD-10-CM | POA: Diagnosis not present

## 2021-05-17 DIAGNOSIS — J9601 Acute respiratory failure with hypoxia: Secondary | ICD-10-CM | POA: Diagnosis not present

## 2021-05-17 DIAGNOSIS — J189 Pneumonia, unspecified organism: Secondary | ICD-10-CM | POA: Diagnosis not present

## 2021-05-17 DIAGNOSIS — N390 Urinary tract infection, site not specified: Secondary | ICD-10-CM | POA: Diagnosis not present

## 2021-05-17 DIAGNOSIS — B9620 Unspecified Escherichia coli [E. coli] as the cause of diseases classified elsewhere: Secondary | ICD-10-CM | POA: Diagnosis not present

## 2021-05-18 DIAGNOSIS — J9601 Acute respiratory failure with hypoxia: Secondary | ICD-10-CM | POA: Diagnosis not present

## 2021-05-18 DIAGNOSIS — N390 Urinary tract infection, site not specified: Secondary | ICD-10-CM | POA: Diagnosis not present

## 2021-05-18 DIAGNOSIS — J45909 Unspecified asthma, uncomplicated: Secondary | ICD-10-CM | POA: Diagnosis not present

## 2021-05-18 DIAGNOSIS — J189 Pneumonia, unspecified organism: Secondary | ICD-10-CM | POA: Diagnosis not present

## 2021-05-18 DIAGNOSIS — N179 Acute kidney failure, unspecified: Secondary | ICD-10-CM | POA: Diagnosis not present

## 2021-05-18 DIAGNOSIS — B9620 Unspecified Escherichia coli [E. coli] as the cause of diseases classified elsewhere: Secondary | ICD-10-CM | POA: Diagnosis not present

## 2021-05-20 DIAGNOSIS — J189 Pneumonia, unspecified organism: Secondary | ICD-10-CM | POA: Diagnosis not present

## 2021-05-20 DIAGNOSIS — J45909 Unspecified asthma, uncomplicated: Secondary | ICD-10-CM | POA: Diagnosis not present

## 2021-05-20 DIAGNOSIS — N390 Urinary tract infection, site not specified: Secondary | ICD-10-CM | POA: Diagnosis not present

## 2021-05-20 DIAGNOSIS — J9601 Acute respiratory failure with hypoxia: Secondary | ICD-10-CM | POA: Diagnosis not present

## 2021-05-20 DIAGNOSIS — N179 Acute kidney failure, unspecified: Secondary | ICD-10-CM | POA: Diagnosis not present

## 2021-05-20 DIAGNOSIS — B9620 Unspecified Escherichia coli [E. coli] as the cause of diseases classified elsewhere: Secondary | ICD-10-CM | POA: Diagnosis not present

## 2021-05-22 DIAGNOSIS — N390 Urinary tract infection, site not specified: Secondary | ICD-10-CM | POA: Diagnosis not present

## 2021-05-22 DIAGNOSIS — N179 Acute kidney failure, unspecified: Secondary | ICD-10-CM | POA: Diagnosis not present

## 2021-05-22 DIAGNOSIS — J9601 Acute respiratory failure with hypoxia: Secondary | ICD-10-CM | POA: Diagnosis not present

## 2021-05-22 DIAGNOSIS — B9620 Unspecified Escherichia coli [E. coli] as the cause of diseases classified elsewhere: Secondary | ICD-10-CM | POA: Diagnosis not present

## 2021-05-22 DIAGNOSIS — R0600 Dyspnea, unspecified: Secondary | ICD-10-CM | POA: Diagnosis not present

## 2021-05-22 DIAGNOSIS — J45909 Unspecified asthma, uncomplicated: Secondary | ICD-10-CM | POA: Diagnosis not present

## 2021-05-22 DIAGNOSIS — J189 Pneumonia, unspecified organism: Secondary | ICD-10-CM | POA: Diagnosis not present

## 2021-05-22 DIAGNOSIS — Z8701 Personal history of pneumonia (recurrent): Secondary | ICD-10-CM | POA: Diagnosis not present

## 2021-05-25 DIAGNOSIS — J449 Chronic obstructive pulmonary disease, unspecified: Secondary | ICD-10-CM | POA: Diagnosis not present

## 2021-05-25 DIAGNOSIS — J4540 Moderate persistent asthma, uncomplicated: Secondary | ICD-10-CM | POA: Diagnosis not present

## 2021-05-25 DIAGNOSIS — J4551 Severe persistent asthma with (acute) exacerbation: Secondary | ICD-10-CM | POA: Diagnosis not present

## 2021-05-26 DIAGNOSIS — B9620 Unspecified Escherichia coli [E. coli] as the cause of diseases classified elsewhere: Secondary | ICD-10-CM | POA: Diagnosis not present

## 2021-05-26 DIAGNOSIS — N179 Acute kidney failure, unspecified: Secondary | ICD-10-CM | POA: Diagnosis not present

## 2021-05-26 DIAGNOSIS — J9601 Acute respiratory failure with hypoxia: Secondary | ICD-10-CM | POA: Diagnosis not present

## 2021-05-26 DIAGNOSIS — J189 Pneumonia, unspecified organism: Secondary | ICD-10-CM | POA: Diagnosis not present

## 2021-05-26 DIAGNOSIS — N390 Urinary tract infection, site not specified: Secondary | ICD-10-CM | POA: Diagnosis not present

## 2021-05-26 DIAGNOSIS — J45909 Unspecified asthma, uncomplicated: Secondary | ICD-10-CM | POA: Diagnosis not present

## 2021-05-29 DIAGNOSIS — J45909 Unspecified asthma, uncomplicated: Secondary | ICD-10-CM | POA: Diagnosis not present

## 2021-05-29 DIAGNOSIS — B9620 Unspecified Escherichia coli [E. coli] as the cause of diseases classified elsewhere: Secondary | ICD-10-CM | POA: Diagnosis not present

## 2021-05-29 DIAGNOSIS — J9601 Acute respiratory failure with hypoxia: Secondary | ICD-10-CM | POA: Diagnosis not present

## 2021-05-29 DIAGNOSIS — J189 Pneumonia, unspecified organism: Secondary | ICD-10-CM | POA: Diagnosis not present

## 2021-05-29 DIAGNOSIS — N179 Acute kidney failure, unspecified: Secondary | ICD-10-CM | POA: Diagnosis not present

## 2021-05-29 DIAGNOSIS — N390 Urinary tract infection, site not specified: Secondary | ICD-10-CM | POA: Diagnosis not present

## 2021-05-31 DIAGNOSIS — B9620 Unspecified Escherichia coli [E. coli] as the cause of diseases classified elsewhere: Secondary | ICD-10-CM | POA: Diagnosis not present

## 2021-05-31 DIAGNOSIS — J45909 Unspecified asthma, uncomplicated: Secondary | ICD-10-CM | POA: Diagnosis not present

## 2021-05-31 DIAGNOSIS — N179 Acute kidney failure, unspecified: Secondary | ICD-10-CM | POA: Diagnosis not present

## 2021-05-31 DIAGNOSIS — N390 Urinary tract infection, site not specified: Secondary | ICD-10-CM | POA: Diagnosis not present

## 2021-05-31 DIAGNOSIS — J9601 Acute respiratory failure with hypoxia: Secondary | ICD-10-CM | POA: Diagnosis not present

## 2021-05-31 DIAGNOSIS — J189 Pneumonia, unspecified organism: Secondary | ICD-10-CM | POA: Diagnosis not present

## 2021-06-01 IMAGING — RF DG ESOPHAGUS
8 of 9 series · 17 of 24 positions shown · non-contrast
Comparison: Chest CT of 12/14/2017 from [facility].

CLINICAL DATA: Esophageal dysphagia.  Odynophagia.

EXAM:
ESOPHOGRAM / BARIUM SWALLOW / BARIUM TABLET STUDY
TECHNIQUE: Combined double contrast and single contrast examination performed
using effervescent crystals, thick barium liquid, and thin barium
liquid. The patient was observed with fluoroscopy swallowing a 13 mm
barium sulphate tablet.
FLUOROSCOPY TIME:  Fluoroscopy Time:  2 minutes and 12 seconds
Radiation Exposure Index (if provided by the fluoroscopic device):
27.8
Number of Acquired Spot Images: 0

[Series 1: cp_standard · 0.34mm/px · 2 of 117 frames shown (1 of 8)]
[frame 3/117]
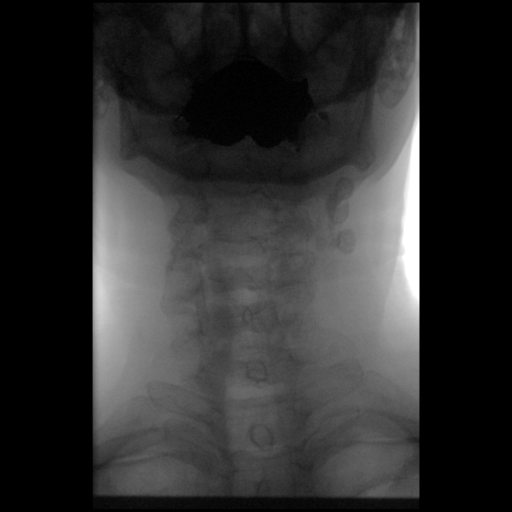
[frame 100/117]
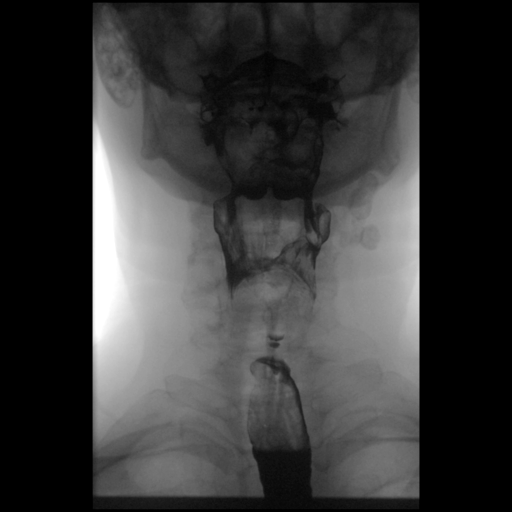

[Series 2: cp_standard · 0.34mm/px · 2 of 106 frames shown (2 of 8)]
[frame 14/106]
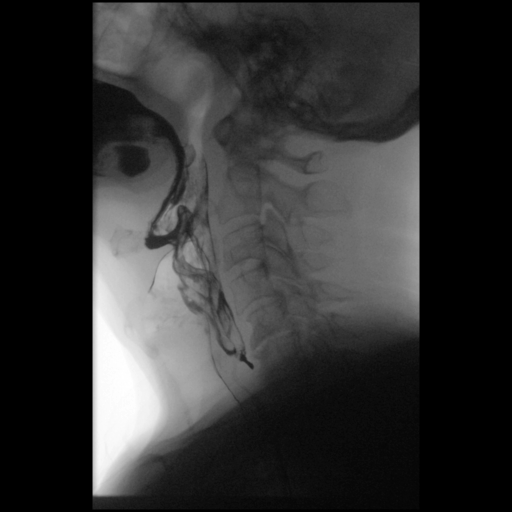
[frame 16/106]
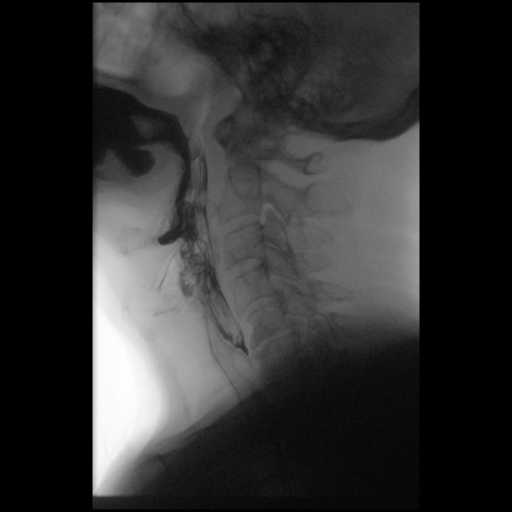

[Series 3: cp_standard · 0.52mm/px · 3 of 133 frames shown (3 of 8)]
[frame 20/133]
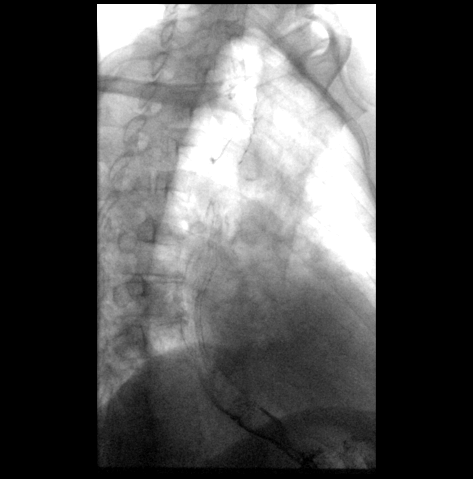
[frame 21/133]
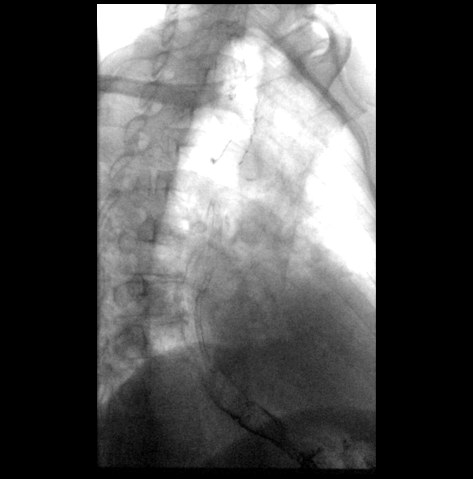
[frame 114/133]
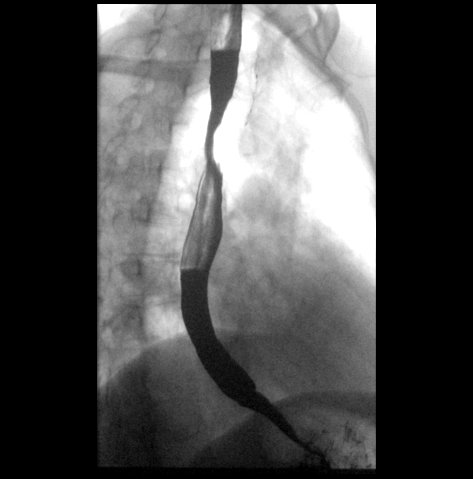

[Series 4: cp_standard · 0.52mm/px · 2 of 229 frames shown (4 of 8)]
[frame 115/229]
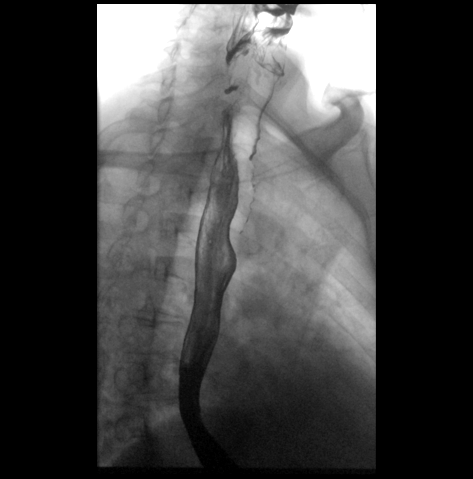
[frame 196/229]
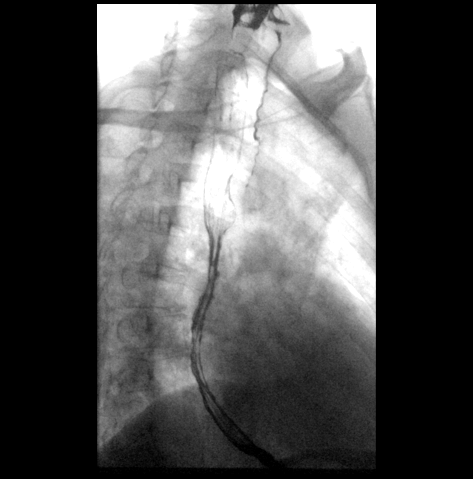

[Series 5: cp_standard · 0.52mm/px · 2 of 151 frames shown (5 of 8)]
[frame 23/151]
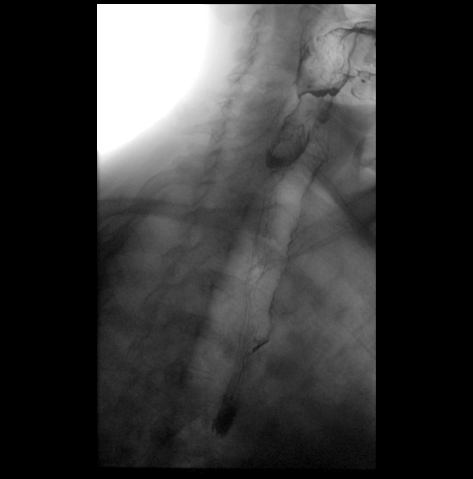
[frame 111/151]
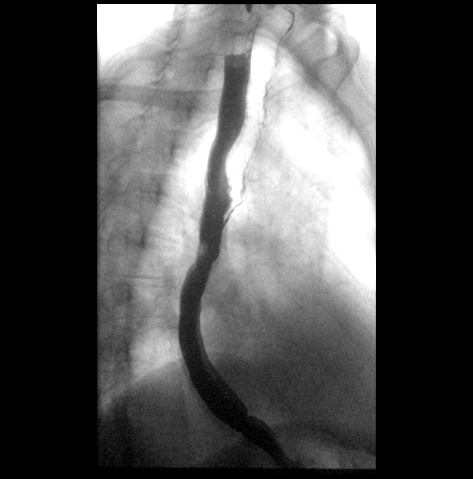

[Series 6: cp_standard · 0.52mm/px · 2 of 178 frames shown (6 of 8)]
[frame 27/178]
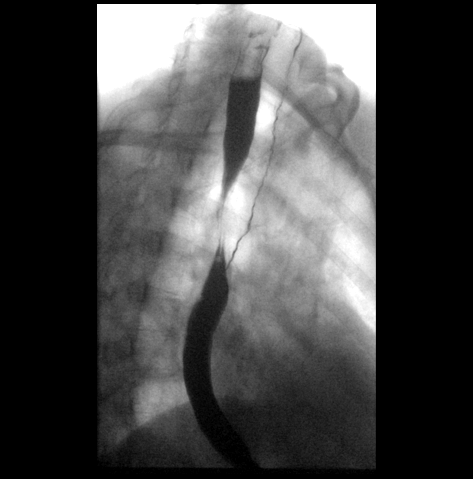
[frame 28/178]
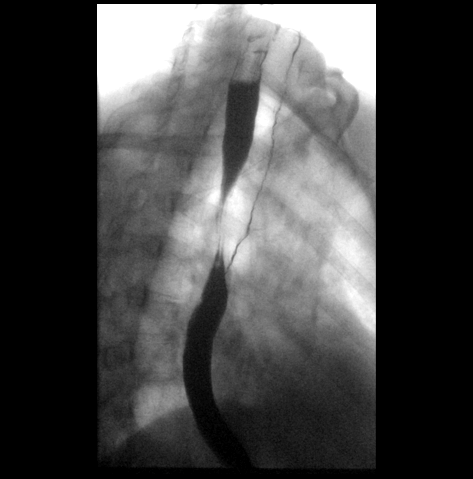

[Series 7: cp_standard · 0.52mm/px · 3 of 229 frames shown (7 of 8)]
[frame 29/229]
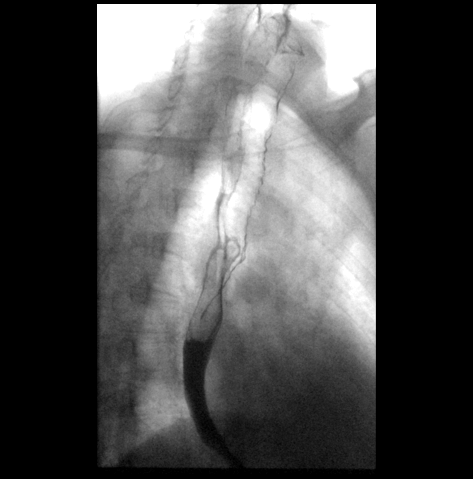
[frame 35/229]
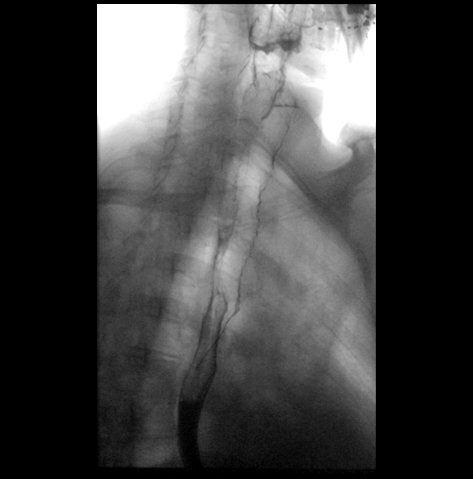
[frame 115/229]
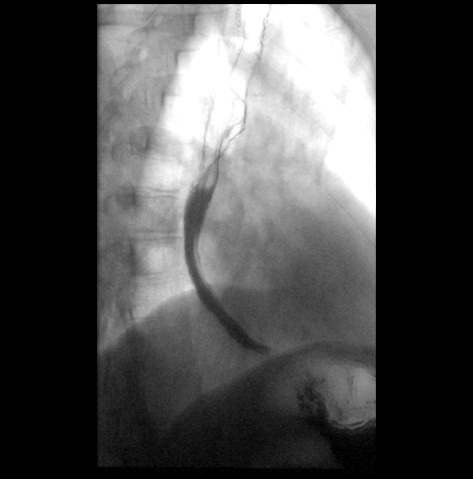

[Series 9: cp_standard · 0.26mm/px · 1 of 1 slices shown (8 of 8)]
[im 1/1]
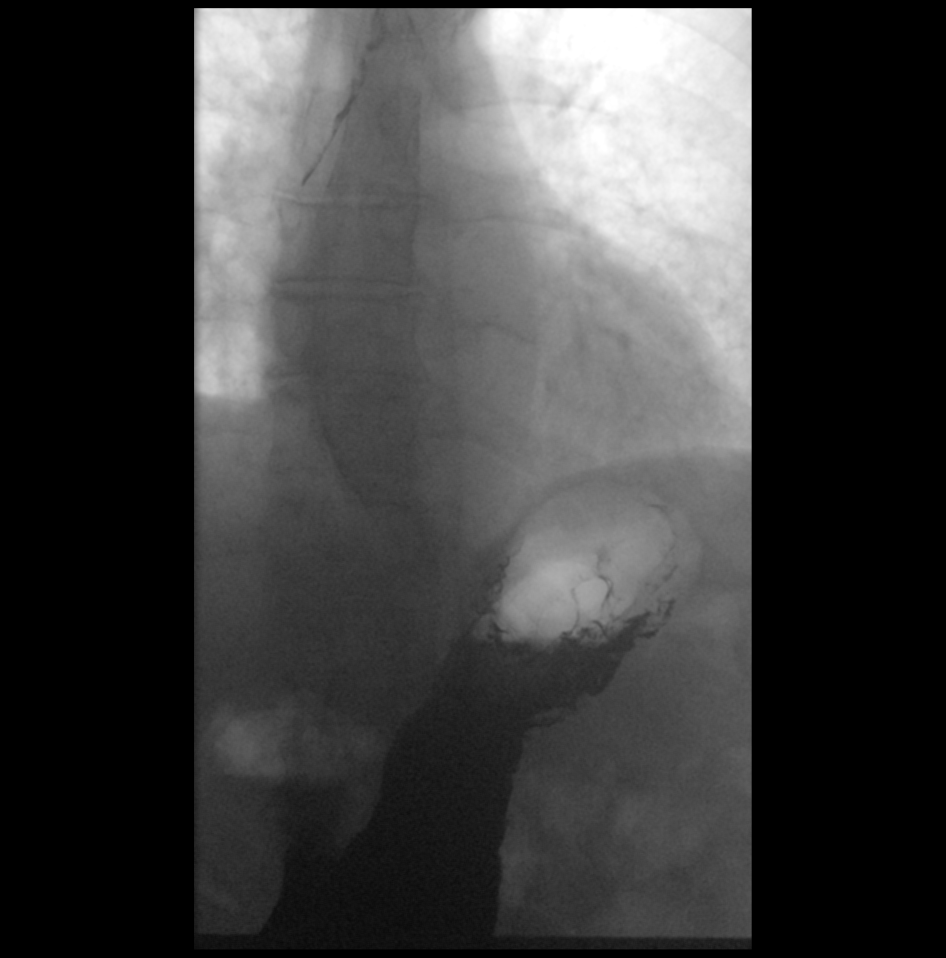

[17 of 24 positions shown; findings below may reference images not displayed]

FINDINGS: Evaluation of the hypopharynx demonstrates a prominent
cricopharyngeus muscle and a pre cricoid impression. Rudi Jumper
diverticulum.

Double contrast evaluation of the esophagus demonstrates no mucosal
abnormality.

During the double contrast portion exam, it became apparent that the
patient was having silent aspiration. Therefore, decision was made
to not lay patient prone or evaluate primary peristalsis. Full
column evaluation was performed with the patient upright.

Given this mild limitation, no evidence of esophageal stricture
identified. No hiatal hernia.

A 13 mm barium tablet passes promptly.
IMPRESSION: 1. Silent aspiration with successive swallows during double-contrast
portion of exam.
2. Secondary to this fact, the study was performed entirely upright
and esophageal peristalsis was not evaluated.
3. Given this limitation, no evidence of esophageal stricture or
anatomic cause for patient's symptoms identified.
4. Rudi Jumper diverticulum.
5. Consider speech pathology evaluation.

These results will be called to the ordering clinician or
representative by the Radiologist Assistant, and communication
documented in the PACS or zVision Dashboard.

## 2021-06-06 DIAGNOSIS — A419 Sepsis, unspecified organism: Secondary | ICD-10-CM | POA: Diagnosis not present

## 2021-06-06 DIAGNOSIS — J189 Pneumonia, unspecified organism: Secondary | ICD-10-CM | POA: Diagnosis not present

## 2021-06-07 DIAGNOSIS — N390 Urinary tract infection, site not specified: Secondary | ICD-10-CM | POA: Diagnosis not present

## 2021-06-07 DIAGNOSIS — R918 Other nonspecific abnormal finding of lung field: Secondary | ICD-10-CM | POA: Diagnosis not present

## 2021-06-07 DIAGNOSIS — J189 Pneumonia, unspecified organism: Secondary | ICD-10-CM | POA: Diagnosis not present

## 2021-06-07 DIAGNOSIS — Z8701 Personal history of pneumonia (recurrent): Secondary | ICD-10-CM | POA: Diagnosis not present

## 2021-06-07 DIAGNOSIS — B9620 Unspecified Escherichia coli [E. coli] as the cause of diseases classified elsewhere: Secondary | ICD-10-CM | POA: Diagnosis not present

## 2021-06-07 DIAGNOSIS — J9601 Acute respiratory failure with hypoxia: Secondary | ICD-10-CM | POA: Diagnosis not present

## 2021-06-07 DIAGNOSIS — J45909 Unspecified asthma, uncomplicated: Secondary | ICD-10-CM | POA: Diagnosis not present

## 2021-06-07 DIAGNOSIS — N179 Acute kidney failure, unspecified: Secondary | ICD-10-CM | POA: Diagnosis not present

## 2021-06-09 ENCOUNTER — Ambulatory Visit: Payer: Medicare Other

## 2021-06-09 ENCOUNTER — Telehealth: Payer: Self-pay | Admitting: Cardiology

## 2021-06-09 DIAGNOSIS — J45909 Unspecified asthma, uncomplicated: Secondary | ICD-10-CM | POA: Diagnosis not present

## 2021-06-09 DIAGNOSIS — N179 Acute kidney failure, unspecified: Secondary | ICD-10-CM | POA: Diagnosis not present

## 2021-06-09 DIAGNOSIS — J189 Pneumonia, unspecified organism: Secondary | ICD-10-CM | POA: Diagnosis not present

## 2021-06-09 DIAGNOSIS — J9601 Acute respiratory failure with hypoxia: Secondary | ICD-10-CM | POA: Diagnosis not present

## 2021-06-09 DIAGNOSIS — N390 Urinary tract infection, site not specified: Secondary | ICD-10-CM | POA: Diagnosis not present

## 2021-06-09 DIAGNOSIS — B9620 Unspecified Escherichia coli [E. coli] as the cause of diseases classified elsewhere: Secondary | ICD-10-CM | POA: Diagnosis not present

## 2021-06-09 MED ORDER — ELIQUIS 5 MG PO TABS: 5.0000 mg | ORAL_TABLET | Freq: Two times a day (BID) | ORAL | 0 refills | Status: DC

## 2021-06-09 MED ORDER — METOPROLOL TARTRATE 100 MG PO TABS: 100.0000 mg | ORAL_TABLET | Freq: Two times a day (BID) | ORAL | 0 refills | Status: DC

## 2021-06-09 NOTE — Telephone Encounter (Signed)
Spoke with pt's wife and an appointment has been made. Refills sent in for Eliquis and Metoprolol. Pt will come Tuesday for a nurse visit for an EKG as he has QT prolongation and requires a EKG before refill is sent. Pt's wife is aware.

## 2021-06-09 NOTE — Telephone Encounter (Signed)
The medications were prescribed while the pt was in [facility]. How do you advise as we have not seen the pt since 5/22.

## 2021-06-09 NOTE — Telephone Encounter (Signed)
°*  STAT* If patient is at the pharmacy, call can be transferred to refill team.   1. Which medications need to be refilled? (please list name of each medication and dose if known)  Amiodarone 200 mg  once daily Eliquis 5 mg twice daily Metoprolol 100 mg twice daily  2. Which pharmacy/location (including street and city if local pharmacy) is medication to be sent to? [id number] [place] [place] [address]  3. Do they need a 30 day or 90 day supply? 72 with refills   Patient's wife said that these medications were prescribed for the patient when he was in [facility]. The patient is having a hard time getting the medications refilled.   The patient is out of medication

## 2021-06-09 NOTE — Addendum Note (Signed)
Addended by: Truddie Hidden on: 06/09/2021 03:49 PM   Modules accepted: Orders

## 2021-06-11 DIAGNOSIS — B9620 Unspecified Escherichia coli [E. coli] as the cause of diseases classified elsewhere: Secondary | ICD-10-CM | POA: Diagnosis not present

## 2021-06-11 DIAGNOSIS — Z9981 Dependence on supplemental oxygen: Secondary | ICD-10-CM | POA: Diagnosis not present

## 2021-06-11 DIAGNOSIS — Z85818 Personal history of malignant neoplasm of other sites of lip, oral cavity, and pharynx: Secondary | ICD-10-CM | POA: Diagnosis not present

## 2021-06-11 DIAGNOSIS — Z7901 Long term (current) use of anticoagulants: Secondary | ICD-10-CM | POA: Diagnosis not present

## 2021-06-11 DIAGNOSIS — F0283 Dementia in other diseases classified elsewhere, unspecified severity, with mood disturbance: Secondary | ICD-10-CM | POA: Diagnosis not present

## 2021-06-11 DIAGNOSIS — J45909 Unspecified asthma, uncomplicated: Secondary | ICD-10-CM | POA: Diagnosis not present

## 2021-06-11 DIAGNOSIS — R1310 Dysphagia, unspecified: Secondary | ICD-10-CM | POA: Diagnosis not present

## 2021-06-11 DIAGNOSIS — F319 Bipolar disorder, unspecified: Secondary | ICD-10-CM | POA: Diagnosis not present

## 2021-06-11 DIAGNOSIS — F0284 Dementia in other diseases classified elsewhere, unspecified severity, with anxiety: Secondary | ICD-10-CM | POA: Diagnosis not present

## 2021-06-11 DIAGNOSIS — A419 Sepsis, unspecified organism: Secondary | ICD-10-CM | POA: Diagnosis not present

## 2021-06-11 DIAGNOSIS — J189 Pneumonia, unspecified organism: Secondary | ICD-10-CM | POA: Diagnosis not present

## 2021-06-11 DIAGNOSIS — I480 Paroxysmal atrial fibrillation: Secondary | ICD-10-CM | POA: Diagnosis not present

## 2021-06-11 DIAGNOSIS — E039 Hypothyroidism, unspecified: Secondary | ICD-10-CM | POA: Diagnosis not present

## 2021-06-11 DIAGNOSIS — I10 Essential (primary) hypertension: Secondary | ICD-10-CM | POA: Diagnosis not present

## 2021-06-11 DIAGNOSIS — G9341 Metabolic encephalopathy: Secondary | ICD-10-CM | POA: Diagnosis not present

## 2021-06-11 DIAGNOSIS — N390 Urinary tract infection, site not specified: Secondary | ICD-10-CM | POA: Diagnosis not present

## 2021-06-11 DIAGNOSIS — N179 Acute kidney failure, unspecified: Secondary | ICD-10-CM | POA: Diagnosis not present

## 2021-06-11 DIAGNOSIS — K219 Gastro-esophageal reflux disease without esophagitis: Secondary | ICD-10-CM | POA: Diagnosis not present

## 2021-06-11 DIAGNOSIS — J9601 Acute respiratory failure with hypoxia: Secondary | ICD-10-CM | POA: Diagnosis not present

## 2021-06-13 ENCOUNTER — Ambulatory Visit (INDEPENDENT_AMBULATORY_CARE_PROVIDER_SITE_OTHER): Payer: Medicare Other | Admitting: Emergency Medicine

## 2021-06-13 ENCOUNTER — Other Ambulatory Visit: Payer: Self-pay

## 2021-06-13 VITALS — BP 120/70 | HR 51 | Ht 68.0 in | Wt 209.0 lb

## 2021-06-13 DIAGNOSIS — I4891 Unspecified atrial fibrillation: Secondary | ICD-10-CM | POA: Diagnosis not present

## 2021-06-13 DIAGNOSIS — J9601 Acute respiratory failure with hypoxia: Secondary | ICD-10-CM | POA: Diagnosis not present

## 2021-06-13 DIAGNOSIS — J45909 Unspecified asthma, uncomplicated: Secondary | ICD-10-CM | POA: Diagnosis not present

## 2021-06-13 DIAGNOSIS — N390 Urinary tract infection, site not specified: Secondary | ICD-10-CM | POA: Diagnosis not present

## 2021-06-13 DIAGNOSIS — B9620 Unspecified Escherichia coli [E. coli] as the cause of diseases classified elsewhere: Secondary | ICD-10-CM | POA: Diagnosis not present

## 2021-06-13 DIAGNOSIS — E7800 Pure hypercholesterolemia, unspecified: Secondary | ICD-10-CM

## 2021-06-13 DIAGNOSIS — J189 Pneumonia, unspecified organism: Secondary | ICD-10-CM | POA: Diagnosis not present

## 2021-06-13 DIAGNOSIS — N179 Acute kidney failure, unspecified: Secondary | ICD-10-CM | POA: Diagnosis not present

## 2021-06-13 MED ORDER — AMIODARONE HCL 200 MG PO TABS: 200.0000 mg | ORAL_TABLET | Freq: Every day | ORAL | 1 refills | Status: DC

## 2021-06-13 NOTE — Progress Notes (Signed)
Reason for visit: Needed amiodarone refilled.  Name of MD requesting visit: Dr. Geraldo Pitter   H&P: On amiodarone therapy, needed refilled, but needed ekg first per Dr. Geraldo Pitter.  ROS related to problem: Need ekg for medication refill.   Assessment and plan per MD: Per Dr. Agustin Cree patient will start back amiodarone 200 mg daily (he was previously on 200 mg twice daily). Will let Dr. Geraldo Pitter know as well.

## 2021-06-13 NOTE — Patient Instructions (Signed)
Medication Instructions:  Your physician has recommended you make the following change in your medication:  Take: Amiodarone 200 mg daily   *If you need a refill on your cardiac medications before your next appointment, please call your pharmacy*   Lab Work: None If you have labs (blood work) drawn today and your tests are completely normal, you will receive your results only by: [place] (if you have MyChart) OR A paper copy in the mail If you have any lab test that is abnormal or we need to change your treatment, we will call you to review the results.   Testing/Procedures: None   Follow-Up: At [facility], you and your health needs are our priority.  As part of our continuing mission to provide you with exceptional heart care, we have created designated Provider Care Teams.  These Care Teams include your primary Cardiologist (physician) and Advanced Practice Providers (APPs -  Physician Assistants and Nurse Practitioners) who all work together to provide you with the care you need, when you need it.  We recommend signing up for the patient portal called "MyChart".  Sign up information is provided on this After Visit Summary.  MyChart is used to connect with patients for Virtual Visits (Telemedicine).  Patients are able to view lab/test results, encounter notes, upcoming appointments, etc.  Non-urgent messages can be sent to your provider as well.   To learn more about what you can do with MyChart, go to [email or web address].    Your next appointment:   Follow up as scheduled.

## 2021-06-14 NOTE — Progress Notes (Signed)
Spoke with patient wife per dpr. Informed her that he does not need to start amiodarone back at this time per Dr. Geraldo Pitter. She states this worked out well because she was not able to pick the medication up from the pharmacy yet. Patient will hold off on amiodarone and revisit the idea with Dr. Geraldo Pitter at his upcoming appointment 06/19/21. Patient informed Dr. Geraldo Pitter would like him to come in for blood work. They will come to tomorrow. No further questions.

## 2021-06-14 NOTE — Addendum Note (Signed)
Addended by: Senaida Ores on: 06/14/2021 02:41 PM   Modules accepted: Orders

## 2021-06-15 DIAGNOSIS — J45909 Unspecified asthma, uncomplicated: Secondary | ICD-10-CM | POA: Diagnosis not present

## 2021-06-15 DIAGNOSIS — N179 Acute kidney failure, unspecified: Secondary | ICD-10-CM | POA: Diagnosis not present

## 2021-06-15 DIAGNOSIS — B9620 Unspecified Escherichia coli [E. coli] as the cause of diseases classified elsewhere: Secondary | ICD-10-CM | POA: Diagnosis not present

## 2021-06-15 DIAGNOSIS — J189 Pneumonia, unspecified organism: Secondary | ICD-10-CM | POA: Diagnosis not present

## 2021-06-15 DIAGNOSIS — J9601 Acute respiratory failure with hypoxia: Secondary | ICD-10-CM | POA: Diagnosis not present

## 2021-06-15 DIAGNOSIS — N390 Urinary tract infection, site not specified: Secondary | ICD-10-CM | POA: Diagnosis not present

## 2021-06-16 DIAGNOSIS — I4891 Unspecified atrial fibrillation: Secondary | ICD-10-CM | POA: Diagnosis not present

## 2021-06-16 DIAGNOSIS — E7800 Pure hypercholesterolemia, unspecified: Secondary | ICD-10-CM | POA: Diagnosis not present

## 2021-06-17 LAB — BASIC METABOLIC PANEL
BUN/Creatinine Ratio: 10 (ref 10–24)
BUN: 17 mg/dL (ref 8–27)
CO2: 27 mmol/L (ref 20–29)
Calcium: 9.2 mg/dL (ref 8.6–10.2)
Chloride: 105 mmol/L (ref 96–106)
Creatinine, Ser: 1.66 mg/dL — ABNORMAL HIGH (ref 0.76–1.27)
Glucose: 89 mg/dL (ref 70–99)
Potassium: 4.5 mmol/L (ref 3.5–5.2)
Sodium: 143 mmol/L (ref 134–144)
eGFR: 42 mL/min/{1.73_m2} — ABNORMAL LOW (ref >59)

## 2021-06-17 LAB — HEPATIC FUNCTION PANEL
ALT: 10 IU/L (ref 0–44)
AST: 16 IU/L (ref 0–40)
Albumin: 4.1 g/dL (ref 3.7–4.7)
Alkaline Phosphatase: 72 IU/L (ref 44–121)
Bilirubin Total: 0.3 mg/dL (ref 0.0–1.2)
Bilirubin, Direct: 0.1 mg/dL (ref 0.00–0.40)
Total Protein: 5.9 g/dL — ABNORMAL LOW (ref 6.0–8.5)

## 2021-06-17 LAB — LIPID PANEL
Chol/HDL Ratio: 4.4 ratio (ref 0.0–5.0)
Cholesterol, Total: 222 mg/dL — ABNORMAL HIGH (ref 100–199)
HDL: 51 mg/dL (ref >39)
LDL Chol Calc (NIH): 137 mg/dL — ABNORMAL HIGH (ref 0–99)
Triglycerides: 190 mg/dL — ABNORMAL HIGH (ref 0–149)
VLDL Cholesterol Cal: 34 mg/dL (ref 5–40)

## 2021-06-17 LAB — TSH: TSH: 3.87 u[IU]/mL (ref 0.450–4.500)

## 2021-06-18 ENCOUNTER — Other Ambulatory Visit: Payer: Self-pay | Admitting: Cardiology

## 2021-06-19 ENCOUNTER — Other Ambulatory Visit: Payer: Self-pay

## 2021-06-19 ENCOUNTER — Telehealth: Payer: Self-pay

## 2021-06-19 ENCOUNTER — Ambulatory Visit (INDEPENDENT_AMBULATORY_CARE_PROVIDER_SITE_OTHER): Payer: Medicare Other | Admitting: Cardiology

## 2021-06-19 ENCOUNTER — Encounter: Payer: Self-pay | Admitting: Cardiology

## 2021-06-19 VITALS — BP 150/70 | HR 50 | Ht 68.0 in | Wt 209.0 lb

## 2021-06-19 DIAGNOSIS — N289 Disorder of kidney and ureter, unspecified: Secondary | ICD-10-CM | POA: Diagnosis not present

## 2021-06-19 DIAGNOSIS — E7800 Pure hypercholesterolemia, unspecified: Secondary | ICD-10-CM | POA: Diagnosis not present

## 2021-06-19 DIAGNOSIS — I4891 Unspecified atrial fibrillation: Secondary | ICD-10-CM | POA: Diagnosis not present

## 2021-06-19 DIAGNOSIS — I350 Nonrheumatic aortic (valve) stenosis: Secondary | ICD-10-CM | POA: Diagnosis not present

## 2021-06-19 DIAGNOSIS — I10 Essential (primary) hypertension: Secondary | ICD-10-CM | POA: Diagnosis not present

## 2021-06-19 MED ORDER — ROSUVASTATIN CALCIUM 10 MG PO TABS: 10.0000 mg | ORAL_TABLET | Freq: Every day | ORAL | 3 refills | Status: DC

## 2021-06-19 NOTE — Progress Notes (Signed)
Cardiology Office Note:    Date:  06/19/2021   ID:  [name], DOB 02-Dec-1940, MRN [id number]  PCP:  [name], MD  Cardiologist:  Jenean Lindau, MD   Referring MD: [name], MD    ASSESSMENT:    1. Atrial fibrillation, unspecified type ([place])   2. Essential hypertension   3. Hypercholesteremia   4. Mild aortic stenosis   5. Renal insufficiency    PLAN:    In order of problems listed above:  Coronary artery calcification: Secondary prevention stressed with the patient.  Importance of compliance with diet medication stressed and he vocalized understanding.  He was advised to walk on a regular basis to the best of his ability. Mixed dyslipidemia: Recent lipid work was evaluated and initiated on Crestor 10 mg daily.  Liver lipid check in 1 month at follow-up visit.  He will come fasting.  Benefits and potential risks of statin explained and he vocalized understanding and questions were answered to his satisfaction. Essential hypertension: Blood pressure stable and lifestyle modification urged.  His blood pressure is fine at home.  He has an element of whitecoat hypertension.  Salt intake issues were discussed. Paroxysmal atrial fibrillation:I discussed with the patient atrial fibrillation, disease process. Management and therapy including rate and rhythm control, anticoagulation benefits and potential risks were discussed extensively with the patient. Patient had multiple questions which were answered to patient's satisfaction. Patient will be seen in follow-up appointment in 6 months or earlier if the patient has any concerns    Medication Adjustments/Labs and Tests Ordered: Current medicines are reviewed at length with the patient today.  Concerns regarding medicines are outlined above.  No orders of the defined types were placed in this encounter.  No orders of the defined types were placed in this encounter.    Chief Complaint  Patient presents with    Follow-up     History of Present Illness:    [name] is a 81 y.o. male.  Patient has past medical history of coronary atherosclerosis, paroxysmal atrial fibrillation, essential hypertension dyslipidemia and renal insufficiency.  I reviewed records from [facility].  He had significant hypotension and septic shock he was treated and discharged and is doing fine.  No chest pain orthopnea or PND.  At the time of my evaluation, the patient is alert awake oriented and in no distress.  Past Medical History:  Diagnosis Date   Acquired hypothyroidism 08/17/2014   Acute MI, true posterior wall, subsequent episode of care ([place]) 08/15/2014   EF 44% with mildly reduced LV function  Formatting of this note might be different from the original. EF 44% with mildly reduced LV function   AKI (acute kidney injury) ([place]) 09/20/2014   Anxiety    Atrial fibrillation ([place]) 09/28/2019   ablation in past  Formatting of this note might be different from the original. ablation in past   Bipolar disorder [facility])    w/dementia/psychotic episodes   Bradycardia 09/17/2014   Cancer ([place]) 09/28/2019   Prostate Cancer; Throat Cancer  Formatting of this note might be different from the original. Prostate Cancer; Throat Cancer   Cardiac murmur 09/28/2019   Cataract    Cervical radicular pain 07/16/2019   Cervical spondylosis 06/19/2019   Chest pain with high risk for cardiac etiology 08/19/2014   Chronic depression    Chronic pain due to trauma 07/16/2019   Corn of toe 12/11/2018   DDD (degenerative disc disease), cervical 09/29/2018  Degenerative lumbar disc    Dementia without behavioral disturbance ([place]) 07/16/2019   Disease characterized by destruction of skeletal muscle 08/15/2014   Essential hypertension 09/28/2019   Essential tremor 09/21/2014   History of MI (myocardial infarction) 09/14/2014   Hypercholesteremia    Hypertension    Hypotension 09/14/2014   Malignant neoplasm of prostate ([place])    Mild aortic  stenosis 11/26/2019   Mood change 10/12/2019   Myocardial infarction ([place]) 08/10/2014   Nausea and vomiting 08/19/2014   Onychomycosis of left great toe 08/21/2014   Pain of fifth toe 12/11/2018   Peptic esophagitis    Peripheral neuropathy    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   Prolonged Q-T interval on ECG 08/19/2014   Prostate cancer ([place]) 09/28/2019   Reactive depression 10/12/2019   Renal insufficiency 08/17/2014   Right bundle branch block 09/28/2019   Sepsis ([place]) 05/18/2020   Small fiber neuropathy 12/16/2018   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 Pancolonic diverticulosis predominantly in the sigmoid colon. Internal hemorrhoids.    ESOPHAGOGASTRODUODENOSCOPY  06/15/2010   Erosive esophagitis. Status post PEG placment   EYE SURGERY     INGUINAL HERNIA REPAIR     PROSTATE BIOPSY     SEBACEOUS CYST REMOVAL     TONSILLECTOMY      Current Medications: Current Meds  Medication Sig   albuterol (PROVENTIL) (2.5 MG/3ML) 0.083% nebulizer solution Inhale 3 mLs into the lungs every 6 (six) hours as needed for shortness of breath or wheezing.   amiodarone (PACERONE) 200 MG tablet Take 1 tablet (200 mg total) by mouth daily.   amLODipine (NORVASC) 10 MG tablet Take 10 mg by mouth daily.   busPIRone (BUSPAR) 10 MG tablet Take 10 mg by mouth as needed for anxiety.   cloNIDine (CATAPRES) 0.1 MG tablet Take 0.1 mg by mouth daily.   Docusate Sodium (STOOL SOFTENER LAXATIVE PO) Take 1 tablet by mouth as needed for constipation.   ELIQUIS 5 MG TABS tablet Take 1 tablet (5 mg total) by mouth 2 (two) times daily.   famotidine (PEPCID) 40 MG tablet Take 40  mg by mouth daily.   furosemide (LASIX) 20 MG tablet Take 20 mg by mouth daily as needed for edema or fluid.   ibuprofen (ADVIL) 200 MG tablet Take 200 mg by mouth every 6 (six) hours as needed for pain.   levothyroxine (SYNTHROID) 125 MCG tablet Take 125 mcg by mouth daily.   LORazepam (ATIVAN) 1 MG tablet Take 1 mg by mouth 3 (three) times daily.   metoprolol tartrate (LOPRESSOR) 100 MG tablet TAKE 1 TABLET BY MOUTH TWICE A DAY   oxybutynin (DITROPAN-XL) 10 MG 24 hr tablet Take 10 mg by mouth daily.   pantoprazole (PROTONIX) 40 MG tablet TAKE 1 TABLET BY MOUTH TWICE A DAY   polyethylene glycol (MIRALAX / GLYCOLAX) 17 g packet Take 17 g by mouth daily as needed for constipation.   predniSONE (DELTASONE) 20 MG tablet Take 20 mg by mouth daily.   QUEtiapine (SEROQUEL) 400 MG tablet Take 1 tablet by mouth every evening.   traZODone (DESYREL) 50 MG tablet Take 50-100 mg  by mouth as needed for sleep.     Allergies:   Trileptal [oxcarbazepine]   Social History   Socioeconomic History   Marital status: Married    Spouse name: Not on file   Number of children: 3   Years of education: 10th grade   Highest education level: Not on file  Occupational History   Occupation: Retired  Tobacco Use   Smoking status: Former    Types: Cigarettes    Quit date: 2011    Years since quitting: 12.0   Smokeless tobacco: Former  Scientific laboratory technician Use: Never used  Substance and Sexual Activity   Alcohol use: Not Currently   Drug use: Never   Sexual activity: Not on file  Other Topics Concern   Not on file  Social History Narrative   Lives at home with wife.   Right-handed.   2 cups of caffeine per day.   Social Determinants of Health   Financial Resource Strain: Not on file  Food Insecurity: Not on file  Transportation Needs: Not on file  Physical Activity: Not on file  Stress: Not on file  Social Connections: Not on file     Family History: The patient's family history includes Heart  disease in his father; Other in his mother; Prostate cancer in his father; Stomach cancer in his father. There is no history of Colon cancer, Esophageal cancer, or Rectal cancer.  ROS:   Please see the history of present illness.    All other systems reviewed and are negative.  EKGs/Labs/Other Studies Reviewed:    The following studies were reviewed today: EKG reveals sinus bradycardia nonspecific ST-T changes   Recent Labs: 06/16/2021: ALT 10; BUN 17; Creatinine, Ser 1.66; Potassium 4.5; Sodium 143; TSH 3.870  Recent Lipid Panel    Component Value Date/Time   CHOL 222 (H) 06/16/2021 0810   TRIG 190 (H) 06/16/2021 0810   HDL 51 06/16/2021 0810   CHOLHDL 4.4 06/16/2021 0810   LDLCALC 137 (H) 06/16/2021 0810    Physical Exam:    VS:  BP (!) 150/70 (BP Location: Right Arm, Patient Position: Sitting, Cuff Size: Normal)    Pulse (!) 50    Ht 5\' 8"  (1.727 m)    Wt 209 lb (94.8 kg)    SpO2 90%    BMI 31.78 kg/m     Wt Readings from Last 3 Encounters:  06/19/21 209 lb (94.8 kg)  06/13/21 209 lb (94.8 kg)  11/03/20 208 lb 9.6 oz (94.6 kg)     GEN: Patient is in no acute distress HEENT: Normal NECK: No JVD; No carotid bruits LYMPHATICS: No lymphadenopathy CARDIAC: Hear sounds regular, 2/6 systolic murmur at the apex. RESPIRATORY:  Clear to auscultation without rales, wheezing or rhonchi  ABDOMEN: Soft, non-tender, non-distended MUSCULOSKELETAL:  No edema; No deformity  SKIN: Warm and dry NEUROLOGIC:  Alert and oriented x 3 PSYCHIATRIC:  Normal affect   Signed, Jenean Lindau, MD  06/19/2021 8:37 AM    [place]

## 2021-06-19 NOTE — Addendum Note (Signed)
Addended by: Resa Miner I on: 06/19/2021 08:49 AM   Modules accepted: Orders

## 2021-06-19 NOTE — Telephone Encounter (Signed)
-----   Message from Jenean Lindau, MD sent at 06/19/2021 11:13 AM EST ----- Diet, double statin and liver lipid check in 6 weeks. Jenean Lindau, MD 06/19/2021 11:12 AM

## 2021-06-19 NOTE — Telephone Encounter (Signed)
Spoke with patient regarding results and recommendation.  Patient verbalizes understanding and is agreeable to plan of care. Advised patient to call back with any issues or concerns.  

## 2021-06-19 NOTE — Patient Instructions (Addendum)
Medication Instructions:  Your physician has recommended you make the following change in your medication:  START: Crestor 10 mg take one tablet by mouth daily.   *If you need a refill on your cardiac medications before your next appointment, please call your pharmacy*   Lab Work: Your physician recommends that you return for lab work in: TODAY BMP, LFT, Lipids If you have labs (blood work) drawn today and your tests are completely normal, you will receive your results only by: [place] (if you have MyChart) OR A paper copy in the mail If you have any lab test that is abnormal or we need to change your treatment, we will call you to review the results.   Testing/Procedures: None   Follow-Up: At [facility], you and your health needs are our priority.  As part of our continuing mission to provide you with exceptional heart care, we have created designated Provider Care Teams.  These Care Teams include your primary Cardiologist (physician) and Advanced Practice Providers (APPs -  Physician Assistants and Nurse Practitioners) who all work together to provide you with the care you need, when you need it.  We recommend signing up for the patient portal called "MyChart".  Sign up information is provided on this After Visit Summary.  MyChart is used to connect with patients for Virtual Visits (Telemedicine).  Patients are able to view lab/test results, encounter notes, upcoming appointments, etc.  Non-urgent messages can be sent to your provider as well.   To learn more about what you can do with MyChart, go to [email or web address].    Your next appointment:   Already scheduled  The format for your next appointment:   In Person  Provider:   Jyl Heinz, MD    Other Instructions

## 2021-06-20 DIAGNOSIS — R911 Solitary pulmonary nodule: Secondary | ICD-10-CM | POA: Diagnosis not present

## 2021-06-20 DIAGNOSIS — R918 Other nonspecific abnormal finding of lung field: Secondary | ICD-10-CM | POA: Diagnosis not present

## 2021-06-20 DIAGNOSIS — I700 Atherosclerosis of aorta: Secondary | ICD-10-CM | POA: Diagnosis not present

## 2021-06-21 DIAGNOSIS — J45909 Unspecified asthma, uncomplicated: Secondary | ICD-10-CM | POA: Diagnosis not present

## 2021-06-21 DIAGNOSIS — N390 Urinary tract infection, site not specified: Secondary | ICD-10-CM | POA: Diagnosis not present

## 2021-06-21 DIAGNOSIS — N179 Acute kidney failure, unspecified: Secondary | ICD-10-CM | POA: Diagnosis not present

## 2021-06-21 DIAGNOSIS — J189 Pneumonia, unspecified organism: Secondary | ICD-10-CM | POA: Diagnosis not present

## 2021-06-21 DIAGNOSIS — B9620 Unspecified Escherichia coli [E. coli] as the cause of diseases classified elsewhere: Secondary | ICD-10-CM | POA: Diagnosis not present

## 2021-06-21 DIAGNOSIS — J9601 Acute respiratory failure with hypoxia: Secondary | ICD-10-CM | POA: Diagnosis not present

## 2021-06-26 DIAGNOSIS — H6092 Unspecified otitis externa, left ear: Secondary | ICD-10-CM | POA: Diagnosis not present

## 2021-06-27 DIAGNOSIS — N179 Acute kidney failure, unspecified: Secondary | ICD-10-CM | POA: Diagnosis not present

## 2021-06-27 DIAGNOSIS — J189 Pneumonia, unspecified organism: Secondary | ICD-10-CM | POA: Diagnosis not present

## 2021-06-27 DIAGNOSIS — J9601 Acute respiratory failure with hypoxia: Secondary | ICD-10-CM | POA: Diagnosis not present

## 2021-06-27 DIAGNOSIS — J45909 Unspecified asthma, uncomplicated: Secondary | ICD-10-CM | POA: Diagnosis not present

## 2021-06-27 DIAGNOSIS — N390 Urinary tract infection, site not specified: Secondary | ICD-10-CM | POA: Diagnosis not present

## 2021-06-27 DIAGNOSIS — B9620 Unspecified Escherichia coli [E. coli] as the cause of diseases classified elsewhere: Secondary | ICD-10-CM | POA: Diagnosis not present

## 2021-06-28 DIAGNOSIS — R918 Other nonspecific abnormal finding of lung field: Secondary | ICD-10-CM | POA: Diagnosis not present

## 2021-06-28 DIAGNOSIS — Z8701 Personal history of pneumonia (recurrent): Secondary | ICD-10-CM | POA: Diagnosis not present

## 2021-07-05 ENCOUNTER — Other Ambulatory Visit: Payer: Self-pay

## 2021-07-05 DIAGNOSIS — J189 Pneumonia, unspecified organism: Secondary | ICD-10-CM | POA: Diagnosis not present

## 2021-07-05 DIAGNOSIS — J9601 Acute respiratory failure with hypoxia: Secondary | ICD-10-CM | POA: Diagnosis not present

## 2021-07-05 DIAGNOSIS — N179 Acute kidney failure, unspecified: Secondary | ICD-10-CM | POA: Diagnosis not present

## 2021-07-05 DIAGNOSIS — B9620 Unspecified Escherichia coli [E. coli] as the cause of diseases classified elsewhere: Secondary | ICD-10-CM | POA: Diagnosis not present

## 2021-07-05 DIAGNOSIS — J45909 Unspecified asthma, uncomplicated: Secondary | ICD-10-CM | POA: Diagnosis not present

## 2021-07-05 DIAGNOSIS — N390 Urinary tract infection, site not specified: Secondary | ICD-10-CM | POA: Diagnosis not present

## 2021-07-05 MED ORDER — METOPROLOL TARTRATE 100 MG PO TABS: 100.0000 mg | ORAL_TABLET | Freq: Two times a day (BID) | ORAL | 3 refills | Status: DC

## 2021-07-08 ENCOUNTER — Other Ambulatory Visit: Payer: Self-pay | Admitting: Cardiology

## 2021-07-11 NOTE — Telephone Encounter (Signed)
Prescription refill request for Eliquis received. Indication: afib  Last office visit: [place], 06/19/2021 Scr: 1.66, 06/16/2021 Age: 81 yo  Weight: 94.8 kg

## 2021-07-17 ENCOUNTER — Other Ambulatory Visit: Payer: Self-pay

## 2021-07-17 ENCOUNTER — Telehealth: Payer: Self-pay

## 2021-07-17 MED ORDER — APIXABAN 5 MG PO TABS: 5.0000 mg | ORAL_TABLET | Freq: Two times a day (BID) | ORAL | 5 refills | Status: DC

## 2021-07-17 NOTE — Telephone Encounter (Signed)
Received a fax from [facility] in [place], [place] stating that the patient wanting an alternative treatment in place of Eliquis because it is $500 with current insurance.  I called and spoke with Leda Gauze his wife and POA per the [facility] and suggested that she come in and pick up patient assistance forms and fill them out to help offset the cost of the medication and we can give him some samples.  She thanked me for the help and will be coming by the office to pick up the paperwork and filling out there portion and bringing it back for [place] to finish and fax back to [facility].

## 2021-07-17 NOTE — Telephone Encounter (Signed)
Pt's wife calling requesting a generic for Eliquis be sent to pt's pharmacy [facility], because the medication Eliquis is too expensive, $573 and they would like for a generic to be sent into pharmacy, because it is cheaper. Please address

## 2021-07-25 ENCOUNTER — Encounter: Payer: Self-pay | Admitting: Cardiology

## 2021-07-25 ENCOUNTER — Ambulatory Visit (INDEPENDENT_AMBULATORY_CARE_PROVIDER_SITE_OTHER): Payer: Medicare Other | Admitting: Cardiology

## 2021-07-25 ENCOUNTER — Other Ambulatory Visit: Payer: Self-pay

## 2021-07-25 VITALS — BP 158/78 | HR 52 | Ht 68.0 in | Wt 212.8 lb

## 2021-07-25 DIAGNOSIS — E7800 Pure hypercholesterolemia, unspecified: Secondary | ICD-10-CM

## 2021-07-25 DIAGNOSIS — I2510 Atherosclerotic heart disease of native coronary artery without angina pectoris: Secondary | ICD-10-CM | POA: Insufficient documentation

## 2021-07-25 DIAGNOSIS — I10 Essential (primary) hypertension: Secondary | ICD-10-CM | POA: Diagnosis not present

## 2021-07-25 DIAGNOSIS — I480 Paroxysmal atrial fibrillation: Secondary | ICD-10-CM

## 2021-07-25 DIAGNOSIS — I2584 Coronary atherosclerosis due to calcified coronary lesion: Secondary | ICD-10-CM | POA: Diagnosis not present

## 2021-07-25 DIAGNOSIS — I350 Nonrheumatic aortic (valve) stenosis: Secondary | ICD-10-CM | POA: Diagnosis not present

## 2021-07-25 DIAGNOSIS — N289 Disorder of kidney and ureter, unspecified: Secondary | ICD-10-CM | POA: Diagnosis not present

## 2021-07-25 HISTORY — DX: Atherosclerotic heart disease of native coronary artery without angina pectoris: I25.10

## 2021-07-25 HISTORY — DX: Paroxysmal atrial fibrillation: I48.0

## 2021-07-25 MED ORDER — APIXABAN 5 MG PO TABS: 5.0000 mg | ORAL_TABLET | Freq: Two times a day (BID) | ORAL | 12 refills | Status: DC

## 2021-07-25 NOTE — Patient Instructions (Signed)
Medication Instructions:  Your physician recommends that you continue on your current medications as directed. Please refer to the Current Medication list given to you today.  *If you need a refill on your cardiac medications before your next appointment, please call your pharmacy*   Lab Work: Your physician recommends that you have labs done in the office today. Your test included  basic metabolic panel,  liver function and lipids.  If you have labs (blood work) drawn today and your tests are completely normal, you will receive your results only by: [place] (if you have MyChart) OR A paper copy in the mail If you have any lab test that is abnormal or we need to change your treatment, we will call you to review the results.   Testing/Procedures: None ordered   Follow-Up: At [facility], you and your health needs are our priority.  As part of our continuing mission to provide you with exceptional heart care, we have created designated Provider Care Teams.  These Care Teams include your primary Cardiologist (physician) and Advanced Practice Providers (APPs -  Physician Assistants and Nurse Practitioners) who all work together to provide you with the care you need, when you need it.  We recommend signing up for the patient portal called "MyChart".  Sign up information is provided on this After Visit Summary.  MyChart is used to connect with patients for Virtual Visits (Telemedicine).  Patients are able to view lab/test results, encounter notes, upcoming appointments, etc.  Non-urgent messages can be sent to your provider as well.   To learn more about what you can do with MyChart, go to [email or web address].    Your next appointment:   6 month(s)  The format for your next appointment:   In Person  Provider:   Jyl Heinz, MD   Other Instructions NA

## 2021-07-25 NOTE — Progress Notes (Signed)
Cardiology Office Note:    Date:  07/25/2021   ID:  [name], DOB 1941-01-09, MRN [id number]  PCP:  [name], MD  Cardiologist:  Jenean Lindau, MD   Referring MD: [name], MD    ASSESSMENT:    1. Essential hypertension   2. Mild aortic stenosis   3. Paroxysmal atrial fibrillation (HCC)   4. Hypercholesteremia   5. Renal insufficiency   6. Coronary artery calcification    PLAN:    In order of problems listed above:  Coronary artery calcification: Secondary prevention stressed with the patient.  Importance of compliance with diet and medication stressed and vocalized understanding.  He was advised to walk on a regular basis. Right bundle branch block and left anterior fascicular block: Patient on beta-blocker therapy.  He is tolerating it well.  He has no episodes of dizziness or any syncope or any such symptoms. Essential hypertension: Blood pressure stable and diet was emphasized.  Lifestyle modification urged.  His blood pressure at home is better and he has an element of whitecoat hypertension. Mixed dyslipidemia: Lipids were reviewed patient on statin therapy and will have blood work today.  Diet was emphasized. Paroxysmal atrial fibrillation:I discussed with the patient atrial fibrillation, disease process. Management and therapy including rate and rhythm control, anticoagulation benefits and potential risks were discussed extensively with the patient. Patient had multiple questions which were answered to patient's satisfaction.  Kidney [facility] was [facility].  History of renal insufficiency.  His Eliquis dose was reviewed and found to be fine. Patient will be seen in follow-up appointment in 6 months or earlier if the patient has any concerns    Medication Adjustments/Labs and Tests Ordered: Current medicines are reviewed at length with the patient today.  Concerns regarding medicines are outlined above.  Orders Placed This Encounter   Procedures   EKG 12-Lead   No orders of the defined types were placed in this encounter.    No chief complaint on file.    History of Present Illness:    [name] is a 81 y.o. male.  Patient has past medical history of paroxysmal atrial fibrillation, atrial hypertension, mixed dyslipidemia, coronary artery calcification and mild aortic stenosis.  He has renal insufficiency.  He denies any problems at this time and takes care of activities of daily living.  No chest pain orthopnea or PND.  At the time of my evaluation, the patient is alert awake oriented and in no distress.  Past Medical History:  Diagnosis Date   Acquired hypothyroidism 08/17/2014   Acute MI, true posterior wall, subsequent episode of care ([place]) 08/15/2014   EF 44% with mildly reduced LV function  Formatting of this note might be different from the original. EF 44% with mildly reduced LV function   AKI (acute kidney injury) ([place]) 09/20/2014   Anxiety    Atrial fibrillation ([place]) 09/28/2019   ablation in past  Formatting of this note might be different from the original. ablation in past   Bipolar disorder [facility])    w/dementia/psychotic episodes   Bradycardia 09/17/2014   Cancer ([place]) 09/28/2019   Prostate Cancer; Throat Cancer  Formatting of this note might be different from the original. Prostate Cancer; Throat Cancer   Cardiac murmur 09/28/2019   Cataract    Cervical radicular pain 07/16/2019   Cervical spondylosis 06/19/2019   Chest pain with high risk for cardiac etiology 08/19/2014   Chronic depression    Chronic  pain due to trauma 07/16/2019   [place] of toe 12/11/2018   DDD (degenerative disc disease), cervical 09/29/2018   Degenerative lumbar disc    Dementia without behavioral disturbance ([place]) 07/16/2019   Disease characterized by destruction of skeletal muscle 08/15/2014   Essential hypertension 09/28/2019   Essential tremor 09/21/2014   History of MI (myocardial infarction) 09/14/2014   Hypercholesteremia     Hypertension    Hypotension 09/14/2014   Malignant neoplasm of prostate ([place])    Mild aortic stenosis 11/26/2019   Mood change 10/12/2019   Myocardial infarction ([place]) 08/10/2014   Nausea and vomiting 08/19/2014   Onychomycosis of left great toe 08/21/2014   Pain of fifth toe 12/11/2018   Peptic esophagitis    Peripheral neuropathy    Peripheral neuropathy due to chemotherapy ([place]) 09/21/2014   Prolonged Q-T interval on ECG 08/19/2014   Prostate cancer ([place]) 09/28/2019   Reactive depression 10/12/2019   Renal insufficiency 08/17/2014   Right bundle branch block 09/28/2019   Sepsis ([place]) 05/18/2020   Small fiber neuropathy 12/16/2018   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 Pancolonic diverticulosis predominantly in the sigmoid colon. Internal hemorrhoids.    ESOPHAGOGASTRODUODENOSCOPY  06/15/2010   Erosive esophagitis. Status post PEG placment   EYE SURGERY     INGUINAL HERNIA REPAIR     PROSTATE BIOPSY     SEBACEOUS CYST REMOVAL     TONSILLECTOMY      Current Medications: Current Meds  Medication Sig   albuterol (PROVENTIL) (2.5 MG/3ML) 0.083% nebulizer solution Inhale 3 mLs into the lungs every 6 (six) hours as needed for shortness of breath or wheezing.   amLODipine (NORVASC) 10 MG tablet Take 10 mg by mouth daily.   apixaban (ELIQUIS) 5 MG TABS tablet Take 1 tablet (5 mg total) by mouth 2 (two) times daily.   cloNIDine (CATAPRES) 0.1 MG tablet Take 0.1 mg by mouth daily.   Docusate Sodium (STOOL SOFTENER LAXATIVE PO) Take 1 tablet by mouth as needed for constipation.   famotidine (PEPCID) 40 MG tablet Take 40 mg by mouth daily.   furosemide (LASIX) 20 MG tablet  Take 20 mg by mouth daily as needed for edema or fluid.   ibuprofen (ADVIL) 200 MG tablet Take 200 mg by mouth every 6 (six) hours as needed for pain.   levothyroxine (SYNTHROID) 125 MCG tablet Take 125 mcg by mouth daily.   LORazepam (ATIVAN) 1 MG tablet Take 1 mg by mouth 3 (three) times daily.   metoprolol tartrate (LOPRESSOR) 100 MG tablet Take 1 tablet (100 mg total) by mouth 2 (two) times daily.   oxybutynin (DITROPAN-XL) 10 MG 24 hr tablet Take 10 mg by mouth as needed (urinary retention).   pantoprazole (PROTONIX) 40 MG tablet TAKE 1 TABLET BY MOUTH TWICE A DAY   polyethylene glycol (MIRALAX / GLYCOLAX) 17 g packet Take 17 g by mouth daily as needed for constipation.   QUEtiapine (SEROQUEL) 400 MG tablet Take 1 tablet by mouth every evening.   rosuvastatin (CRESTOR) 10 MG tablet Take 1 tablet (10 mg total) by mouth daily.   traZODone (DESYREL) 50 MG tablet Take 50-100 mg  by mouth as needed for sleep.     Allergies:   Trileptal [oxcarbazepine]   Social History   Socioeconomic History   Marital status: Married    Spouse name: Not on file   Number of children: 3   Years of education: 10th grade   Highest education level: Not on file  Occupational History   Occupation: Retired  Tobacco Use   Smoking status: Former    Types: Cigarettes    Quit date: 2011    Years since quitting: 12.1   Smokeless tobacco: Former  Scientific laboratory technician Use: Never used  Substance and Sexual Activity   Alcohol use: Not Currently   Drug use: Never   Sexual activity: Not on file  Other Topics Concern   Not on file  Social History Narrative   Lives at home with wife.   Right-handed.   2 cups of caffeine per day.   Social Determinants of Health   Financial Resource Strain: Not on file  Food Insecurity: Not on file  Transportation Needs: Not on file  Physical Activity: Not on file  Stress: Not on file  Social Connections: Not on file     Family History: The patient's family history  includes Heart disease in his father; Other in his mother; Prostate cancer in his father; Stomach cancer in his father. There is no history of Colon cancer, Esophageal cancer, or Rectal cancer.  ROS:   Please see the history of present illness.    All other systems reviewed and are negative.  EKGs/Labs/Other Studies Reviewed:    The following studies were reviewed today: EKG reveals sinus rhythm, right bundle branch block and left anterior fascicular block and nonspecific ST-T changes   Recent Labs: 06/16/2021: ALT 10; BUN 17; Creatinine, Ser 1.66; Potassium 4.5; Sodium 143; TSH 3.870  Recent Lipid Panel    Component Value Date/Time   CHOL 222 (H) 06/16/2021 0810   TRIG 190 (H) 06/16/2021 0810   HDL 51 06/16/2021 0810   CHOLHDL 4.4 06/16/2021 0810   LDLCALC 137 (H) 06/16/2021 0810    Physical Exam:    VS:  BP (!) 158/78    Pulse (!) 52    Ht 5\' 8"  (1.727 m)    Wt 212 lb 12.8 oz (96.5 kg)    SpO2 96%    BMI 32.36 kg/m     Wt Readings from Last 3 Encounters:  07/25/21 212 lb 12.8 oz (96.5 kg)  06/19/21 209 lb (94.8 kg)  06/13/21 209 lb (94.8 kg)     GEN: Patient is in no acute distress HEENT: Normal NECK: No JVD; No carotid bruits LYMPHATICS: No lymphadenopathy CARDIAC: Hear sounds regular, 2/6 systolic murmur at the apex. RESPIRATORY:  Clear to auscultation without rales, wheezing or rhonchi  ABDOMEN: Soft, non-tender, non-distended MUSCULOSKELETAL:  No edema; No deformity  SKIN: Warm and dry NEUROLOGIC:  Alert and oriented x 3 PSYCHIATRIC:  Normal affect   Signed, Jenean Lindau, MD  07/25/2021 9:16 AM    [place]

## 2021-07-26 ENCOUNTER — Telehealth: Payer: Self-pay

## 2021-07-26 LAB — HEPATIC FUNCTION PANEL
ALT: 7 IU/L (ref 0–44)
AST: 16 IU/L (ref 0–40)
Albumin: 4.2 g/dL (ref 3.7–4.7)
Alkaline Phosphatase: 73 IU/L (ref 44–121)
Bilirubin Total: 0.2 mg/dL (ref 0.0–1.2)
Bilirubin, Direct: <0.1 mg/dL (ref 0.00–0.40)
Total Protein: 6.5 g/dL (ref 6.0–8.5)

## 2021-07-26 LAB — BASIC METABOLIC PANEL
BUN/Creatinine Ratio: 11 (ref 10–24)
BUN: 19 mg/dL (ref 8–27)
CO2: 27 mmol/L (ref 20–29)
Calcium: 9.5 mg/dL (ref 8.6–10.2)
Chloride: 104 mmol/L (ref 96–106)
Creatinine, Ser: 1.68 mg/dL — ABNORMAL HIGH (ref 0.76–1.27)
Glucose: 94 mg/dL (ref 70–99)
Potassium: 4.7 mmol/L (ref 3.5–5.2)
Sodium: 141 mmol/L (ref 134–144)
eGFR: 41 mL/min/{1.73_m2} — ABNORMAL LOW (ref >59)

## 2021-07-26 LAB — LIPID PANEL
Chol/HDL Ratio: 3.9 ratio (ref 0.0–5.0)
Cholesterol, Total: 173 mg/dL (ref 100–199)
HDL: 44 mg/dL (ref >39)
LDL Chol Calc (NIH): 86 mg/dL (ref 0–99)
Triglycerides: 261 mg/dL — ABNORMAL HIGH (ref 0–149)
VLDL Cholesterol Cal: 43 mg/dL — ABNORMAL HIGH (ref 5–40)

## 2021-07-26 NOTE — Telephone Encounter (Signed)
Left message on patients voicemail to please return our call.   

## 2021-07-26 NOTE — Telephone Encounter (Signed)
-----   Message from Jenean Lindau, MD sent at 07/26/2021  8:48 AM EST ----- Diet low in carb and fish oil 2gm bid. LL 3 months trigs are elevated cc pcp Jenean Lindau, MD 07/26/2021 8:47 AM

## 2021-07-26 NOTE — Telephone Encounter (Signed)
Spoke with patient regarding results and recommendation.  Patient verbalizes understanding and is agreeable to plan of care. Advised patient to call back with any issues or concerns.  

## 2021-07-27 DIAGNOSIS — K219 Gastro-esophageal reflux disease without esophagitis: Secondary | ICD-10-CM | POA: Diagnosis not present

## 2021-07-27 DIAGNOSIS — R1313 Dysphagia, pharyngeal phase: Secondary | ICD-10-CM | POA: Diagnosis not present

## 2021-08-03 ENCOUNTER — Telehealth: Payer: Self-pay | Admitting: Gastroenterology

## 2021-08-03 NOTE — Telephone Encounter (Signed)
Patients wife called requesting if it would be possible for an order to be placed for a swallowing test to be done in [place].

## 2021-08-03 NOTE — Telephone Encounter (Signed)
Pt wife Leda Gauze stated that she is requesting that a swallowing study to  be done in [place] so that the pt will not have to drive as far. Pt has been seeing Dr Alphonsa Gin ENT and Dr. Lindaann Pascal GI: Pt has NOT seen Dr. Lyndel Safe in Over a year. Leda Gauze was notified that typically our Dr's do not order text on pt's that they have not done an assessment on.  Leda Gauze verbalized understanding with all questions answered.

## 2021-08-14 DIAGNOSIS — R918 Other nonspecific abnormal finding of lung field: Secondary | ICD-10-CM | POA: Diagnosis not present

## 2021-08-14 DIAGNOSIS — Z8701 Personal history of pneumonia (recurrent): Secondary | ICD-10-CM | POA: Diagnosis not present

## 2021-08-17 DIAGNOSIS — R918 Other nonspecific abnormal finding of lung field: Secondary | ICD-10-CM | POA: Diagnosis not present

## 2021-08-17 DIAGNOSIS — J189 Pneumonia, unspecified organism: Secondary | ICD-10-CM | POA: Diagnosis not present

## 2021-08-17 DIAGNOSIS — J439 Emphysema, unspecified: Secondary | ICD-10-CM | POA: Diagnosis not present

## 2021-08-17 DIAGNOSIS — R911 Solitary pulmonary nodule: Secondary | ICD-10-CM | POA: Diagnosis not present

## 2021-08-24 DIAGNOSIS — F314 Bipolar disorder, current episode depressed, severe, without psychotic features: Secondary | ICD-10-CM | POA: Diagnosis not present

## 2021-08-24 DIAGNOSIS — F411 Generalized anxiety disorder: Secondary | ICD-10-CM | POA: Diagnosis not present

## 2021-08-28 ENCOUNTER — Telehealth: Payer: Self-pay | Admitting: Cardiology

## 2021-08-28 DIAGNOSIS — R918 Other nonspecific abnormal finding of lung field: Secondary | ICD-10-CM | POA: Diagnosis not present

## 2021-08-28 DIAGNOSIS — R400 Somnolence: Secondary | ICD-10-CM | POA: Diagnosis not present

## 2021-08-28 DIAGNOSIS — Z8701 Personal history of pneumonia (recurrent): Secondary | ICD-10-CM | POA: Diagnosis not present

## 2021-08-28 NOTE — Telephone Encounter (Signed)
?  Pt c/o medication issue: ? ?1. Name of Medication:  ? apixaban (ELIQUIS) 5 MG TABS tablet  ? ? ?2. How are you currently taking this medication (dosage and times per day)? Take 1 tablet (5 mg total) by mouth 2 (two) times daily. ? ?3. Are you having a reaction (difficulty breathing--STAT)?  ? ?4. What is your medication issue? Pt's wife is calling to get an update for pt assistance for eliquis, she said, pt is almost out of meds and requesting if there's samples available while waiting for pt assistance  ?

## 2021-08-28 NOTE — Telephone Encounter (Signed)
Left voicemail telling the patient that the samples were up front and could be picked up anytime. Left my callback number for any questions. ?

## 2021-08-31 ENCOUNTER — Ambulatory Visit: Payer: Medicare Other | Admitting: Cardiology

## 2021-09-04 DIAGNOSIS — R490 Dysphonia: Secondary | ICD-10-CM | POA: Diagnosis not present

## 2021-09-04 DIAGNOSIS — K220 Achalasia of cardia: Secondary | ICD-10-CM | POA: Diagnosis not present

## 2021-09-04 DIAGNOSIS — R1314 Dysphagia, pharyngoesophageal phase: Secondary | ICD-10-CM | POA: Diagnosis not present

## 2021-09-21 DIAGNOSIS — S8991XA Unspecified injury of right lower leg, initial encounter: Secondary | ICD-10-CM | POA: Diagnosis not present

## 2021-09-21 DIAGNOSIS — S42291A Other displaced fracture of upper end of right humerus, initial encounter for closed fracture: Secondary | ICD-10-CM | POA: Diagnosis not present

## 2021-09-21 DIAGNOSIS — S42211A Unspecified displaced fracture of surgical neck of right humerus, initial encounter for closed fracture: Secondary | ICD-10-CM | POA: Diagnosis not present

## 2021-09-21 DIAGNOSIS — Z043 Encounter for examination and observation following other accident: Secondary | ICD-10-CM | POA: Diagnosis not present

## 2021-09-21 DIAGNOSIS — G319 Degenerative disease of nervous system, unspecified: Secondary | ICD-10-CM | POA: Diagnosis not present

## 2021-09-21 DIAGNOSIS — Z23 Encounter for immunization: Secondary | ICD-10-CM | POA: Diagnosis not present

## 2021-09-21 DIAGNOSIS — Z7901 Long term (current) use of anticoagulants: Secondary | ICD-10-CM | POA: Diagnosis not present

## 2021-09-21 DIAGNOSIS — W19XXXA Unspecified fall, initial encounter: Secondary | ICD-10-CM | POA: Diagnosis not present

## 2021-09-21 DIAGNOSIS — I6782 Cerebral ischemia: Secondary | ICD-10-CM | POA: Diagnosis not present

## 2021-09-21 DIAGNOSIS — R531 Weakness: Secondary | ICD-10-CM | POA: Diagnosis not present

## 2021-09-21 DIAGNOSIS — M79661 Pain in right lower leg: Secondary | ICD-10-CM | POA: Diagnosis not present

## 2021-09-21 DIAGNOSIS — M1711 Unilateral primary osteoarthritis, right knee: Secondary | ICD-10-CM | POA: Diagnosis not present

## 2021-09-21 DIAGNOSIS — M79651 Pain in right thigh: Secondary | ICD-10-CM | POA: Diagnosis not present

## 2021-09-21 DIAGNOSIS — Z87891 Personal history of nicotine dependence: Secondary | ICD-10-CM | POA: Diagnosis not present

## 2021-09-21 DIAGNOSIS — M25511 Pain in right shoulder: Secondary | ICD-10-CM | POA: Diagnosis not present

## 2021-09-26 DIAGNOSIS — S42254A Nondisplaced fracture of greater tuberosity of right humerus, initial encounter for closed fracture: Secondary | ICD-10-CM | POA: Diagnosis not present

## 2021-10-04 DIAGNOSIS — S42254A Nondisplaced fracture of greater tuberosity of right humerus, initial encounter for closed fracture: Secondary | ICD-10-CM | POA: Diagnosis not present

## 2021-10-05 ENCOUNTER — Other Ambulatory Visit: Payer: Self-pay | Admitting: Gastroenterology

## 2021-10-06 ENCOUNTER — Telehealth: Payer: Self-pay

## 2021-10-06 DIAGNOSIS — I480 Paroxysmal atrial fibrillation: Secondary | ICD-10-CM

## 2021-10-06 NOTE — Telephone Encounter (Signed)
Patients wife came in asking about the list that the patient was added to for cheaper eliquis. If you could follow up with them at [phone] Thank you ?

## 2021-10-08 DIAGNOSIS — E7800 Pure hypercholesterolemia, unspecified: Secondary | ICD-10-CM | POA: Diagnosis not present

## 2021-10-08 DIAGNOSIS — E039 Hypothyroidism, unspecified: Secondary | ICD-10-CM | POA: Diagnosis not present

## 2021-10-09 DIAGNOSIS — Z6831 Body mass index (BMI) 31.0-31.9, adult: Secondary | ICD-10-CM | POA: Diagnosis not present

## 2021-10-09 DIAGNOSIS — Z0000 Encounter for general adult medical examination without abnormal findings: Secondary | ICD-10-CM | POA: Diagnosis not present

## 2021-10-09 DIAGNOSIS — E7800 Pure hypercholesterolemia, unspecified: Secondary | ICD-10-CM | POA: Diagnosis not present

## 2021-10-09 DIAGNOSIS — Z79899 Other long term (current) drug therapy: Secondary | ICD-10-CM | POA: Diagnosis not present

## 2021-10-09 DIAGNOSIS — E039 Hypothyroidism, unspecified: Secondary | ICD-10-CM | POA: Diagnosis not present

## 2021-10-09 DIAGNOSIS — C61 Malignant neoplasm of prostate: Secondary | ICD-10-CM | POA: Diagnosis not present

## 2021-10-09 DIAGNOSIS — Z1331 Encounter for screening for depression: Secondary | ICD-10-CM | POA: Diagnosis not present

## 2021-10-11 DIAGNOSIS — S42254A Nondisplaced fracture of greater tuberosity of right humerus, initial encounter for closed fracture: Secondary | ICD-10-CM | POA: Diagnosis not present

## 2021-10-25 DIAGNOSIS — Z6831 Body mass index (BMI) 31.0-31.9, adult: Secondary | ICD-10-CM | POA: Diagnosis not present

## 2021-10-25 DIAGNOSIS — M25461 Effusion, right knee: Secondary | ICD-10-CM | POA: Diagnosis not present

## 2021-10-25 MED ORDER — APIXABAN 5 MG PO TABS: 5.0000 mg | ORAL_TABLET | Freq: Two times a day (BID) | ORAL | 12 refills | Status: DC

## 2021-10-25 NOTE — Telephone Encounter (Signed)
Pt c/o medication issue: ? ?1. Name of Medication: Eliquis ? ?2. How are you currently taking this medication (dosage and times per day)?  ? ?3. Are you having a reaction (difficulty breathing--STAT)?  ? ?4. What is your medication issue?  ? ?Patient's wife is following up. She states the patient is completely out of medication and she would like to know if we have an update regarding patient assistance for Eliquis. Please advise. ? ?

## 2021-10-25 NOTE — Telephone Encounter (Signed)
Advised to call pt assistance as pt was denied due to not meeting 3% out of pocket. ?

## 2021-10-26 DIAGNOSIS — M1711 Unilateral primary osteoarthritis, right knee: Secondary | ICD-10-CM | POA: Diagnosis not present

## 2021-10-26 DIAGNOSIS — S8001XA Contusion of right knee, initial encounter: Secondary | ICD-10-CM | POA: Diagnosis not present

## 2021-10-27 ENCOUNTER — Other Ambulatory Visit: Payer: Self-pay

## 2021-10-27 ENCOUNTER — Telehealth: Payer: Self-pay | Admitting: Cardiology

## 2021-10-27 DIAGNOSIS — I480 Paroxysmal atrial fibrillation: Secondary | ICD-10-CM

## 2021-10-27 MED ORDER — APIXABAN 5 MG PO TABS: 5.0000 mg | ORAL_TABLET | Freq: Two times a day (BID) | ORAL | 5 refills | Status: DC

## 2021-10-27 NOTE — Telephone Encounter (Signed)
Prescription refill request for Eliquis received. Indication:Afib Last office visit:2/23 Scr:1.6 Age: 81 Weight:96.5 kg  Prescription refilled

## 2021-10-27 NOTE — Telephone Encounter (Signed)
*  STAT* If patient is at the pharmacy, call can be transferred to refill team.   1. Which medications need to be refilled? (please list name of each medication and dose if known) apixaban (ELIQUIS) 5 MG TABS tablet  2. Which pharmacy/location (including street and city if local pharmacy) is medication to be sent to? [id number] [place], [place] - [id number]  3. Do they need a 30 day or 90 day supply? 30 day  Patient wants generic form of Eliquis.

## 2021-10-31 ENCOUNTER — Encounter: Payer: Self-pay | Admitting: Student in an Organized Health Care Education/Training Program

## 2021-10-31 ENCOUNTER — Ambulatory Visit
Payer: Medicare Other | Attending: Student in an Organized Health Care Education/Training Program | Admitting: Student in an Organized Health Care Education/Training Program

## 2021-10-31 VITALS — BP 78/60 | HR 56 | Temp 97.2°F | Resp 18 | Ht 68.0 in | Wt 212.0 lb

## 2021-10-31 DIAGNOSIS — S134XXS Sprain of ligaments of cervical spine, sequela: Secondary | ICD-10-CM | POA: Diagnosis not present

## 2021-10-31 DIAGNOSIS — G8921 Chronic pain due to trauma: Secondary | ICD-10-CM | POA: Diagnosis not present

## 2021-10-31 DIAGNOSIS — M5030 Other cervical disc degeneration, unspecified cervical region: Secondary | ICD-10-CM | POA: Diagnosis not present

## 2021-10-31 DIAGNOSIS — G629 Polyneuropathy, unspecified: Secondary | ICD-10-CM | POA: Insufficient documentation

## 2021-10-31 DIAGNOSIS — M5412 Radiculopathy, cervical region: Secondary | ICD-10-CM | POA: Insufficient documentation

## 2021-10-31 MED ORDER — HYDROCODONE-ACETAMINOPHEN 10-325 MG PO TABS: 1.0000 | ORAL_TABLET | Freq: Four times a day (QID) | ORAL | 0 refills | Status: AC | PRN

## 2021-10-31 NOTE — Progress Notes (Signed)
Nursing Pain Medication Assessment:  Safety precautions to be maintained throughout the outpatient stay will include: orient to surroundings, keep bed in low position, maintain call bell within reach at all times, provide assistance with transfer out of bed and ambulation.  Medication Inspection Compliance: Pill count conducted under aseptic conditions, in front of the patient. Neither the pills nor the bottle was removed from the patient's sight at any time. Once count was completed pills were immediately returned to the patient in their original bottle.  Medication: Hydrocodone/APAP Pill/Patch Count:  3 of 120 pills remain Pill/Patch Appearance: Markings consistent with prescribed medication Bottle Appearance: Standard pharmacy container. Clearly labeled. Filled Date: 106 / 11 /  2022 Last Medication intake:  TodaySafety precautions to be maintained throughout the outpatient stay will include: orient to surroundings, keep bed in low position, maintain call bell within reach at all times, provide assistance with transfer out of bed and ambulation.
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
## 2021-11-01 DIAGNOSIS — S42254A Nondisplaced fracture of greater tuberosity of right humerus, initial encounter for closed fracture: Secondary | ICD-10-CM | POA: Diagnosis not present

## 2021-11-03 LAB — TOXASSURE SELECT 13 (MW), URINE

## 2021-11-07 ENCOUNTER — Telehealth (INDEPENDENT_AMBULATORY_CARE_PROVIDER_SITE_OTHER): Payer: Medicare Other

## 2021-11-07 DIAGNOSIS — I480 Paroxysmal atrial fibrillation: Secondary | ICD-10-CM

## 2021-11-07 MED ORDER — WARFARIN SODIUM 2.5 MG PO TABS: 2.5000 mg | ORAL_TABLET | Freq: Every day | ORAL | 0 refills | Status: DC

## 2021-11-07 NOTE — Telephone Encounter (Signed)
Pt wife called requesting a refill for Jantoven and Warfarin. Pt was advise that these medications were not listed on pts medication list and can not be refilled without authorization from provider. Pt was also advise that a message would be sent to Dr. Geraldo Pitter regarding her refill request. Pt stated she understood and she can be reached at [phone]. Please address. Thank you.

## 2021-11-07 NOTE — Telephone Encounter (Signed)
Will send in warfain 2.'5mg'$  daily.  See in Coumadin clinic in 1 week

## 2021-11-07 NOTE — Addendum Note (Signed)
Addended by: Leonidas Romberg on: 11/07/2021 04:43 PM   Modules accepted: Orders

## 2021-11-07 NOTE — Telephone Encounter (Signed)
Pt's wife Wilhelmenia Blase per DPR can no longer afford Eliquis and does not qualify for pt assistance. Marlyn states [employer] advised them to have pt changed to Coumadin. Advised that they must be setup with the coumadin clinic. Will route call.

## 2021-11-07 NOTE — Addendum Note (Signed)
Addended by: Rollen Sox on: 11/07/2021 04:30 PM   Modules accepted: Orders

## 2021-11-07 NOTE — Telephone Encounter (Signed)
Called and spoke with pt's wife. Made her aware that prescription was sent for Warfarin 2.'5mg'$  daily to pharmacy by Karren Cobble, Pharmacist. Began to give dosing instructions to switch from Eliquis to Warfarin; however, pt's wife stated he has not taken ANY Eliquis in weeks due to the cost. Instructed pt to begin taking Warfarin 2.'5mg'$  by mouth daily and scheduled Anticoagulation Appt for next Tuesday, 11/14/21 at 2:30pm. Verbalized understanding.

## 2021-11-09 ENCOUNTER — Other Ambulatory Visit: Payer: Self-pay | Admitting: Gastroenterology

## 2021-11-13 DIAGNOSIS — S42254A Nondisplaced fracture of greater tuberosity of right humerus, initial encounter for closed fracture: Secondary | ICD-10-CM | POA: Diagnosis not present

## 2021-11-14 ENCOUNTER — Ambulatory Visit (INDEPENDENT_AMBULATORY_CARE_PROVIDER_SITE_OTHER): Payer: Medicare Other

## 2021-11-14 DIAGNOSIS — I4891 Unspecified atrial fibrillation: Secondary | ICD-10-CM

## 2021-11-14 DIAGNOSIS — Z7901 Long term (current) use of anticoagulants: Secondary | ICD-10-CM | POA: Diagnosis not present

## 2021-11-14 HISTORY — DX: Long term (current) use of anticoagulants: Z79.01

## 2021-11-14 LAB — POCT INR: INR: 1 — AB (ref 2.0–3.0)

## 2021-11-14 NOTE — Patient Instructions (Signed)
Description   Take 2 tablets today and then START taking 1 tablet daily EXCEPT 2 tablets Mondays, Wednesday, and Fridays.  Stay consistent with greens (2-3 times per week)  Coumadin Clinic [phone]         A full discussion of the nature of anticoagulants has been carried out.  A benefit risk analysis has been presented to the patient, so that they understand the justification for choosing anticoagulation at this time. The need for frequent and regular monitoring, precise dosage adjustment and compliance is stressed.  Side effects of potential bleeding are discussed.  The patient should avoid any OTC items containing aspirin or ibuprofen, and should avoid great swings in general diet.  Avoid alcohol consumption.  Call if any signs of abnormal bleeding.

## 2021-11-21 ENCOUNTER — Ambulatory Visit (INDEPENDENT_AMBULATORY_CARE_PROVIDER_SITE_OTHER): Payer: Medicare Other

## 2021-11-21 DIAGNOSIS — Z7901 Long term (current) use of anticoagulants: Secondary | ICD-10-CM | POA: Diagnosis not present

## 2021-11-21 DIAGNOSIS — I4891 Unspecified atrial fibrillation: Secondary | ICD-10-CM

## 2021-11-21 DIAGNOSIS — I480 Paroxysmal atrial fibrillation: Secondary | ICD-10-CM

## 2021-11-21 DIAGNOSIS — E039 Hypothyroidism, unspecified: Secondary | ICD-10-CM | POA: Diagnosis not present

## 2021-11-21 LAB — POCT INR: INR: 1 — AB (ref 2.0–3.0)

## 2021-11-21 NOTE — Patient Instructions (Signed)
Description   Take 2.5 tablets today and then START taking 2 tablets daily  Stay consistent with greens (2-3 times per week)  Coumadin Clinic [phone]

## 2021-11-28 ENCOUNTER — Other Ambulatory Visit: Payer: Self-pay

## 2021-11-28 ENCOUNTER — Ambulatory Visit (INDEPENDENT_AMBULATORY_CARE_PROVIDER_SITE_OTHER): Payer: Medicare Other

## 2021-11-28 DIAGNOSIS — I4891 Unspecified atrial fibrillation: Secondary | ICD-10-CM | POA: Diagnosis not present

## 2021-11-28 DIAGNOSIS — R400 Somnolence: Secondary | ICD-10-CM | POA: Diagnosis not present

## 2021-11-28 DIAGNOSIS — Z7901 Long term (current) use of anticoagulants: Secondary | ICD-10-CM | POA: Diagnosis not present

## 2021-11-28 DIAGNOSIS — I480 Paroxysmal atrial fibrillation: Secondary | ICD-10-CM | POA: Diagnosis not present

## 2021-11-28 DIAGNOSIS — Z8701 Personal history of pneumonia (recurrent): Secondary | ICD-10-CM | POA: Diagnosis not present

## 2021-11-28 DIAGNOSIS — R918 Other nonspecific abnormal finding of lung field: Secondary | ICD-10-CM | POA: Diagnosis not present

## 2021-11-28 DIAGNOSIS — J4520 Mild intermittent asthma, uncomplicated: Secondary | ICD-10-CM | POA: Diagnosis not present

## 2021-11-28 LAB — POCT INR: INR: 1.6 — AB (ref 2.0–3.0)

## 2021-11-28 MED ORDER — WARFARIN SODIUM 2.5 MG PO TABS: ORAL_TABLET | ORAL | 0 refills | Status: DC

## 2021-11-28 NOTE — Telephone Encounter (Signed)
Prescription refill request received for warfarin Lov: 06/19/21 ([place])  Next INR check: 12/05/21 Warfarin tablet strength: 2.'5mg'$   Appropriate dose and refill sent to requested pharmacy.

## 2021-11-28 NOTE — Patient Instructions (Signed)
Description   Take 2.5 tablets today and then START taking 2 tablets daily EXCEPT 2.5 tablets on Mondays, Wednesdays, and Fridays.  Stay consistent with greens (2-3 times per week)  Stay consistent with protein drinks (2-3 per day)  Coumadin Clinic [phone]

## 2021-11-30 ENCOUNTER — Other Ambulatory Visit: Payer: Self-pay | Admitting: Cardiology

## 2021-11-30 DIAGNOSIS — I480 Paroxysmal atrial fibrillation: Secondary | ICD-10-CM

## 2021-12-05 ENCOUNTER — Ambulatory Visit (INDEPENDENT_AMBULATORY_CARE_PROVIDER_SITE_OTHER): Payer: Medicare Other

## 2021-12-05 DIAGNOSIS — I4891 Unspecified atrial fibrillation: Secondary | ICD-10-CM

## 2021-12-05 DIAGNOSIS — Z7901 Long term (current) use of anticoagulants: Secondary | ICD-10-CM | POA: Diagnosis not present

## 2021-12-05 DIAGNOSIS — I480 Paroxysmal atrial fibrillation: Secondary | ICD-10-CM | POA: Diagnosis not present

## 2021-12-05 LAB — POCT INR: INR: 1.7 — AB (ref 2.0–3.0)

## 2021-12-13 ENCOUNTER — Ambulatory Visit (INDEPENDENT_AMBULATORY_CARE_PROVIDER_SITE_OTHER): Payer: Medicare Other | Admitting: *Deleted

## 2021-12-13 DIAGNOSIS — Z5181 Encounter for therapeutic drug level monitoring: Secondary | ICD-10-CM | POA: Diagnosis not present

## 2021-12-13 DIAGNOSIS — I480 Paroxysmal atrial fibrillation: Secondary | ICD-10-CM | POA: Diagnosis not present

## 2021-12-13 LAB — POCT INR: INR: 3.3 — AB (ref 2.0–3.0)

## 2021-12-13 NOTE — Patient Instructions (Signed)
Description   Hold warfarin today and then start taking warfarin 2.5 tablets daily. Recheck INR in 1 week.  Stay consistent with greens (2-3 times per week)  Stay consistent with protein drinks (2-3 per day)  Coumadin Clinic [phone]

## 2021-12-20 DIAGNOSIS — Z87891 Personal history of nicotine dependence: Secondary | ICD-10-CM | POA: Diagnosis not present

## 2021-12-20 DIAGNOSIS — N1832 Chronic kidney disease, stage 3b: Secondary | ICD-10-CM | POA: Diagnosis not present

## 2021-12-20 DIAGNOSIS — I129 Hypertensive chronic kidney disease with stage 1 through stage 4 chronic kidney disease, or unspecified chronic kidney disease: Secondary | ICD-10-CM | POA: Diagnosis not present

## 2021-12-20 DIAGNOSIS — I480 Paroxysmal atrial fibrillation: Secondary | ICD-10-CM | POA: Diagnosis not present

## 2021-12-20 DIAGNOSIS — Z1331 Encounter for screening for depression: Secondary | ICD-10-CM | POA: Diagnosis not present

## 2021-12-20 HISTORY — DX: Chronic kidney disease, stage 3b: N18.32

## 2021-12-24 ENCOUNTER — Other Ambulatory Visit: Payer: Self-pay | Admitting: Cardiology

## 2021-12-24 DIAGNOSIS — I480 Paroxysmal atrial fibrillation: Secondary | ICD-10-CM

## 2021-12-26 DIAGNOSIS — N1832 Chronic kidney disease, stage 3b: Secondary | ICD-10-CM | POA: Diagnosis not present

## 2021-12-26 DIAGNOSIS — N200 Calculus of kidney: Secondary | ICD-10-CM | POA: Diagnosis not present

## 2021-12-26 DIAGNOSIS — N2889 Other specified disorders of kidney and ureter: Secondary | ICD-10-CM | POA: Diagnosis not present

## 2022-01-02 ENCOUNTER — Telehealth: Payer: Self-pay

## 2022-01-02 DIAGNOSIS — I4891 Unspecified atrial fibrillation: Secondary | ICD-10-CM

## 2022-01-02 NOTE — Telephone Encounter (Signed)
Called and spoke with pt's wife. She stated she completely forgot about pt's appt with anticoagulation clinic today. I explained how critical it is for pt to have INR checked as soon as possible because he has not had INR checked since 12/13/21 and has still not been in range. I educated pt's wife the importance of checking INR weekly until INR is within range consecutively. She agreed to bring pt to [facility] lab in the morning to have PT/INR drawn and anticoagulation clinic call call as soon as labs are results with dosing instructions. Pt's wife apologized for missed his appt again.   Lab order placed.

## 2022-01-04 ENCOUNTER — Telehealth: Payer: Self-pay

## 2022-01-04 DIAGNOSIS — I4891 Unspecified atrial fibrillation: Secondary | ICD-10-CM | POA: Diagnosis not present

## 2022-01-04 NOTE — Telephone Encounter (Signed)
Called and spoke with pt's wife. She stated she was on the way to [facility] office to to have pt's PT/INR drawn.

## 2022-01-05 ENCOUNTER — Ambulatory Visit (INDEPENDENT_AMBULATORY_CARE_PROVIDER_SITE_OTHER): Payer: Medicare Other

## 2022-01-05 DIAGNOSIS — Z7901 Long term (current) use of anticoagulants: Secondary | ICD-10-CM

## 2022-01-05 DIAGNOSIS — I4891 Unspecified atrial fibrillation: Secondary | ICD-10-CM | POA: Diagnosis not present

## 2022-01-05 DIAGNOSIS — S42254A Nondisplaced fracture of greater tuberosity of right humerus, initial encounter for closed fracture: Secondary | ICD-10-CM | POA: Diagnosis not present

## 2022-01-05 LAB — PROTIME-INR
INR: 1.8 — ABNORMAL HIGH (ref 0.9–1.2)
Prothrombin Time: 18.3 s — ABNORMAL HIGH (ref 9.1–12.0)

## 2022-01-05 NOTE — Patient Instructions (Signed)
Description   Called and spoke with pt's wife. Instructed for pt to take 3 tablets today and then continue taking warfarin 2.5 tablets daily.  Recheck INR in 1 week.  Stay consistent with greens (2-3 times per week)  Stay consistent with protein drinks (2-3 per day)  Coumadin Clinic [phone]

## 2022-01-16 ENCOUNTER — Ambulatory Visit (INDEPENDENT_AMBULATORY_CARE_PROVIDER_SITE_OTHER): Payer: Medicare Other

## 2022-01-16 DIAGNOSIS — I4891 Unspecified atrial fibrillation: Secondary | ICD-10-CM

## 2022-01-16 DIAGNOSIS — Z7901 Long term (current) use of anticoagulants: Secondary | ICD-10-CM | POA: Diagnosis not present

## 2022-01-16 DIAGNOSIS — I480 Paroxysmal atrial fibrillation: Secondary | ICD-10-CM | POA: Diagnosis not present

## 2022-01-16 LAB — POCT INR: INR: 2.8 (ref 2.0–3.0)

## 2022-01-16 NOTE — Patient Instructions (Signed)
Description   Continue taking warfarin 2.5 tablets daily.  Recheck INR in 1 week at your visit with Dr Geraldo Pitter.  Stay consistent with greens (2-3 times per week)  Stay consistent with protein drinks (2-3 per day)  Coumadin Clinic [phone]

## 2022-01-20 ENCOUNTER — Other Ambulatory Visit: Payer: Self-pay | Admitting: Cardiology

## 2022-01-20 DIAGNOSIS — I480 Paroxysmal atrial fibrillation: Secondary | ICD-10-CM

## 2022-01-22 ENCOUNTER — Ambulatory Visit (INDEPENDENT_AMBULATORY_CARE_PROVIDER_SITE_OTHER): Payer: Medicare Other | Admitting: Cardiology

## 2022-01-22 ENCOUNTER — Encounter: Payer: Self-pay | Admitting: Cardiology

## 2022-01-22 VITALS — BP 120/74 | HR 59 | Ht 69.0 in | Wt 209.6 lb

## 2022-01-22 DIAGNOSIS — E7800 Pure hypercholesterolemia, unspecified: Secondary | ICD-10-CM | POA: Diagnosis not present

## 2022-01-22 DIAGNOSIS — I350 Nonrheumatic aortic (valve) stenosis: Secondary | ICD-10-CM | POA: Diagnosis not present

## 2022-01-22 DIAGNOSIS — I2510 Atherosclerotic heart disease of native coronary artery without angina pectoris: Secondary | ICD-10-CM

## 2022-01-22 DIAGNOSIS — Z7901 Long term (current) use of anticoagulants: Secondary | ICD-10-CM | POA: Diagnosis not present

## 2022-01-22 DIAGNOSIS — I4891 Unspecified atrial fibrillation: Secondary | ICD-10-CM | POA: Diagnosis not present

## 2022-01-22 DIAGNOSIS — N1832 Chronic kidney disease, stage 3b: Secondary | ICD-10-CM | POA: Diagnosis not present

## 2022-01-22 DIAGNOSIS — I2584 Coronary atherosclerosis due to calcified coronary lesion: Secondary | ICD-10-CM | POA: Diagnosis not present

## 2022-01-22 DIAGNOSIS — I480 Paroxysmal atrial fibrillation: Secondary | ICD-10-CM | POA: Diagnosis not present

## 2022-01-22 DIAGNOSIS — I10 Essential (primary) hypertension: Secondary | ICD-10-CM

## 2022-01-22 MED ORDER — METOPROLOL TARTRATE 50 MG PO TABS: 50.0000 mg | ORAL_TABLET | Freq: Two times a day (BID) | ORAL | 3 refills | Status: DC

## 2022-01-22 NOTE — Progress Notes (Signed)
Cardiology Office Note:    Date:  01/22/2022   ID:  [name], DOB 11/14/1940, MRN [id number]  PCP:  [name], MD  Cardiologist:  Jenean Lindau, MD   Referring MD: [name], MD    ASSESSMENT:    1. Coronary artery calcification   2. Essential hypertension   3. Hypercholesteremia   4. Mild aortic stenosis   5. Paroxysmal atrial fibrillation (HCC)   6. Stage 3b chronic kidney disease (HCC)    PLAN:    In order of problems listed above:  Coronary artery calcification: Secondary prevention stressed with the patient.  Importance of compliance with diet medication stressed.  He was asked to ambulate to the best of his ability. Mild aortic stenosis: Stable and echocardiogram will be done to assess this. Essential hypertension and fatigue: I discussed this with the patient at length.  I have cut down beta-blocker to half the dose.  They will keep a track of pulse and blood pressures and I will review this in intervene if necessary. Mixed dyslipidemia: Lipids were reviewed and diet was emphasized. Paroxysmal atrial fibrillation:I discussed with the patient atrial fibrillation, disease process. Management and therapy including rate and rhythm control, anticoagulation benefits and potential risks were discussed extensively with the patient. Patient had multiple questions which were answered to patient's satisfaction. Patient will be seen in follow-up appointment in 6 months or earlier if the patient has any concerns    Medication Adjustments/Labs and Tests Ordered: Current medicines are reviewed at length with the patient today.  Concerns regarding medicines are outlined above.  No orders of the defined types were placed in this encounter.  No orders of the defined types were placed in this encounter.    No chief complaint on file.    History of Present Illness:    [name] is a 81 y.o. male.  Patient has past medical history of coronary  artery calcification, essential hypertension, dyslipidemia, mild aortic stenosis, paroxysmal atrial fibrillation and renal insufficiency.  He has issues with dementia.  He denies any problems at this time.  His wife mentions to me that he remains tired most times.  No chest pain orthopnea or PND.  No syncope or dizziness.  At the time of my evaluation, the patient is alert awake oriented and in no distress.  Past Medical History:  Diagnosis Date   Acquired hypothyroidism 08/17/2014   Acute MI, true posterior wall, subsequent episode of care ([place]) 08/15/2014   EF 44% with mildly reduced LV function  Formatting of this note might be different from the original. EF 44% with mildly reduced LV function   AKI (acute kidney injury) ([place]) 09/20/2014   Anxiety    Atrial fibrillation ([place]) 09/28/2019   ablation in past  Formatting of this note might be different from the original. ablation in past   Bipolar disorder [facility])    w/dementia/psychotic episodes   Bradycardia 09/17/2014   Cancer ([place]) 09/28/2019   Prostate Cancer; Throat Cancer  Formatting of this note might be different from the original. Prostate Cancer; Throat Cancer   Cardiac murmur 09/28/2019   Cataract    Cervical radicular pain 07/16/2019   Cervical spondylosis 06/19/2019   Chest pain with high risk for cardiac etiology 08/19/2014   Chronic depression    Chronic pain due to trauma 07/16/2019   Corn of toe 12/11/2018   Coronary artery calcification 07/25/2021   Coronary artery disease 08/10/2014   DDD (degenerative disc disease),  cervical 09/29/2018   Degenerative lumbar disc    Dementia without behavioral disturbance ([place]) 07/16/2019   Disease characterized by destruction of skeletal muscle 08/15/2014   Essential hypertension 09/28/2019   Essential tremor 09/21/2014   History of MI (myocardial infarction) 09/14/2014   Hypercholesteremia    Hypertension    Hypotension 09/14/2014   Long term (current) use of anticoagulants 11/14/2021   Malignant neoplasm of  prostate ([place])    Mild aortic stenosis 11/26/2019   Mood change 10/12/2019   Myocardial infarction ([place]) 08/10/2014   Nausea and vomiting 08/19/2014   Onychomycosis of left great toe 08/21/2014   Pain of fifth toe 12/11/2018   Paroxysmal atrial fibrillation ([place]) 07/25/2021   Peptic esophagitis    Peripheral neuropathy    Peripheral neuropathy due to chemotherapy ([place]) 09/21/2014   Prolonged Q-T interval on ECG 08/19/2014   Prostate cancer ([place]) 09/28/2019   Reactive depression 10/12/2019   Renal insufficiency 08/17/2014   Right bundle branch block 09/28/2019   Sepsis ([place]) 05/18/2020   Small fiber neuropathy 12/16/2018   Squamous cell carcinoma of left tonsil (HCC)    Stage 3b chronic kidney disease ([place]) 12/20/2021   Throat cancer ([place])    Thyroid disease    Transaminitis 08/17/2014   Unstable angina ([place]) 09/14/2014   Whiplash injury syndrome, sequela 08/11/2018    Past Surgical History:  Procedure Laterality Date   CARDIAC ELECTROPHYSIOLOGY STUDY AND ABLATION     COLONOSCOPY  04/07/2007   Small colonic polyp status post polypectomy. Pancolonic diverticulosis predominantly in the sigmoid colon. Internal hemorrhoids.    ESOPHAGOGASTRODUODENOSCOPY  06/15/2010   Erosive esophagitis. Status post PEG placment   EYE SURGERY     INGUINAL HERNIA REPAIR     PROSTATE BIOPSY     SEBACEOUS CYST REMOVAL     TONSILLECTOMY      Current Medications: Current Meds  Medication Sig   Cholecalciferol (VITAMIN D3) 125 MCG (5000 UT) TABS Take 5,000 Units by mouth daily.   cloNIDine (CATAPRES) 0.1 MG tablet Take 0.1 mg by mouth daily.   Docusate Sodium (STOOL SOFTENER LAXATIVE PO) Take 1 tablet by mouth as needed for constipation.   famotidine (PEPCID) 40 MG tablet Take 40 mg by mouth daily.   furosemide (LASIX) 20 MG tablet Take 20 mg by mouth daily as needed for edema or fluid.   levothyroxine (SYNTHROID) 125 MCG tablet Take 125 mcg by mouth daily.   LORazepam (ATIVAN) 1 MG tablet Take 1 mg by mouth 3 (three)  times daily.   metoprolol tartrate (LOPRESSOR) 100 MG tablet Take 1 tablet (100 mg total) by mouth 2 (two) times daily.   Omega-3 Fatty Acids (FISH OIL) 1000 MG CPDR Take 2,000 mg by mouth 2 (two) times daily.   omeprazole (PRILOSEC) 40 MG capsule Take 40 mg by mouth 2 (two) times daily.   polyethylene glycol (MIRALAX / GLYCOLAX) 17 g packet Take 17 g by mouth daily as needed for constipation.   QUEtiapine (SEROQUEL) 400 MG tablet Take 1 tablet by mouth every evening.   rosuvastatin (CRESTOR) 10 MG tablet Take 10 mg by mouth daily.   warfarin (COUMADIN) 2.5 MG tablet TAKE 2 1/2 TABLETS (6.'25MG'$ ) DAILY AS DIRECTED BY COUMADIN CLINIC.     Allergies:   Trileptal [oxcarbazepine]   Social History   Socioeconomic History   Marital status: Married    Spouse name: Not on file   Number of children: 3   Years of education: 10th grade   Highest education level: Not  on file  Occupational History   Occupation: Retired  Tobacco Use   Smoking status: Former    Types: Cigarettes    Quit date: 2011    Years since quitting: 12.6   Smokeless tobacco: Former  Scientific laboratory technician Use: Never used  Substance and Sexual Activity   Alcohol use: Not Currently   Drug use: Never   Sexual activity: Not on file  Other Topics Concern   Not on file  Social History Narrative   Lives at home with wife.   Right-handed.   2 cups of caffeine per day.   Social Determinants of Health   Financial Resource Strain: Not on file  Food Insecurity: Not on file  Transportation Needs: Not on file  Physical Activity: Not on file  Stress: Not on file  Social Connections: Not on file     Family History: The patient's family history includes Heart disease in his father; Other in his mother; Prostate cancer in his father; Stomach cancer in his father. There is no history of Colon cancer, Esophageal cancer, or Rectal cancer.  ROS:   Please see the history of present illness.    All other systems reviewed and are  negative.  EKGs/Labs/Other Studies Reviewed:    The following studies were reviewed today: I discussed my findings with the patient at length.  EKG reveals sinus rhythm and nonspecific ST-T changes   Recent Labs: 06/16/2021: TSH 3.870 07/25/2021: ALT 7; BUN 19; Creatinine, Ser 1.68; Potassium 4.7; Sodium 141  Recent Lipid Panel    Component Value Date/Time   CHOL 173 07/25/2021 0923   TRIG 261 (H) 07/25/2021 0923   HDL 44 07/25/2021 0923   CHOLHDL 3.9 07/25/2021 0923   LDLCALC 86 07/25/2021 0923    Physical Exam:    VS:  BP 120/74   Pulse (!) 59   Ht '5\' 9"'$  (1.753 m)   Wt 209 lb 9.6 oz (95.1 kg)   SpO2 96%   BMI 30.95 kg/m     Wt Readings from Last 3 Encounters:  01/22/22 209 lb 9.6 oz (95.1 kg)  10/31/21 212 lb (96.2 kg)  07/25/21 212 lb 12.8 oz (96.5 kg)     GEN: Patient is in no acute distress HEENT: Normal NECK: No JVD; No carotid bruits LYMPHATICS: No lymphadenopathy CARDIAC: Hear sounds regular, 2/6 systolic murmur at the apex. RESPIRATORY:  Clear to auscultation without rales, wheezing or rhonchi  ABDOMEN: Soft, non-tender, non-distended MUSCULOSKELETAL:  No edema; No deformity  SKIN: Warm and dry NEUROLOGIC:  Alert and oriented x 3 PSYCHIATRIC:  Normal affect   Signed, Jenean Lindau, MD  01/22/2022 3:49 PM    [facility]

## 2022-01-22 NOTE — Telephone Encounter (Signed)
Prescription refill request received for warfarin Lov: 07/25/21 ([facility]) Next INR check: 01/23/22 Warfarin tablet strength: 2.'5mg'$   Appropriate dose and refill sent to requested pharmacy.

## 2022-01-22 NOTE — Patient Instructions (Signed)
Please keep a BP log for 2 weeks and send by MyChart or mail.  Blood Pressure Record Sheet To take your blood pressure, you will need a blood pressure machine. You can buy a blood pressure machine (blood pressure monitor) at your clinic, drug store, or online. When choosing one, consider: An automatic monitor that has an arm cuff. A cuff that wraps snugly around your upper arm. You should be able to fit only one finger between your arm and the cuff. A device that stores blood pressure reading results. Do not choose a monitor that measures your blood pressure from your wrist or finger. Follow your health care provider's instructions for how to take your blood pressure. To use this form: Get one reading in the morning (a.m.) 1-2 hours after you take any medicines. Get one reading in the evening (p.m.) before supper. Write down the results in the spaces on this form. Repeat this once a week, or as told by your health care provider.  Make a follow-up appointment with your health care provider to discuss the results. Blood pressure log  Date: _______________________  a.m. _____________________(1st reading) HR___________            p.m. _____________________(2nd reading) HR__________  Date: _______________________  a.m. _____________________(1st reading) HR___________            p.m. _____________________(2nd reading) HR__________  Date: _______________________  a.m. _____________________(1st reading) HR___________            p.m. _____________________(2nd reading) HR__________  Date: _______________________   a.m. _____________________(1st reading) HR___________            p.m. _____________________(2nd reading) HR__________  Date: _______________________  a.m. _____________________(1st reading) HR___________            p.m. _____________________(2nd reading) HR__________  Date: _______________________  a.m. _____________________(1st reading) HR___________             p.m. _____________________(2nd reading) HR__________  Date: _______________________  a.m. _____________________(1st reading) HR___________            p.m. _____________________(2nd reading) HR__________   This information is not intended to replace advice given to you by your health care provider. Make sure you discuss any questions you have with your health care provider. Document Revised: 09/16/2019 Document Reviewed: 09/16/2019  Medication Instructions:  Your physician has recommended you make the following change in your medication:   Decrease your Metoprolol to 50 mg twice daily. Take 1/2 tablet of your current dose until your next refill and we will send in a 50 mg tablet.  *If you need a refill on your cardiac medications before your next appointment, please call your pharmacy*   Lab Work: None ordered If you have labs (blood work) drawn today and your tests are completely normal, you will receive your results only by: [place] (if you have MyChart) OR A paper copy in the mail If you have any lab test that is abnormal or we need to change your treatment, we will call you to review the results.   Testing/Procedures: Your physician has requested that you have an echocardiogram. Echocardiography is a painless test that uses sound waves to create images of your heart. It provides your doctor with information about the size and shape of your heart and how well your heart's chambers and valves are working. This procedure takes approximately one hour. There are no restrictions for this procedure.    Follow-Up: At [facility], you and your health needs are our priority.  As part of our continuing  mission to provide you with exceptional heart care, we have created designated Provider Care Teams.  These Care Teams include your primary Cardiologist (physician) and Advanced Practice Providers (APPs -  Physician Assistants and Nurse Practitioners) who all work together to  provide you with the care you need, when you need it.  We recommend signing up for the patient portal called "MyChart".  Sign up information is provided on this After Visit Summary.  MyChart is used to connect with patients for Virtual Visits (Telemedicine).  Patients are able to view lab/test results, encounter notes, upcoming appointments, etc.  Non-urgent messages can be sent to your provider as well.   To learn more about what you can do with MyChart, go to [email or web address].    Your next appointment:   6 month(s)  The format for your next appointment:   In Person  Provider:   Jyl Heinz, MD   Other Instructions Echocardiogram An echocardiogram is a test that uses sound waves (ultrasound) to produce images of the heart. Images from an echocardiogram can provide important information about: Heart size and shape. The size and thickness and movement of your heart's walls. Heart muscle function and strength. Heart valve function or if you have stenosis. Stenosis is when the heart valves are too narrow. If blood is flowing backward through the heart valves (regurgitation). A tumor or infectious growth around the heart valves. Areas of heart muscle that are not working well because of poor blood flow or injury from a heart attack. Aneurysm detection. An aneurysm is a weak or damaged part of an artery wall. The wall bulges out from the normal force of blood pumping through the body. Tell a health care provider about: Any allergies you have. All medicines you are taking, including vitamins, herbs, eye drops, creams, and over-the-counter medicines. Any blood disorders you have. Any surgeries you have had. Any medical conditions you have. Whether you are pregnant or may be pregnant. What are the risks? Generally, this is a safe test. However, problems may occur, including an allergic reaction to dye (contrast) that may be used during the test. What happens before the test? No  specific preparation is needed. You may eat and drink normally. What happens during the test? You will take off your clothes from the waist up and put on a hospital gown. Electrodes or electrocardiogram (ECG)patches may be placed on your chest. The electrodes or patches are then connected to a device that monitors your heart rate and rhythm. You will lie down on a table for an ultrasound exam. A gel will be applied to your chest to help sound waves pass through your skin. A handheld device, called a transducer, will be pressed against your chest and moved over your heart. The transducer produces sound waves that travel to your heart and bounce back (or "echo" back) to the transducer. These sound waves will be captured in real-time and changed into images of your heart that can be viewed on a video monitor. The images will be recorded on a computer and reviewed by your health care provider. You may be asked to change positions or hold your breath for a short time. This makes it easier to get different views or better views of your heart. In some cases, you may receive contrast through an IV in one of your veins. This can improve the quality of the pictures from your heart. The procedure may vary among health care providers and hospitals.   What can I expect after  the test? You may return to your normal, everyday life, including diet, activities, and medicines, unless your health care provider tells you not to do that. Follow these instructions at home: It is up to you to get the results of your test. Ask your health care provider, or the department that is doing the test, when your results will be ready. Keep all follow-up visits. This is important. Summary An echocardiogram is a test that uses sound waves (ultrasound) to produce images of the heart. Images from an echocardiogram can provide important information about the size and shape of your heart, heart muscle function, heart valve function, and  other possible heart problems. You do not need to do anything to prepare before this test. You may eat and drink normally. After the echocardiogram is completed, you may return to your normal, everyday life, unless your health care provider tells you not to do that. This information is not intended to replace advice given to you by your health care provider. Make sure you discuss any questions you have with your health care provider. Document Revised: 01/19/2020 Document Reviewed: 01/19/2020 Elsevier Patient Education  2021 [place] Patient Education  2021 [employer].

## 2022-01-23 ENCOUNTER — Ambulatory Visit (INDEPENDENT_AMBULATORY_CARE_PROVIDER_SITE_OTHER): Payer: Medicare Other

## 2022-01-23 DIAGNOSIS — Z7901 Long term (current) use of anticoagulants: Secondary | ICD-10-CM

## 2022-01-23 DIAGNOSIS — I4891 Unspecified atrial fibrillation: Secondary | ICD-10-CM | POA: Diagnosis not present

## 2022-01-23 LAB — PROTIME-INR
INR: 2.2 — ABNORMAL HIGH (ref 0.9–1.2)
Prothrombin Time: 22.4 s — ABNORMAL HIGH (ref 9.1–12.0)

## 2022-01-23 NOTE — Patient Instructions (Signed)
Description   Continue taking warfarin 2.5 tablets daily.  Recheck INR in 2 weeks (per pt's wife request. Made aware of risk) .  Stay consistent with greens (2-3 times per week)  Stay consistent with protein drinks (2-3 per day)  Coumadin Clinic [phone]

## 2022-01-25 ENCOUNTER — Other Ambulatory Visit: Payer: Medicare Other

## 2022-01-31 ENCOUNTER — Other Ambulatory Visit: Payer: Medicare Other

## 2022-02-06 ENCOUNTER — Ambulatory Visit: Payer: Medicare Other | Attending: Cardiology

## 2022-02-06 DIAGNOSIS — Z7901 Long term (current) use of anticoagulants: Secondary | ICD-10-CM | POA: Insufficient documentation

## 2022-02-06 DIAGNOSIS — I4891 Unspecified atrial fibrillation: Secondary | ICD-10-CM | POA: Insufficient documentation

## 2022-02-06 DIAGNOSIS — I480 Paroxysmal atrial fibrillation: Secondary | ICD-10-CM | POA: Diagnosis not present

## 2022-02-06 LAB — POCT INR: INR: 3.2 — AB (ref 2.0–3.0)

## 2022-02-06 NOTE — Patient Instructions (Signed)
Description   Only take 1 tablet tomorrow and then continue taking warfarin 2.5 tablets daily.  Recheck INR in 1 week.  Stay consistent with greens (2-3 times per week)  Stay consistent with protein drinks (2-3 per day)  Coumadin Clinic [phone]

## 2022-02-08 ENCOUNTER — Ambulatory Visit: Payer: Medicare Other

## 2022-02-13 ENCOUNTER — Ambulatory Visit: Payer: Medicare Other | Attending: Cardiology

## 2022-02-13 DIAGNOSIS — Z7901 Long term (current) use of anticoagulants: Secondary | ICD-10-CM | POA: Diagnosis not present

## 2022-02-13 DIAGNOSIS — I480 Paroxysmal atrial fibrillation: Secondary | ICD-10-CM | POA: Insufficient documentation

## 2022-02-13 DIAGNOSIS — I4891 Unspecified atrial fibrillation: Secondary | ICD-10-CM | POA: Insufficient documentation

## 2022-02-13 LAB — POCT INR: INR: 2.3 (ref 2.0–3.0)

## 2022-02-13 NOTE — Patient Instructions (Signed)
Description   Continue taking warfarin 2.5 tablets daily.  Recheck INR in 2 weeks.  Stay consistent with greens (2-3 times per week)  Stay consistent with protein drinks (2-3 per day)  Coumadin Clinic [phone]

## 2022-02-14 ENCOUNTER — Other Ambulatory Visit: Payer: Self-pay

## 2022-02-14 DIAGNOSIS — I480 Paroxysmal atrial fibrillation: Secondary | ICD-10-CM

## 2022-02-14 MED ORDER — WARFARIN SODIUM 2.5 MG PO TABS: ORAL_TABLET | ORAL | 1 refills | Status: DC

## 2022-02-16 DIAGNOSIS — F419 Anxiety disorder, unspecified: Secondary | ICD-10-CM | POA: Diagnosis not present

## 2022-02-16 DIAGNOSIS — Z6830 Body mass index (BMI) 30.0-30.9, adult: Secondary | ICD-10-CM | POA: Diagnosis not present

## 2022-02-16 DIAGNOSIS — L03114 Cellulitis of left upper limb: Secondary | ICD-10-CM | POA: Diagnosis not present

## 2022-02-20 DIAGNOSIS — F314 Bipolar disorder, current episode depressed, severe, without psychotic features: Secondary | ICD-10-CM | POA: Diagnosis not present

## 2022-02-20 DIAGNOSIS — F411 Generalized anxiety disorder: Secondary | ICD-10-CM | POA: Diagnosis not present

## 2022-02-27 ENCOUNTER — Telehealth: Payer: Self-pay | Admitting: Cardiology

## 2022-02-27 ENCOUNTER — Ambulatory Visit: Payer: Medicare Other | Attending: Cardiology

## 2022-02-27 DIAGNOSIS — I480 Paroxysmal atrial fibrillation: Secondary | ICD-10-CM | POA: Insufficient documentation

## 2022-02-27 DIAGNOSIS — Z7901 Long term (current) use of anticoagulants: Secondary | ICD-10-CM | POA: Diagnosis not present

## 2022-02-27 DIAGNOSIS — I4891 Unspecified atrial fibrillation: Secondary | ICD-10-CM | POA: Insufficient documentation

## 2022-02-27 NOTE — Patient Instructions (Signed)
Description   POC INR >8.0 - STAT PT/INR ordered and sent pt to lab  Instructed pt to HOLD Warfarin and eat greens until further instructed by Coumadin Clinic  Normal dose: warfarin 2.5 tablets daily.  Recheck INR in 1 week  Stay consistent with greens (2-3 times per week)  Stay consistent with protein drinks (2-3 per day)  Coumadin Clinic [phone]

## 2022-02-27 NOTE — Telephone Encounter (Signed)
I received a call from [facility] that patient's IRN came back as >10.0. I called patient and spoke to his wife, [name]. [name] informed me that the coumadin clinic had already instructed her to monitor [name] closely for any signs of bleeding, to hold warfarin, and to eat greens/protein drinks. She also told me that the coumadin clinic planned to call her in the AM. I checked coumadin clinic note from earlier today which included these instructions.   [name] informed me that [name] was not showing any signs of bleeding. I reiterated that if he does start to have bloody/dark stools, blood in his urine, any other signs of bleeding or if he is to have a fall this evening, she should take him to the ER immediately. I encouraged her to follow the instructions given to her by the coumadin clinic today, and to contact the clinic if she does not receive a phone call by noon tomorrow.   [name], PA-C 02/27/2022 5:46 PM

## 2022-02-28 LAB — PROTIME-INR
INR: >10 (ref 0.9–1.2)
Prothrombin Time: 90.9 s — ABNORMAL HIGH (ref 9.1–12.0)

## 2022-02-28 NOTE — Telephone Encounter (Signed)
Called pt and spoke with pt's wife, Leda Gauze. Provided Warfarin dosing instructions. Please refer to anticoagulation encounter.

## 2022-03-04 ENCOUNTER — Other Ambulatory Visit: Payer: Self-pay | Admitting: Cardiology

## 2022-03-04 DIAGNOSIS — I480 Paroxysmal atrial fibrillation: Secondary | ICD-10-CM

## 2022-03-05 NOTE — Telephone Encounter (Signed)
Prescription refill request received for warfarin Lov: 01/22/22 ([facility])  Next INR check: 03/06/22 Warfarin tablet strength: 2.'5mg'$   Appropriate dose and refill sent to requested pharmacy.

## 2022-03-06 ENCOUNTER — Ambulatory Visit (INDEPENDENT_AMBULATORY_CARE_PROVIDER_SITE_OTHER): Payer: Medicare Other

## 2022-03-06 ENCOUNTER — Ambulatory Visit: Payer: Medicare Other | Attending: Cardiology

## 2022-03-06 DIAGNOSIS — I350 Nonrheumatic aortic (valve) stenosis: Secondary | ICD-10-CM | POA: Diagnosis not present

## 2022-03-06 DIAGNOSIS — I480 Paroxysmal atrial fibrillation: Secondary | ICD-10-CM

## 2022-03-06 DIAGNOSIS — Z7901 Long term (current) use of anticoagulants: Secondary | ICD-10-CM | POA: Insufficient documentation

## 2022-03-06 DIAGNOSIS — I4891 Unspecified atrial fibrillation: Secondary | ICD-10-CM | POA: Diagnosis not present

## 2022-03-06 DIAGNOSIS — I2510 Atherosclerotic heart disease of native coronary artery without angina pectoris: Secondary | ICD-10-CM | POA: Diagnosis not present

## 2022-03-06 DIAGNOSIS — I2584 Coronary atherosclerosis due to calcified coronary lesion: Secondary | ICD-10-CM | POA: Insufficient documentation

## 2022-03-06 LAB — ECHOCARDIOGRAM COMPLETE
AR max vel: 1.3 cm2
AV Area VTI: 1.25 cm2
AV Area mean vel: 1.31 cm2
AV Mean grad: 14.3 mmHg
AV Peak grad: 27.9 mmHg
Ao pk vel: 2.64 m/s
Area-P 1/2: 4.17 cm2
P 1/2 time: 702 msec
S' Lateral: 3.3 cm

## 2022-03-06 LAB — POCT INR: INR: 1.2 — AB (ref 2.0–3.0)

## 2022-03-06 NOTE — Patient Instructions (Signed)
Description   Take 3 tablets today and 3 tablets tomorrow and then resume warfarin 2.5 tablets daily Recheck INR in 1 week  Stay consistent with greens (2-3 times per week)  Stay consistent with protein drinks (1-2 per day)

## 2022-03-13 ENCOUNTER — Ambulatory Visit: Payer: Medicare Other | Attending: Cardiology

## 2022-03-13 DIAGNOSIS — Z7901 Long term (current) use of anticoagulants: Secondary | ICD-10-CM

## 2022-03-13 DIAGNOSIS — I480 Paroxysmal atrial fibrillation: Secondary | ICD-10-CM | POA: Diagnosis not present

## 2022-03-13 DIAGNOSIS — I4891 Unspecified atrial fibrillation: Secondary | ICD-10-CM

## 2022-03-13 LAB — POCT INR: INR: 4 — AB (ref 2.0–3.0)

## 2022-03-13 NOTE — Patient Instructions (Signed)
Description   Hold today's dose and eat greens and then resume warfarin 2.5 tablets daily Recheck INR in 1 week  Stay consistent with greens (3 times per week)  Stay consistent with protein drinks (2 per day)

## 2022-03-20 ENCOUNTER — Ambulatory Visit: Payer: Medicare Other | Attending: Cardiology

## 2022-03-20 DIAGNOSIS — I4891 Unspecified atrial fibrillation: Secondary | ICD-10-CM

## 2022-03-20 DIAGNOSIS — I480 Paroxysmal atrial fibrillation: Secondary | ICD-10-CM | POA: Diagnosis not present

## 2022-03-20 DIAGNOSIS — Z7901 Long term (current) use of anticoagulants: Secondary | ICD-10-CM | POA: Diagnosis not present

## 2022-03-20 LAB — POCT INR: INR: 2 (ref 2.0–3.0)

## 2022-03-20 NOTE — Patient Instructions (Signed)
Description   Continue taking Warfarin 2.5 tablets daily Recheck INR in 1 week  Stay consistent with greens (3 times per week)  Stay consistent with protein drinks (2 per day)

## 2022-03-23 ENCOUNTER — Other Ambulatory Visit: Payer: Self-pay

## 2022-03-26 ENCOUNTER — Ambulatory Visit: Payer: Medicare Other | Admitting: Cardiology

## 2022-03-26 ENCOUNTER — Encounter: Payer: Self-pay | Admitting: Cardiology

## 2022-03-26 ENCOUNTER — Ambulatory Visit: Payer: Medicare Other | Attending: Cardiology | Admitting: Cardiology

## 2022-03-26 VITALS — BP 148/82 | HR 46 | Ht 69.0 in | Wt 215.4 lb

## 2022-03-26 DIAGNOSIS — I2584 Coronary atherosclerosis due to calcified coronary lesion: Secondary | ICD-10-CM | POA: Insufficient documentation

## 2022-03-26 DIAGNOSIS — I480 Paroxysmal atrial fibrillation: Secondary | ICD-10-CM | POA: Diagnosis not present

## 2022-03-26 DIAGNOSIS — I10 Essential (primary) hypertension: Secondary | ICD-10-CM | POA: Insufficient documentation

## 2022-03-26 DIAGNOSIS — I2510 Atherosclerotic heart disease of native coronary artery without angina pectoris: Secondary | ICD-10-CM | POA: Insufficient documentation

## 2022-03-26 DIAGNOSIS — E7800 Pure hypercholesterolemia, unspecified: Secondary | ICD-10-CM

## 2022-03-26 NOTE — Progress Notes (Unsigned)
Cardiology Office Note:    Date:  03/26/2022   ID:  [name], DOB 17-Feb-1941, MRN [id number]  PCP:  Jorge Hipps, MD  Cardiologist:  Jorge Lindau, MD   Referring MD: Jorge Hipps, MD    ASSESSMENT:    1. Coronary artery calcification   2. Essential hypertension   3. Hypercholesteremia   4. Paroxysmal atrial fibrillation (HCC)    PLAN:    In order of problems listed above:  Coronary artery calcification: Secondary prevention stressed with the patient.  Importance of compliance with diet and medication stressed and he vocalized understanding. Paroxysmal atrial fibrillation:I discussed with the patient atrial fibrillation, disease process. Management and therapy including rate and rhythm control, anticoagulation benefits and potential risks were discussed extensively with the patient. Patient had multiple questions which were answered to patient's satisfaction.  Patient cannot afford newer anticoagulant medications. Essential hypertension: Blood pressure stable and diet was emphasized. Mixed dyslipidemia: On lipid-lowering medications followed by primary care. Mild aortic stenosis: Medical management.  We will continue to monitor. Renal sufficiency: Stable and managed by primary care. [place] Referral for Left Atrial Appendage Closure with Non-Valvular Atrial Fibrillation   [name] is a 81 y.o. male is being referred to the [facility] Team for evaluation for Left Atrial Appendage Closure with Watchman device for the management of stroke risk resulting form non-valvular atrial fibrillation.    Base upon [name]'s history, he is felt to be a poor candidate for long-term anticoagulation because of documented poor compliance with anticoagulant therapy.  The patient has a HAS-BLED score of   indicating a Yearly Major Bleeding Risk of  %.  {Click here to update HAS-BLED Score:1}    His CHADS2-VASc Score is   with an unadjusted  Ischemic Stroke Rate (% per year) of  %.  { Click here to update GBTDV7-OHYW Score:1}  His stroke risk necessitates a strategy of stroke prevention with either long-term oral anticoagulation or left atrial appendage occlusion therapy. We have discussed their bleeding risk in the context of their comorbid medical problems, as well as the rationale for referral for evaluation of Watchman left atrial appendage occlusion therapy. While the patient is at high long-term bleeding risk, they may be appropriate for short-term anticoagulation. Based on this individual patient's stroke and bleeding risk, a shared decision has been made to refer the patient for consideration of Watchman left atrial appendage closure utilizing the [employer] of Cardiology shared decision tool.  Essential hypertension: Blood pressure stable and diet was emphasized. Mixed dyslipidemia: On lipid-lowering medications followed by primary care. Mild aortic stenosis: Medical management.  We will continue to monitor. Renal sufficiency: Stable and managed by primary care.   Medication Adjustments/Labs and Tests Ordered: Current medicines are reviewed at length with the patient today.  Concerns regarding medicines are outlined above.  No orders of the defined types were placed in this encounter.  No orders of the defined types were placed in this encounter.    No chief complaint on file.    History of Present Illness:    [name] is a 81 y.o. male.  Patient has past medical history of paroxysmal atrial fibrillation, coronary artery calcification, hypercholesterolemia.  He has some degree of dementia.  His wife accompanies him for this visit today.  She tells me that he takes care of activities of daily living however smaller things he tends to forget.  Sometimes he misses taking  warfarin and sometimes she might double up on his dose.  She tries her best to take care of him.  At the time of my  evaluation, the patient is alert awake oriented and in no distress.  Past Medical History:  Diagnosis Date   Acquired hypothyroidism 08/17/2014   Acute MI, true posterior wall, subsequent episode of care ([place]) 08/15/2014   EF 44% with mildly reduced LV function  Formatting of this note might be different from the original. EF 44% with mildly reduced LV function   AKI (acute kidney injury) ([place]) 09/20/2014   Anxiety    Atrial fibrillation ([place]) 09/28/2019   ablation in past  Formatting of this note might be different from the original. ablation in past   Bipolar disorder [facility])    w/dementia/psychotic episodes   Bradycardia 09/17/2014   Cancer ([place]) 09/28/2019   Prostate Cancer; Throat Cancer  Formatting of this note might be different from the original. Prostate Cancer; Throat Cancer   Cardiac murmur 09/28/2019   Cataract    Cervical radicular pain 07/16/2019   Cervical spondylosis 06/19/2019   Chest pain with high risk for cardiac etiology 08/19/2014   Chronic depression    Chronic pain due to trauma 07/16/2019   [place] of toe 12/11/2018   Coronary artery calcification 07/25/2021   Coronary artery disease 08/10/2014   DDD (degenerative disc disease), cervical 09/29/2018   Degenerative lumbar disc    Dementia without behavioral disturbance ([place]) 07/16/2019   Disease characterized by destruction of skeletal muscle 08/15/2014   Essential hypertension 09/28/2019   Essential tremor 09/21/2014   History of MI (myocardial infarction) 09/14/2014   Hypercholesteremia    Hypertension    Hypotension 09/14/2014   Long term (current) use of anticoagulants 11/14/2021   Malignant neoplasm of prostate ([place])    Mild aortic stenosis 11/26/2019   Mood change 10/12/2019   Myocardial infarction ([place]) 08/10/2014   Nausea and vomiting 08/19/2014   Onychomycosis of left great toe 08/21/2014   Pain of fifth toe 12/11/2018   Paroxysmal atrial fibrillation ([place]) 07/25/2021   Peptic esophagitis    Peripheral neuropathy    Peripheral neuropathy  due to chemotherapy ([place]) 09/21/2014   Prolonged Q-T interval on ECG 08/19/2014   Prostate cancer ([place]) 09/28/2019   Reactive depression 10/12/2019   Renal insufficiency 08/17/2014   Right bundle branch block 09/28/2019   Sepsis ([place]) 05/18/2020   Small fiber neuropathy 12/16/2018   Squamous cell carcinoma of left tonsil (HCC)    Stage 3b chronic kidney disease ([place]) 12/20/2021   Throat cancer ([place])    Thyroid disease    Transaminitis 08/17/2014   Unstable angina ([place]) 09/14/2014   Whiplash injury syndrome, sequela 08/11/2018    Past Surgical History:  Procedure Laterality Date   CARDIAC ELECTROPHYSIOLOGY STUDY AND ABLATION     COLONOSCOPY  04/07/2007   Small colonic polyp status post polypectomy. Pancolonic diverticulosis predominantly in the sigmoid colon. Internal hemorrhoids.    ESOPHAGOGASTRODUODENOSCOPY  06/15/2010   Erosive esophagitis. Status post PEG placment   EYE SURGERY     INGUINAL HERNIA REPAIR     PROSTATE BIOPSY     SEBACEOUS CYST REMOVAL     TONSILLECTOMY      Current Medications: Current Meds  Medication Sig   Cholecalciferol (VITAMIN D3) 125 MCG (5000 UT) TABS Take 5,000 Units by mouth daily.   Docusate Sodium (STOOL SOFTENER LAXATIVE PO) Take 1 tablet by mouth as needed for constipation.   famotidine (PEPCID) 40 MG tablet Take 40  mg by mouth at bedtime.   furosemide (LASIX) 20 MG tablet Take 20 mg by mouth daily as needed for edema or fluid.   lamoTRIgine (LAMICTAL) 100 MG tablet Take 100 mg by mouth at bedtime.   levothyroxine (SYNTHROID) 125 MCG tablet Take 125 mcg by mouth daily.   LORazepam (ATIVAN) 2 MG tablet Take 1 mg by mouth 2 (two) times daily.   metoprolol tartrate (LOPRESSOR) 50 MG tablet Take 1 tablet (50 mg total) by mouth 2 (two) times daily.   Omega-3 Fatty Acids (FISH OIL) 1000 MG CPDR Take 2,000 mg by mouth 2 (two) times daily.   omeprazole (PRILOSEC) 40 MG capsule Take 40 mg by mouth 2 (two) times daily.   polyethylene glycol (MIRALAX / GLYCOLAX) 17 g  packet Take 17 g by mouth daily as needed for constipation.   QUEtiapine (SEROQUEL) 400 MG tablet Take 200 mg by mouth every evening.   rosuvastatin (CRESTOR) 10 MG tablet Take 10 mg by mouth daily.   warfarin (COUMADIN) 2.5 MG tablet TAKE 2 1/2 TABLETS (6.'25MG'$ ) DAILY AS DIRECTED BY COUMADIN CLINIC.     Allergies:   Trileptal [oxcarbazepine]   Social History   Socioeconomic History   Marital status: Married    Spouse name: Not on file   Number of children: 3   Years of education: 10th grade   Highest education level: Not on file  Occupational History   Occupation: Retired  Tobacco Use   Smoking status: Former    Types: Cigarettes    Quit date: 2011    Years since quitting: 12.7   Smokeless tobacco: Former  Scientific laboratory technician Use: Never used  Substance and Sexual Activity   Alcohol use: Not Currently   Drug use: Never   Sexual activity: Not on file  Other Topics Concern   Not on file  Social History Narrative   Lives at home with wife.   Right-handed.   2 cups of caffeine per day.   Social Determinants of Health   Financial Resource Strain: Not on file  Food Insecurity: Not on file  Transportation Needs: Not on file  Physical Activity: Not on file  Stress: Not on file  Social Connections: Not on file     Family History: The patient's family history includes Heart disease in his father; Other in his mother; Prostate cancer in his father; Stomach cancer in his father. There is no history of Colon cancer, Esophageal cancer, or Rectal cancer.  ROS:   Please see the history of present illness.    All other systems reviewed and are negative.  EKGs/Labs/Other Studies Reviewed:    The following studies were reviewed today: EKG reveals sinus rhythm and nonspecific ST-T changes   Recent Labs: 06/16/2021: TSH 3.870 07/25/2021: ALT 7; BUN 19; Creatinine, Ser 1.68; Potassium 4.7; Sodium 141  Recent Lipid Panel    Component Value Date/Time   CHOL 173 07/25/2021 0923    TRIG 261 (H) 07/25/2021 0923   HDL 44 07/25/2021 0923   CHOLHDL 3.9 07/25/2021 0923   LDLCALC 86 07/25/2021 0923    Physical Exam:    VS:  BP (!) 148/82   Pulse (!) 46   Ht '5\' 9"'$  (1.753 m)   Wt 215 lb 6.4 oz (97.7 kg)   SpO2 94%   BMI 31.81 kg/m     Wt Readings from Last 3 Encounters:  03/26/22 215 lb 6.4 oz (97.7 kg)  01/22/22 209 lb 9.6 oz (95.1 kg)  10/31/21 212  lb (96.2 kg)     GEN: Patient is in no acute distress HEENT: Normal NECK: No JVD; No carotid bruits LYMPHATICS: No lymphadenopathy CARDIAC: Hear sounds regular, 2/6 systolic murmur at the apex. RESPIRATORY:  Clear to auscultation without rales, wheezing or rhonchi  ABDOMEN: Soft, non-tender, non-distended MUSCULOSKELETAL:  No edema; No deformity  SKIN: Warm and dry NEUROLOGIC:  Alert and oriented x 3 PSYCHIATRIC:  Normal affect   Signed, Jorge Lindau, MD  03/26/2022 3:34 PM    [facility]

## 2022-03-26 NOTE — Patient Instructions (Addendum)

## 2022-03-28 ENCOUNTER — Telehealth: Payer: Self-pay

## 2022-03-28 NOTE — Telephone Encounter (Signed)
Per Dr. Geraldo Pitter, called patient to schedule Watchman consult.  Left message to call back.

## 2022-03-29 NOTE — Telephone Encounter (Signed)
Left message to call back  

## 2022-04-02 NOTE — Telephone Encounter (Signed)
Spoke with the patient's wife, Leda Gauze ([place]). Scheduled the patient for Watchman consult with Dr. Quentin Ore on 05/09/2022. She was grateful for call and agrees with plan.

## 2022-04-03 ENCOUNTER — Ambulatory Visit: Payer: Medicare Other | Attending: Cardiology

## 2022-04-03 DIAGNOSIS — Z7901 Long term (current) use of anticoagulants: Secondary | ICD-10-CM

## 2022-04-03 DIAGNOSIS — I4891 Unspecified atrial fibrillation: Secondary | ICD-10-CM | POA: Diagnosis not present

## 2022-04-03 DIAGNOSIS — I480 Paroxysmal atrial fibrillation: Secondary | ICD-10-CM

## 2022-04-03 LAB — POCT INR: INR: 3.3 — AB (ref 2.0–3.0)

## 2022-04-03 NOTE — Patient Instructions (Signed)
Description   Only take 1.5 tablet today and then continue taking Warfarin 2.5 tablets daily Recheck INR in 2 weeks  Stay consistent with greens (3 times per week)  Stay consistent with protein drinks (2 per day)

## 2022-04-04 DIAGNOSIS — Z23 Encounter for immunization: Secondary | ICD-10-CM | POA: Diagnosis not present

## 2022-04-04 DIAGNOSIS — R222 Localized swelling, mass and lump, trunk: Secondary | ICD-10-CM | POA: Diagnosis not present

## 2022-04-04 DIAGNOSIS — Z6830 Body mass index (BMI) 30.0-30.9, adult: Secondary | ICD-10-CM | POA: Diagnosis not present

## 2022-04-09 ENCOUNTER — Telehealth: Payer: Self-pay

## 2022-04-09 DIAGNOSIS — I4891 Unspecified atrial fibrillation: Secondary | ICD-10-CM

## 2022-04-09 DIAGNOSIS — J180 Bronchopneumonia, unspecified organism: Secondary | ICD-10-CM | POA: Diagnosis not present

## 2022-04-09 NOTE — Telephone Encounter (Addendum)
Received call from pt's wife stating pt was prescribed an abx (Levaquin) and was starting it today. Instructed for pt to only take Warfarin 2 tablets today, eat greens, and go to [facility] on Wednesday, 04/11/22 to have PT/INR redrawn. Pt's wife verbalized instructions. Lab order placed.

## 2022-04-11 ENCOUNTER — Ambulatory Visit (INDEPENDENT_AMBULATORY_CARE_PROVIDER_SITE_OTHER): Payer: Medicare Other

## 2022-04-11 DIAGNOSIS — Z7901 Long term (current) use of anticoagulants: Secondary | ICD-10-CM

## 2022-04-11 DIAGNOSIS — I4891 Unspecified atrial fibrillation: Secondary | ICD-10-CM | POA: Diagnosis not present

## 2022-04-11 LAB — PROTIME-INR
INR: 2.5 — ABNORMAL HIGH (ref 0.9–1.2)
Prothrombin Time: 24.6 s — ABNORMAL HIGH (ref 9.1–12.0)

## 2022-04-11 NOTE — Patient Instructions (Signed)
Description   Called and spoke with pt's wife. Instructed for pt to continue taking 2.5 tablets daily EXCEPT 2 tablets on Thursday and Saturday since pt is on abx.  Normal dose: Warfarin 2.5 tablets daily Recheck INR in 1 week at [facility] office.  Stay consistent with greens (3 times per week)  Stay consistent with protein drinks (2 per day)

## 2022-04-17 ENCOUNTER — Ambulatory Visit: Payer: Medicare Other | Attending: Cardiology

## 2022-04-17 DIAGNOSIS — I4891 Unspecified atrial fibrillation: Secondary | ICD-10-CM

## 2022-04-17 DIAGNOSIS — Z7901 Long term (current) use of anticoagulants: Secondary | ICD-10-CM

## 2022-04-17 DIAGNOSIS — I480 Paroxysmal atrial fibrillation: Secondary | ICD-10-CM

## 2022-04-17 LAB — POCT INR: INR: 2.4 (ref 2.0–3.0)

## 2022-04-17 NOTE — Patient Instructions (Signed)
Description   Resume taking 2.5 tablets daily  Recheck INR in 2 weeks at [facility] office.  Stay consistent with greens (3 times per week)  Stay consistent with protein drinks (2 per day)

## 2022-04-20 DIAGNOSIS — N1832 Chronic kidney disease, stage 3b: Secondary | ICD-10-CM | POA: Diagnosis not present

## 2022-04-23 ENCOUNTER — Telehealth: Payer: Self-pay

## 2022-04-23 NOTE — Telephone Encounter (Signed)
The patients wife called and said he needed a refill on his hydrocodone. I tried to explain that Dr. Holley Raring wouldn't call out meds, and she said Dr. Holley Raring told her due to her husbands age and the fact that they live in [place], that he could do a phone call visit. Is this ok to schedule a virtual?

## 2022-04-24 DIAGNOSIS — I480 Paroxysmal atrial fibrillation: Secondary | ICD-10-CM | POA: Diagnosis not present

## 2022-04-24 DIAGNOSIS — Z7901 Long term (current) use of anticoagulants: Secondary | ICD-10-CM | POA: Diagnosis not present

## 2022-04-24 DIAGNOSIS — T39395S Adverse effect of other nonsteroidal anti-inflammatory drugs [NSAID], sequela: Secondary | ICD-10-CM | POA: Diagnosis not present

## 2022-04-24 DIAGNOSIS — I129 Hypertensive chronic kidney disease with stage 1 through stage 4 chronic kidney disease, or unspecified chronic kidney disease: Secondary | ICD-10-CM | POA: Diagnosis not present

## 2022-04-24 DIAGNOSIS — N1832 Chronic kidney disease, stage 3b: Secondary | ICD-10-CM | POA: Diagnosis not present

## 2022-04-24 DIAGNOSIS — Z1331 Encounter for screening for depression: Secondary | ICD-10-CM | POA: Diagnosis not present

## 2022-04-24 DIAGNOSIS — Z87891 Personal history of nicotine dependence: Secondary | ICD-10-CM | POA: Diagnosis not present

## 2022-04-24 NOTE — Telephone Encounter (Signed)
Left vm letting them know to call to schedule appt.

## 2022-05-01 ENCOUNTER — Ambulatory Visit: Payer: Medicare Other | Attending: Cardiology

## 2022-05-01 ENCOUNTER — Other Ambulatory Visit: Payer: Self-pay | Admitting: Cardiology

## 2022-05-01 DIAGNOSIS — I4891 Unspecified atrial fibrillation: Secondary | ICD-10-CM

## 2022-05-01 DIAGNOSIS — Z7901 Long term (current) use of anticoagulants: Secondary | ICD-10-CM

## 2022-05-01 DIAGNOSIS — I480 Paroxysmal atrial fibrillation: Secondary | ICD-10-CM

## 2022-05-01 DIAGNOSIS — D171 Benign lipomatous neoplasm of skin and subcutaneous tissue of trunk: Secondary | ICD-10-CM | POA: Diagnosis not present

## 2022-05-01 LAB — POCT INR: INR: 5 — AB (ref 2.0–3.0)

## 2022-05-01 NOTE — Telephone Encounter (Signed)
Warfarin refill Last INR 04/17/2022 Last OV 03/26/2022

## 2022-05-01 NOTE — Patient Instructions (Addendum)
Description   HOLD today's dose and only take 1 tablet tomorrow . Then resume taking 2.5 tablets daily  Recheck INR in 1 week at [facility] office.  Stay consistent with greens (3 times per week)  Stay consistent with protein drinks (2 per day)

## 2022-05-04 DIAGNOSIS — R42 Dizziness and giddiness: Secondary | ICD-10-CM | POA: Diagnosis not present

## 2022-05-04 DIAGNOSIS — I959 Hypotension, unspecified: Secondary | ICD-10-CM | POA: Diagnosis not present

## 2022-05-04 DIAGNOSIS — Z7901 Long term (current) use of anticoagulants: Secondary | ICD-10-CM | POA: Diagnosis not present

## 2022-05-04 DIAGNOSIS — R110 Nausea: Secondary | ICD-10-CM | POA: Diagnosis not present

## 2022-05-04 DIAGNOSIS — I483 Typical atrial flutter: Secondary | ICD-10-CM | POA: Diagnosis not present

## 2022-05-04 DIAGNOSIS — N189 Chronic kidney disease, unspecified: Secondary | ICD-10-CM | POA: Diagnosis not present

## 2022-05-04 DIAGNOSIS — I951 Orthostatic hypotension: Secondary | ICD-10-CM | POA: Diagnosis not present

## 2022-05-04 DIAGNOSIS — R531 Weakness: Secondary | ICD-10-CM | POA: Diagnosis not present

## 2022-05-04 DIAGNOSIS — Z79899 Other long term (current) drug therapy: Secondary | ICD-10-CM | POA: Diagnosis not present

## 2022-05-04 DIAGNOSIS — R001 Bradycardia, unspecified: Secondary | ICD-10-CM | POA: Diagnosis not present

## 2022-05-04 DIAGNOSIS — I4891 Unspecified atrial fibrillation: Secondary | ICD-10-CM | POA: Diagnosis not present

## 2022-05-08 ENCOUNTER — Ambulatory Visit
Payer: Medicare Other | Attending: Student in an Organized Health Care Education/Training Program | Admitting: Student in an Organized Health Care Education/Training Program

## 2022-05-08 ENCOUNTER — Encounter: Payer: Self-pay | Admitting: Student in an Organized Health Care Education/Training Program

## 2022-05-08 ENCOUNTER — Ambulatory Visit: Payer: Medicare Other | Attending: Cardiology

## 2022-05-08 ENCOUNTER — Ambulatory Visit: Payer: Medicare Other

## 2022-05-08 VITALS — BP 154/89 | HR 70 | Temp 97.2°F | Ht 68.0 in | Wt 205.0 lb

## 2022-05-08 DIAGNOSIS — I4891 Unspecified atrial fibrillation: Secondary | ICD-10-CM | POA: Diagnosis not present

## 2022-05-08 DIAGNOSIS — G629 Polyneuropathy, unspecified: Secondary | ICD-10-CM | POA: Diagnosis not present

## 2022-05-08 DIAGNOSIS — G8921 Chronic pain due to trauma: Secondary | ICD-10-CM | POA: Insufficient documentation

## 2022-05-08 DIAGNOSIS — I480 Paroxysmal atrial fibrillation: Secondary | ICD-10-CM | POA: Diagnosis not present

## 2022-05-08 DIAGNOSIS — M5412 Radiculopathy, cervical region: Secondary | ICD-10-CM | POA: Diagnosis not present

## 2022-05-08 DIAGNOSIS — Z7901 Long term (current) use of anticoagulants: Secondary | ICD-10-CM | POA: Diagnosis not present

## 2022-05-08 DIAGNOSIS — M5030 Other cervical disc degeneration, unspecified cervical region: Secondary | ICD-10-CM | POA: Diagnosis not present

## 2022-05-08 LAB — POCT INR: INR: 1.9 — AB (ref 2.0–3.0)

## 2022-05-08 MED ORDER — HYDROCODONE-ACETAMINOPHEN 10-325 MG PO TABS: 1.0000 | ORAL_TABLET | Freq: Four times a day (QID) | ORAL | 0 refills | Status: AC | PRN

## 2022-05-08 NOTE — Progress Notes (Unsigned)
Electrophysiology Office Note:    Date:  05/09/2022   ID:  [name], DOB [birth year], MRN [id number]  PCP:  [name], MD  [facility] Cardiologist:  None  [facility] Electrophysiologist:  Vickie Epley, MD   Referring MD: [name], MD   Chief Complaint: AF  History of Present Illness:    [name] is a 81 y.o. male who presents for an evaluation of AF at the request of Dr Geraldo Pitter. Their medical history includes CAD, pAF, HLD, HTN. The patient last saw Dr Geraldo Pitter 03/26/2022. He has experienced difficulties with medication regimen compliance with frequent missed doses of his [place]. He has a history of prior AF ablation. He is currently taking coumadin with difficult to control INR (1.9-5 in the past month). He is unable to afford NOAC.He is in clinic today with his family.     Past Medical History:  Diagnosis Date   Acquired hypothyroidism 08/17/2014   Acute MI, true posterior wall, subsequent episode of care ([place]) 08/15/2014   EF 44% with mildly reduced LV function  Formatting of this note might be different from the original. EF 44% with mildly reduced LV function   AKI (acute kidney injury) ([place]) 09/20/2014   Anxiety    Atrial fibrillation ([place]) 09/28/2019   ablation in past  Formatting of this note might be different from the original. ablation in past   Bipolar disorder [facility])    w/dementia/psychotic episodes   Bradycardia 09/17/2014   Cancer ([place]) 09/28/2019   Prostate Cancer; Throat Cancer  Formatting of this note might be different from the original. Prostate Cancer; Throat Cancer   Cardiac murmur 09/28/2019   Cataract    Cervical radicular pain 07/16/2019   Cervical spondylosis 06/19/2019   Chest pain with high risk for cardiac etiology 08/19/2014   Chronic depression    Chronic pain due to trauma 07/16/2019   [place] of toe 12/11/2018   Coronary artery calcification 07/25/2021   Coronary artery disease 08/10/2014   DDD (degenerative disc  disease), cervical 09/29/2018   Degenerative lumbar disc    Dementia without behavioral disturbance ([place]) 07/16/2019   Disease characterized by destruction of skeletal muscle 08/15/2014   Essential hypertension 09/28/2019   Essential tremor 09/21/2014   History of MI (myocardial infarction) 09/14/2014   Hypercholesteremia    Hypertension    Hypotension 09/14/2014   Long term (current) use of anticoagulants 11/14/2021   Malignant neoplasm of prostate ([place])    Mild aortic stenosis 11/26/2019   Mood change 10/12/2019   Myocardial infarction ([place]) 08/10/2014   Nausea and vomiting 08/19/2014   Onychomycosis of left great toe 08/21/2014   Pain of fifth toe 12/11/2018   Paroxysmal atrial fibrillation ([place]) 07/25/2021   Peptic esophagitis    Peripheral neuropathy    Peripheral neuropathy due to chemotherapy ([place]) 09/21/2014   Prolonged Q-T interval on ECG 08/19/2014   Prostate cancer ([place]) 09/28/2019   Reactive depression 10/12/2019   Renal insufficiency 08/17/2014   Right bundle branch block 09/28/2019   Sepsis ([place]) 05/18/2020   Small fiber neuropathy 12/16/2018   Squamous cell carcinoma of left tonsil (HCC)    Stage 3b chronic kidney disease ([place]) 12/20/2021   Throat cancer ([place])    Thyroid disease    Transaminitis 08/17/2014   Unstable angina ([place]) 09/14/2014   Whiplash injury syndrome, sequela 08/11/2018    Past Surgical History:  Procedure Laterality Date   CARDIAC ELECTROPHYSIOLOGY STUDY AND ABLATION  COLONOSCOPY  04/07/2007   Small colonic polyp status post polypectomy. Pancolonic diverticulosis predominantly in the sigmoid colon. Internal hemorrhoids.    ESOPHAGOGASTRODUODENOSCOPY  06/15/2010   Erosive esophagitis. Status post PEG placment   EYE SURGERY     INGUINAL HERNIA REPAIR     PROSTATE BIOPSY     SEBACEOUS CYST REMOVAL     TONSILLECTOMY      Current Medications: Current Meds  Medication Sig   albuterol (PROVENTIL) (2.5 MG/3ML) 0.083% nebulizer solution Take 2.5 mg by nebulization every 6  (six) hours as needed for wheezing or shortness of breath.   Cholecalciferol (VITAMIN D3) 125 MCG (5000 UT) TABS Take 5,000 Units by mouth daily.   Docusate Sodium (STOOL SOFTENER LAXATIVE PO) Take 1 tablet by mouth as needed for constipation.   famotidine (PEPCID) 40 MG tablet Take 40 mg by mouth at bedtime.   furosemide (LASIX) 20 MG tablet Take 20 mg by mouth daily as needed for edema or fluid.   HYDROcodone-acetaminophen (NORCO) 10-325 MG tablet Take 1 tablet by mouth every 6 (six) hours as needed for severe pain. Must last 30 days.   lamoTRIgine (LAMICTAL) 100 MG tablet Take 100 mg by mouth at bedtime.   levothyroxine (SYNTHROID) 125 MCG tablet Take 125 mcg by mouth daily.   LORazepam (ATIVAN) 2 MG tablet Take 1 mg by mouth 2 (two) times daily.   metoprolol tartrate (LOPRESSOR) 50 MG tablet Take 1 tablet (50 mg total) by mouth 2 (two) times daily.   Omega-3 Fatty Acids (FISH OIL) 1000 MG CPDR Take 2,000 mg by mouth 2 (two) times daily.   omeprazole (PRILOSEC) 40 MG capsule Take 40 mg by mouth 2 (two) times daily.   polyethylene glycol (MIRALAX / GLYCOLAX) 17 g packet Take 17 g by mouth daily as needed for constipation.   QUEtiapine (SEROQUEL) 400 MG tablet Take 200 mg by mouth every evening.   rosuvastatin (CRESTOR) 10 MG tablet Take 10 mg by mouth daily.   warfarin (COUMADIN) 2.5 MG tablet TAKE 2 1/2 TABLETS (6.'25MG'$ ) DAILY AS DIRECTED BY COUMADIN CLINIC.     Allergies:   Trileptal [oxcarbazepine]   Social History   Socioeconomic History   Marital status: Married    Spouse name: Not on file   Number of children: 3   Years of education: 10th grade   Highest education level: Not on file  Occupational History   Occupation: Retired  Tobacco Use   Smoking status: Former    Types: Cigarettes    Quit date: 2011    Years since quitting: 12.9   Smokeless tobacco: Former  Scientific laboratory technician Use: Never used  Substance and Sexual Activity   Alcohol use: Not Currently   Drug use:  Never   Sexual activity: Not on file  Other Topics Concern   Not on file  Social History Narrative   Lives at home with wife.   Right-handed.   2 cups of caffeine per day.   Social Determinants of Health   Financial Resource Strain: Not on file  Food Insecurity: Not on file  Transportation Needs: Not on file  Physical Activity: Not on file  Stress: Not on file  Social Connections: Not on file     Family History: The patient's family history includes Heart disease in his father; Other in his mother; Prostate cancer in his father; Stomach cancer in his father. There is no history of Colon cancer, Esophageal cancer, or Rectal cancer.  ROS:   Please see  the history of present illness.    All other systems reviewed and are negative.  EKGs/Labs/Other Studies Reviewed:    The following studies were reviewed today:  03/06/2022 echo - EF 60, RV normal, LA dilated     Recent Labs: 06/16/2021: TSH 3.870 07/25/2021: ALT 7; BUN 19; Creatinine, Ser 1.68; Potassium 4.7; Sodium 141  Recent Lipid Panel    Component Value Date/Time   CHOL 173 07/25/2021 0923   TRIG 261 (H) 07/25/2021 0923   HDL 44 07/25/2021 0923   CHOLHDL 3.9 07/25/2021 0923   LDLCALC 86 07/25/2021 0923    Physical Exam:    VS:  BP 128/78   Pulse 65   Ht '5\' 8"'$  (1.727 m)   Wt 211 lb (95.7 kg)   SpO2 95%   BMI 32.08 kg/m     Wt Readings from Last 3 Encounters:  05/09/22 211 lb (95.7 kg)  05/08/22 205 lb (93 kg)  03/26/22 215 lb 6.4 oz (97.7 kg)     GEN: [place]. Well nourished, well developed in no acute distress HEENT: Normal NECK: No JVD; No carotid bruits LYMPHATICS: No lymphadenopathy CARDIAC: RRR, no murmurs, rubs, gallops RESPIRATORY:  Clear to auscultation without rales, wheezing or rhonchi  ABDOMEN: Soft, non-tender, non-distended MUSCULOSKELETAL:  No edema; No deformity  SKIN: Warm and dry NEUROLOGIC:  Alert and oriented x 3 PSYCHIATRIC:  Normal affect       ASSESSMENT:    1. Atrial  fibrillation, unspecified type ([place])   2. Essential hypertension    PLAN:    In order of problems listed above:   #Atrial fibrillation S/p prior ablation. Unable to consistently afford Tre[place]. He and his family are interested in pursuing [place] as a stroke risk mitigation strategy that avoids long term use of [place]. I have discussed the procedure in detail including the risks and they wish to proceed.  After Watchman implant, consider starting amiodarone for rhythm control.  ----------------  I have seen [name] in the office today who is being considered for a Watchman left atrial appendage closure device. I believe they will benefit from this procedure given their history of atrial fibrillation, CHA2DS2-VASc score of 3 and unadjusted ischemic stroke rate of 3.2% per year. Unfortunately, the patient is not felt to be a long term anticoagulation candidate secondary to difficult to control INR and inability to afford NOAC. The patient's chart has been reviewed and I feel that they would be a candidate for short term oral anticoagulation after Watchman implant.   It is my belief that after undergoing a LAA closure procedure, [name] will not need long term anticoagulation which eliminates anticoagulation side effects and major bleeding risk.   Procedural risks for the Watchman implant have been reviewed with the patient including a 0.5% risk of stroke, <1% risk of perforation and <1% risk of device embolization. Other risks include bleeding, vascular damage, tamponade, worsening renal function, and death. The patient understands these risk and wishes to proceed.     The published clinical data on the safety and effectiveness of WATCHMAN include but are not limited to the following: - Holmes DR, Mechele Claude, Sick P et al. for the PROTECT AF Investigators. Percutaneous closure of the left atrial appendage versus warfarin therapy for prevention of stroke in patients with atrial  fibrillation: a randomised non-inferiority trial. Lancet 2009; 374: 534-42. Mechele Claude, Doshi SK, Abelardo Diesel D et al. on behalf of the PROTECT AF Investigators. Percutaneous Left Atrial  Appendage Closure for Stroke Prophylaxis in Patients With Atrial Fibrillation 2.3-Year Follow-up of the PROTECT AF (Watchman Left Atrial Appendage System for Embolic Protection in Patients With Atrial Fibrillation) Trial. Circulation 2013; 127:720-729. - Alli O, Doshi S,  Kar S, Reddy VY, Sievert H et al. Quality of Life Assessment in the Randomized PROTECT AF (Percutaneous Closure of the Left Atrial Appendage Versus Warfarin Therapy for Prevention of Stroke in Patients With Atrial Fibrillation) Trial of Patients at Risk for Stroke With Nonvalvular Atrial Fibrillation. J Am Coll Cardiol 2013; 76:7209-4. Vertell Limber DR, Tarri Abernethy, Price M, [place], Sievert H, Doshi S, Huber K, Reddy V. Prospective randomized evaluation of the Watchman left atrial appendage Device in patients with atrial fibrillation versus long-term warfarin therapy; the PREVAIL trial. Journal of the [employer] of Cardiology, Vol. 4, No. 1, 2014, 1-11. - Kar S, Doshi SK, Sadhu A, Horton R, Osorio J et al. Primary outcome evaluation of a next-generation left atrial appendage closure device: results from the PINNACLE FLX trial. Circulation 2021;143(18)1754-1762.    After today's visit with the patient which was dedicated solely for shared decision making visit regarding LAA closure device, the patient decided to proceed with the LAA appendage closure procedure scheduled to be done in the near future at [facility]. Prior to the procedure, I would like to obtain a gated CT scan of the chest with contrast timed for PV/LA visualization.    HAS-BLED score 2 Hypertension Yes  Abnormal renal and liver function (Dialysis, transplant, Cr >2.26 mg/dL /Cirrhosis or Bilirubin >2x Normal or AST/ALT/AP >3x Normal) No  Stroke No  Bleeding No  Labile  INR (Unstable/high INR) No  Elderly (>65) Yes  Drugs or alcohol (? 8 drinks/week, anti-plt or NSAID) No   CHA2DS2-VASc Score = 3  The patient's score is based upon: CHF History: 0 HTN History: 1 Diabetes History: 0 Stroke History: 0 Vascular Disease History: 0 Age Score: 2 Gender Score: 0      Medication Adjustments/Labs and Tests Ordered: Current medicines are reviewed at length with the patient today.  Concerns regarding medicines are outlined above.  Orders Placed This Encounter  Procedures   CT CARDIAC MORPH/PULM VEIN W/CM&W/O CA SCORE   Basic metabolic panel   No orders of the defined types were placed in this encounter.    Signed, Hilton Cork. Quentin Ore, MD, [facility], [facility] 05/09/2022 11:29 PM    Electrophysiology [facility]

## 2022-05-08 NOTE — Patient Instructions (Signed)
Description   Take 3 tablets today and then resume taking 2.5 tablets daily  Recheck INR in 2 weeks at [facility] office.  Stay consistent with greens (3 times per week)  Stay consistent with protein drinks (2 per day)

## 2022-05-08 NOTE — Progress Notes (Signed)
Nursing Pain Medication Assessment:  Safety precautions to be maintained throughout the outpatient stay will include: orient to surroundings, keep bed in low position, maintain call bell within reach at all times, provide assistance with transfer out of bed and ambulation.  Medication Inspection Compliance: Pill count conducted under aseptic conditions, in front of the patient. Neither the pills nor the bottle was removed from the patient's sight at any time. Once count was completed pills were immediately returned to the patient in their original bottle.  Medication: Hydrocodone/APAP Pill/Patch Count:  5 of 120 pills remain Pill/Patch Appearance: Markings consistent with prescribed medication Bottle Appearance: Standard pharmacy container. Clearly labeled. Filled Date: 5 / 23 / 2023 Last Medication intake:  Day before yesterdaySafety precautions to be maintained throughout the outpatient stay will include: orient to surroundings, keep bed in low position, maintain call bell within reach at all times, provide assistance with transfer out of bed and ambulation.
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
## 2022-05-09 ENCOUNTER — Ambulatory Visit: Payer: Medicare Other | Attending: Cardiology | Admitting: Cardiology

## 2022-05-09 ENCOUNTER — Encounter: Payer: Self-pay | Admitting: Cardiology

## 2022-05-09 VITALS — BP 128/78 | HR 65 | Ht 68.0 in | Wt 211.0 lb

## 2022-05-09 DIAGNOSIS — I10 Essential (primary) hypertension: Secondary | ICD-10-CM | POA: Diagnosis not present

## 2022-05-09 DIAGNOSIS — I4891 Unspecified atrial fibrillation: Secondary | ICD-10-CM | POA: Insufficient documentation

## 2022-05-09 NOTE — Patient Instructions (Signed)
Medication Instructions:  Your physician recommends that you continue on your current medications as directed. Please refer to the Current Medication list given to you today.  *If you need a refill on your cardiac medications before your next appointment, please call your pharmacy*  Lab Work: TODAY: BMET If you have labs (blood work) drawn today and your tests are completely normal, you will receive your results only by: [place] (if you have MyChart) OR A paper copy in the mail If you have any lab test that is abnormal or we need to change your treatment, we will call you to review the results.  Testing/Procedures: Your physician has requested that you have cardiac CT. Cardiac computed tomography (CT) is a painless test that uses an x-ray machine to take clear, detailed pictures of your heart. For further information please visit [email or web address]. Please follow instruction sheet as given.  Follow-Up: At [facility], you and your health needs are our priority.  As part of our continuing mission to provide you with exceptional heart care, we have created designated Provider Care Teams.  These Care Teams include your primary Cardiologist (physician) and Advanced Practice Providers (APPs -  Physician Assistants and Nurse Practitioners) who all work together to provide you with the care you need, when you need it.  Your next appointment:   We will call you to schedule follow up  Important Information About Sugar

## 2022-05-10 LAB — BASIC METABOLIC PANEL
BUN/Creatinine Ratio: 10 (ref 10–24)
BUN: 17 mg/dL (ref 8–27)
CO2: 24 mmol/L (ref 20–29)
Calcium: 9 mg/dL (ref 8.6–10.2)
Chloride: 106 mmol/L (ref 96–106)
Creatinine, Ser: 1.62 mg/dL — ABNORMAL HIGH (ref 0.76–1.27)
Glucose: 101 mg/dL — ABNORMAL HIGH (ref 70–99)
Potassium: 4.5 mmol/L (ref 3.5–5.2)
Sodium: 143 mmol/L (ref 134–144)
eGFR: 43 mL/min/{1.73_m2} — ABNORMAL LOW (ref >59)

## 2022-05-15 DIAGNOSIS — F314 Bipolar disorder, current episode depressed, severe, without psychotic features: Secondary | ICD-10-CM | POA: Diagnosis not present

## 2022-05-15 DIAGNOSIS — F411 Generalized anxiety disorder: Secondary | ICD-10-CM | POA: Diagnosis not present

## 2022-05-22 ENCOUNTER — Ambulatory Visit: Payer: Medicare Other | Attending: Cardiology

## 2022-05-22 DIAGNOSIS — I4891 Unspecified atrial fibrillation: Secondary | ICD-10-CM

## 2022-05-22 DIAGNOSIS — Z7901 Long term (current) use of anticoagulants: Secondary | ICD-10-CM | POA: Diagnosis not present

## 2022-05-22 DIAGNOSIS — I480 Paroxysmal atrial fibrillation: Secondary | ICD-10-CM | POA: Diagnosis not present

## 2022-05-22 LAB — POCT INR: INR: 3.4 — AB (ref 2.0–3.0)

## 2022-05-22 NOTE — Patient Instructions (Signed)
Description   HOLD today's dose and then resume taking 2.5 tablets daily  Recheck INR in 3 weeks at [facility] office.  Stay consistent with greens (3 times per week)  Stay consistent with protein drinks (2 per day)

## 2022-05-28 ENCOUNTER — Telehealth (HOSPITAL_COMMUNITY): Payer: Self-pay | Admitting: Emergency Medicine

## 2022-05-28 NOTE — Telephone Encounter (Signed)
Reaching out to patient to offer assistance regarding upcoming cardiac imaging study; pt verbalizes understanding of appt date/time, parking situation and where to check in, pre-test NPO status and medications ordered, and verified current allergies; name and call back number provided for further questions should they arise Marchia Bond RN Navigator Cardiac Imaging [facility] [phone] office [phone] cell  Arrival 200 Daily meds except lasix Denies iv issues Aware contrast

## 2022-05-30 ENCOUNTER — Ambulatory Visit (HOSPITAL_COMMUNITY)
Admission: RE | Admit: 2022-05-30 | Discharge: 2022-05-30 | Disposition: A | Discharge status: Home / Self Care | Payer: Medicare Other | Source: Ambulatory Visit | Attending: Cardiology | Admitting: Cardiology

## 2022-05-30 DIAGNOSIS — I4891 Unspecified atrial fibrillation: Secondary | ICD-10-CM | POA: Insufficient documentation

## 2022-05-30 DIAGNOSIS — I10 Essential (primary) hypertension: Secondary | ICD-10-CM | POA: Insufficient documentation

## 2022-05-30 MED ORDER — IOHEXOL 350 MG/ML SOLN
100.0000 mL | Freq: Once | INTRAVENOUS | Status: AC | PRN
  Administered 2022-05-30: 100 mL via INTRAVENOUS

## 2022-06-12 ENCOUNTER — Ambulatory Visit: Payer: Medicare Other | Attending: Cardiology

## 2022-06-12 DIAGNOSIS — I480 Paroxysmal atrial fibrillation: Secondary | ICD-10-CM

## 2022-06-12 DIAGNOSIS — I4891 Unspecified atrial fibrillation: Secondary | ICD-10-CM

## 2022-06-12 DIAGNOSIS — Z7901 Long term (current) use of anticoagulants: Secondary | ICD-10-CM | POA: Diagnosis not present

## 2022-06-12 LAB — POCT INR: INR: 2.3 (ref 2.0–3.0)

## 2022-06-12 NOTE — Patient Instructions (Signed)
Description   Continue taking 2.5 tablets daily  Recheck INR in 4 weeks at [facility] office.  Stay consistent with greens (2 times per week)  Stay consistent with protein drinks (2 per day)

## 2022-06-14 ENCOUNTER — Other Ambulatory Visit: Payer: Self-pay

## 2022-06-14 ENCOUNTER — Telehealth: Payer: Self-pay

## 2022-06-14 DIAGNOSIS — I480 Paroxysmal atrial fibrillation: Secondary | ICD-10-CM

## 2022-06-14 NOTE — Telephone Encounter (Signed)
Spoke with the patient's wife (DPR).  Scheduled the patient for LAAO on 08/30/2022. She understands weekly INR checks will be completed 4 weeks prior to procedure.  Scheduled the patient for pre-procedure visit 08/22/22 with Kathyrn Drown. Ms. Partin was grateful for call and agreed with plan.

## 2022-06-14 NOTE — Telephone Encounter (Signed)
-----   Message from Vickie Epley, MD sent at 06/13/2022  5:39 PM EST ----- OK to proceed with watchman evaluation.  Lysbeth Galas T. Quentin Ore, MD, [facility], [facility] Cardiac Electrophysiology

## 2022-06-20 ENCOUNTER — Telehealth: Payer: Self-pay

## 2022-07-04 ENCOUNTER — Other Ambulatory Visit: Payer: Self-pay | Admitting: Cardiology

## 2022-07-05 ENCOUNTER — Emergency Department (HOSPITAL_COMMUNITY): Payer: Medicare Other

## 2022-07-05 ENCOUNTER — Observation Stay (HOSPITAL_COMMUNITY): Payer: Medicare Other

## 2022-07-05 ENCOUNTER — Other Ambulatory Visit: Payer: Self-pay

## 2022-07-05 ENCOUNTER — Encounter (HOSPITAL_COMMUNITY): Payer: Self-pay

## 2022-07-05 ENCOUNTER — Inpatient Hospital Stay (HOSPITAL_COMMUNITY)
Admission: EM | Admit: 2022-07-05 | Discharge: 2022-07-08 | DRG: 070 | Disposition: A | Discharge status: Home Health | Payer: Medicare Other | Attending: Infectious Diseases | Admitting: Infectious Diseases

## 2022-07-05 DIAGNOSIS — Z931 Gastrostomy status: Secondary | ICD-10-CM

## 2022-07-05 DIAGNOSIS — Z923 Personal history of irradiation: Secondary | ICD-10-CM

## 2022-07-05 DIAGNOSIS — I480 Paroxysmal atrial fibrillation: Secondary | ICD-10-CM | POA: Diagnosis not present

## 2022-07-05 DIAGNOSIS — I4891 Unspecified atrial fibrillation: Secondary | ICD-10-CM | POA: Diagnosis not present

## 2022-07-05 DIAGNOSIS — E7800 Pure hypercholesterolemia, unspecified: Secondary | ICD-10-CM | POA: Diagnosis present

## 2022-07-05 DIAGNOSIS — Z66 Do not resuscitate: Secondary | ICD-10-CM | POA: Diagnosis present

## 2022-07-05 DIAGNOSIS — I214 Non-ST elevation (NSTEMI) myocardial infarction: Secondary | ICD-10-CM

## 2022-07-05 DIAGNOSIS — E440 Moderate protein-calorie malnutrition: Secondary | ICD-10-CM | POA: Diagnosis not present

## 2022-07-05 DIAGNOSIS — I130 Hypertensive heart and chronic kidney disease with heart failure and stage 1 through stage 4 chronic kidney disease, or unspecified chronic kidney disease: Secondary | ICD-10-CM | POA: Diagnosis not present

## 2022-07-05 DIAGNOSIS — F319 Bipolar disorder, unspecified: Secondary | ICD-10-CM | POA: Diagnosis not present

## 2022-07-05 DIAGNOSIS — J9601 Acute respiratory failure with hypoxia: Secondary | ICD-10-CM | POA: Diagnosis present

## 2022-07-05 DIAGNOSIS — D61818 Other pancytopenia: Secondary | ICD-10-CM | POA: Diagnosis not present

## 2022-07-05 DIAGNOSIS — Z86010 Personal history of colonic polyps: Secondary | ICD-10-CM

## 2022-07-05 DIAGNOSIS — I252 Old myocardial infarction: Secondary | ICD-10-CM

## 2022-07-05 DIAGNOSIS — M4312 Spondylolisthesis, cervical region: Secondary | ICD-10-CM | POA: Diagnosis not present

## 2022-07-05 DIAGNOSIS — E039 Hypothyroidism, unspecified: Secondary | ICD-10-CM | POA: Diagnosis present

## 2022-07-05 DIAGNOSIS — I4819 Other persistent atrial fibrillation: Secondary | ICD-10-CM | POA: Diagnosis not present

## 2022-07-05 DIAGNOSIS — Z8546 Personal history of malignant neoplasm of prostate: Secondary | ICD-10-CM

## 2022-07-05 DIAGNOSIS — Z8042 Family history of malignant neoplasm of prostate: Secondary | ICD-10-CM

## 2022-07-05 DIAGNOSIS — R410 Disorientation, unspecified: Secondary | ICD-10-CM

## 2022-07-05 DIAGNOSIS — R230 Cyanosis: Secondary | ICD-10-CM | POA: Diagnosis not present

## 2022-07-05 DIAGNOSIS — I2489 Other forms of acute ischemic heart disease: Secondary | ICD-10-CM | POA: Diagnosis present

## 2022-07-05 DIAGNOSIS — R29818 Other symptoms and signs involving the nervous system: Secondary | ICD-10-CM | POA: Diagnosis not present

## 2022-07-05 DIAGNOSIS — F03B0 Unspecified dementia, moderate, without behavioral disturbance, psychotic disturbance, mood disturbance, and anxiety: Secondary | ICD-10-CM | POA: Diagnosis not present

## 2022-07-05 DIAGNOSIS — J690 Pneumonitis due to inhalation of food and vomit: Secondary | ICD-10-CM | POA: Diagnosis present

## 2022-07-05 DIAGNOSIS — I452 Bifascicular block: Secondary | ICD-10-CM | POA: Diagnosis present

## 2022-07-05 DIAGNOSIS — R040 Epistaxis: Secondary | ICD-10-CM | POA: Diagnosis present

## 2022-07-05 DIAGNOSIS — K920 Hematemesis: Secondary | ICD-10-CM | POA: Diagnosis not present

## 2022-07-05 DIAGNOSIS — F03B3 Unspecified dementia, moderate, with mood disturbance: Secondary | ICD-10-CM | POA: Diagnosis not present

## 2022-07-05 DIAGNOSIS — G9341 Metabolic encephalopathy: Secondary | ICD-10-CM | POA: Diagnosis not present

## 2022-07-05 DIAGNOSIS — N179 Acute kidney failure, unspecified: Secondary | ICD-10-CM | POA: Diagnosis present

## 2022-07-05 DIAGNOSIS — N1832 Chronic kidney disease, stage 3b: Secondary | ICD-10-CM | POA: Diagnosis present

## 2022-07-05 DIAGNOSIS — Z9221 Personal history of antineoplastic chemotherapy: Secondary | ICD-10-CM

## 2022-07-05 DIAGNOSIS — R0902 Hypoxemia: Secondary | ICD-10-CM | POA: Diagnosis not present

## 2022-07-05 DIAGNOSIS — I080 Rheumatic disorders of both mitral and aortic valves: Secondary | ICD-10-CM | POA: Diagnosis present

## 2022-07-05 DIAGNOSIS — R4182 Altered mental status, unspecified: Secondary | ICD-10-CM | POA: Diagnosis not present

## 2022-07-05 DIAGNOSIS — Z85818 Personal history of malignant neoplasm of other sites of lip, oral cavity, and pharynx: Secondary | ICD-10-CM

## 2022-07-05 DIAGNOSIS — G250 Essential tremor: Secondary | ICD-10-CM | POA: Diagnosis present

## 2022-07-05 DIAGNOSIS — I2510 Atherosclerotic heart disease of native coronary artery without angina pectoris: Secondary | ICD-10-CM | POA: Diagnosis present

## 2022-07-05 DIAGNOSIS — I6782 Cerebral ischemia: Secondary | ICD-10-CM | POA: Diagnosis not present

## 2022-07-05 DIAGNOSIS — Z8249 Family history of ischemic heart disease and other diseases of the circulatory system: Secondary | ICD-10-CM

## 2022-07-05 DIAGNOSIS — Z888 Allergy status to other drugs, medicaments and biological substances status: Secondary | ICD-10-CM

## 2022-07-05 DIAGNOSIS — D62 Acute posthemorrhagic anemia: Secondary | ICD-10-CM | POA: Diagnosis not present

## 2022-07-05 DIAGNOSIS — R918 Other nonspecific abnormal finding of lung field: Secondary | ICD-10-CM | POA: Diagnosis not present

## 2022-07-05 DIAGNOSIS — R4701 Aphasia: Secondary | ICD-10-CM | POA: Diagnosis present

## 2022-07-05 DIAGNOSIS — Z7901 Long term (current) use of anticoagulants: Secondary | ICD-10-CM

## 2022-07-05 DIAGNOSIS — S0990XA Unspecified injury of head, initial encounter: Secondary | ICD-10-CM | POA: Diagnosis not present

## 2022-07-05 DIAGNOSIS — R7989 Other specified abnormal findings of blood chemistry: Secondary | ICD-10-CM

## 2022-07-05 DIAGNOSIS — Z79899 Other long term (current) drug therapy: Secondary | ICD-10-CM

## 2022-07-05 DIAGNOSIS — R000 Tachycardia, unspecified: Secondary | ICD-10-CM | POA: Diagnosis not present

## 2022-07-05 DIAGNOSIS — Z87891 Personal history of nicotine dependence: Secondary | ICD-10-CM

## 2022-07-05 DIAGNOSIS — Z1152 Encounter for screening for COVID-19: Secondary | ICD-10-CM | POA: Diagnosis not present

## 2022-07-05 DIAGNOSIS — Z800 Family history of malignant neoplasm of digestive organs: Secondary | ICD-10-CM

## 2022-07-05 DIAGNOSIS — Z79890 Hormone replacement therapy (postmenopausal): Secondary | ICD-10-CM

## 2022-07-05 HISTORY — DX: Pneumonitis due to inhalation of food and vomit: J69.0

## 2022-07-05 HISTORY — DX: Metabolic encephalopathy: G93.41

## 2022-07-05 LAB — I-STAT VENOUS BLOOD GAS, ED
Acid-base deficit: 1 mmol/L (ref 0.0–2.0)
Bicarbonate: 24.6 mmol/L (ref 20.0–28.0)
Calcium, Ion: 1.14 mmol/L — ABNORMAL LOW (ref 1.15–1.40)
HCT: 41 % (ref 39.0–52.0)
Hemoglobin: 13.9 g/dL (ref 13.0–17.0)
O2 Saturation: 62 %
Potassium: 4.7 mmol/L (ref 3.5–5.1)
Sodium: 138 mmol/L (ref 135–145)
TCO2: 26 mmol/L (ref 22–32)
pCO2, Ven: 43 mmHg — ABNORMAL LOW (ref 44–60)
pH, Ven: 7.365 (ref 7.25–7.43)
pO2, Ven: 34 mmHg (ref 32–45)

## 2022-07-05 LAB — CBC WITH DIFFERENTIAL/PLATELET
Abs Immature Granulocytes: 0 10*3/uL (ref 0.00–0.07)
Basophils Absolute: 0.1 10*3/uL (ref 0.0–0.1)
Basophils Relative: 1 %
Eosinophils Absolute: 0 10*3/uL (ref 0.0–0.5)
Eosinophils Relative: 0 %
HCT: 42.4 % (ref 39.0–52.0)
Hemoglobin: 13.5 g/dL (ref 13.0–17.0)
Lymphocytes Relative: 0 %
Lymphs Abs: 0 10*3/uL — ABNORMAL LOW (ref 0.7–4.0)
MCH: 28.7 pg (ref 26.0–34.0)
MCHC: 31.8 g/dL (ref 30.0–36.0)
MCV: 90 fL (ref 80.0–100.0)
Monocytes Absolute: 0.4 10*3/uL (ref 0.1–1.0)
Monocytes Relative: 4 %
Neutro Abs: 8.6 10*3/uL — ABNORMAL HIGH (ref 1.7–7.7)
Neutrophils Relative %: 95 %
Platelets: 151 10*3/uL (ref 150–400)
RBC: 4.71 MIL/uL (ref 4.22–5.81)
RDW: 15.1 % (ref 11.5–15.5)
WBC: 9.1 10*3/uL (ref 4.0–10.5)
nRBC: 0 % (ref 0.0–0.2)
nRBC: 0 /100 WBC

## 2022-07-05 LAB — COMPREHENSIVE METABOLIC PANEL
ALT: 17 U/L (ref 0–44)
AST: 31 U/L (ref 15–41)
Albumin: 3.4 g/dL — ABNORMAL LOW (ref 3.5–5.0)
Alkaline Phosphatase: 47 U/L (ref 38–126)
Anion gap: 9 (ref 5–15)
BUN: 29 mg/dL — ABNORMAL HIGH (ref 8–23)
CO2: 24 mmol/L (ref 22–32)
Calcium: 8.9 mg/dL (ref 8.9–10.3)
Chloride: 107 mmol/L (ref 98–111)
Creatinine, Ser: 1.95 mg/dL — ABNORMAL HIGH (ref 0.61–1.24)
GFR, Estimated: 34 mL/min — ABNORMAL LOW (ref >60)
Glucose, Bld: 125 mg/dL — ABNORMAL HIGH (ref 70–99)
Potassium: 4.6 mmol/L (ref 3.5–5.1)
Sodium: 140 mmol/L (ref 135–145)
Total Bilirubin: 0.6 mg/dL (ref 0.3–1.2)
Total Protein: 6 g/dL — ABNORMAL LOW (ref 6.5–8.1)

## 2022-07-05 LAB — TROPONIN I (HIGH SENSITIVITY)
Troponin I (High Sensitivity): 1050 ng/L (ref <18)
Troponin I (High Sensitivity): 1667 ng/L (ref <18)
Troponin I (High Sensitivity): 2468 ng/L (ref <18)

## 2022-07-05 LAB — RESP PANEL BY RT-PCR (RSV, FLU A&B, COVID)  RVPGX2
Influenza A by PCR: NEGATIVE
Influenza B by PCR: NEGATIVE
Resp Syncytial Virus by PCR: NEGATIVE
SARS Coronavirus 2 by RT PCR: NEGATIVE

## 2022-07-05 LAB — LIPASE, BLOOD: Lipase: 23 U/L (ref 11–51)

## 2022-07-05 LAB — LACTIC ACID, PLASMA
Lactic Acid, Venous: 1.9 mmol/L (ref 0.5–1.9)
Lactic Acid, Venous: 1.9 mmol/L (ref 0.5–1.9)

## 2022-07-05 LAB — I-STAT CHEM 8, ED
BUN: 34 mg/dL — ABNORMAL HIGH (ref 8–23)
Calcium, Ion: 1.11 mmol/L — ABNORMAL LOW (ref 1.15–1.40)
Chloride: 105 mmol/L (ref 98–111)
Creatinine, Ser: 2 mg/dL — ABNORMAL HIGH (ref 0.61–1.24)
Glucose, Bld: 115 mg/dL — ABNORMAL HIGH (ref 70–99)
HCT: 42 % (ref 39.0–52.0)
Hemoglobin: 14.3 g/dL (ref 13.0–17.0)
Potassium: 4.7 mmol/L (ref 3.5–5.1)
Sodium: 139 mmol/L (ref 135–145)
TCO2: 24 mmol/L (ref 22–32)

## 2022-07-05 LAB — TYPE AND SCREEN
ABO/RH(D): O NEG
Antibody Screen: NEGATIVE

## 2022-07-05 LAB — ETHANOL: Alcohol, Ethyl (B): <10 mg/dL (ref <10)

## 2022-07-05 LAB — PROTIME-INR
INR: 2 — ABNORMAL HIGH (ref 0.8–1.2)
Prothrombin Time: 22.3 seconds — ABNORMAL HIGH (ref 11.4–15.2)

## 2022-07-05 MED ORDER — SODIUM CHLORIDE 0.9 % IV SOLN
500.0000 mg | Freq: Once | INTRAVENOUS | Status: AC
  Administered 2022-07-05: 500 mg via INTRAVENOUS
  Filled 2022-07-05: qty 5

## 2022-07-05 MED ORDER — SODIUM CHLORIDE 0.9 % IV SOLN
3.0000 g | Freq: Four times a day (QID) | INTRAVENOUS | Status: DC
  Administered 2022-07-05 – 2022-07-07 (×8): 3 g via INTRAVENOUS
  Filled 2022-07-05 (×8): qty 8

## 2022-07-05 MED ORDER — QUETIAPINE FUMARATE 50 MG PO TABS
200.0000 mg | ORAL_TABLET | Freq: Every evening | ORAL | Status: DC
  Administered 2022-07-05 – 2022-07-07 (×3): 200 mg via ORAL
  Filled 2022-07-05: qty 4
  Filled 2022-07-05: qty 2
  Filled 2022-07-05: qty 4
  Filled 2022-07-05: qty 2

## 2022-07-05 MED ORDER — LORAZEPAM 0.5 MG PO TABS
1.0000 mg | ORAL_TABLET | Freq: Two times a day (BID) | ORAL | Status: DC
  Administered 2022-07-05 – 2022-07-08 (×6): 1 mg via ORAL
  Filled 2022-07-05 (×6): qty 2

## 2022-07-05 MED ORDER — SODIUM CHLORIDE 0.9 % IV SOLN
3.0000 g | Freq: Once | INTRAVENOUS | Status: AC
  Administered 2022-07-05: 3 g via INTRAVENOUS
  Filled 2022-07-05: qty 8

## 2022-07-05 MED ORDER — ACETAMINOPHEN 325 MG PO TABS: 650.0000 mg | ORAL_TABLET | Freq: Four times a day (QID) | ORAL | Status: DC | PRN

## 2022-07-05 MED ORDER — SENNOSIDES-DOCUSATE SODIUM 8.6-50 MG PO TABS: 1.0000 | ORAL_TABLET | ORAL | Status: DC | PRN

## 2022-07-05 MED ORDER — PANTOPRAZOLE SODIUM 40 MG IV SOLR
40.0000 mg | Freq: Two times a day (BID) | INTRAVENOUS | Status: DC
  Administered 2022-07-05 – 2022-07-07 (×5): 40 mg via INTRAVENOUS
  Filled 2022-07-05 (×6): qty 10

## 2022-07-05 MED ORDER — METOPROLOL TARTRATE 50 MG PO TABS
50.0000 mg | ORAL_TABLET | Freq: Two times a day (BID) | ORAL | Status: DC
  Administered 2022-07-05 – 2022-07-08 (×5): 50 mg via ORAL
  Filled 2022-07-05 (×3): qty 1
  Filled 2022-07-05: qty 2
  Filled 2022-07-05 (×2): qty 1

## 2022-07-05 MED ORDER — LAMOTRIGINE 100 MG PO TABS
100.0000 mg | ORAL_TABLET | Freq: Every day | ORAL | Status: DC
  Administered 2022-07-05 – 2022-07-07 (×3): 100 mg via ORAL
  Filled 2022-07-05: qty 1
  Filled 2022-07-05: qty 4
  Filled 2022-07-05 (×2): qty 1

## 2022-07-05 MED ORDER — SODIUM CHLORIDE 0.9 % IV BOLUS
1000.0000 mL | Freq: Once | INTRAVENOUS | Status: AC
  Administered 2022-07-05: 1000 mL via INTRAVENOUS

## 2022-07-05 MED ORDER — ROSUVASTATIN CALCIUM 5 MG PO TABS
10.0000 mg | ORAL_TABLET | Freq: Every evening | ORAL | Status: DC
  Administered 2022-07-05 – 2022-07-07 (×3): 10 mg via ORAL
  Filled 2022-07-05 (×3): qty 2

## 2022-07-05 MED ORDER — POLYETHYLENE GLYCOL 3350 17 G PO PACK: 17.0000 g | PACK | Freq: Every day | ORAL | Status: DC | PRN

## 2022-07-05 MED ORDER — LEVOTHYROXINE SODIUM 25 MCG PO TABS
125.0000 ug | ORAL_TABLET | Freq: Every day | ORAL | Status: DC
  Administered 2022-07-05 – 2022-07-08 (×3): 125 ug via ORAL
  Filled 2022-07-05 (×3): qty 1

## 2022-07-05 MED ORDER — ACETAMINOPHEN 650 MG RE SUPP: 650.0000 mg | Freq: Four times a day (QID) | RECTAL | Status: DC | PRN

## 2022-07-05 MED ORDER — ONDANSETRON HCL 4 MG/2ML IJ SOLN
4.0000 mg | Freq: Once | INTRAMUSCULAR | Status: AC
  Administered 2022-07-05: 4 mg via INTRAVENOUS
  Filled 2022-07-05: qty 2

## 2022-07-05 MED ORDER — LACTATED RINGERS IV SOLN: INTRAVENOUS | Status: DC

## 2022-07-05 NOTE — ED Triage Notes (Addendum)
BIBA from home with reports of AMS and weakness. Per family found this am to be nonambulatory, in a pool of vomit and being unable to stay awake. Pt normally talkative to wife. Pt has dementia at baseline. Lungs clear via EMS. Pt on blood thinner. Dark colored vomit on R side of face.  VS: BP- 142/80 HR-100 SpO2- 87 RA 5L Branch 95% CBG-132

## 2022-07-05 NOTE — H&P (Signed)
Date: 07/05/2022               Patient Name:  [name] MRN: [id number]  DOB: [birth year] Age / Sex: 82 y.o., male   PCP: [name], MD         Medical Service: Internal Medicine Teaching Service         Attending Physician: Dr. Aldine Contes, MD      First Contact: Dr. Gaylyn Rong, MD Pager [phone]    Second Contact: Dr. Buddy Duty, DO Pager [phone]         After Hours (After 5p/  First Contact Pager: [phone]  weekends / holidays): Second Contact Pager: [phone]   SUBJECTIVE   Chief Complaint: AMS  History of Present Illness: [name] is a 82 yo male with dementia, hypertension, afib on warfarin, bipolar disorder, previous hx of throat cancer s/p chemo and radiation, who presented after being found down by his family.  Per his wife, she found him in the room this morning around 10 AM and when he didn't start getting up for breakfast, she found that he was less talkative and weak. She gave him an electrolyte drink but that didn't help him get up/didn't perk him up, and then she called EMS. Last night, the patient helped his wife bandage her leg and was still talking to her, but not as much. He had trouble focusing at that time too, which is not usual for him. He seemed confused to her - has seemed more confused than his baseline for the last few days. He has needed the walker recently and has been weaker than usual. Of note, earlier this week, she started noticing that he was leaning more towards the right while walking and in general had difficulty with balance.  At baseline, he will sometimes stay in bed for 3 days at a time. He usually is able to remember his name and his wife's name and knows where he is, but has a hard time knowing what day it is.  Additionally, she mentions that the patient possibly might have been out driving yesterday when he is not supposed to drive.  She thinks that he might of gone out to drink some alcohol while she was  out of the house.  She notes that he knows he is not supposed to do this with his current medication regimen.  He has not had any fevers at home. Sees floaters/birds in his vision. Has not complained of chest pain or dyspnea.  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  He did respond to some of our questions when prompted.  He mentions he is not having any chest pain or dyspnea currently.  He was confused about why he is in the emergency department.  He did not endorse any other pain.  He intermittently follows instructions.  MTS consulted for admission for acute encephalopathy and aspiration pneumonia.  Cardiology also consulted for troponinemia.  Meds:  Docusate Pepcid Lasix prn  Lamictal 100 mg qhs Synthroid 125 mcg Ativan 1 mg bid  Metoprolol 50 mg bid Omeprazole Seroquel 200 mg qhs Crestor 10 mg Warfarin 6.25 mg daily Fish oil Vitamin D3  Current Meds  Medication Sig   albuterol (PROVENTIL) (2.5 MG/3ML) 0.083% nebulizer solution Take 2.5 mg by nebulization every 6 (six) hours as needed for wheezing or shortness of  breath.   Cholecalciferol (VITAMIN D3) 125 MCG (5000 UT) TABS Take 5,000 Units by mouth daily.   Docusate Sodium (STOOL SOFTENER LAXATIVE PO) Take 1 tablet by mouth as needed for constipation.   famotidine (PEPCID) 40 MG tablet Take 40 mg by mouth at bedtime.   furosemide (LASIX) 20 MG tablet Take 20 mg by mouth daily as needed for edema or fluid.   lamoTRIgine (LAMICTAL) 100 MG tablet Take 100 mg by mouth at bedtime.   levothyroxine (SYNTHROID) 125 MCG tablet Take 125 mcg by mouth daily.   LORazepam (ATIVAN) 1 MG tablet Take 1 mg by mouth 2 (two) times daily.   metoprolol tartrate (LOPRESSOR) 50 MG tablet Take 1 tablet (50 mg total) by mouth 2 (two) times daily.   Omega-3 Fatty Acids (FISH OIL) 1000 MG CPDR Take  2,000 mg by mouth 2 (two) times daily.   omeprazole (PRILOSEC) 40 MG capsule Take 40 mg by mouth 2 (two) times daily.   polyethylene glycol (MIRALAX / GLYCOLAX) 17 g packet Take 17 g by mouth daily as needed for constipation.   QUEtiapine (SEROQUEL) 400 MG tablet Take 200 mg by mouth every evening.   rosuvastatin (CRESTOR) 10 MG tablet Take 10 mg by mouth every evening.   warfarin (COUMADIN) 2.5 MG tablet TAKE 2 1/2 TABLETS (6.'25MG'$ ) DAILY AS DIRECTED BY COUMADIN CLINIC.    Past Medical History  Past Surgical History:  Procedure Laterality Date   CARDIAC ELECTROPHYSIOLOGY STUDY AND ABLATION     COLONOSCOPY  04/07/2007   Small colonic polyp status post polypectomy. Pancolonic diverticulosis predominantly in the sigmoid colon. Internal hemorrhoids.    ESOPHAGOGASTRODUODENOSCOPY  06/15/2010   Erosive esophagitis. Status post PEG placment   EYE SURGERY     INGUINAL HERNIA REPAIR     PROSTATE BIOPSY     SEBACEOUS CYST REMOVAL     TONSILLECTOMY      Social:  Lives With: wife Occupation: Support: daughter in [place] but works at [employer]; wife his POA Level of Function: ambulates on his own with walker on occasion; does yard work; can bathe himself but does not prepare his own meals; is not supposed to drive anymore but has been PCP: Dr. Kennith Maes at [facility] in [place] (also has cardiologist, nephrologist, and pulmonologist) Substances: Previously smoked for 26 years and then chew tobacco but quit in 2011; does not drink alcohol on regular basis but may have drank yesterday   Family History: heart disease and prostate cancer in father  Allergies: Allergies as of 07/05/2022 - Review Complete 07/05/2022  Allergen Reaction Noted   Trileptal [oxcarbazepine] Rash 12/17/2018    Review of Systems: A complete ROS was negative except as per HPI.   OBJECTIVE:   Physical Exam: Blood pressure 125/74, pulse 95, temperature 99.4 F (37.4 C), temperature source Rectal, resp. rate (!) 24,  height '5\' 8"'$  (1.727 m), weight 95 kg, SpO2 100 %.  Constitutional: chronically ill-appearing elderly male laying in bed, in no acute distress HEENT: Cardiovascular: regular rate and rhythm, 4/6 systolic murmur right upper sternal border Pulmonary/Chest: Normal respiratory effort on 3 L nasal cannula.  Mild rhonchi bilaterally Abdominal: soft, non-tender, non-distended Neurological: alert & oriented to self, does not know year, place, or his wife,lifts upper extremities against gravity; grip strength weaker on R; lifts both legs against gravity  Follows commands intermittently  Skin: warm and dry Psych: pleasantly confused   Labs: CBC    Component Value Date/Time   WBC 9.1 07/05/2022 1322   RBC  4.71 07/05/2022 1322   HGB 13.9 07/05/2022 1335   HGB 14.6 12/16/2018 1506   HCT 41.0 07/05/2022 1335   HCT 41.7 12/16/2018 1506   PLT 151 07/05/2022 1322   PLT 182 12/16/2018 1506   MCV 90.0 07/05/2022 1322   MCV 87 12/16/2018 1506   MCH 28.7 07/05/2022 1322   MCHC 31.8 07/05/2022 1322   RDW 15.1 07/05/2022 1322   RDW 13.7 12/16/2018 1506   LYMPHSABS 0.0 (L) 07/05/2022 1322   LYMPHSABS 0.8 12/16/2018 1506   MONOABS 0.4 07/05/2022 1322   EOSABS 0.0 07/05/2022 1322   EOSABS 0.1 12/16/2018 1506   BASOSABS 0.1 07/05/2022 1322   BASOSABS 0.1 12/16/2018 1506     CMP     Component Value Date/Time   NA 138 07/05/2022 1335   NA 143 05/09/2022 1513   K 4.7 07/05/2022 1335   CL 105 07/05/2022 1334   CO2 24 07/05/2022 1322   GLUCOSE 115 (H) 07/05/2022 1334   BUN 34 (H) 07/05/2022 1334   BUN 17 05/09/2022 1513   CREATININE 2.00 (H) 07/05/2022 1334   CALCIUM 8.9 07/05/2022 1322   PROT 6.0 (L) 07/05/2022 1322   PROT 6.5 07/25/2021 0923   ALBUMIN 3.4 (L) 07/05/2022 1322   ALBUMIN 4.2 07/25/2021 0923   AST 31 07/05/2022 1322   ALT 17 07/05/2022 1322   ALKPHOS 47 07/05/2022 1322   BILITOT 0.6 07/05/2022 1322   BILITOT 0.2 07/25/2021 0923   GFRNONAA 34 (L) 07/05/2022 1322   GFRAA 54  (L) 12/16/2018 1506  PT 22.3, INR 2 Troponins 1050, 1667, pending Lactic acid 1.9, 1.9 Lipase 23 VBG with pH 7.365 BCx in process   Imaging: Bilateral opacities in lower lung fields concerning for multifocal pneumonia EKG:  Sinus tachycardia, prolonged PR, right bundle branch block ASSESSMENT & PLAN:   Assessment & Plan by Problem: Principal Problem:   Metabolic encephalopathy Active Problems:   Aspiration pneumonia ([place])   [name] is a 82 y.o. person living with a history of afib on warfarin, hypertension, bipolar disorder, and dementia who presented with increased confusion and weakness and admitted for acute metabolic encephalopathy on hospital day 0  Acute multifactorial encephalopathy Moderate dementia Bipolar disorder Patient presents with a few days of worsening confusion, weakness and was found down this morning in a pool of dark vomit.  He is alert and oriented x 1-2 at baseline and can perform some of his ADLs.  Workup so far is concerning for aspiration pneumonia, troponinemia, AKI with mildly elevated BUN.  Of note, he is also on several centrally acting medications including Ativan, Seroquel,, Lamictal.  He has no clear electrolyte abnormalities.  BG is normal on BMPs.  He does have some focal neurological deficits including weak Grip strength on the right hand as well as worsening aphasia, word finding difficulties, and gait/balance abnormalities which his wife stated started earlier this week.  New focal deficits are concerning for acute infarct, though no acute findings reported on initial head CT.  Low concern for seizure, did not have any bowel bladder incontinence or oral/tongue biting marks.  Overall, these possibly are all contributing to worsening acute encephalopathy in the background of moderate dementia. -Aspiration pneumonia treatment below - Follow-up brain MRI.  Consult neurology if concern for infarct, but would be likely a few days out by this  point. - AKI management as below - Troponinemia management below, but low suspicion for ACS at this time. - Will continue Seroquel, Lamictal for bipolar.  He does take scheduled Ativan daily.  Unsure about necessity of this but will continue due to concern for withdrawals if abruptly stopped. - Follow-up UDS, ethanol  Aspiration pneunonia Possible hematemesis History of oropharyngeal squamous cell carcinoma status post radiochemotherapy Seems to have chronic concern for aspiration which has worsened after his treatment for oropharyngeal squamous cell carcinoma.  He is presenting after being found in bed with dark/reddish colored vomit around him.  He does have evidence of multifocal pneumonia on his chest x-ray.  Respiratory status is stable on 3 L nasal cannula, he is not on any supplemental oxygen at home.  Was given a dose of Unasyn and azithromycin in the ED.  Will continue Unasyn for 5-day course to cover likely aspiration pneumonia.  He did pass bedside swallow, but given risk factors will need formal SLP eval. - Continue Unasyn 3 g 4 times daily for 5 to 7 days - N.p.o. sips and chips, follow-up SLP eval - Start IV Protonix 40 mg twice daily - Will consult GI nonemergently as he is currently stable, expedite if he decompensates  AKI on CKD 3B Recent baseline creatinine approximately 1.6.  Presenting with CR 1.95, BUN elevated 29.  Likely prerenal in the setting of vomiting, but could also have GI losses given concern for hematemesis. - Trend BMP -Continue IV fluids - Continue outpatient nephrology follow-up  Afib on warfarin s/p prior ablation On metoprolol tartrate 50 mg twice daily, warfarin for anticoagulation because he was unable to consistently afford DOAC.  His INRs fluctuate.  His initial EKG was consistent with sinus tachycardia.  Not a great candidate for warfarin given his baseline cognitive status.  He is undergoing evaluation for watchman and actually has a procedure  scheduled.  Will follow cardiology recommendations regarding this. -Continue Toprol tartrate 50 mg twice daily - Holding warfarin in the setting of possible hematemesis.  - Appreciate other cardiology recommendations  CAD, HTN, HLD Troponinemia Has uptrending troponins in the setting of being found down.  No concerning anginal symptoms or EKG changes currently, likely demand ischemia.  Cardiology following. - Continue to trend troponins - Continue metoprolol as above - Continue Crestor 20 - Appreciate cardiology recs  Hypothyroidism Continue Synthroid 125   Diet: NPO pending SLP eval VTE: None in the setting of possible hematemesis IVF: LR,75cc/hr Code: DNR  Prior to Admission Living Arrangement: Home, living wife Anticipated Discharge Location:  Pending clinical course and PT/OT evaluation Barriers to Discharge: workup and medical stability   Dispo: Admit patient to Observation with expected length of stay less than 2 midnights.  Signed: Gaylyn Rong, MD Pager [phone] Internal medicine resident PGY1  07/05/2022, 4:46 PM

## 2022-07-05 NOTE — ED Provider Notes (Addendum)
[facility] Provider Note  CSN: [id number] Arrival date & time: 07/05/22 1209  Chief Complaint(s) Altered Mental Status and Weakness  HPI [name] is a 82 y.o. male with history of coronary artery disease, dementia, bipolar disorder, hypertension, hyperlipidemia, atrial fibrillation on Xarelto presenting to the emergency department with confusion.  Patient found by family member, on the ground and full of vomit, seemed confused and lethargic.  EMS presented and found patient hypoxic.  They transported patient to the emergency department for further workup.  Patient denies any symptoms in the emergency department such as pain anywhere in his body, shortness of breath, nausea, abdominal pain, chest pain.  History limited due to dementia   Past Medical History Past Medical History:  Diagnosis Date   Acquired hypothyroidism 08/17/2014   Acute MI, true posterior wall, subsequent episode of care ([place]) 08/15/2014   EF 44% with mildly reduced LV function  Formatting of this note might be different from the original. EF 44% with mildly reduced LV function   AKI (acute kidney injury) ([place]) 09/20/2014   Anxiety    Atrial fibrillation ([place]) 09/28/2019   ablation in past  Formatting of this note might be different from the original. ablation in past   Bipolar disorder [facility])    w/dementia/psychotic episodes   Bradycardia 09/17/2014   Cancer ([place]) 09/28/2019   Prostate Cancer; Throat Cancer  Formatting of this note might be different from the original. Prostate Cancer; Throat Cancer   Cardiac murmur 09/28/2019   Cataract    Cervical radicular pain 07/16/2019   Cervical spondylosis 06/19/2019   Chest pain with high risk for cardiac etiology 08/19/2014   Chronic depression    Chronic pain due to trauma 07/16/2019   Corn of toe 12/11/2018   Coronary artery calcification 07/25/2021   Coronary artery disease 08/10/2014   DDD (degenerative disc disease), cervical  09/29/2018   Degenerative lumbar disc    Dementia without behavioral disturbance ([place]) 07/16/2019   Disease characterized by destruction of skeletal muscle 08/15/2014   Essential hypertension 09/28/2019   Essential tremor 09/21/2014   History of MI (myocardial infarction) 09/14/2014   Hypercholesteremia    Hypertension    Hypotension 09/14/2014   Long term (current) use of anticoagulants 11/14/2021   Malignant neoplasm of prostate ([place])    Mild aortic stenosis 11/26/2019   Mood change 10/12/2019   Myocardial infarction ([place]) 08/10/2014   Nausea and vomiting 08/19/2014   Onychomycosis of left great toe 08/21/2014   Pain of fifth toe 12/11/2018   Paroxysmal atrial fibrillation ([place]) 07/25/2021   Peptic esophagitis    Peripheral neuropathy    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/21/2014   Prolonged Q-T interval on ECG 08/19/2014   Prostate cancer ([place]) 09/28/2019   Reactive depression 10/12/2019   Renal insufficiency 08/17/2014   Right bundle branch block 09/28/2019   Sepsis ([place]) 05/18/2020   Small fiber neuropathy 12/16/2018   Squamous cell carcinoma of left tonsil (HCC)    Stage 3b chronic kidney disease ([place]) 12/20/2021   Throat cancer ([place])    Thyroid disease    Transaminitis 08/17/2014   Unstable angina ([place]) 09/14/2014   Whiplash injury syndrome, sequela 08/11/2018   Patient Active Problem List   Diagnosis Date Noted   Stage 3b chronic kidney disease ([place]) 12/20/2021   Long term (current) use of anticoagulants 11/14/2021   Paroxysmal atrial fibrillation ([place]) 07/25/2021   Coronary artery calcification 07/25/2021   Anxiety  Cataract    Chronic depression    Degenerative lumbar disc    Hypercholesteremia    Hypertension    Malignant neoplasm of prostate (HCC)    Peptic esophagitis    Peripheral neuropathy    Squamous cell carcinoma of left tonsil (HCC)    Thyroid disease    Sepsis ([place]) 05/18/2020   Mild aortic stenosis 11/26/2019   Reactive depression 10/12/2019   Mood change  10/12/2019   Atrial fibrillation ([place]) 09/28/2019   Bipolar disorder ([place]) 09/28/2019   Cancer ([place]) 09/28/2019   Prostate cancer ([place]) 09/28/2019   Right bundle branch block 09/28/2019   Throat cancer ([place]) 09/28/2019   Essential hypertension 09/28/2019   Cardiac murmur 09/28/2019   Chronic pain due to trauma 07/16/2019   Cervical radicular pain 07/16/2019   Dementia without behavioral disturbance ([place]) 07/16/2019   Cervical spondylosis 06/19/2019   Small fiber neuropathy 12/16/2018   Corn of toe 12/11/2018   Pain of fifth toe 12/11/2018   Degenerative disc disease, cervical 09/29/2018   Whiplash injury syndrome, sequela 08/11/2018   Essential tremor 09/21/2014   Peripheral neuropathy due to chemotherapy ([place]) 09/21/2014   AKI (acute kidney injury) ([place]) 09/20/2014   Bradycardia 09/17/2014   History of MI (myocardial infarction) 09/14/2014   Hypotension 09/14/2014   Unstable angina ([place]) 09/14/2014   Onychomycosis of left great toe 08/21/2014   Chest pain with high risk for cardiac etiology 08/19/2014   Nausea and vomiting 08/19/2014   Prolonged Q-T interval on ECG 08/19/2014   Acquired hypothyroidism 08/17/2014   Renal insufficiency 08/17/2014   Transaminitis 08/17/2014   Acute MI, true posterior wall, subsequent episode of care ([place]) 08/15/2014   Disease characterized by destruction of skeletal muscle 08/15/2014   Myocardial infarction ([place]) 08/10/2014   Coronary artery disease 08/10/2014   Home Medication(s) Prior to Admission medications   Medication Sig Start Date End Date Taking? Authorizing Provider  albuterol (PROVENTIL) (2.5 MG/3ML) 0.083% nebulizer solution Take 2.5 mg by nebulization every 6 (six) hours as needed for wheezing or shortness of breath. 04/09/22  Yes [provider]  Cholecalciferol (VITAMIN D3) 125 MCG (5000 UT) TABS Take 5,000 Units by mouth daily.   Yes [provider]  Docusate Sodium (STOOL SOFTENER LAXATIVE PO) Take 1 tablet  by mouth as needed for constipation.   Yes [provider]  famotidine (PEPCID) 40 MG tablet Take 40 mg by mouth at bedtime.   Yes [provider]  furosemide (LASIX) 20 MG tablet Take 20 mg by mouth daily as needed for edema or fluid. 04/17/19  Yes [provider]  lamoTRIgine (LAMICTAL) 100 MG tablet Take 100 mg by mouth at bedtime. 03/23/22  Yes [provider]  levothyroxine (SYNTHROID) 125 MCG tablet Take 125 mcg by mouth daily. 11/19/19  Yes [provider]  LORazepam (ATIVAN) 1 MG tablet Take 1 mg by mouth 2 (two) times daily. 05/15/22  Yes [provider]  metoprolol tartrate (LOPRESSOR) 50 MG tablet Take 1 tablet (50 mg total) by mouth 2 (two) times daily. 01/22/22  Yes Revankar, Reita Cliche, MD  Omega-3 Fatty Acids (FISH OIL) 1000 MG CPDR Take 2,000 mg by mouth 2 (two) times daily.   Yes [provider]  omeprazole (PRILOSEC) 40 MG capsule Take 40 mg by mouth 2 (two) times daily. 01/21/22  Yes [provider]  polyethylene glycol (MIRALAX / GLYCOLAX) 17 g packet Take 17 g by mouth daily as needed for constipation.   Yes [provider]  QUEtiapine (SEROQUEL) 400 MG tablet Take 200 mg by mouth every evening. 03/12/20  Yes [provider]  rosuvastatin (CRESTOR) 10 MG tablet Take 10 mg by mouth every evening.   Yes [provider]  warfarin (COUMADIN) 2.5 MG tablet TAKE 2 1/2 TABLETS (6.'25MG'$ ) DAILY AS DIRECTED BY COUMADIN CLINIC. 05/01/22  Yes Revankar, Reita Cliche, MD  LORazepam (ATIVAN) 2 MG tablet Take 1 mg by mouth 2 (two) times daily. 02/16/22   [provider]                                                                                                                                    Past Surgical History Past Surgical History:  Procedure Laterality Date   CARDIAC ELECTROPHYSIOLOGY STUDY AND ABLATION     COLONOSCOPY  04/07/2007   Small colonic polyp status post polypectomy. Pancolonic  diverticulosis predominantly in the sigmoid colon. Internal hemorrhoids.    ESOPHAGOGASTRODUODENOSCOPY  06/15/2010   Erosive esophagitis. Status post PEG placment   EYE SURGERY     INGUINAL HERNIA REPAIR     PROSTATE BIOPSY     SEBACEOUS CYST REMOVAL     TONSILLECTOMY     Family History Family History  Problem Relation Age of Onset   Other Mother        brain tumor - unsure if it was cancer   Heart disease Father    Prostate cancer Father    Stomach cancer Father    Colon cancer Neg Hx    Esophageal cancer Neg Hx    Rectal cancer Neg Hx     Social History Social History   Tobacco Use   Smoking status: Former    Types: Cigarettes    Quit date: 2011    Years since quitting: 13.0   Smokeless tobacco: Former  Scientific laboratory technician Use: Never used  Substance Use Topics   Alcohol use: Not Currently   Drug use: Never   Allergies Trileptal [oxcarbazepine]  Review of Systems Review of Systems  Unable to perform ROS: Dementia    Physical Exam Vital Signs  I have reviewed the triage vital signs BP (!) 154/81   Pulse 98   Temp 99.4 F (37.4 C) (Rectal)   Resp (!) 22   Ht '5\' 8"'$  (1.727 m)   Wt 95 kg   SpO2 95%   BMI 31.84 kg/m  Physical Exam Vitals and nursing note reviewed.  Constitutional:      General: He is not in acute distress.    Appearance: Normal appearance.  HENT:     Head: Normocephalic and atraumatic.     Comments: Dried dark vomit around mouth.    Mouth/Throat:     Mouth: Mucous membranes are dry.  Eyes:     Conjunctiva/sclera: Conjunctivae normal.  Cardiovascular:     Rate and Rhythm: Normal rate and regular rhythm.  Pulmonary:     Effort: Pulmonary effort  is normal. No respiratory distress.     Breath sounds: Rales (bibasilar) present.  Abdominal:     General: Abdomen is flat.     Palpations: Abdomen is soft.     Tenderness: There is no abdominal tenderness.  Genitourinary:    Comments: Chaperoned by RN, no stool in rectal vault to test  for occult blood Musculoskeletal:     Right lower leg: No edema.     Left lower leg: No edema.     Comments: No midline C, T, L-spine tenderness.  No chest wall tenderness or crepitus.  Full painless range of motion at the bilateral upper extremities including the shoulders, elbows, wrists, hand and fingers, and in the bilateral lower extremities including the hips, knees, ankle, toes.  No focal bony tenderness, injury or deformity.  Skin:    General: Skin is warm and dry.     Capillary Refill: Capillary refill takes less than 2 seconds.  Neurological:     Mental Status: He is alert.     Comments: Oriented to self, not to place, year, situation.  Cranial nerves grossly intact, moving all 4 extremities  Psychiatric:        Mood and Affect: Mood normal.        Behavior: Behavior normal.     ED Results and Treatments Labs (all labs ordered are listed, but only abnormal results are displayed) Labs Reviewed  COMPREHENSIVE METABOLIC PANEL - Abnormal; Notable for the following components:      Result Value   Glucose, Bld 125 (*)    BUN 29 (*)    Creatinine, Ser 1.95 (*)    Total Protein 6.0 (*)    Albumin 3.4 (*)    GFR, Estimated 34 (*)    All other components within normal limits  CBC WITH DIFFERENTIAL/PLATELET - Abnormal; Notable for the following components:   Neutro Abs 8.6 (*)    Lymphs Abs 0.0 (*)    All other components within normal limits  PROTIME-INR - Abnormal; Notable for the following components:   Prothrombin Time 22.3 (*)    INR 2.0 (*)    All other components within normal limits  I-STAT CHEM 8, ED - Abnormal; Notable for the following components:   BUN 34 (*)    Creatinine, Ser 2.00 (*)    Glucose, Bld 115 (*)    Calcium, Ion 1.11 (*)    All other components within normal limits  I-STAT VENOUS BLOOD GAS, ED - Abnormal; Notable for the following components:   pCO2, Ven 43.0 (*)    Calcium, Ion 1.14 (*)    All other components within normal limits  TROPONIN I  (HIGH SENSITIVITY) - Abnormal; Notable for the following components:   Troponin I (High Sensitivity) 1,050 (*)    All other components within normal limits  RESP PANEL BY RT-PCR (RSV, FLU A&B, COVID)  RVPGX2  CULTURE, BLOOD (ROUTINE X 2)  CULTURE, BLOOD (ROUTINE X 2)  LACTIC ACID, PLASMA  LIPASE, BLOOD  URINALYSIS, ROUTINE W REFLEX MICROSCOPIC  LACTIC ACID, PLASMA  TYPE AND SCREEN  TROPONIN I (HIGH SENSITIVITY)  Radiology CT Cervical Spine Wo Contrast  Result Date: 07/05/2022 CLINICAL DATA:  Neck trauma EXAM: CT CERVICAL SPINE WITHOUT CONTRAST TECHNIQUE: Multidetector CT imaging of the cervical spine was performed without intravenous contrast. Multiplanar CT image reconstructions were also generated. RADIATION DOSE REDUCTION: This exam was performed according to the departmental dose-optimization program which includes automated exposure control, adjustment of the mA and/or kV according to patient size and/or use of iterative reconstruction technique. COMPARISON:  None Available. FINDINGS: Alignment: Facet joints are aligned without dislocation or traumatic listhesis. Dens and lateral masses are aligned. Degenerative facet mediated grade 1 anterolisthesis of C4 on C5. Skull base and vertebrae: No acute fracture. No primary bone lesion or focal pathologic process. Soft tissues and spinal canal: No prevertebral fluid or swelling. No visible canal hematoma. Disc levels: Degenerative disc disease of the C4-5 through C6-7 levels. Multilevel facet arthropathy. The left C4-5 facet joint is fused. Upper chest: Negative. Other: Bilateral carotid atherosclerosis. Small calcified left anterior chain cervical lymph nodes. IMPRESSION: 1. No acute fracture or traumatic listhesis of the cervical spine. 2. Multilevel degenerative disc disease and facet arthropathy. Electronically Signed   By:  Davina Poke D.O.   On: 07/05/2022 14:04   DG Chest Port 1 View  Result Date: 07/05/2022 CLINICAL DATA:  Vomiting EXAM: PORTABLE CHEST 1 VIEW COMPARISON:  CXR 05/04/22 FINDINGS: No pleural effusion. No pneumothorax. There are patchy opacities in the right mid and lower lung, as well as the left lung base. These are worrisome for multifocal infection, possibly related to aspiration. No displaced rib fractures. Visualized upper abdomen is notable for gaseous distention of the colon at the splenic flexure. IMPRESSION: Patchy opacities in the right mid and lower lung, as well as the left lung base. These are worrisome for multifocal infection, possibly related to aspiration. Electronically Signed   By: Marin Roberts M.D.   On: 07/05/2022 13:33    Pertinent labs & imaging results that were available during my care of the patient were reviewed by me and considered in my medical decision making (see MDM for details).  Medications Ordered in ED Medications  Ampicillin-Sulbactam (UNASYN) 3 g in sodium chloride 0.9 % 100 mL IVPB (has no administration in time range)  azithromycin (ZITHROMAX) 500 mg in sodium chloride 0.9 % 250 mL IVPB (has no administration in time range)  sodium chloride 0.9 % bolus 1,000 mL (1,000 mLs Intravenous New Bag/Given 07/05/22 1320)  ondansetron (ZOFRAN) injection 4 mg (4 mg Intravenous Given 07/05/22 1319)                                                                                                                                     Procedures .Critical Care  Performed by: Cristie Hem, MD Authorized by: Cristie Hem, MD   Critical care provider statement:    Critical care time (minutes):  30   Critical care was necessary to treat or prevent  imminent or life-threatening deterioration of the following conditions:  Respiratory failure and cardiac failure   Critical care was time spent personally by me on the following activities:  Development of treatment  plan with patient or surrogate, discussions with consultants, evaluation of patient's response to treatment, examination of patient, ordering and review of laboratory studies, ordering and review of radiographic studies, ordering and performing treatments and interventions, pulse oximetry, re-evaluation of patient's condition and review of old charts   Care discussed with: admitting provider     (including critical care time)  Medical Decision Making / ED Course   MDM:  82 year old male presenting with confusion.  Patient chronically ill-appearing, no acute distress although is on oxygen.  He does have some basilar crackles.  He has some dried vomit around his mouth.  Workup concerning for aspiration pneumonia.  Unclear why patient vomited, he currently denies any complaints including abdominal pain, chest pain, nausea.  He has no abdominal tenderness to suggest intra-abdominal process and denies abdominal pain, he has not had any nausea or vomiting since arriving in the emergency department.  Vomit was dark concerning for possible GI bleeding, however he has no melena on rectal exam and his hemoglobin is reassuring.  Chest x-ray does show evidence of multifocal pneumonia concerning for aspiration.  Treat for aspiration pneumonia with Unasyn and add azithromycin to cover for atypical infection.  Given patient was found on the floor, CT head and neck was performed although patient has no obvious external signs of trauma.  CT head without evidence of acute intracranial process no cervical spine fracture.  Remainder of his physical exam without obvious trauma.  Discussed with internal medicine teaching service who will admit patient for further evaluation and workup. Advised further monitoring including repeat hgb to rule out GI bleed. Given no obvious signs of bleeding and reassuring hgb and no tachycardia will not reverse anticoagulation at this time.       Additional history  obtained: -Additional history obtained from ems -External records from outside source obtained and reviewed including: Chart review including previous notes, labs, imaging, consultation notes including office visit 05/09/22   Lab Tests: -I ordered, reviewed, and interpreted labs.   The pertinent results include:   Labs Reviewed  COMPREHENSIVE METABOLIC PANEL - Abnormal; Notable for the following components:      Result Value   Glucose, Bld 125 (*)    BUN 29 (*)    Creatinine, Ser 1.95 (*)    Total Protein 6.0 (*)    Albumin 3.4 (*)    GFR, Estimated 34 (*)    All other components within normal limits  CBC WITH DIFFERENTIAL/PLATELET - Abnormal; Notable for the following components:   Neutro Abs 8.6 (*)    Lymphs Abs 0.0 (*)    All other components within normal limits  PROTIME-INR - Abnormal; Notable for the following components:   Prothrombin Time 22.3 (*)    INR 2.0 (*)    All other components within normal limits  I-STAT CHEM 8, ED - Abnormal; Notable for the following components:   BUN 34 (*)    Creatinine, Ser 2.00 (*)    Glucose, Bld 115 (*)    Calcium, Ion 1.11 (*)    All other components within normal limits  I-STAT VENOUS BLOOD GAS, ED - Abnormal; Notable for the following components:   pCO2, Ven 43.0 (*)    Calcium, Ion 1.14 (*)    All other components within normal limits  TROPONIN I (HIGH SENSITIVITY) -  Abnormal; Notable for the following components:   Troponin I (High Sensitivity) 1,050 (*)    All other components within normal limits  RESP PANEL BY RT-PCR (RSV, FLU A&B, COVID)  RVPGX2  CULTURE, BLOOD (ROUTINE X 2)  CULTURE, BLOOD (ROUTINE X 2)  LACTIC ACID, PLASMA  LIPASE, BLOOD  URINALYSIS, ROUTINE W REFLEX MICROSCOPIC  LACTIC ACID, PLASMA  TYPE AND SCREEN  TROPONIN I (HIGH SENSITIVITY)    Notable for reassuring hbg, AKI on CKD, elevated INR  EKG   EKG Interpretation  Date/Time:  Thursday July 05 2022 12:29:49 EST Ventricular Rate:  103 PR  Interval:  218 QRS Duration: 151 QT Interval:  344 QTC Calculation: 451 R Axis:   -79 Text Interpretation: Sinus tachycardia Prolonged PR interval RBBB and LAFB Artifact in lead(s) I II aVR aVL V1 V2 When compared to prior PR interval increased Confirmed by Garnette Gunner [phone]) on 07/05/2022 1:15:28 PM         Imaging Studies ordered: I ordered imaging studies including CXR, CT head and neck On my interpretation imaging demonstrates no traumatic injury, multifocal pna I independently visualized and interpreted imaging. I agree with the radiologist interpretation   Medicines ordered and prescription drug management: Meds ordered this encounter  Medications   sodium chloride 0.9 % bolus 1,000 mL   ondansetron (ZOFRAN) injection 4 mg   Ampicillin-Sulbactam (UNASYN) 3 g in sodium chloride 0.9 % 100 mL IVPB    Order Specific Question:   Antibiotic Indication:    Answer:   Aspiration Pneumonia   azithromycin (ZITHROMAX) 500 mg in sodium chloride 0.9 % 250 mL IVPB    -I have reviewed the patients home medicines and have made adjustments as needed   Consultations Obtained: I requested consultation with the internal medicine team,  and discussed lab and imaging findings as well as pertinent plan - they recommend: admit for aspiration pneumonia   Cardiac Monitoring: The patient was maintained on a cardiac monitor.  I personally viewed and interpreted the cardiac monitored which showed an underlying rhythm of: NSR  Social Determinants of Health:  Diagnosis or treatment significantly limited by social determinants of health: obesity, dementia   Reevaluation: After the interventions noted above, I reevaluated the patient and found that they have improved  Co morbidities that complicate the patient evaluation  Past Medical History:  Diagnosis Date   Acquired hypothyroidism 08/17/2014   Acute MI, true posterior wall, subsequent episode of care ([place]) 08/15/2014   EF 44% with  mildly reduced LV function  Formatting of this note might be different from the original. EF 44% with mildly reduced LV function   AKI (acute kidney injury) ([place]) 09/20/2014   Anxiety    Atrial fibrillation ([place]) 09/28/2019   ablation in past  Formatting of this note might be different from the original. ablation in past   Bipolar disorder [facility])    w/dementia/psychotic episodes   Bradycardia 09/17/2014   Cancer ([place]) 09/28/2019   Prostate Cancer; Throat Cancer  Formatting of this note might be different from the original. Prostate Cancer; Throat Cancer   Cardiac murmur 09/28/2019   Cataract    Cervical radicular pain 07/16/2019   Cervical spondylosis 06/19/2019   Chest pain with high risk for cardiac etiology 08/19/2014   Chronic depression    Chronic pain due to trauma 07/16/2019   Corn of toe 12/11/2018   Coronary artery calcification 07/25/2021   Coronary artery disease 08/10/2014   DDD (degenerative disc disease), cervical 09/29/2018   Degenerative lumbar  disc    Dementia without behavioral disturbance ([place]) 07/16/2019   Disease characterized by destruction of skeletal muscle 08/15/2014   Essential hypertension 09/28/2019   Essential tremor 09/21/2014   History of MI (myocardial infarction) 09/14/2014   Hypercholesteremia    Hypertension    Hypotension 09/14/2014   Long term (current) use of anticoagulants 11/14/2021   Malignant neoplasm of prostate ([place])    Mild aortic stenosis 11/26/2019   Mood change 10/12/2019   Myocardial infarction ([place]) 08/10/2014   Nausea and vomiting 08/19/2014   Onychomycosis of left great toe 08/21/2014   Pain of fifth toe 12/11/2018   Paroxysmal atrial fibrillation ([place]) 07/25/2021   Peptic esophagitis    Peripheral neuropathy    Peripheral neuropathy due to chemotherapy ([place]) 09/21/2014   Prolonged Q-T interval on ECG 08/19/2014   Prostate cancer ([place]) 09/28/2019   Reactive depression 10/12/2019   Renal insufficiency 08/17/2014   Right bundle branch block 09/28/2019   Sepsis ([place])  05/18/2020   Small fiber neuropathy 12/16/2018   Squamous cell carcinoma of left tonsil (HCC)    Stage 3b chronic kidney disease ([place]) 12/20/2021   Throat cancer ([place])    Thyroid disease    Transaminitis 08/17/2014   Unstable angina ([place]) 09/14/2014   Whiplash injury syndrome, sequela 08/11/2018      Dispostion: Disposition decision including need for hospitalization was considered, and patient admitted to the hospital.    Final Clinical Impression(s) / ED Diagnoses Final diagnoses:  Aspiration pneumonia of both lower lobes due to vomit ([place])  Confusion  NSTEMI (non-ST elevated myocardial infarction) [facility])     This chart was dictated using voice recognition software.  Despite best efforts to proofread,  errors can occur which can change the documentation meaning.    Cristie Hem, MD 07/05/22 1451    Cristie Hem, MD 07/05/22 [phone]

## 2022-07-05 NOTE — Consult Note (Addendum)
Cardiology Consultation   Patient ID: [name] MRN: [id number]; DOB: Oct 09, 1940  Admit date: 07/05/2022 Date of Consult: 07/05/2022  PCP:  [name], MD   [place] Providers Cardiologist:  None  Electrophysiologist:  Vickie Epley, MD       Patient Profile:   [name] is a 82 y.o. male with a hx of CAD, paroxysmal afib w/ hx prior AF ablation on warfarin, hypertension, dyslipidemia, mild aortic stenosis, renal insufficiency, dementia, bipolar disorder who is being seen 07/05/2022 for the evaluation of elevated troponin at the request of Dr. Truett Mainland.  History of Present Illness:   [name] was brought to the [facility] ED by EMS on 07/05/22 via EMS.  Patient is acutely altered mental status and the majority of HPI is provided by his spouse.  She advises that over the past several months patient has had "good days and bad days."  She says that on a good day patient will get up, mow the yard, and is able to drive.  On bad days, patient will often not even get out of bed.  She says that some of this has always been felt to be due to his bipolar disorder, but says that he has had a significant increase in the number of bad days in recent weeks.  Patient has some baseline shortness of breath with chronic lung disease.  Apparently patient has had worsening shortness of breath and fatigue over the last few days.  Yesterday patient was confused than usual and lethargic.  Patient's wife says that he barely ate dinner last night and went to sleep around 6, waking up at 9 PM.  Patient's bowel says that he attempted to help her bandage a chronic wound that she has on her leg and that it seemed like he was not able to see what he was doing.  She also says that a few days ago the patient said that he was seeing birds while they were riding in the car.  Patient then went back to bed around 10 PM last night, with patient's spouse not seeing him again until  around 10 AM this morning.  She says that when she went to get him out of bed, she noticed a dark liquid pulled around his head, mouth, nose, she believed it was blood.  Patient was physically unable to get out of bed and this prompted patient's spouse to call EMS.  Patient adds today that he has not had chest pain or shortness of breath.  Patient knows that he is in a [place] facility but is otherwise not oriented to time, place, event. He says he is in [place]  Initial labs were notable for troponin of 1050, albumin 3.4, total protein 6.0, creatinine 2.0, lactic acid 1.9. VBG pH 7.365, pCO2 43, pO2 (venous) 34, bicarb 24.6. CXR with patchy opacities in right mid and lower lung as well as left base, concerning for aspiration. CT head without evidence of acute intracranial abnormality. CT c-spine with no acute findings, multilevel degenerative disc disease and facet arthropathy.  Patient is followed by Dr. Geraldo Pitter, last seen on 03/26/22. He has also been seen by Dr. Quentin Ore for consideration of Watchman device due to being a poor long-term anticoagulation candidate. Appears that this procedure is tentatively scheduled for August 30, 2022. As a part of pre-operative workup, patient had cardiac CT which noted CAC score of 1875. His last TTE was in September 2023, noted LVEF 60-65%  with normal wall motion and grade I diastolic dysfunction.    Past Medical History:  Diagnosis Date   Acquired hypothyroidism 08/17/2014   Acute MI, true posterior wall, subsequent episode of care ([place]) 08/15/2014   EF 44% with mildly reduced LV function  Formatting of this note might be different from the original. EF 44% with mildly reduced LV function   AKI (acute kidney injury) ([place]) 09/20/2014   Anxiety    Atrial fibrillation ([place]) 09/28/2019   ablation in past  Formatting of this note might be different from the original. ablation in past   Bipolar disorder [facility])    w/dementia/psychotic episodes   Bradycardia  09/17/2014   Cancer ([place]) 09/28/2019   Prostate Cancer; Throat Cancer  Formatting of this note might be different from the original. Prostate Cancer; Throat Cancer   Cardiac murmur 09/28/2019   Cataract    Cervical radicular pain 07/16/2019   Cervical spondylosis 06/19/2019   Chest pain with high risk for cardiac etiology 08/19/2014   Chronic depression    Chronic pain due to trauma 07/16/2019   Corn of toe 12/11/2018   Coronary artery calcification 07/25/2021   Coronary artery disease 08/10/2014   DDD (degenerative disc disease), cervical 09/29/2018   Degenerative lumbar disc    Dementia without behavioral disturbance ([place]) 07/16/2019   Disease characterized by destruction of skeletal muscle 08/15/2014   Essential hypertension 09/28/2019   Essential tremor 09/21/2014   History of MI (myocardial infarction) 09/14/2014   Hypercholesteremia    Hypertension    Hypotension 09/14/2014   Long term (current) use of anticoagulants 11/14/2021   Malignant neoplasm of prostate ([place])    Mild aortic stenosis 11/26/2019   Mood change 10/12/2019   Myocardial infarction ([place]) 08/10/2014   Nausea and vomiting 08/19/2014   Onychomycosis of left great toe 08/21/2014   Pain of fifth toe 12/11/2018   Paroxysmal atrial fibrillation ([place]) 07/25/2021   Peptic esophagitis    Peripheral neuropathy    Peripheral neuropathy due to chemotherapy ([place]) 09/21/2014   Prolonged Q-T interval on ECG 08/19/2014   Prostate cancer ([place]) 09/28/2019   Reactive depression 10/12/2019   Renal insufficiency 08/17/2014   Right bundle branch block 09/28/2019   Sepsis ([place]) 05/18/2020   Small fiber neuropathy 12/16/2018   Squamous cell carcinoma of left tonsil (HCC)    Stage 3b chronic kidney disease ([place]) 12/20/2021   Throat cancer ([place])    Thyroid disease    Transaminitis 08/17/2014   Unstable angina ([place]) 09/14/2014   Whiplash injury syndrome, sequela 08/11/2018    Past Surgical History:  Procedure Laterality Date   CARDIAC ELECTROPHYSIOLOGY STUDY AND ABLATION      COLONOSCOPY  04/07/2007   Small colonic polyp status post polypectomy. Pancolonic diverticulosis predominantly in the sigmoid colon. Internal hemorrhoids.    ESOPHAGOGASTRODUODENOSCOPY  06/15/2010   Erosive esophagitis. Status post PEG placment   EYE SURGERY     INGUINAL HERNIA REPAIR     PROSTATE BIOPSY     SEBACEOUS CYST REMOVAL     TONSILLECTOMY       Home Medications:  Prior to Admission medications   Medication Sig Start Date End Date Taking? Authorizing Provider  albuterol (PROVENTIL) (2.5 MG/3ML) 0.083% nebulizer solution Take 2.5 mg by nebulization every 6 (six) hours as needed for wheezing or shortness of breath. 04/09/22  Yes [provider]  Cholecalciferol (VITAMIN D3) 125 MCG (5000 UT) TABS Take 5,000 Units by mouth daily.   Yes [provider]  Docusate Sodium (STOOL  SOFTENER LAXATIVE PO) Take 1 tablet by mouth as needed for constipation.   Yes [provider]  famotidine (PEPCID) 40 MG tablet Take 40 mg by mouth at bedtime.   Yes [provider]  furosemide (LASIX) 20 MG tablet Take 20 mg by mouth daily as needed for edema or fluid. 04/17/19  Yes [provider]  lamoTRIgine (LAMICTAL) 100 MG tablet Take 100 mg by mouth at bedtime. 03/23/22  Yes [provider]  levothyroxine (SYNTHROID) 125 MCG tablet Take 125 mcg by mouth daily. 11/19/19  Yes [provider]  LORazepam (ATIVAN) 1 MG tablet Take 1 mg by mouth 2 (two) times daily. 05/15/22  Yes [provider]  metoprolol tartrate (LOPRESSOR) 50 MG tablet Take 1 tablet (50 mg total) by mouth 2 (two) times daily. 01/22/22  Yes Revankar, Reita Cliche, MD  Omega-3 Fatty Acids (FISH OIL) 1000 MG CPDR Take 2,000 mg by mouth 2 (two) times daily.   Yes [provider]  omeprazole (PRILOSEC) 40 MG capsule Take 40 mg by mouth 2 (two) times daily. 01/21/22  Yes [provider]  polyethylene glycol (MIRALAX / GLYCOLAX) 17 g packet Take 17 g by mouth daily  as needed for constipation.   Yes [provider]  QUEtiapine (SEROQUEL) 400 MG tablet Take 200 mg by mouth every evening. 03/12/20  Yes [provider]  rosuvastatin (CRESTOR) 10 MG tablet Take 10 mg by mouth every evening.   Yes [provider]  warfarin (COUMADIN) 2.5 MG tablet TAKE 2 1/2 TABLETS (6.'25MG'$ ) DAILY AS DIRECTED BY COUMADIN CLINIC. 05/01/22  Yes Revankar, Reita Cliche, MD    Inpatient Medications: Scheduled Meds:  Continuous Infusions:  ampicillin-sulbactam (UNASYN) IV 3 g (07/05/22 1526)   azithromycin (ZITHROMAX) 500 mg in sodium chloride 0.9 % 250 mL IVPB     PRN Meds:   Allergies:    Allergies  Allergen Reactions   Trileptal [Oxcarbazepine] Rash    Social History:   Social History   Socioeconomic History   Marital status: Married    Spouse name: Not on file   Number of children: 3   Years of education: 10th grade   Highest education level: Not on file  Occupational History   Occupation: Retired  Tobacco Use   Smoking status: Former    Types: Cigarettes    Quit date: 2011    Years since quitting: 13.0   Smokeless tobacco: Former  Scientific laboratory technician Use: Never used  Substance and Sexual Activity   Alcohol use: Not Currently   Drug use: Never   Sexual activity: Not on file  Other Topics Concern   Not on file  Social History Narrative   Lives at home with wife.   Right-handed.   2 cups of caffeine per day.   Social Determinants of Health   Financial Resource Strain: Not on file  Food Insecurity: Not on file  Transportation Needs: Not on file  Physical Activity: Not on file  Stress: Not on file  Social Connections: Not on file  Intimate Partner Violence: Not on file    Family History:    Family History  Problem Relation Age of Onset   Other Mother        brain tumor - unsure if it was cancer   Heart disease Father    Prostate cancer Father    Stomach cancer Father    Colon cancer Neg Hx    Esophageal cancer Neg  Hx    Rectal  cancer Neg Hx      ROS:  Please see the history of present illness.   All other ROS reviewed and negative.     Physical Exam/Data:   Vitals:   07/05/22 1300 07/05/22 1330 07/05/22 1500 07/05/22 1530  BP: (!) 172/87 (!) 154/81 125/74 96/68  Pulse: 98 98 95 93  Resp: (!) 26 (!) 22 (!) 24 (!) 25  Temp:      TempSrc:      SpO2: 96% 95% 100% 98%  Weight:      Height:        Intake/Output Summary (Last 24 hours) at 07/05/2022 1543 Last data filed at 07/05/2022 1527 Gross per 24 hour  Intake 1000 ml  Output --  Net 1000 ml      07/05/2022   12:17 PM 05/09/2022    2:45 PM 05/08/2022    2:58 PM  Last 3 Weights  Weight (lbs) 209 lb 7 oz 211 lb 205 lb  Weight (kg) 95 kg 95.709 kg 92.987 kg     Body mass index is 31.84 kg/m.  General: Patient is acutely confused, in no acute distress but is ill-appearing HEENT: normal Neck: no JVD Vascular: No carotid bruits; Distal pulses 2+ bilaterally Cardiac:  normal S1, S2; RRR; systolic murmur noted Lungs: Significant right side rhonchi, bilateral bases are diminished Abd: soft, nontender, no hepatomegaly  Ext: no edema Musculoskeletal:  No deformities, BUE and BLE strength normal and equal Skin: warm and dry  Neuro:  CNs 2-12 intact, no focal abnormalities noted Psych:  Normal affect   EKG:  The EKG was personally reviewed and demonstrates: Sinus tachycardia with a right bundle branch block morphology, LAFB Telemetry:  Telemetry was personally reviewed and demonstrates: Borderline sinus tachycardia  Relevant CV Studies:  03/06/22 TTE  IMPRESSIONS     1. Left ventricular ejection fraction, by estimation, is 60 to 65%. The  left ventricle has normal function. The left ventricle has no regional  wall motion abnormalities. There is moderate concentric left ventricular  hypertrophy. Left ventricular  diastolic parameters are consistent with Grade I diastolic dysfunction  (impaired relaxation).   2. Right  ventricular systolic function is normal. The right ventricular  size is normal. There is normal pulmonary artery systolic pressure.   3. Left atrial size was mild to moderately dilated.   4. The mitral valve is degenerative. Mild mitral valve regurgitation. No  evidence of mitral stenosis.   5. The aortic valve is tricuspid. There is mild calcification of the  aortic valve. There is mild thickening of the aortic valve. Aortic valve  regurgitation is mild. Mild aortic valve stenosis.   6. Aortic Normal DTA.   7. The inferior vena cava is normal in size with greater than 50%  respiratory variability, suggesting right atrial pressure of 3 mmHg.   FINDINGS   Left Ventricle: Left ventricular ejection fraction, by estimation, is 60  to 65%. The left ventricle has normal function. The left ventricle has no  regional wall motion abnormalities. The left ventricular internal cavity  size was normal in size. There is   moderate concentric left ventricular hypertrophy. Left ventricular  diastolic parameters are consistent with Grade I diastolic dysfunction  (impaired relaxation).   Right Ventricle: The right ventricular size is normal. No increase in  right ventricular wall thickness. Right ventricular systolic function is  normal. There is normal pulmonary artery systolic pressure. The tricuspid  regurgitant velocity is 2.78 m/s, and   with an assumed right atrial  pressure of 3 mmHg, the estimated right  ventricular systolic pressure is 97.6 mmHg.   Left Atrium: Left atrial size was mild to moderately dilated.   Right Atrium: Right atrial size was normal in size.   Pericardium: There is no evidence of pericardial effusion.   Mitral Valve: The mitral valve is degenerative in appearance. Mild mitral  annular calcification. Mild mitral valve regurgitation. No evidence of  mitral valve stenosis.   Tricuspid Valve: The tricuspid valve is normal in structure. Tricuspid  valve regurgitation is  trivial. No evidence of tricuspid stenosis.   Aortic Valve: The aortic valve is tricuspid. There is mild calcification  of the aortic valve. There is mild thickening of the aortic valve. Aortic  valve regurgitation is mild. Aortic regurgitation PHT measures 702 msec.  Mild aortic stenosis is present.  Aortic valve mean gradient measures 14.3 mmHg. Aortic valve peak gradient  measures 27.9 mmHg. Aortic valve area, by VTI measures 1.25 cm.   Pulmonic Valve: The pulmonic valve was normal in structure. Pulmonic valve  regurgitation is not visualized. No evidence of pulmonic stenosis.   Aorta: The aortic root and ascending aorta are structurally normal, with  no evidence of dilitation, the aortic arch was not well visualized and  Normal DTA.   Venous: The pulmonary veins were not well visualized. The inferior vena  cava is normal in size with greater than 50% respiratory variability,  suggesting right atrial pressure of 3 mmHg.   IAS/Shunts: No atrial level shunt detected by color flow Doppler.   05/30/22 Cardiac CT  FINDINGS: Image quality: excellent.   Noise artifact is: Limited.   Left Atrium: The left atrial size is dilated. There is no PFO/ASD. There is normal pulmonary vein drainage into the left atrium (2 on the right and 2 on the left).   Left Atrial Appendage:   Morphology: The left atrial appendage is large broccoli type with two lobes. Thrombus: There is no thrombus in the left atrial appendage delayed imaging.   The following measurements were made regarding left atrial appendage closure:   Phase assessed: 35%   Landing Zone measurement: 22.6 mm x 20.6 mm   LAA Length (maximum): 18.5 mm   Optimal interatrial septum puncture site: Mid and posterior   Optimal deployment angle: LAO 7 CAU 2   Catheter: A double curve catheter is recommended.   Watchman FLX Device: A 27 mm device is recommended with 16% compression.   Other comments: None.   Coronary  Arteries: CAC score of 1875, which is 84th percentile for age-, sex-, and race-matched controls. Normal coronary origin. Right dominance. The study was performed without use of NTG and is insufficient for plaque evaluation.   Right Atrium: Right atrial size is dilated.   Right Ventricle: The right ventricular cavity is within normal limits.   Left Ventricle: The ventricular cavity size is within normal limits.   Pulmonary Artery: Normal caliber without proximal filling defect.   Cardiac valves: The aortic valve is trileaflet without significant calcification. The mitral valve is normal structure without significant calcification.   Aorta: Normal caliber with no significant disease.   Pericardium: Normal thickness with no significant effusion or calcium present.   Extra-cardiac findings: See attached radiology report for non-cardiac structures.   IMPRESSION: 1. The left atrial appendage is large broccoli type with two lobes.   2. A 27 mm Watchman FLX device is recommended based on the above landing zone measurements (22.6 mm maximum diameter; 16% compression).   3. There  is no thrombus in the left atrial appendage.   4. A mid posterior IAS puncture site is recommended.   5. Optimal deployment angle: LAO 7 CAU 2   6. CAC score of 1875, which is 84th percentile for age-, sex-, and race-matched controls.  Laboratory Data:  High Sensitivity Troponin:   Recent Labs  Lab 07/05/22 1322  TROPONINIHS 1,050*     Chemistry Recent Labs  Lab 07/05/22 1322 07/05/22 1334 07/05/22 1335  NA 140 139 138  K 4.6 4.7 4.7  CL 107 105  --   CO2 24  --   --   GLUCOSE 125* 115*  --   BUN 29* 34*  --   CREATININE 1.95* 2.00*  --   CALCIUM 8.9  --   --   GFRNONAA 34*  --   --   ANIONGAP 9  --   --     Recent Labs  Lab 07/05/22 1322  PROT 6.0*  ALBUMIN 3.4*  AST 31  ALT 17  ALKPHOS 47  BILITOT 0.6   Lipids No results for input(s): "CHOL", "TRIG", "HDL", "LABVLDL",  "LDLCALC", "CHOLHDL" in the last 168 hours.  Hematology Recent Labs  Lab 07/05/22 1322 07/05/22 1334 07/05/22 1335  WBC 9.1  --   --   RBC 4.71  --   --   HGB 13.5 14.3 13.9  HCT 42.4 42.0 41.0  MCV 90.0  --   --   MCH 28.7  --   --   MCHC 31.8  --   --   RDW 15.1  --   --   PLT 151  --   --    Thyroid No results for input(s): "TSH", "FREET4" in the last 168 hours.  BNPNo results for input(s): "BNP", "PROBNP" in the last 168 hours.  DDimer No results for input(s): "DDIMER" in the last 168 hours.   Radiology/Studies:  CT Head Wo Contrast  Result Date: 07/05/2022 CLINICAL DATA:  Head trauma, minor (Age >= 65y) EXAM: CT HEAD WITHOUT CONTRAST TECHNIQUE: Contiguous axial images were obtained from the base of the skull through the vertex without intravenous contrast. RADIATION DOSE REDUCTION: This exam was performed according to the departmental dose-optimization program which includes automated exposure control, adjustment of the mA and/or kV according to patient size and/or use of iterative reconstruction technique. COMPARISON:  May 04, 2022. FINDINGS: Brain: No evidence of acute infarction, hemorrhage, hydrocephalus, extra-axial collection or mass lesion/mass effect. Patchy white matter hypodensities nonspecific but compatible with chronic microvascular ischemic disease. Vascular: No hyperdense vessel. Skull: No acute fracture. Sinuses/Orbits: Anterior right ethmoid air cell osteoma. No acute orbital findings. Other: No mastoid effusions IMPRESSION: No evidence of acute intracranial abnormality. Electronically Signed   By: Margaretha Sheffield M.D.   On: 07/05/2022 14:33   CT Cervical Spine Wo Contrast  Result Date: 07/05/2022 CLINICAL DATA:  Neck trauma EXAM: CT CERVICAL SPINE WITHOUT CONTRAST TECHNIQUE: Multidetector CT imaging of the cervical spine was performed without intravenous contrast. Multiplanar CT image reconstructions were also generated. RADIATION DOSE REDUCTION: This exam  was performed according to the departmental dose-optimization program which includes automated exposure control, adjustment of the mA and/or kV according to patient size and/or use of iterative reconstruction technique. COMPARISON:  None Available. FINDINGS: Alignment: Facet joints are aligned without dislocation or traumatic listhesis. Dens and lateral masses are aligned. Degenerative facet mediated grade 1 anterolisthesis of C4 on C5. Skull base and vertebrae: No acute fracture. No primary bone lesion or focal pathologic process. Soft  tissues and spinal canal: No prevertebral fluid or swelling. No visible canal hematoma. Disc levels: Degenerative disc disease of the C4-5 through C6-7 levels. Multilevel facet arthropathy. The left C4-5 facet joint is fused. Upper chest: Negative. Other: Bilateral carotid atherosclerosis. Small calcified left anterior chain cervical lymph nodes. IMPRESSION: 1. No acute fracture or traumatic listhesis of the cervical spine. 2. Multilevel degenerative disc disease and facet arthropathy. Electronically Signed   By: Davina Poke D.O.   On: 07/05/2022 14:04   DG Chest Port 1 View  Result Date: 07/05/2022 CLINICAL DATA:  Vomiting EXAM: PORTABLE CHEST 1 VIEW COMPARISON:  CXR 05/04/22 FINDINGS: No pleural effusion. No pneumothorax. There are patchy opacities in the right mid and lower lung, as well as the left lung base. These are worrisome for multifocal infection, possibly related to aspiration. No displaced rib fractures. Visualized upper abdomen is notable for gaseous distention of the colon at the splenic flexure. IMPRESSION: Patchy opacities in the right mid and lower lung, as well as the left lung base. These are worrisome for multifocal infection, possibly related to aspiration. Electronically Signed   By: Marin Roberts M.D.   On: 07/05/2022 13:33     Assessment and Plan:  [name] is a 82 y.o. male with a hx of CAD, paroxysmal afib w/ hx prior AF ablation  on warfarin, hypertension, dyslipidemia, mild aortic stenosis, renal insufficiency, dementia, bipolar disorder who is being seen 07/05/2022 for the evaluation of elevated troponin at the request of Dr. Truett Mainland.  CAD Elevated troponin  Patient admitted to medicine/teaching service after being found on the floor this morning by his spouse. Troponin elevated to 1050. Patient had cardiac CT December 2023 which noted CAC score of 1875. His last TTE was in September 2023, noted LVEF 60-65% with normal wall motion and grade I diastolic dysfunction.  Given lack of chest pain and no acute ischemic changes on ECG, I have very low suspicion that patient's elevated troponin is a result of ACS.  Will assess for wall motion abnormality with echocardiogram. Given chronic renal dysfunction, patient would not be a good candidate for heart catheterization or aggressive cardiac eval.  Likely a result of aspiration pneumonia, made worse by renal dysfunction. Continue to monitor for changes in symptoms, development of chest pain. Would use ASA '81mg'$  with Warfarin discontinued.   Paroxysmal atrial fibrillation  Patient with paroxysmal atrial fibrillation, currently on warfarin.  He is actively being worked up for [employer] placement, this procedure is scheduled for 08/30/2022.  Telemetry and ECG show normal sinus rhythm.  Patient is on warfarin due to inability to afford DOAC.  I have significant concerns about use of warfarin with this patient. He is a very poor candidate for long-term anticoagulation.  Review of labs shows that patient's INR is very fluctuant.  Additionally, I am not sure the patient should undergo watchman placement as planned.  This procedure may need to be delayed, will need to discuss with Dr. Quentin Ore Continue Metoprolol Tartrate '50mg'$  for rate control Would strongly consider discontinuing Warfarin and transition to DVT dose in light of reported bleeding/aspiration at home.    Hypertension  Continue Metoprolol Tartrate '50mg'$  BID  AKI on CKD Stage IIIb  Patient with known renal dysfunction, has creatinine elevated to 2.0 today. Will need close monitoring of renal function, may need nephrology involvement.  Dyslipidemia  Continue statin therapy.   Risk Assessment/Risk Scores:          CHA2DS2-VASc Score = 3   This  indicates a 3.2% annual risk of stroke. The patient's score is based upon: CHF History: 0 HTN History: 1 Diabetes History: 0 Stroke History: 0 Vascular Disease History: 0 Age Score: 2 Gender Score: 0         For questions or updates, please contact [place] Please consult [email or web address] for contact info under    Signed, Lily Kocher, PA-C  07/05/2022 3:43 PM As above, patient seen and examined.  Briefly he is a 82 year old male with past medical history of coronary artery disease based on coronary calcification, paroxysmal atrial fibrillation status post ablation on chronic Coumadin, hypertension, hyperlipidemia, mild aortic stenosis, renal insufficiency, bipolar disorder, dementia admitted with increasing confusion and agitation for evaluation of elevated troponin.  Most recent echocardiogram September 2023 showed normal LV function, moderate left ventricular hypertrophy, grade 1 diastolic dysfunction, mild to moderate left atrial enlargement, mild mitral regurgitation, mild aortic insufficiency, mild aortic stenosis.  Patient had CTA December 2023 in anticipation of watchman placement.  Calcium score was noted to be 1875 which was 84th percentile.  Per patient's wife he has had slow decline over the past several years with worsening confusion.  He has "good days and bad days".  He does have some dyspnea.  There are days when he stays in bed due to his bipolar disorder.  Over the past 4 to 5 days she has noticed increasing weakness and confusion.  There has been no increased dyspnea or chest pain by his report.  He went to bed  last evening and when she tried to help him get up this morning at 10:00 he was so weak he could not get out of bed.  There was also blood in the bed from probable nosebleed.  EMS was called and he was transferred to the emergency room.  Troponin was checked for unclear reasons.  It was elevated and cardiology asked to evaluate. At time of my evaluation patient thinks he is in [place] and does not know year. Chest x-ray suggestive of aspiration.  BUN 34, creatinine 2.0, troponin 1050 and 1667.  White blood cell count 9.1 with hemoglobin 13.5.  INR 2.  Electrocardiogram shows sinus rhythm, left anterior fascicular block, right bundle branch block, no acute ST changes.  1 elevated troponin-occurring in the setting of probable aspiration pneumonia and renal insufficiency.  Patient denies chest pain.  Electrocardiogram shows no acute ST changes.  Would plan to proceed with echocardiogram.  If no wall motion abnormalities noted and no change compared to previous would not pursue further ischemia evaluation.  Not clear to me that the patient is a candidate for aggressive cardiac evaluation.  2 history of paroxysmal atrial fibrillation-patient is in sinus rhythm.  He is on chronic Coumadin.  Given his overall medical condition I do not think he is a good candidate for long-term anticoagulation.  He is being considered for a Watchman device and in fact this has been scheduled.  However in my review I am not sure he is a candidate for this procedure.  Will review with Dr. Quentin Ore.  Continue metoprolol for rate control if atrial fibrillation recurs.  3 coronary calcification-noted on recent CT scan.  Continue statin.  If Coumadin is discontinued will treat with aspirin 81 mg daily.  If he makes significant improvement could consider outpatient nuclear study to screen for ischemia.  However he appears to be declining significantly per his wife.  Best option may be medical therapy.  4 hypertension-continue  preadmission medications and follow.  5  hyperlipidemia-continue statin.  6 chronic stage IIIb kidney disease-follow renal function while in house.  Kirk Ruths, MD

## 2022-07-05 NOTE — ED Notes (Signed)
ED TO INPATIENT HANDOFF REPORT  ED Nurse Name and Phone #: Leodis Liverpool  S Name/Age/Gender [name] 82 y.o. male Room/Bed: 025C/025C  Code Status   Code Status: DNR  Home/SNF/Other Home Patient oriented to: self Is this baseline? No   Triage Complete: Triage complete  Chief Complaint Aspiration pneumonia ([place]) [J69.0]  Triage Note BIBA from home with reports of AMS and weakness. Per family found this am to be nonambulatory, in a pool of vomit and being unable to stay awake. Pt normally talkative to wife. Pt has dementia at baseline. Lungs clear via EMS. Pt on blood thinner. Dark colored vomit on R side of face.  VS: BP- 142/80 HR-100 SpO2- 87 RA 5L [place] 95% CBG-132    Allergies Allergies  Allergen Reactions   Trileptal [Oxcarbazepine] Rash    Level of Care/Admitting Diagnosis ED Disposition     ED Disposition  Admit   Condition  --   Comment  Hospital Area: [facility] [[id number]]  Level of Care: Telemetry Medical [104]  May place patient in observation at [facility] or [facility] if equivalent level of care is available:: No  Covid Evaluation: Asymptomatic - no recent exposure (last 10 days) testing not required  Diagnosis: Aspiration pneumonia [facility]) [[id number]]  Admitting Physician: Aldine Contes [[id number]]  Attending Physician: Aldine Contes [phone]          B Medical/Surgery History Past Medical History:  Diagnosis Date   Acquired hypothyroidism 08/17/2014   Acute MI, true posterior wall, subsequent episode of care ([place]) 08/15/2014   EF 44% with mildly reduced LV function  Formatting of this note might be different from the original. EF 44% with mildly reduced LV function   AKI (acute kidney injury) ([place]) 09/20/2014   Anxiety    Atrial fibrillation ([place]) 09/28/2019   ablation in past  Formatting of this note might be different from the original. ablation in past   Bipolar disorder [facility])    w/dementia/psychotic  episodes   Bradycardia 09/17/2014   Cancer ([place]) 09/28/2019   Prostate Cancer; Throat Cancer  Formatting of this note might be different from the original. Prostate Cancer; Throat Cancer   Cardiac murmur 09/28/2019   Cataract    Cervical radicular pain 07/16/2019   Cervical spondylosis 06/19/2019   Chest pain with high risk for cardiac etiology 08/19/2014   Chronic depression    Chronic pain due to trauma 07/16/2019   Corn of toe 12/11/2018   Coronary artery calcification 07/25/2021   Coronary artery disease 08/10/2014   DDD (degenerative disc disease), cervical 09/29/2018   Degenerative lumbar disc    Dementia without behavioral disturbance ([place]) 07/16/2019   Disease characterized by destruction of skeletal muscle 08/15/2014   Essential hypertension 09/28/2019   Essential tremor 09/21/2014   History of MI (myocardial infarction) 09/14/2014   Hypercholesteremia    Hypertension    Hypotension 09/14/2014   Long term (current) use of anticoagulants 11/14/2021   Malignant neoplasm of prostate ([place])    Mild aortic stenosis 11/26/2019   Mood change 10/12/2019   Myocardial infarction ([place]) 08/10/2014   Nausea and vomiting 08/19/2014   Onychomycosis of left great toe 08/21/2014   Pain of fifth toe 12/11/2018   Paroxysmal atrial fibrillation ([place]) 07/25/2021   Peptic esophagitis    Peripheral neuropathy    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   Prolonged Q-T interval on ECG 08/19/2014   Prostate cancer ([place]) 09/28/2019   Reactive depression 10/12/2019   Renal  insufficiency 08/17/2014   Right bundle branch block 09/28/2019   Sepsis ([place]) 05/18/2020   Small fiber neuropathy 12/16/2018   Squamous cell carcinoma of left tonsil (HCC)    Stage 3b chronic kidney disease ([place]) 12/20/2021   Throat cancer ([place])    Thyroid disease    Transaminitis 08/17/2014   Unstable angina ([place]) 09/14/2014   Whiplash injury syndrome, sequela 08/11/2018   Past Surgical History:  Procedure Laterality Date   CARDIAC ELECTROPHYSIOLOGY  STUDY AND ABLATION     COLONOSCOPY  04/07/2007   Small colonic polyp status post polypectomy. Pancolonic diverticulosis predominantly in the sigmoid colon. Internal hemorrhoids.    ESOPHAGOGASTRODUODENOSCOPY  06/15/2010   Erosive esophagitis. Status post PEG placment   EYE SURGERY     INGUINAL HERNIA REPAIR     PROSTATE BIOPSY     SEBACEOUS CYST REMOVAL     TONSILLECTOMY       A IV Location/Drains/Wounds Patient Lines/Drains/Airways Status     Active Line/Drains/Airways     Name Placement date Placement time Site Days   Peripheral IV 05/30/22 18 G 1.25" Anterior;Distal;Right;Upper Arm 05/30/22  1417  Arm  36   Peripheral IV 07/05/22 18 G Right Antecubital 07/05/22  --  Antecubital  less than 1   Peripheral IV 07/05/22 20 G Posterior;Right Hand 07/05/22  1504  Hand  less than 1   External Urinary Catheter 07/05/22  1403  --  less than 1            Intake/Output Last 24 hours  Intake/Output Summary (Last 24 hours) at 07/05/2022 1614 Last data filed at 07/05/2022 1527 Gross per 24 hour  Intake 1000 ml  Output --  Net 1000 ml    Labs/Imaging Results for orders placed or performed during the hospital encounter of 07/05/22 (from the past 48 hour(s))  Resp panel by RT-PCR (RSV, Flu A&B, Covid) Anterior Nasal Swab     Status: None   Collection Time: 07/05/22 12:09 PM   Specimen: Anterior Nasal Swab  Result Value Ref Range   SARS Coronavirus 2 by RT PCR NEGATIVE NEGATIVE    Comment: (NOTE) SARS-CoV-2 target nucleic acids are NOT DETECTED.  The SARS-CoV-2 RNA is generally detectable in upper respiratory specimens during the acute phase of infection. The lowest concentration of SARS-CoV-2 viral copies this assay can detect is 138 copies/mL. A negative result does not preclude SARS-Cov-2 infection and should not be used as the sole basis for treatment or other patient management decisions. A negative result may occur with  improper specimen collection/handling, submission of  specimen other than nasopharyngeal swab, presence of viral mutation(s) within the areas targeted by this assay, and inadequate number of viral copies(<138 copies/mL). A negative result must be combined with clinical observations, patient history, and epidemiological information. The expected result is Negative.  Fact Sheet for Patients:  EntrepreneurPulse.com.au  Fact Sheet for Healthcare Providers:  IncredibleEmployment.be  This test is no t yet approved or cleared by the [place] FDA and  has been authorized for detection and/or diagnosis of SARS-CoV-2 by FDA under an Emergency Use Authorization (EUA). This EUA will remain  in effect (meaning this test can be used) for the duration of the COVID-19 declaration under Section 564(b)(1) of the Act, 21 U.S.C.section 360bbb-3(b)(1), unless the authorization is terminated  or revoked sooner.       Influenza A by PCR NEGATIVE NEGATIVE   Influenza B by PCR NEGATIVE NEGATIVE    Comment: (NOTE) The Xpert Xpress SARS-CoV-2/FLU/RSV plus assay is  intended as an aid in the diagnosis of influenza from Nasopharyngeal swab specimens and should not be used as a sole basis for treatment. Nasal washings and aspirates are unacceptable for Xpert Xpress SARS-CoV-2/FLU/RSV testing.  Fact Sheet for Patients: EntrepreneurPulse.com.au  Fact Sheet for Healthcare Providers: IncredibleEmployment.be  This test is not yet approved or cleared by the [place] FDA and has been authorized for detection and/or diagnosis of SARS-CoV-2 by FDA under an Emergency Use Authorization (EUA). This EUA will remain in effect (meaning this test can be used) for the duration of the COVID-19 declaration under Section 564(b)(1) of the Act, 21 U.S.C. section 360bbb-3(b)(1), unless the authorization is terminated or revoked.     Resp Syncytial Virus by PCR NEGATIVE NEGATIVE    Comment:  (NOTE) Fact Sheet for Patients: EntrepreneurPulse.com.au  Fact Sheet for Healthcare Providers: IncredibleEmployment.be  This test is not yet approved or cleared by the [place] FDA and has been authorized for detection and/or diagnosis of SARS-CoV-2 by FDA under an Emergency Use Authorization (EUA). This EUA will remain in effect (meaning this test can be used) for the duration of the COVID-19 declaration under Section 564(b)(1) of the Act, 21 U.S.C. section 360bbb-3(b)(1), unless the authorization is terminated or revoked.  Performed at [facility], [place] [address], [place], [place] [address]   Comprehensive metabolic panel     Status: Abnormal   Collection Time: 07/05/22  1:22 PM  Result Value Ref Range   Sodium 140 135 - 145 mmol/L   Potassium 4.6 3.5 - 5.1 mmol/L   Chloride 107 98 - 111 mmol/L   CO2 24 22 - 32 mmol/L   Glucose, Bld 125 (H) 70 - 99 mg/dL    Comment: Glucose reference range applies only to samples taken after fasting for at least 8 hours.   BUN 29 (H) 8 - 23 mg/dL   Creatinine, Ser 1.95 (H) 0.61 - 1.24 mg/dL   Calcium 8.9 8.9 - 10.3 mg/dL   Total Protein 6.0 (L) 6.5 - 8.1 g/dL   Albumin 3.4 (L) 3.5 - 5.0 g/dL   AST 31 15 - 41 U/L   ALT 17 0 - 44 U/L   Alkaline Phosphatase 47 38 - 126 U/L   Total Bilirubin 0.6 0.3 - 1.2 mg/dL   GFR, Estimated 34 (L) >60 mL/min    Comment: (NOTE) Calculated using the CKD-EPI Creatinine Equation (2021)    Anion gap 9 5 - 15    Comment: Performed at [facility], [place] [address], [place], [place] [address]  CBC with Differential     Status: Abnormal   Collection Time: 07/05/22  1:22 PM  Result Value Ref Range   WBC 9.1 4.0 - 10.5 K/uL   RBC 4.71 4.22 - 5.81 MIL/uL   Hemoglobin 13.5 13.0 - 17.0 g/dL   HCT 42.4 39.0 - 52.0 %   MCV 90.0 80.0 - 100.0 fL   MCH 28.7 26.0 - 34.0 pg   MCHC 31.8 30.0 - 36.0 g/dL   RDW 15.1 11.5 - 15.5 %   Platelets 151 150 - 400 K/uL   nRBC  0.0 0.0 - 0.2 %   Neutrophils Relative % 95 %   Neutro Abs 8.6 (H) 1.7 - 7.7 K/uL   Lymphocytes Relative 0 %   Lymphs Abs 0.0 (L) 0.7 - 4.0 K/uL   Monocytes Relative 4 %   Monocytes Absolute 0.4 0.1 - 1.0 K/uL   Eosinophils Relative 0 %   Eosinophils Absolute  0.0 0.0 - 0.5 K/uL   Basophils Relative 1 %   Basophils Absolute 0.1 0.0 - 0.1 K/uL   nRBC 0 0 /100 WBC   Abs Immature Granulocytes 0.00 0.00 - 0.07 K/uL    Comment: Performed at [facility], [place] [address], [place], [place] [address]  Protime-INR     Status: Abnormal   Collection Time: 07/05/22  1:22 PM  Result Value Ref Range   Prothrombin Time 22.3 (H) 11.4 - 15.2 seconds   INR 2.0 (H) 0.8 - 1.2    Comment: (NOTE) INR goal varies based on device and disease states. Performed at [facility], [place] [address] [place], [place] [address]   Type and screen [place]     Status: None   Collection Time: 07/05/22  1:22 PM  Result Value Ref Range   ABO/RH(D) O NEG    Antibody Screen NEG    Sample Expiration      07/08/2022,2359 Performed at [facility], [place] [address], [place], [place] [address]   Troponin I (High Sensitivity)     Status: Abnormal   Collection Time: 07/05/22  1:22 PM  Result Value Ref Range   Troponin I (High Sensitivity) 1,050 (HH) <18 ng/L    Comment: CRITICAL RESULT CALLED TO, READ BACK BY AND VERIFIED WITH B.Shylie Polo RN 1459 07/05/22 M[place]ORMICK K (NOTE) Elevated high sensitivity troponin I (hsTnI) values and significant  changes across serial measurements may suggest ACS but many other  chronic and acute conditions are known to elevate hsTnI results.  Refer to the "Links" section for chest pain algorithms and additional  guidance. Performed at [facility], [place] [address] [place], [place] [address]   Lactic acid, plasma     Status: None   Collection Time: 07/05/22  1:22 PM  Result Value Ref Range   Lactic Acid, Venous 1.9 0.5 - 1.9 mmol/L    Comment:  Performed at [place] [address] [place], [place] [address]  Lipase, blood     Status: None   Collection Time: 07/05/22  1:22 PM  Result Value Ref Range   Lipase 23 11 - 51 U/L    Comment: Performed at [place] [address] [place], [place] [address]  I-stat chem 8, ED     Status: Abnormal   Collection Time: 07/05/22  1:34 PM  Result Value Ref Range   Sodium 139 135 - 145 mmol/L   Potassium 4.7 3.5 - 5.1 mmol/L   Chloride 105 98 - 111 mmol/L   BUN 34 (H) 8 - 23 mg/dL   Creatinine, Ser 2.00 (H) 0.61 - 1.24 mg/dL   Glucose, Bld 115 (H) 70 - 99 mg/dL    Comment: Glucose reference range applies only to samples taken after fasting for at least 8 hours.   Calcium, Ion 1.11 (L) 1.15 - 1.40 mmol/L   TCO2 24 22 - 32 mmol/L   Hemoglobin 14.3 13.0 - 17.0 g/dL   HCT 42.0 39.0 - 52.0 %  I-Stat venous blood gas, ED     Status: Abnormal   Collection Time: 07/05/22  1:35 PM  Result Value Ref Range   pH, Ven 7.365 7.25 - 7.43   pCO2, Ven 43.0 (L) 44 - 60 mmHg   pO2, Ven 34 32 - 45 mmHg   Bicarbonate 24.6 20.0 - 28.0 mmol/L   TCO2 26 22 - 32 mmol/L   O2 Saturation 62 %  Acid-base deficit 1.0 0.0 - 2.0 mmol/L   Sodium 138 135 - 145 mmol/L   Potassium 4.7 3.5 - 5.1 mmol/L   Calcium, Ion 1.14 (L) 1.15 - 1.40 mmol/L   HCT 41.0 39.0 - 52.0 %   Hemoglobin 13.9 13.0 - 17.0 g/dL   Sample type VENOUS    Comment NOTIFIED PHYSICIAN   Lactic acid, plasma     Status: None   Collection Time: 07/05/22  3:18 PM  Result Value Ref Range   Lactic Acid, Venous 1.9 0.5 - 1.9 mmol/L    Comment: Performed at [place] [address], [place],  [address]   CT Head Wo Contrast  Result Date: 07/05/2022 CLINICAL DATA:  Head trauma, minor (Age >= 65y) EXAM: CT HEAD WITHOUT CONTRAST TECHNIQUE: Contiguous axial images were obtained from the base of the skull through the vertex without intravenous contrast. RADIATION DOSE REDUCTION: This exam was performed according to the  departmental dose-optimization program which includes automated exposure control, adjustment of the mA and/or kV according to patient size and/or use of iterative reconstruction technique. COMPARISON:  May 04, 2022. FINDINGS: Brain: No evidence of acute infarction, hemorrhage, hydrocephalus, extra-axial collection or mass lesion/mass effect. Patchy white matter hypodensities nonspecific but compatible with chronic microvascular ischemic disease. Vascular: No hyperdense vessel. Skull: No acute fracture. Sinuses/Orbits: Anterior right ethmoid air cell osteoma. No acute orbital findings. Other: No mastoid effusions IMPRESSION: No evidence of acute intracranial abnormality. Electronically Signed   By: Margaretha Sheffield M.D.   On: 07/05/2022 14:33   CT Cervical Spine Wo Contrast  Result Date: 07/05/2022 CLINICAL DATA:  Neck trauma EXAM: CT CERVICAL SPINE WITHOUT CONTRAST TECHNIQUE: Multidetector CT imaging of the cervical spine was performed without intravenous contrast. Multiplanar CT image reconstructions were also generated. RADIATION DOSE REDUCTION: This exam was performed according to the departmental dose-optimization program which includes automated exposure control, adjustment of the mA and/or kV according to patient size and/or use of iterative reconstruction technique. COMPARISON:  None Available. FINDINGS: Alignment: Facet joints are aligned without dislocation or traumatic listhesis. Dens and lateral masses are aligned. Degenerative facet mediated grade 1 anterolisthesis of C4 on C5. Skull base and vertebrae: No acute fracture. No primary bone lesion or focal pathologic process. Soft tissues and spinal canal: No prevertebral fluid or swelling. No visible canal hematoma. Disc levels: Degenerative disc disease of the C4-5 through C6-7 levels. Multilevel facet arthropathy. The left C4-5 facet joint is fused. Upper chest: Negative. Other: Bilateral carotid atherosclerosis. Small calcified left anterior  chain cervical lymph nodes. IMPRESSION: 1. No acute fracture or traumatic listhesis of the cervical spine. 2. Multilevel degenerative disc disease and facet arthropathy. Electronically Signed   By: Davina Poke D.O.   On: 07/05/2022 14:04   DG Chest Port 1 View  Result Date: 07/05/2022 CLINICAL DATA:  Vomiting EXAM: PORTABLE CHEST 1 VIEW COMPARISON:  CXR 05/04/22 FINDINGS: No pleural effusion. No pneumothorax. There are patchy opacities in the right mid and lower lung, as well as the left lung base. These are worrisome for multifocal infection, possibly related to aspiration. No displaced rib fractures. Visualized upper abdomen is notable for gaseous distention of the colon at the splenic flexure. IMPRESSION: Patchy opacities in the right mid and lower lung, as well as the left lung base. These are worrisome for multifocal infection, possibly related to aspiration. Electronically Signed   By: Marin Roberts M.D.   On: 07/05/2022 13:33    Pending Labs Unresulted Labs (From admission, onward)  Start     Ordered   07/06/22 2263  Basic metabolic panel  Tomorrow morning,   R        07/05/22 1557   07/06/22 0500  CBC  Tomorrow morning,   R        07/05/22 1557   07/05/22 1433  Culture, blood (routine x 2)  BLOOD CULTURE X 2,   R (with STAT occurrences)      07/05/22 1432   07/05/22 1240  Urinalysis, Routine w reflex microscopic -Urine, Catheterized  Once,   URGENT       Question:  Specimen Source  Answer:  Urine, Catheterized   07/05/22 1241            Vitals/Pain Today's Vitals   07/05/22 1330 07/05/22 1500 07/05/22 1530 07/05/22 1600  BP: (!) 154/81 125/74 96/68 (!) 106/58  Pulse: 98 95 93 100  Resp: (!) 22 (!) 24 (!) 25 20  Temp:      TempSrc:      SpO2: 95% 100% 98% 97%  Weight:      Height:      PainSc:        Isolation Precautions Airborne and Contact precautions  Medications Medications  azithromycin (ZITHROMAX) 500 mg in sodium chloride 0.9 % 250 mL IVPB (has no  administration in time range)  acetaminophen (TYLENOL) tablet 650 mg (has no administration in time range)    Or  acetaminophen (TYLENOL) suppository 650 mg (has no administration in time range)  sodium chloride 0.9 % bolus 1,000 mL (0 mLs Intravenous Stopped 07/05/22 1527)  ondansetron (ZOFRAN) injection 4 mg (4 mg Intravenous Given 07/05/22 1319)  Ampicillin-Sulbactam (UNASYN) 3 g in sodium chloride 0.9 % 100 mL IVPB (3 g Intravenous New Bag/Given 07/05/22 1526)    Mobility walks with person assist     Focused Assessments Neuro Assessment Handoff:  Swallow screen pass?  Have not done, pt was not a stroke work-up Cardiac Rhythm: Normal sinus rhythm       Neuro Assessment: Exceptions to WDL Neuro Checks:      Has TPA been given? No If patient is a Neuro Trauma and patient is going to OR before floor call report to [place] nurse: [phone] or [phone]  ,    R Recommendations: See Admitting Provider Note  Report given to:   Additional Notes:

## 2022-07-05 NOTE — Hospital Course (Addendum)
Acute encephalopathy secondary to underlying pneumonia Moderate dementia Bipolar disorder Patient presented with a few days of worsening confusion, weakness and was found down in a pool of dark vomit.  He is alert and oriented x 1-2 at baseline and can perform some of his ADLs.  Workup was concerning for aspiration pneumonia, troponinemia, AKI with mildly elevated BUN.  Of note, he is also on several centrally acting medications including Ativan, Seroquel,, Lamictal.  He does have some focal neurological deficits including weak Grip strength on the right hand as well as worsening aphasia, word finding difficulties, and gait/balance abnormalities which his wife stated started earlier this week, although MRI ruled out acute intracranial abnormality. The patient was treated with IV unasyn for 3 days and was discharged with Augmentin for 4 days, to complete a 7 day course for aspiration pneumonia.   Demand ischemia in setting of multifocal pneumonia and A-fib with RVR Afib on warfarin s/p prior ablation Patient was noted to have elevated troponins up to 3000's during this admission.  However, patient denies any chest pain and his EKG did not show any acute ischemic changes.  Suspect this is demand ischemia in the setting of his aspiration pneumonia and A-fib with RVR. Patient was not a candidate for any ischemic evaluation per cardiology. He was on metoprolol 50 mg bid and warfarin at home for afib. Continued on metoprolol and started on amiodarone per cardiology. Converted to normal sinus rhythm the day prior to discharge. Warfarin was discontinued by cardiology, as he is high risk for bleeding.   AKI on CKD 3B Recent baseline creatinine approximately 1.6.  Presenting with CR 1.95, BUN elevated 29.  Likely prerenal in the setting of vomiting, but could also have GI losses given concern for hematemesis. Cr improved to 1.3 on the day of discharge.   Acute blood loss anemia  Patient was found down initially  in a pool of dark vomit, concerning for GI bleed. He had no further episodes of hematemesis this adimssion and Hb was stable. GI evaluated the patient and did not plan for any endoscopic evaluation. Warfarin discontinued in the setting of high risk bleeding, as noted above.    Hypothyroidism Continued home Synthroid 125  Goals of care Discussed [place] with family at bedside on 1/26- patient is DNR, however, they would like to continue full scope of care and are not interested in discussions with palliative care at this time. Patient is certainly a risk for continually aspirating and developing aspiration pneumonia. Additionally, anticoagulation will be an ongoing issue, as it is being held given his high risk of bleeding, despite his hx of afib

## 2022-07-06 ENCOUNTER — Observation Stay (HOSPITAL_BASED_OUTPATIENT_CLINIC_OR_DEPARTMENT_OTHER): Payer: Medicare Other

## 2022-07-06 ENCOUNTER — Other Ambulatory Visit (HOSPITAL_COMMUNITY): Payer: Medicare Other

## 2022-07-06 DIAGNOSIS — I4891 Unspecified atrial fibrillation: Secondary | ICD-10-CM

## 2022-07-06 DIAGNOSIS — R7989 Other specified abnormal findings of blood chemistry: Secondary | ICD-10-CM | POA: Diagnosis not present

## 2022-07-06 DIAGNOSIS — D61818 Other pancytopenia: Secondary | ICD-10-CM | POA: Diagnosis not present

## 2022-07-06 DIAGNOSIS — G9341 Metabolic encephalopathy: Secondary | ICD-10-CM | POA: Diagnosis present

## 2022-07-06 DIAGNOSIS — J690 Pneumonitis due to inhalation of food and vomit: Secondary | ICD-10-CM | POA: Diagnosis present

## 2022-07-06 DIAGNOSIS — I2510 Atherosclerotic heart disease of native coronary artery without angina pectoris: Secondary | ICD-10-CM | POA: Diagnosis present

## 2022-07-06 DIAGNOSIS — Z931 Gastrostomy status: Secondary | ICD-10-CM | POA: Diagnosis not present

## 2022-07-06 DIAGNOSIS — J9601 Acute respiratory failure with hypoxia: Secondary | ICD-10-CM | POA: Diagnosis present

## 2022-07-06 DIAGNOSIS — F319 Bipolar disorder, unspecified: Secondary | ICD-10-CM | POA: Diagnosis present

## 2022-07-06 DIAGNOSIS — E7800 Pure hypercholesterolemia, unspecified: Secondary | ICD-10-CM | POA: Diagnosis present

## 2022-07-06 DIAGNOSIS — I130 Hypertensive heart and chronic kidney disease with heart failure and stage 1 through stage 4 chronic kidney disease, or unspecified chronic kidney disease: Secondary | ICD-10-CM | POA: Diagnosis present

## 2022-07-06 DIAGNOSIS — E440 Moderate protein-calorie malnutrition: Secondary | ICD-10-CM | POA: Diagnosis present

## 2022-07-06 DIAGNOSIS — I452 Bifascicular block: Secondary | ICD-10-CM | POA: Diagnosis present

## 2022-07-06 DIAGNOSIS — F03B0 Unspecified dementia, moderate, without behavioral disturbance, psychotic disturbance, mood disturbance, and anxiety: Secondary | ICD-10-CM | POA: Diagnosis not present

## 2022-07-06 DIAGNOSIS — I480 Paroxysmal atrial fibrillation: Secondary | ICD-10-CM | POA: Diagnosis not present

## 2022-07-06 DIAGNOSIS — F03B3 Unspecified dementia, moderate, with mood disturbance: Secondary | ICD-10-CM | POA: Diagnosis present

## 2022-07-06 DIAGNOSIS — K920 Hematemesis: Secondary | ICD-10-CM | POA: Diagnosis present

## 2022-07-06 DIAGNOSIS — I252 Old myocardial infarction: Secondary | ICD-10-CM | POA: Diagnosis not present

## 2022-07-06 DIAGNOSIS — Z923 Personal history of irradiation: Secondary | ICD-10-CM | POA: Diagnosis not present

## 2022-07-06 DIAGNOSIS — I2489 Other forms of acute ischemic heart disease: Secondary | ICD-10-CM | POA: Diagnosis present

## 2022-07-06 DIAGNOSIS — I080 Rheumatic disorders of both mitral and aortic valves: Secondary | ICD-10-CM | POA: Diagnosis present

## 2022-07-06 DIAGNOSIS — N1832 Chronic kidney disease, stage 3b: Secondary | ICD-10-CM | POA: Diagnosis present

## 2022-07-06 DIAGNOSIS — E039 Hypothyroidism, unspecified: Secondary | ICD-10-CM | POA: Diagnosis present

## 2022-07-06 DIAGNOSIS — I4819 Other persistent atrial fibrillation: Secondary | ICD-10-CM | POA: Diagnosis not present

## 2022-07-06 DIAGNOSIS — N179 Acute kidney failure, unspecified: Secondary | ICD-10-CM | POA: Diagnosis present

## 2022-07-06 DIAGNOSIS — Z1152 Encounter for screening for COVID-19: Secondary | ICD-10-CM | POA: Diagnosis not present

## 2022-07-06 DIAGNOSIS — Z66 Do not resuscitate: Secondary | ICD-10-CM | POA: Diagnosis present

## 2022-07-06 DIAGNOSIS — R4701 Aphasia: Secondary | ICD-10-CM | POA: Diagnosis present

## 2022-07-06 DIAGNOSIS — R410 Disorientation, unspecified: Secondary | ICD-10-CM | POA: Diagnosis present

## 2022-07-06 DIAGNOSIS — D62 Acute posthemorrhagic anemia: Secondary | ICD-10-CM | POA: Diagnosis not present

## 2022-07-06 LAB — BASIC METABOLIC PANEL
Anion gap: 6 (ref 5–15)
BUN: 28 mg/dL — ABNORMAL HIGH (ref 8–23)
CO2: 25 mmol/L (ref 22–32)
Calcium: 8.3 mg/dL — ABNORMAL LOW (ref 8.9–10.3)
Chloride: 108 mmol/L (ref 98–111)
Creatinine, Ser: 1.62 mg/dL — ABNORMAL HIGH (ref 0.61–1.24)
GFR, Estimated: 43 mL/min — ABNORMAL LOW (ref >60)
Glucose, Bld: 107 mg/dL — ABNORMAL HIGH (ref 70–99)
Potassium: 4.1 mmol/L (ref 3.5–5.1)
Sodium: 139 mmol/L (ref 135–145)

## 2022-07-06 LAB — TROPONIN I (HIGH SENSITIVITY)
Troponin I (High Sensitivity): 3064 ng/L (ref <18)
Troponin I (High Sensitivity): 2949 ng/L (ref <18)
Troponin I (High Sensitivity): 2604 ng/L (ref <18)

## 2022-07-06 LAB — CBC
HCT: 35.4 % — ABNORMAL LOW (ref 39.0–52.0)
Hemoglobin: 11.2 g/dL — ABNORMAL LOW (ref 13.0–17.0)
MCH: 28.5 pg (ref 26.0–34.0)
MCHC: 31.6 g/dL (ref 30.0–36.0)
MCV: 90.1 fL (ref 80.0–100.0)
Platelets: 138 10*3/uL — ABNORMAL LOW (ref 150–400)
RBC: 3.93 MIL/uL — ABNORMAL LOW (ref 4.22–5.81)
RDW: 15.5 % (ref 11.5–15.5)
WBC: 9.9 10*3/uL (ref 4.0–10.5)
nRBC: 0 % (ref 0.0–0.2)

## 2022-07-06 LAB — ECHOCARDIOGRAM COMPLETE
Area-P 1/2: 5.16 cm2
Calc EF: 59.9 %
Height: 67 in
P 1/2 time: 443 msec
S' Lateral: 3.1 cm
Single Plane A2C EF: 56.7 %
Single Plane A4C EF: 58.1 %
Weight: 3350.99 oz

## 2022-07-06 MED ORDER — ORAL CARE MOUTH RINSE: 15.0000 mL | OROMUCOSAL | Status: DC | PRN

## 2022-07-06 MED ORDER — AMIODARONE HCL IN DEXTROSE 360-4.14 MG/200ML-% IV SOLN
30.0000 mg/h | INTRAVENOUS | Status: DC
  Administered 2022-07-06 – 2022-07-08 (×4): 30 mg/h via INTRAVENOUS
  Filled 2022-07-06 (×4): qty 200

## 2022-07-06 MED ORDER — ASPIRIN 81 MG PO CHEW
81.0000 mg | CHEWABLE_TABLET | Freq: Every day | ORAL | Status: DC
  Administered 2022-07-06 – 2022-07-08 (×3): 81 mg via ORAL
  Filled 2022-07-06 (×3): qty 1

## 2022-07-06 MED ORDER — GUAIFENESIN ER 600 MG PO TB12
600.0000 mg | ORAL_TABLET | Freq: Two times a day (BID) | ORAL | Status: DC | PRN
  Administered 2022-07-06 – 2022-07-08 (×2): 600 mg via ORAL
  Filled 2022-07-06 (×2): qty 1

## 2022-07-06 MED ORDER — AMIODARONE HCL IN DEXTROSE 360-4.14 MG/200ML-% IV SOLN
60.0000 mg/h | INTRAVENOUS | Status: AC
  Administered 2022-07-06 (×2): 60 mg/h via INTRAVENOUS
  Filled 2022-07-06 (×2): qty 200

## 2022-07-06 MED ORDER — KETOROLAC TROMETHAMINE 15 MG/ML IJ SOLN: 15.0000 mg | Freq: Once | INTRAMUSCULAR | Status: DC

## 2022-07-06 MED ORDER — ADULT MULTIVITAMIN W/MINERALS CH
1.0000 | ORAL_TABLET | Freq: Every day | ORAL | Status: DC
  Administered 2022-07-07 – 2022-07-08 (×2): 1 via ORAL
  Filled 2022-07-06 (×2): qty 1

## 2022-07-06 MED ORDER — AMIODARONE LOAD VIA INFUSION
150.0000 mg | Freq: Once | INTRAVENOUS | Status: AC
  Administered 2022-07-06: 150 mg via INTRAVENOUS
  Filled 2022-07-06: qty 83.34

## 2022-07-06 NOTE — Progress Notes (Signed)
Subjective:   Summary: [name] is a 82 y.o. year old male currently admitted on the IMTS HD#0 for acute multifactorial encephalopathy.  Overnight Events: NAEON.  MRI negative for acute infarct.  Respiratory status remained stable on 3 to 4 L nasal cannula.  Of note, had been evaluated by cardiology prior to our visit and placed on amiodarone drip with ASA 81 started as well.  Echo was ordered to further evaluate for regional wall motion abnormalities.  Says he is not hurting one bit. Breathing is "real good." Wants to go home. Alert to person, place, and event but not time.  Interactive and follows instructions.  Wife was not in the room today during our assessment  Objective:  Vital signs in last 24 hours: Vitals:   07/06/22 0029 07/06/22 0314 07/06/22 0315 07/06/22 0345  BP: 106/69   105/89  Pulse: 91 (!) 101 (!) 103 (!) 107  Resp:    20  Temp:    98.6 F (37 C)  TempSrc:    Oral  SpO2: 94% (!) 85% 95% 99%  Weight:      Height:       Supplemental O2: Nasal Cannula SpO2: 99 % O2 Flow Rate (L/min): 4 L/min   Physical Exam:  Constitutional: chronically ill-appearing elderly male laying in bed, in no acute distress  Cardiovascular: RRR regular rate and rhythm, 4/6 systolic murmur right upper sternal border  Pulmonary/Chest: normal work of breathing on room air, mild rhonchi but otherwise lungs clear to auscultation bilaterally Abdominal: soft, non-tender, non-distended Skin: warm and dry Extremities: upper/lower extremity pulses 2+, no lower extremity edema present Neurological: alert & oriented to self, place, and situation but not time. lifts upper extremities against gravity; grip strength slightly weaker on R; lifts both legs against gravity  Follows commands intermittently  Psych: pleasantly confused    Filed Weights   07/05/22 1217  Weight: 95 kg     Intake/Output Summary (Last 24 hours) at 07/06/2022 0737 Last data filed at  07/06/2022 0454 Gross per 24 hour  Intake 2102.38 ml  Output 400 ml  Net 1702.38 ml   Net IO Since Admission: 1,702.38 mL [07/06/22 0737]  Pertinent Labs:    Latest Ref Rng & Units 07/06/2022    3:16 AM 07/05/2022    1:35 PM 07/05/2022    1:34 PM  CBC  WBC 4.0 - 10.5 K/uL 9.9     Hemoglobin 13.0 - 17.0 g/dL 11.2  13.9  14.3   Hematocrit 39.0 - 52.0 % 35.4  41.0  42.0   Platelets 150 - 400 K/uL 138          Latest Ref Rng & Units 07/06/2022    3:16 AM 07/05/2022    1:35 PM 07/05/2022    1:34 PM  CMP  Glucose 70 - 99 mg/dL 107   115   BUN 8 - 23 mg/dL 28   34   Creatinine 0.61 - 1.24 mg/dL 1.62   2.00   Sodium 135 - 145 mmol/L 139  138  139   Potassium 3.5 - 5.1 mmol/L 4.1  4.7  4.7   Chloride 98 - 111 mmol/L 108   105   CO2 22 - 32 mmol/L 25     Calcium 8.9 - 10.3 mg/dL 8.3      PT 22.3, INR 2 Troponins 1050, 1667, 2468, 3064 Lactic acid 1.9, 1.9 Lipase  23 VBG with pH 7.365 BCx in process Ethanol undetectable  Imaging: MR BRAIN WO CONTRAST  Result Date: 07/05/2022 CLINICAL DATA:  Neuro deficit, acute, stroke suspected Mental status change, unknown cause EXAM: MRI HEAD WITHOUT CONTRAST TECHNIQUE: Multiplanar, multiecho pulse sequences of the brain and surrounding structures were obtained without intravenous contrast. COMPARISON:  Same day CT head. FINDINGS: Brain: No acute infarction, hemorrhage, hydrocephalus, extra-axial collection or mass lesion. Chronic microvascular ischemic disease. Vascular: Major arterial flow voids are maintained at the skull base. Skull and upper cervical spine: Normal marrow signal. Sinuses/Orbits: Negative. Other: No sizable mastoid effusions. IMPRESSION: No evidence of acute intracranial abnormality. Electronically Signed   By: Margaretha Sheffield M.D.   On: 07/05/2022 18:58   CT Head Wo Contrast  Result Date: 07/05/2022 CLINICAL DATA:  Head trauma, minor (Age >= 65y) EXAM: CT HEAD WITHOUT CONTRAST TECHNIQUE: Contiguous axial images were obtained  from the base of the skull through the vertex without intravenous contrast. RADIATION DOSE REDUCTION: This exam was performed according to the departmental dose-optimization program which includes automated exposure control, adjustment of the mA and/or kV according to patient size and/or use of iterative reconstruction technique. COMPARISON:  May 04, 2022. FINDINGS: Brain: No evidence of acute infarction, hemorrhage, hydrocephalus, extra-axial collection or mass lesion/mass effect. Patchy white matter hypodensities nonspecific but compatible with chronic microvascular ischemic disease. Vascular: No hyperdense vessel. Skull: No acute fracture. Sinuses/Orbits: Anterior right ethmoid air cell osteoma. No acute orbital findings. Other: No mastoid effusions IMPRESSION: No evidence of acute intracranial abnormality. Electronically Signed   By: Margaretha Sheffield M.D.   On: 07/05/2022 14:33   CT Cervical Spine Wo Contrast  Result Date: 07/05/2022 CLINICAL DATA:  Neck trauma EXAM: CT CERVICAL SPINE WITHOUT CONTRAST TECHNIQUE: Multidetector CT imaging of the cervical spine was performed without intravenous contrast. Multiplanar CT image reconstructions were also generated. RADIATION DOSE REDUCTION: This exam was performed according to the departmental dose-optimization program which includes automated exposure control, adjustment of the mA and/or kV according to patient size and/or use of iterative reconstruction technique. COMPARISON:  None Available. FINDINGS: Alignment: Facet joints are aligned without dislocation or traumatic listhesis. Dens and lateral masses are aligned. Degenerative facet mediated grade 1 anterolisthesis of C4 on C5. Skull base and vertebrae: No acute fracture. No primary bone lesion or focal pathologic process. Soft tissues and spinal canal: No prevertebral fluid or swelling. No visible canal hematoma. Disc levels: Degenerative disc disease of the C4-5 through C6-7 levels. Multilevel facet  arthropathy. The left C4-5 facet joint is fused. Upper chest: Negative. Other: Bilateral carotid atherosclerosis. Small calcified left anterior chain cervical lymph nodes. IMPRESSION: 1. No acute fracture or traumatic listhesis of the cervical spine. 2. Multilevel degenerative disc disease and facet arthropathy. Electronically Signed   By: Davina Poke D.O.   On: 07/05/2022 14:04   DG Chest Port 1 View  Result Date: 07/05/2022 CLINICAL DATA:  Vomiting EXAM: PORTABLE CHEST 1 VIEW COMPARISON:  CXR 05/04/22 FINDINGS: No pleural effusion. No pneumothorax. There are patchy opacities in the right mid and lower lung, as well as the left lung base. These are worrisome for multifocal infection, possibly related to aspiration. No displaced rib fractures. Visualized upper abdomen is notable for gaseous distention of the colon at the splenic flexure. IMPRESSION: Patchy opacities in the right mid and lower lung, as well as the left lung base. These are worrisome for multifocal infection, possibly related to aspiration. Electronically Signed   By: Marin Roberts M.D.   On: 07/05/2022  13:33     EKG: Initial EKG showed sinus tachycardia, prolonged PR, right bundle branch block.  Repeat EKG obtained last night consistent with A-fib RVR with right bundle branch block.  Assessment/Plan:   Principal Problem:   Metabolic encephalopathy Active Problems:   Aspiration pneumonia [facility])   Patient Summary: [name] is a 82 y.o. person living with a history of afib on warfarin, hypertension, bipolar disorder, and dementia who presented with increased confusion and weakness and admitted for acute metabolic encephalopathy on hospital day 0   Acute multifactorial encephalopathy Moderate dementia Bipolar disorder Patient presents with a few days of worsening confusion, weakness and was found down this morning in a pool of dark vomit.  He is alert and oriented x 1-2 at baseline and can perform some of his  ADLs.  Workup so far is concerning for aspiration pneumonia, troponinemia, AKI which resolved overnight.  Of note, he is also on several centrally acting medications including Ativan, Seroquel, Lamictal.  He has no clear electrolyte abnormalities.  BG is normal on BMPs.  There there was initial concern for intoxication, ethanol level was unremarkable.  MRI overnight ruled out stroke.  Low concern for seizure, did not have any bowel bladder incontinence or oral/tongue biting marks.  Etiology likely multifactorial due to some combination of the above and baseline worsening progression of his dementia. -Aspiration pneumonia treatment below - MRI negative for acute infarct. - AKI management as below, resolved - Troponinemia management below, cardiology following - Will continue Seroquel, Lamictal for bipolar.  He does take scheduled Ativan daily.  Unsure about necessity of this but will continue due to concern for withdrawals if abruptly stopped. - Ethanol unremarkable - Will consider palliative care consult for further goals of care discussion.   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  He is presenting after being found in bed with dark/reddish colored vomit around him.  He does have evidence of multifocal pneumonia on his chest x-ray.  Respiratory status is stable on 3 L nasal cannula, he is not on any supplemental oxygen at home.  Was given a dose of Unasyn and azithromycin in the ED.  Will continue Unasyn for 5-7 day course to cover likely aspiration pneumonia.  He did pass bedside swallow, but given risk factors will need formal SLP eval. hemoglobin dropped to 11.2 today.  Consulted GI this morning for further routine evaluation.  Overall, unfortunately if he does have oropharyngeal dysfunction  either from prior cancer treatment or from advancing dementia, not a lot of great treatment options. - Continue Unasyn 3 g 4 times daily for 5 to 7 days - N.p.o. sips and chips, follow-up SLP eval - Start IV Protonix 40 mg twice daily -Follow-up GI recs   AKI on CKD 3B Recent baseline creatinine approximately 1.6.  Presenting with Cr 1.95, BUN elevated 29.  Likely prerenal in the setting of vomiting, but could also have GI losses given concern for hematemesis.  Creatinine normalized to 1.62 this morning after initial fluid resuscitation - Trend BMP -Continue IV fluids - Continue outpatient nephrology follow-up   Afib on warfarin s/p prior ablation On metoprolol tartrate 50 mg twice daily, warfarin for anticoagulation because he was unable to consistently afford DOAC.  His INRs fluctuate.  His initial EKG was consistent with sinus tachycardia.  Not a great candidate for warfarin given  his baseline cognitive status.  He is undergoing evaluation for watchman and actually has a procedure scheduled.  Will follow cardiology recommendations regarding this.  Of note, he did go into A-fib with RVR overnight and was started on amiodarone drip. cardiology has ordered echo to further evaluate for ischemic disease, but he is likely not a great candidate for invasive workup. -Continue metoprolol tartrate 50 mg twice daily, amiodarone drip - Holding warfarin in the setting of possible hematemesis, ASA 81 started by cardiology - Follow-up echo - Appreciate other cardiology recommendations   CAD, HTN, HLD Troponinemia Has uptrending troponins in the setting of being found down.  No concerning anginal symptoms or EKG changes currently, likely demand ischemia.  Cardiology following. - Continue to trend troponins - Continue metoprolol as above - Continue Crestor 20 - Appreciate cardiology recs   Hypothyroidism Continue Synthroid 125  Diet: NPO IVF: LR,75cc/hr VTE:  None Code: DNR PT/OT recs: Home  health with 24/7 supervision TOC recs:  Family Update:   Dispo: Anticipated discharge home pending clinical stability  Linus Galas, MD PGY-1 Internal Medicine Resident Please contact the on call pager after 5 pm and on weekends at [phone].

## 2022-07-06 NOTE — Progress Notes (Signed)
Went to discuss goals of care with patient and his wife at bedside.  Patient was alert and oriented x 3 at time of conversation.  Discussed current management plan including treatment for aspiration pneumonia, cardiology management of A-fib with RVR and cessation of anticoagulation at least temporarily as well as possible reevaluation of previously scheduled Watchman procedure.  Overall, it seems like he has persistent dysphagia likely secondary to some combination of previous radiation therapy to the oropharynx and moderate progressing dementia.  He is at high risk of continually aspirating and developing aspiration pneumonia.  Anticoagulation is also an ongoing issue as it certainly would not be safe at this point for him to continue being on warfarin.  Voiced all of these concerns and the patient and his wife stated understanding.  Discussed the possibility of changing scope of care and they said they had been thinking about it but at this time would like to continue full scope care and remain DNR status.  Offered to have them discuss with palliative care team for further information and they declined at this time,, but mentioned that they will further discuss amongst themselves and let us know if they change their mind.  Talked about hospice at home as a possibility down the road and they said they would consider that in the future but likely not after this hospitalization.  Signed DNR form and also provided on signed form for the patient's wife to take to her physician and fill out. - Remains DNR and full scope of care - Not interested in discussing goals of care with palliative at this time or being discharged when stable with hospice at home.

## 2022-07-06 NOTE — Progress Notes (Signed)
Rounding Note    Patient Name: [name] Date of Encounter: 07/06/2022  [facility] Cardiologist: Dr Quentin Ore  Subjective   Patient denies chest pain or dyspnea.  He is alert and oriented to place and person.  He states "stop with the silly questions".  Inpatient Medications    Scheduled Meds:  lamoTRIgine  100 mg Oral QHS   levothyroxine  125 mcg Oral Daily   LORazepam  1 mg Oral BID   metoprolol tartrate  50 mg Oral BID   pantoprazole (PROTONIX) IV  40 mg Intravenous Q12H   QUEtiapine  200 mg Oral QPM   rosuvastatin  10 mg Oral QPM   Continuous Infusions:  ampicillin-sulbactam (UNASYN) IV 3 g (07/06/22 0750)   lactated ringers 75 mL/hr at 07/06/22 0454   PRN Meds: acetaminophen **OR** acetaminophen, polyethylene glycol, senna-docusate   Vital Signs    Vitals:   07/06/22 0029 07/06/22 0314 07/06/22 0315 07/06/22 0345  BP: 106/69   105/89  Pulse: 91 (!) 101 (!) 103 (!) 107  Resp:    20  Temp:    98.6 F (37 C)  TempSrc:    Oral  SpO2: 94% (!) 85% 95% 99%  Weight:      Height:        Intake/Output Summary (Last 24 hours) at 07/06/2022 0753 Last data filed at 07/06/2022 0454 Gross per 24 hour  Intake 2102.38 ml  Output 400 ml  Net 1702.38 ml      07/05/2022   12:17 PM 05/09/2022    2:45 PM 05/08/2022    2:58 PM  Last 3 Weights  Weight (lbs) 209 lb 7 oz 211 lb 205 lb  Weight (kg) 95 kg 95.709 kg 92.987 kg      Telemetry    Atrial fibrillation with rapid ventricular response- Personally Reviewed   Physical Exam   GEN: No acute distress.  Chronically ill-appearing Neck: No JVD Cardiac: Irregular and tachycardic Respiratory: Clear to auscultation bilaterally. GI: Soft, nontender, non-distended  MS: No edema Neuro:  Nonfocal  Psych: Normal affect   Labs    High Sensitivity Troponin:   Recent Labs  Lab 07/05/22 1322 07/05/22 1515 07/05/22 1916 07/06/22 0316  TROPONINIHS 1,050* 1,667* 2,468* 3,064*      Chemistry Recent Labs  Lab 07/05/22 1322 07/05/22 1334 07/05/22 1335 07/06/22 0316  NA 140 139 138 139  K 4.6 4.7 4.7 4.1  CL 107 105  --  108  CO2 24  --   --  25  GLUCOSE 125* 115*  --  107*  BUN 29* 34*  --  28*  CREATININE 1.95* 2.00*  --  1.62*  CALCIUM 8.9  --   --  8.3*  PROT 6.0*  --   --   --   ALBUMIN 3.4*  --   --   --   AST 31  --   --   --   ALT 17  --   --   --   ALKPHOS 47  --   --   --   BILITOT 0.6  --   --   --   GFRNONAA 34*  --   --  43*  ANIONGAP 9  --   --  6   Hematology Recent Labs  Lab 07/05/22 1322 07/05/22 1334 07/05/22 1335 07/06/22 0316  WBC 9.1  --   --  9.9  RBC 4.71  --   --  3.93*  HGB 13.5 14.3 13.9  11.2*  HCT 42.4 42.0 41.0 35.4*  MCV 90.0  --   --  90.1  MCH 28.7  --   --  28.5  MCHC 31.8  --   --  31.6  RDW 15.1  --   --  15.5  PLT 151  --   --  138*    Radiology    MR BRAIN WO CONTRAST  Result Date: 07/05/2022 CLINICAL DATA:  Neuro deficit, acute, stroke suspected Mental status change, unknown cause EXAM: MRI HEAD WITHOUT CONTRAST TECHNIQUE: Multiplanar, multiecho pulse sequences of the brain and surrounding structures were obtained without intravenous contrast. COMPARISON:  Same day CT head. FINDINGS: Brain: No acute infarction, hemorrhage, hydrocephalus, extra-axial collection or mass lesion. Chronic microvascular ischemic disease. Vascular: Major arterial flow voids are maintained at the skull base. Skull and upper cervical spine: Normal marrow signal. Sinuses/Orbits: Negative. Other: No sizable mastoid effusions. IMPRESSION: No evidence of acute intracranial abnormality. Electronically Signed   By: Margaretha Sheffield M.D.   On: 07/05/2022 18:58   CT Head Wo Contrast  Result Date: 07/05/2022 CLINICAL DATA:  Head trauma, minor (Age >= 65y) EXAM: CT HEAD WITHOUT CONTRAST TECHNIQUE: Contiguous axial images were obtained from the base of the skull through the vertex without intravenous contrast. RADIATION DOSE REDUCTION: This  exam was performed according to the departmental dose-optimization program which includes automated exposure control, adjustment of the mA and/or kV according to patient size and/or use of iterative reconstruction technique. COMPARISON:  May 04, 2022. FINDINGS: Brain: No evidence of acute infarction, hemorrhage, hydrocephalus, extra-axial collection or mass lesion/mass effect. Patchy white matter hypodensities nonspecific but compatible with chronic microvascular ischemic disease. Vascular: No hyperdense vessel. Skull: No acute fracture. Sinuses/Orbits: Anterior right ethmoid air cell osteoma. No acute orbital findings. Other: No mastoid effusions IMPRESSION: No evidence of acute intracranial abnormality. Electronically Signed   By: Margaretha Sheffield M.D.   On: 07/05/2022 14:33   CT Cervical Spine Wo Contrast  Result Date: 07/05/2022 CLINICAL DATA:  Neck trauma EXAM: CT CERVICAL SPINE WITHOUT CONTRAST TECHNIQUE: Multidetector CT imaging of the cervical spine was performed without intravenous contrast. Multiplanar CT image reconstructions were also generated. RADIATION DOSE REDUCTION: This exam was performed according to the departmental dose-optimization program which includes automated exposure control, adjustment of the mA and/or kV according to patient size and/or use of iterative reconstruction technique. COMPARISON:  None Available. FINDINGS: Alignment: Facet joints are aligned without dislocation or traumatic listhesis. Dens and lateral masses are aligned. Degenerative facet mediated grade 1 anterolisthesis of C4 on C5. Skull base and vertebrae: No acute fracture. No primary bone lesion or focal pathologic process. Soft tissues and spinal canal: No prevertebral fluid or swelling. No visible canal hematoma. Disc levels: Degenerative disc disease of the C4-5 through C6-7 levels. Multilevel facet arthropathy. The left C4-5 facet joint is fused. Upper chest: Negative. Other: Bilateral carotid  atherosclerosis. Small calcified left anterior chain cervical lymph nodes. IMPRESSION: 1. No acute fracture or traumatic listhesis of the cervical spine. 2. Multilevel degenerative disc disease and facet arthropathy. Electronically Signed   By: Davina Poke D.O.   On: 07/05/2022 14:04   DG Chest Port 1 View  Result Date: 07/05/2022 CLINICAL DATA:  Vomiting EXAM: PORTABLE CHEST 1 VIEW COMPARISON:  CXR 05/04/22 FINDINGS: No pleural effusion. No pneumothorax. There are patchy opacities in the right mid and lower lung, as well as the left lung base. These are worrisome for multifocal infection, possibly related to aspiration. No displaced rib fractures. Visualized upper abdomen  is notable for gaseous distention of the colon at the splenic flexure. IMPRESSION: Patchy opacities in the right mid and lower lung, as well as the left lung base. These are worrisome for multifocal infection, possibly related to aspiration. Electronically Signed   By: Marin Roberts M.D.   On: 07/05/2022 13:33    Patient Profile     82 year old male with past medical history of coronary artery disease based on coronary calcification, paroxysmal atrial fibrillation status post ablation on chronic Coumadin, hypertension, hyperlipidemia, mild aortic stenosis, renal insufficiency, bipolar disorder, dementia admitted with increasing confusion and agitation for evaluation of elevated troponin. Most recent echocardiogram September 2023 showed normal LV function, moderate left ventricular hypertrophy, grade 1 diastolic dysfunction, mild to moderate left atrial enlargement, mild mitral regurgitation, mild aortic insufficiency, mild aortic stenosis. Patient had CTA December 2023 in anticipation of watchman placement. Calcium score was noted to be 1875 which was 84th percentile. Per patient's wife he has had slow decline over the past several years with worsening confusion. He has "good days and bad days". He does have some dyspnea. There are  days when he stays in bed due to his bipolar disorder. Over the past 4 to 5 days she has noticed increasing weakness and confusion. There has been no increased dyspnea or chest pain by his report.  Patient was admitted due to progressive weakness and inability to get out of bed.  Troponin mildly elevated and cardiology asked to evaluate.  Assessment & Plan    1 elevated troponin-occurring in the setting of probable aspiration pneumonia and renal insufficiency.  Patient has not had chest pain.  Admission electrocardiogram did not show diagnostic ST changes.  Patient denies chest pain.  Electrocardiogram shows no acute ST changes.  Await follow-up echocardiogram for LV function.  At present I do not think he is a good candidate for aggressive ischemia evaluation.     2 history of paroxysmal atrial fibrillation-patient has developed recurrent atrial fibrillation with rapid ventricular response.  Will begin IV amiodarone for rate control and to potentially reestablish sinus rhythm.  His blood pressure is borderline.  Given his overall medical condition I do not think he is a good candidate for long-term anticoagulation and would discontinue Coumadin.  He was being considered for Watchman device but at present I do not think he is a good candidate for this procedure.  Can reconsider if he improves.   3 coronary calcification-noted on recent CT scan.  Continue statin.  If Coumadin is discontinued will treat with aspirin 81 mg daily.  If he makes significant improvement could consider outpatient nuclear study to screen for ischemia.  However he appears to be declining significantly per his wife.  Best option may be medical therapy.   4 hypertension-blood pressure borderline.  Will follow.   5 hyperlipidemia-continue statin.   6 chronic stage IIIb kidney disease-follow renal function while in house.  7 aspiration pneumonia-antibiotics per primary service.  8 encephalopathy/bipolar disorder  Patient may  benefit from palliative care consult.  He has had progressive decline at home per his wife.  For questions or updates, please contact [place] Please consult [email or web address] for contact info under        Signed, Kirk Ruths, MD  07/06/2022, 7:53 AM

## 2022-07-06 NOTE — Consult Note (Addendum)
Referring Provider: IMTS Primary Care Physician:  [name], MD Primary Gastroenterologist:  Dr. Lyndel Safe  Reason for Consultation:  CGE  HPI: [name] is a 82 y.o. male with dementia, hypertension, afib on warfarin, bipolar disorder, previous hx of throat cancer s/p chemo and radiation, who presented after being found down by his family.  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  There are reports vomiting some dark material when he came in.  When I saw the patient there was no family at bedside.  Patient was adamant that he did not have any episodes of vomiting dark colored material and kept asking "who keeps saying that".  He denies any abdominal pain.  Does tell me he may be occasional heartburn or reflux.  Hemoglobin 11.2 g, which is down a couple grams from when he first came in.  INR was 2.0 yesterday.  Troponin over 3000.  EGD 07/2019 by Dr. Lyndel Safe showed benign-appearing esophageal stenosis with surrounding esophagitis, small hiatal hernia, and gastritis.  1. Surgical [P], random gastric sites - GASTRIC ANTRAL AND OXYNTIC MUCOSA WITH NO SPECIFIC HISTOPATHOLOGIC CHANGES - WARTHIN STARRY STAIN IS NEGATIVE FOR HELICOBACTER PYLORI 2. Surgical [P], distal esophagus - ESOPHAGEAL SQUAMOUS MUCOSA WITH MILD VASCULAR CONGESTION, AND FOCAL SQUAMOUS BALLOONING, SUGGESTIVE OF REFLUX ESOPHAGITIS - NEGATIVE FOR INCREASED INTRAEPITHELIAL EOSINOPHILS  EGD in 01/2021 at [facility] without esophageal abnormality to explain dysphagia. Normal FLIP.   Past Medical History:  Diagnosis Date   Acquired hypothyroidism 08/17/2014   Acute MI, true posterior wall, subsequent episode of care ([place]) 08/15/2014   EF 44% with mildly reduced LV function  Formatting of this note might be different from the original. EF 44% with mildly reduced LV  function   AKI (acute kidney injury) ([place]) 09/20/2014   Anxiety    Atrial fibrillation ([place]) 09/28/2019   ablation in past  Formatting of this note might be different from the original. ablation in past   Bipolar disorder [facility])    w/dementia/psychotic episodes   Bradycardia 09/17/2014   Cancer ([place]) 09/28/2019   Prostate Cancer; Throat Cancer  Formatting of this note might be different from the original. Prostate Cancer; Throat Cancer   Cardiac murmur 09/28/2019   Cataract    Cervical radicular pain 07/16/2019   Cervical spondylosis 06/19/2019   Chest pain with high risk for cardiac etiology 08/19/2014   Chronic depression    Chronic pain due to trauma 07/16/2019   Corn of toe 12/11/2018   Coronary artery calcification 07/25/2021   Coronary artery disease 08/10/2014   DDD (degenerative disc disease), cervical 09/29/2018   Degenerative lumbar disc    Dementia without behavioral disturbance ([place]) 07/16/2019   Disease characterized by destruction of skeletal muscle 08/15/2014   Essential hypertension 09/28/2019   Essential tremor 09/21/2014   History of MI (myocardial infarction) 09/14/2014   Hypercholesteremia    Hypertension    Hypotension 09/14/2014   Long term (current) use of anticoagulants 11/14/2021   Malignant neoplasm of prostate ([place])    Mild aortic stenosis 11/26/2019   Mood change 10/12/2019   Myocardial infarction ([place]) 08/10/2014   Nausea and vomiting 08/19/2014   Onychomycosis of left great toe 08/21/2014   Pain of fifth toe 12/11/2018   Paroxysmal atrial fibrillation ([place]) 07/25/2021   Peptic esophagitis    Peripheral neuropathy    Peripheral neuropathy due to chemotherapy ([place])  09/21/2014   Prolonged Q-T interval on ECG 08/19/2014   Prostate cancer ([place]) 09/28/2019   Reactive depression 10/12/2019   Renal insufficiency 08/17/2014   Right bundle branch block 09/28/2019   Sepsis ([place]) 05/18/2020   Small fiber neuropathy 12/16/2018   Squamous cell carcinoma of left tonsil (HCC)    Stage 3b chronic kidney  disease ([place]) 12/20/2021   Throat cancer ([place])    Thyroid disease    Transaminitis 08/17/2014   Unstable angina ([place]) 09/14/2014   Whiplash injury syndrome, sequela 08/11/2018    Past Surgical History:  Procedure Laterality Date   CARDIAC ELECTROPHYSIOLOGY STUDY AND ABLATION     COLONOSCOPY  04/07/2007   Small colonic polyp status post polypectomy. Pancolonic diverticulosis predominantly in the sigmoid colon. Internal hemorrhoids.    ESOPHAGOGASTRODUODENOSCOPY  06/15/2010   Erosive esophagitis. Status post PEG placment   EYE SURGERY     INGUINAL HERNIA REPAIR     PROSTATE BIOPSY     SEBACEOUS CYST REMOVAL     TONSILLECTOMY      Prior to Admission medications   Medication Sig Start Date End Date Taking? Authorizing Provider  albuterol (PROVENTIL) (2.5 MG/3ML) 0.083% nebulizer solution Take 2.5 mg by nebulization every 6 (six) hours as needed for wheezing or shortness of breath. 04/09/22  Yes [provider]  Cholecalciferol (VITAMIN D3) 125 MCG (5000 UT) TABS Take 5,000 Units by mouth daily.   Yes [provider]  Docusate Sodium (STOOL SOFTENER LAXATIVE PO) Take 1 tablet by mouth as needed for constipation.   Yes [provider]  famotidine (PEPCID) 40 MG tablet Take 40 mg by mouth at bedtime.   Yes [provider]  furosemide (LASIX) 20 MG tablet Take 20 mg by mouth daily as needed for edema or fluid. 04/17/19  Yes [provider]  lamoTRIgine (LAMICTAL) 100 MG tablet Take 100 mg by mouth at bedtime. 03/23/22  Yes [provider]  levothyroxine (SYNTHROID) 125 MCG tablet Take 125 mcg by mouth daily. 11/19/19  Yes [provider]  LORazepam (ATIVAN) 1 MG tablet Take 1 mg by mouth 2 (two) times daily. 05/15/22  Yes [provider]  metoprolol tartrate (LOPRESSOR) 50 MG tablet Take 1 tablet (50 mg total) by mouth 2 (two) times daily. 01/22/22  Yes Revankar, Reita Cliche, MD  Omega-3 Fatty Acids (FISH OIL) 1000 MG CPDR Take 2,000  mg by mouth 2 (two) times daily.   Yes [provider]  omeprazole (PRILOSEC) 40 MG capsule Take 40 mg by mouth 2 (two) times daily. 01/21/22  Yes [provider]  polyethylene glycol (MIRALAX / GLYCOLAX) 17 g packet Take 17 g by mouth daily as needed for constipation.   Yes [provider]  QUEtiapine (SEROQUEL) 400 MG tablet Take 200 mg by mouth every evening. 03/12/20  Yes [provider]  rosuvastatin (CRESTOR) 10 MG tablet Take 10 mg by mouth every evening.   Yes [provider]  warfarin (COUMADIN) 2.5 MG tablet TAKE 2 1/2 TABLETS (6.'25MG'$ ) DAILY AS DIRECTED BY COUMADIN CLINIC. 05/01/22  Yes Revankar, Reita Cliche, MD    Current Facility-Administered Medications  Medication Dose Route Frequency Provider Last Rate Last Admin   acetaminophen (TYLENOL) tablet 650 mg  650 mg Oral Q6H PRN Atway, Rayann N, DO       Or   acetaminophen (TYLENOL) suppository 650 mg  650 mg Rectal Q6H PRN Atway, Rayann N, DO       amiodarone (NEXTERONE PREMIX) 360-4.14 MG/200ML-% (1.8 mg/mL) IV  infusion  60 mg/hr Intravenous Continuous Lelon Perla, MD 33.3 mL/hr at 07/06/22 1029 60 mg/hr at 07/06/22 1029   Followed by   amiodarone (NEXTERONE PREMIX) 360-4.14 MG/200ML-% (1.8 mg/mL) IV infusion  30 mg/hr Intravenous Continuous Lelon Perla, MD       Ampicillin-Sulbactam (UNASYN) 3 g in sodium chloride 0.9 % 100 mL IVPB  3 g Intravenous Q6H Jodell Cipro, Sriramkumar, MD 200 mL/hr at 07/06/22 1225 3 g at 07/06/22 1225   aspirin chewable tablet 81 mg  81 mg Oral Daily Lelon Perla, MD       lactated ringers infusion   Intravenous Continuous Linus Galas, MD 75 mL/hr at 07/06/22 1141 New Bag at 07/06/22 1141   lamoTRIgine (LAMICTAL) tablet 100 mg  100 mg Oral QHS Linus Galas, MD   100 mg at 07/05/22 2210   levothyroxine (SYNTHROID) tablet 125 mcg  125 mcg Oral Daily Linus Galas, MD   125 mcg at 07/05/22 2211   LORazepam (ATIVAN) tablet 1 mg   1 mg Oral BID Linus Galas, MD   1 mg at 07/05/22 2237   metoprolol tartrate (LOPRESSOR) tablet 50 mg  50 mg Oral BID Linus Galas, MD   50 mg at 07/05/22 2134   Oral care mouth rinse  15 mL Mouth Rinse PRN Aldine Contes, MD       pantoprazole (PROTONIX) injection 40 mg  40 mg Intravenous Q12H Linus Galas, MD   40 mg at 07/06/22 0751   polyethylene glycol (MIRALAX / GLYCOLAX) packet 17 g  17 g Oral Daily PRN Linus Galas, MD       QUEtiapine (SEROQUEL) tablet 200 mg  200 mg Oral QPM Jodell Cipro, Sriramkumar, MD   200 mg at 07/05/22 2238   rosuvastatin (CRESTOR) tablet 10 mg  10 mg Oral QPM Linus Galas, MD   10 mg at 07/05/22 2203   senna-docusate (Senokot-S) tablet 1 tablet  1 tablet Oral PRN Linus Galas, MD        Allergies as of 07/05/2022 - Review Complete 07/05/2022  Allergen Reaction Noted   Trileptal [oxcarbazepine] Rash 12/17/2018    Family History  Problem Relation Age of Onset   Other Mother        brain tumor - unsure if it was cancer   Heart disease Father    Prostate cancer Father    Stomach cancer Father    Colon cancer Neg Hx    Esophageal cancer Neg Hx    Rectal cancer Neg Hx     Social History   Socioeconomic History   Marital status: Married    Spouse name: Not on file   Number of children: 3   Years of education: 10th grade   Highest education level: Not on file  Occupational History   Occupation: Retired  Tobacco Use   Smoking status: Former    Types: Cigarettes    Quit date: 2011    Years since quitting: 13.0   Smokeless tobacco: Former  Scientific laboratory technician Use: Never used  Substance and Sexual Activity   Alcohol use: Not Currently   Drug use: Never   Sexual activity: Not on file  Other Topics Concern   Not on file  Social History Narrative   Lives at home with wife.   Right-handed.   2 cups of caffeine per day.   Social Determinants of Health   Financial Resource Strain: Not  on file  Food Insecurity: No Food Insecurity (07/06/2022)   Hunger Vital Sign  Worried About Charity fundraiser in the Last Year: Never true    [place] in the Last Year: Never true  Transportation Needs: No Transportation Needs (07/06/2022)   PRAPARE - Hydrologist (Medical): No    Lack of Transportation (Non-Medical): No  Physical Activity: Not on file  Stress: Not on file  Social Connections: Not on file  Intimate Partner Violence: Not At Risk (07/06/2022)   Humiliation, Afraid, Rape, and Kick questionnaire    Fear of Current or Ex-Partner: No    Emotionally Abused: No    Physically Abused: No    Sexually Abused: No   Review of Systems: ROS is O/W negative except as mentioned in HPI.  Physical Exam: Vital signs in last 24 hours: Temp:  [98.2 F (36.8 C)-99.1 F (37.3 C)] 98.2 F (36.8 C) (01/26 1215) Pulse Rate:  [76-150] 100 (01/26 0745) Resp:  [15-26] 15 (01/26 1215) BP: (92-172)/(52-89) 92/65 (01/26 1215) SpO2:  [85 %-100 %] 92 % (01/26 1215) Last BM Date : 07/04/22 General:  Alert, Well-developed, well-nourished, fairly pleasant and cooperative in NAD Head:  Normocephalic and atraumatic. Eyes:  Sclera clear, no icterus.  Conjunctiva pink. Ears:  Normal auditory acuity. Mouth:  No deformity or lesions.   Lungs:  Clear throughout to auscultation.  No wheezes, crackles, or rhonchi.  Heart:  Irregularly irregular.  In atrial fibrillation with RVR. Abdomen:  Soft, non-distended.  BS present.  Non-tender.   Msk:  Symmetrical without gross deformities.  Pulses:  Normal pulses noted. Extremities:  Without clubbing or edema. Neurologic:  Alert but confused; grossly normal neurologically. Skin:  Intact without significant lesions or rashes.  Intake/Output from previous day: 01/25 0701 - 01/26 0700 In: 2102.4 [I.V.:552.4; IV Piggyback:1550] Out: 400 [Urine:400]  Lab Results: Recent Labs    07/05/22 1322 07/05/22 1334  07/05/22 1335 07/06/22 0316  WBC 9.1  --   --  9.9  HGB 13.5 14.3 13.9 11.2*  HCT 42.4 42.0 41.0 35.4*  PLT 151  --   --  138*   BMET Recent Labs    07/05/22 1322 07/05/22 1334 07/05/22 1335 07/06/22 0316  NA 140 139 138 139  K 4.6 4.7 4.7 4.1  CL 107 105  --  108  CO2 24  --   --  25  GLUCOSE 125* 115*  --  107*  BUN 29* 34*  --  28*  CREATININE 1.95* 2.00*  --  1.62*  CALCIUM 8.9  --   --  8.3*   LFT Recent Labs    07/05/22 1322  PROT 6.0*  ALBUMIN 3.4*  AST 31  ALT 17  ALKPHOS 47  BILITOT 0.6   PT/INR Recent Labs    07/05/22 1322  LABPROT 22.3*  INR 2.0*   Studies/Results: ECHOCARDIOGRAM COMPLETE  Result Date: 07/06/2022    ECHOCARDIOGRAM REPORT   Patient Name:   Trevonn Hallum [name] Date of Exam: 07/06/2022 Medical Rec #:  [id number]              Height:       67.0 in Accession #:    [id number]             Weight:       209.4 lb Date of Birth:  Dec 31, 1940               BSA:          2.062 m Patient Age:    50 years  BP:           83/56 mmHg Patient Gender: M                      HR:           102 bpm. Exam Location:  Inpatient Procedure: 2D Echo, Color Doppler and Cardiac Doppler Indications:    elevated troponin  History:        Patient has prior history of Echocardiogram examinations.                 Previous Myocardial Infarction and CAD, Arrythmias:Atrial                 Fibrillation and RBBB; Risk Factors:Hypertension.  Sonographer:    Phineas Douglas Referring Phys: [id number] [place]  1. Left ventricular ejection fraction, by estimation, is 55 to 60%. The left ventricle has normal function. The left ventricle has no regional wall motion abnormalities. There is moderate left ventricular hypertrophy. Left ventricular diastolic parameters are indeterminate.  2. Right ventricular systolic function is normal. The right ventricular size is normal.  3. Left atrial size was moderately dilated.  4. The mitral valve is abnormal. Mild mitral valve  regurgitation. No evidence of mitral stenosis.  5. Fused right and left cusp suspect there is at least mild stenosis Consider having patient return for dedicated CW doppler interrogation of AV. The aortic valve is normal in structure. There is moderate calcification of the aortic valve. There is moderate thickening of the aortic valve. Aortic valve regurgitation is mild. Aortic valve sclerosis/calcification is present, without any evidence of aortic stenosis.  6. The inferior vena cava is normal in size with greater than 50% respiratory variability, suggesting right atrial pressure of 3 mmHg. FINDINGS  Left Ventricle: Left ventricular ejection fraction, by estimation, is 55 to 60%. The left ventricle has normal function. The left ventricle has no regional wall motion abnormalities. The left ventricular internal cavity size was normal in size. There is  moderate left ventricular hypertrophy. Left ventricular diastolic parameters are indeterminate. Right Ventricle: The right ventricular size is normal. No increase in right ventricular wall thickness. Right ventricular systolic function is normal. Left Atrium: Left atrial size was moderately dilated. Right Atrium: Right atrial size was normal in size. Pericardium: There is no evidence of pericardial effusion. Mitral Valve: The mitral valve is abnormal. There is mild thickening of the mitral valve leaflet(s). There is mild calcification of the mitral valve leaflet(s). Mild mitral annular calcification. Mild mitral valve regurgitation. No evidence of mitral valve stenosis. Tricuspid Valve: The tricuspid valve is normal in structure. Tricuspid valve regurgitation is mild . No evidence of tricuspid stenosis. Aortic Valve: Fused right and left cusp suspect there is at least mild stenosis Consider having patient return for dedicated CW doppler interrogation of AV. The aortic valve is normal in structure. There is moderate calcification of the aortic valve. There is moderate  thickening of the aortic valve. Aortic valve regurgitation is mild. Aortic regurgitation PHT measures 443 msec. Aortic valve sclerosis/calcification is present, without any evidence of aortic stenosis. Pulmonic Valve: The pulmonic valve was normal in structure. Pulmonic valve regurgitation is not visualized. No evidence of pulmonic stenosis. Aorta: The aortic root is normal in size and structure. Venous: The inferior vena cava is normal in size with greater than 50% respiratory variability, suggesting right atrial pressure of 3 mmHg. IAS/Shunts: No atrial level shunt detected by color flow Doppler.  LEFT VENTRICLE PLAX 2D  LVIDd:         4.60 cm      Diastology LVIDs:         3.10 cm      LV e' medial:    9.14 cm/s LV PW:         1.30 cm      LV E/e' medial:  7.3 LV IVS:        1.50 cm      LV e' lateral:   8.70 cm/s LVOT diam:     2.10 cm      LV E/e' lateral: 7.6 LV SV:         43 LV SV Index:   21 LVOT Area:     3.46 cm  LV Volumes (MOD) LV vol d, MOD A2C: 124.0 ml LV vol d, MOD A4C: 101.0 ml LV vol s, MOD A2C: 53.7 ml LV vol s, MOD A4C: 42.3 ml LV SV MOD A2C:     70.3 ml LV SV MOD A4C:     101.0 ml LV SV MOD BP:      71.4 ml RIGHT VENTRICLE             IVC RV Basal diam:  3.80 cm     IVC diam: 2.60 cm RV S prime:     10.10 cm/s TAPSE (M-mode): 1.5 cm LEFT ATRIUM             Index        RIGHT ATRIUM           Index LA diam:        4.30 cm 2.09 cm/m   RA Area:     20.90 cm LA Vol (A2C):   96.7 ml 46.89 ml/m  RA Volume:   61.00 ml  29.58 ml/m LA Vol (A4C):   99.2 ml 48.10 ml/m LA Biplane Vol: 97.7 ml 47.38 ml/m  AORTIC VALVE LVOT Vmax:   78.30 cm/s LVOT Vmean:  48.700 cm/s LVOT VTI:    0.125 m AI PHT:      443 msec  AORTA Ao Root diam: 3.40 cm Ao Asc diam:  3.20 cm MITRAL VALVE MV Area (PHT): 5.16 cm    SHUNTS MV Decel Time: 147 msec    Systemic VTI:  0.12 m MV E velocity: 66.40 cm/s  Systemic Diam: 2.10 cm Jenkins Rouge MD Electronically signed by Jenkins Rouge MD Signature Date/Time: 07/06/2022/10:06:05 AM     Final    MR BRAIN WO CONTRAST  Result Date: 07/05/2022 CLINICAL DATA:  Neuro deficit, acute, stroke suspected Mental status change, unknown cause EXAM: MRI HEAD WITHOUT CONTRAST TECHNIQUE: Multiplanar, multiecho pulse sequences of the brain and surrounding structures were obtained without intravenous contrast. COMPARISON:  Same day CT head. FINDINGS: Brain: No acute infarction, hemorrhage, hydrocephalus, extra-axial collection or mass lesion. Chronic microvascular ischemic disease. Vascular: Major arterial flow voids are maintained at the skull base. Skull and upper cervical spine: Normal marrow signal. Sinuses/Orbits: Negative. Other: No sizable mastoid effusions. IMPRESSION: No evidence of acute intracranial abnormality. Electronically Signed   By: Margaretha Sheffield M.D.   On: 07/05/2022 18:58   CT Head Wo Contrast  Result Date: 07/05/2022 CLINICAL DATA:  Head trauma, minor (Age >= 65y) EXAM: CT HEAD WITHOUT CONTRAST TECHNIQUE: Contiguous axial images were obtained from the base of the skull through the vertex without intravenous contrast. RADIATION DOSE REDUCTION: This exam was performed according to the departmental dose-optimization program which includes automated exposure control, adjustment of the mA and/or  kV according to patient size and/or use of iterative reconstruction technique. COMPARISON:  May 04, 2022. FINDINGS: Brain: No evidence of acute infarction, hemorrhage, hydrocephalus, extra-axial collection or mass lesion/mass effect. Patchy white matter hypodensities nonspecific but compatible with chronic microvascular ischemic disease. Vascular: No hyperdense vessel. Skull: No acute fracture. Sinuses/Orbits: Anterior right ethmoid air cell osteoma. No acute orbital findings. Other: No mastoid effusions IMPRESSION: No evidence of acute intracranial abnormality. Electronically Signed   By: Margaretha Sheffield M.D.   On: 07/05/2022 14:33   CT Cervical Spine Wo Contrast  Result Date:  07/05/2022 CLINICAL DATA:  Neck trauma EXAM: CT CERVICAL SPINE WITHOUT CONTRAST TECHNIQUE: Multidetector CT imaging of the cervical spine was performed without intravenous contrast. Multiplanar CT image reconstructions were also generated. RADIATION DOSE REDUCTION: This exam was performed according to the departmental dose-optimization program which includes automated exposure control, adjustment of the mA and/or kV according to patient size and/or use of iterative reconstruction technique. COMPARISON:  None Available. FINDINGS: Alignment: Facet joints are aligned without dislocation or traumatic listhesis. Dens and lateral masses are aligned. Degenerative facet mediated grade 1 anterolisthesis of C4 on C5. Skull base and vertebrae: No acute fracture. No primary bone lesion or focal pathologic process. Soft tissues and spinal canal: No prevertebral fluid or swelling. No visible canal hematoma. Disc levels: Degenerative disc disease of the C4-5 through C6-7 levels. Multilevel facet arthropathy. The left C4-5 facet joint is fused. Upper chest: Negative. Other: Bilateral carotid atherosclerosis. Small calcified left anterior chain cervical lymph nodes. IMPRESSION: 1. No acute fracture or traumatic listhesis of the cervical spine. 2. Multilevel degenerative disc disease and facet arthropathy. Electronically Signed   By: Davina Poke D.O.   On: 07/05/2022 14:04   DG Chest Port 1 View  Result Date: 07/05/2022 CLINICAL DATA:  Vomiting EXAM: PORTABLE CHEST 1 VIEW COMPARISON:  CXR 05/04/22 FINDINGS: No pleural effusion. No pneumothorax. There are patchy opacities in the right mid and lower lung, as well as the left lung base. These are worrisome for multifocal infection, possibly related to aspiration. No displaced rib fractures. Visualized upper abdomen is notable for gaseous distention of the colon at the splenic flexure. IMPRESSION: Patchy opacities in the right mid and lower lung, as well as the left lung base.  These are worrisome for multifocal infection, possibly related to aspiration. Electronically Signed   By: Marin Roberts M.D.   On: 07/05/2022 13:33    IMPRESSION:  *Coffee-ground emesis: Hemoglobin 11.2 g, which is down a couple grams from when he first came in, but that could have also been hemoconcentrated and likely is dilutional component as well. *Elevated troponin over 3000 *Atrial fibrillation on Warfarin with INR 2.0 on 1/25 *Dementia:  No family present at the time of my visit.  Patient is adamant that he did not have any dark colored emesis. *History of oropharyngeal squamous cell carcinoma status post radiation and chemotherapy, suspicion of aspiration PNA.  Getting SLP involved.  PLAN: -Continue pantoprazole 40 mg IV twice daily. -Monitor hemoglobin other labs. -Agree with palliative care consult for GOC. -No plans for EGD for now.  Laban Emperor. Zehr  07/06/2022, 12:38 PM   Attending physician's note  I have taken a history, reviewed the chart and examined the patient. I performed a substantive portion of this encounter, including complete performance of at least one of the key components, in conjunction with the APP. I agree with the APP's note, impression and recommendations.    82 year old gentleman with history of dementia, hypertension,  A-fib on warfarin, throat cancer s/p chemoradiation admitted with altered mental status and weakness, had dark vomit  No further vomiting since admission Hemoglobin has trended down slightly 13-->11 with IV fluids  No evidence of ongoing GI bleed  INR 2.0 on Coumadin  Elevated troponin ~2950, cardiology following patient  Palliative care consult  No plan for endoscopic evaluation unless patient has signs of GI hemorrhage needing therapeutic intervention Continue conservative management Pantoprazole 40 mg twice daily Diet as tolerated   The patient and his wife at bedside were provided an opportunity to ask questions and all were  answered. They agreed with the plan and demonstrated an understanding of the instructions.  Damaris Hippo , MD [phone]

## 2022-07-06 NOTE — Progress Notes (Signed)
Echocardiogram 2D Echocardiogram has been performed.  [name] 07/06/2022, 9:48 AM

## 2022-07-06 NOTE — Evaluation (Signed)
Physical Therapy Evaluation Patient Details Name: [name] MRN: [id number] DOB: 10/18/1940 Today's Date: 07/06/2022  History of Present Illness  Pt is a 82 y/o male presenting on 1/25 after being found down by family.  Found with acute metabolic encephalopathy, possible aspiration PNA with chest xray showing patchy opacities in R mid and lower lung, L lung base. PMH includes: dementia, HTN, afib on warfarin, bipolar disorder, cataracts, CAD, MI, throat cancer s/p chemo and radiation.  Clinical Impression  Pt alone in room, and pt poor historian but reports he does his bathing and dressing and ambulates without AD. Reports fall with bilateral broken arms last year and he knows he is not supposed to be driving. Pt limited in safe mobility by A-fib with HR in 140s and associated brief period of decreased verbalization and balance. In addition, pt has poor safety awareness and decreased knowledge of RW use in presence of decreased balance and command follow. Pt is min guard for bed mobility, minAx2 for transfers and up to modAx2 for ambulation PT recommending return home with HHPT if family is able to provide 24 hour assistance, otherwise pt would benefit from a Memory Care facility. PT will continue to follow acutely      Recommendations for follow up therapy are one component of a multi-disciplinary discharge planning process, led by the attending physician.  Recommendations may be updated based on patient status, additional functional criteria and insurance authorization.  Follow Up Recommendations Home health PT (pt will likely do best in his home environment if 24 hour assistance not available, pt would benefit from [place])      Assistance Recommended at Discharge Frequent or constant Supervision/Assistance  Patient can return home with the following  A lot of help with walking and/or transfers;A lot of help with bathing/dressing/bathroom;Assistance with cooking/housework;Direct  supervision/assist for medications management;Direct supervision/assist for financial management;Assist for transportation;Help with stairs or ramp for entrance    Equipment Recommendations None recommended by PT     Functional Status Assessment Patient has had a recent decline in their functional status and demonstrates the ability to make significant improvements in function in a reasonable and predictable amount of time.     Precautions / Restrictions Precautions Precautions: Fall Precaution Comments: reports hx of fall where he broke both arms last year      Mobility  Bed Mobility Overal bed mobility: Needs Assistance Bed Mobility: Supine to Sit     Supine to sit: Min guard     General bed mobility comments: increased time and effort for coming to [facility], asked what he should pull up on and then refused to use bedrail when he was shown    Transfers Overall transfer level: Needs assistance Equipment used: Rolling walker (2 wheels) Transfers: Sit to/from Stand Sit to Stand: Min assist, +2 physical assistance           General transfer comment: min Ax2 for management of lines, and proper hand placement, good power up, assist for steadying    Ambulation/Gait Ambulation/Gait assistance: Min assist, Mod assist, +2 physical assistance, +2 safety/equipment Gait Distance (Feet): 25 Feet Assistive device: Rolling walker (2 wheels) Gait Pattern/deviations: Step-through pattern, Decreased step length - right, Decreased step length - left, Trunk flexed, Narrow base of support Gait velocity: inappropriate Gait velocity interpretation: <1.8 ft/sec, indicate of risk for recurrent falls   General Gait Details: pt with poor knowledge of RW use making him unsafe, however lacks balance to be able to ambulate without it. Pt  with little to no command follow and becomes irritated when pt unable to leave room and PT/OT manage RW to turn to go back to recliner. Pt with increase in HR to 140s  coinciding with decreased verbalization, diaphoresis and decreased balance, attempted to have pt sit and rest, pt refuses and then starts to ambulate much to fast for conditions        Balance Overall balance assessment: Needs assistance Sitting-balance support: Feet supported, Bilateral upper extremity supported, Single extremity supported Sitting balance-Leahy Scale: Poor Sitting balance - Comments: pt with advancing posterior lean, when given outside support and pt asked if he notices he is leaning back pt states "it's bedcause you are pushing me" Postural control: Posterior lean Standing balance support: Bilateral upper extremity supported, Reliant on assistive device for balance Standing balance-Leahy Scale: Poor Standing balance comment: static standing with RW, requires support for posterior lean requiring support during standing rest break                             Pertinent Vitals/Pain Pain Assessment Pain Assessment: No/denies pain    Home Living Family/patient expects to be discharged to:: Private residence Living Arrangements: Spouse/significant other Available Help at Discharge: Family Type of Home: House Home Access: Stairs to enter   Technical brewer of Steps: 1   Home Layout: One level Home Equipment: Grab bars - tub/shower;Hand held shower head;Shower Land (2 wheels);Rollator (4 wheels)      Prior Function Prior Level of Function : Patient poor historian/Family not available (hx of dementia)             Mobility Comments: independent mobility ADLs Comments: reports independent        Extremity/Trunk Assessment        Lower Extremity Assessment Lower Extremity Assessment: Difficult to assess due to impaired cognition;Generalized weakness (brusing on R thigh)     Upper Extremity Assessmetn: Defer to OT  Communication      Cognition Arousal/Alertness: Awake/alert Behavior During Therapy: Impulsive Overall  Cognitive Status: History of cognitive impairments - at baseline                                 General Comments: pt with very poor safety awareness, and no knowledge of DME use, impulsive when given instructions on turning at door due to increased HR, and with cues for RW use        General Comments General comments (skin integrity, edema, etc.): Pt with unsustained HR in 140s with ambulation unsustained Pt on 3L O2 via [place] on entry, SpO2 98%O2, removed and at rest able to maintain SpO2 in low 90s, with ambulation SpO2 dropped to mid 80s SpO2 with poor waveform. In sitting after ambulation SpO2 in high 80s, replaced supplmental O2.        Assessment/Plan    PT Assessment Patient needs continued PT services  PT Problem List Decreased activity tolerance;Decreased balance;Decreased mobility;Decreased coordination;Decreased cognition;Decreased knowledge of use of DME;Decreased safety awareness;Cardiopulmonary status limiting activity       PT Treatment Interventions DME instruction;Gait training;Functional mobility training;Therapeutic activities;Therapeutic exercise;Balance training;Cognitive remediation;Patient/family education    PT Goals (Current goals can be found in the Care Plan section)  Acute Rehab PT Goals Patient Stated Goal: none stated PT Goal Formulation: Patient unable to participate in goal setting Time For Goal Achievement: 07/20/22 Potential to Achieve Goals: Fair  Frequency Min 3X/week     Co-evaluation PT/OT/SLP Co-Evaluation/Treatment: Yes Reason for Co-Treatment: Necessary to address cognition/behavior during functional activity;For patient/therapist safety PT goals addressed during session: Mobility/safety with mobility         AM-PAC PT "6 Clicks" Mobility  Outcome Measure Help needed turning from your back to your side while in a flat bed without using bedrails?: None Help needed moving from lying on your back to sitting on the side of  a flat bed without using bedrails?: None Help needed moving to and from a bed to a chair (including a wheelchair)?: Total Help needed standing up from a chair using your arms (e.g., wheelchair or bedside chair)?: Total Help needed to walk in hospital room?: Total Help needed climbing 3-5 steps with a railing? : Total 6 Click Score: 12    End of Session Equipment Utilized During Treatment: Gait belt;Oxygen Activity Tolerance: Treatment limited secondary to agitation;Treatment limited secondary to medical complications (Comment) (increased HR in 140s) Patient left: in chair;with call bell/phone within reach;with chair alarm set Nurse Communication: Mobility status;Other (comment) (increased HR) PT Visit Diagnosis: Unsteadiness on feet (R26.81);Other abnormalities of gait and mobility (R26.89);Muscle weakness (generalized) (M62.81);History of falling (Z91.81);Difficulty in walking, not elsewhere classified (R26.2)    Time: 5449-2010 PT Time Calculation (min) (ACUTE ONLY): 29 min   Charges:   PT Evaluation $PT Eval Moderate Complexity: 1 Mod          Jamus Loving B. Migdalia Dk PT, DPT Acute Rehabilitation Services Please use secure chat or  Call Office [phone]   [place] 07/06/2022, 12:17 PM

## 2022-07-06 NOTE — TOC Initial Note (Signed)
Transition of Care [facility]) - Initial/Assessment Note    Patient Details  Name: [name] MRN: [id number] Date of Birth: 1941-06-04  Transition [facility]) CM/SW Contact:    Bethena Roys, RN Phone Number: 07/06/2022, 1:10 PM  Clinical Narrative: Patient presented for altered memory status. PTA patient was from home with spouse. Case Manager received a message from PT that patient will need 24/hr supervision in the home if this is the plan. Case Manager called spouse and she is the patients support system. Spouse wants the patient to return home and she will discuss with family and friends regarding additional support in the home. Case Manager discussed Medicare.gov list with spouse and she wants to use [facility] for RN, PT, OT, Aide, Services- Office to call the patient with visit times. Case Manager will continue to follow for transition of care needs as the patient progresses.                   Expected Discharge Plan: [place] Barriers to Discharge: Continued Medical Work up   Patient Goals and CMS Choice Patient states their goals for this hospitalization and ongoing recovery are:: spouse wants patient to come home. CMS Medicare.gov Compare Post Acute Care list provided to:: Patient Represenative (must comment) (Spouse.) Choice offered to / list presented to : Spouse      Expected Discharge Plan and Services   Discharge Planning Services: CM Consult Post Acute Care Choice: Home Health Living arrangements for the past 2 months: Single Family Home    HH Arranged: RN, Disease Management, PT, OT, Nurse's Aide HH Agency: [facility] Date [facility] Agency Contacted: 07/06/22 Time HH Agency Contacted: 35 Representative spoke with at [place]: [place] Arrangements/Services Living arrangements for the past 2 months: [place] with:: Spouse Patient language and need for interpreter reviewed::  Yes Do you feel safe going back to the place where you live?: Yes      Need for Family Participation in Patient Care: Yes (Comment) Care giver support system in place?: Yes (comment)   Criminal Activity/Legal Involvement Pertinent to Current Situation/Hospitalization: No - Comment as needed  Activities of Daily Living Home Assistive Devices/Equipment: Walker (specify type) ADL Screening (condition at time of admission) Patient's cognitive ability adequate to safely complete daily activities?: Yes Is the patient deaf or have difficulty hearing?: No Does the patient have difficulty seeing, even when wearing glasses/contacts?: No Does the patient have difficulty concentrating, remembering, or making decisions?: No Patient able to express need for assistance with ADLs?: Yes Does the patient have difficulty dressing or bathing?: No Independently performs ADLs?: Yes (appropriate for developmental age) Does the patient have difficulty walking or climbing stairs?: No Weakness of Legs: None Weakness of Arms/Hands: None  Permission Sought/Granted Permission sought to share information with : Family Supports, Customer service manager, Case Optician, dispensing granted to share information with : Yes, Verbal Permission Granted     Permission granted to share info w AGENCY: [facility]        Emotional Assessment Appearance:: Appears stated age       Alcohol / Substance Use: Not Applicable Psych Involvement: No (comment)  Admission diagnosis:  Confusion [R41.0] Aspiration pneumonia ([place]) [J69.0] NSTEMI (non-ST elevated myocardial infarction) ([place]) [I21.4] Aspiration pneumonia of both lower lobes due to vomit [facility]) [J69.0] Patient Active Problem List   Diagnosis Date Noted   Aspiration pneumonia ([place]) 07/05/2022  Metabolic encephalopathy 42/70/6237   Stage 3b chronic kidney disease ([place]) 12/20/2021   Long term (current) use of anticoagulants 11/14/2021   Paroxysmal  atrial fibrillation ([place]) 07/25/2021   Coronary artery calcification 07/25/2021   Anxiety    Cataract    Chronic depression    Degenerative lumbar disc    Hypercholesteremia    Hypertension    Malignant neoplasm of prostate (HCC)    Peptic esophagitis    Peripheral neuropathy    Squamous cell carcinoma of left tonsil (HCC)    Thyroid disease    Sepsis ([place]) 05/18/2020   Mild aortic stenosis 11/26/2019   Reactive depression 10/12/2019   Mood change 10/12/2019   Atrial fibrillation ([place]) 09/28/2019   Bipolar disorder ([place]) 09/28/2019   Cancer ([place]) 09/28/2019   Prostate cancer ([place]) 09/28/2019   Right bundle branch block 09/28/2019   Throat cancer ([place]) 09/28/2019   Essential hypertension 09/28/2019   Cardiac murmur 09/28/2019   Chronic pain due to trauma 07/16/2019   Cervical radicular pain 07/16/2019   Dementia without behavioral disturbance ([place]) 07/16/2019   Cervical spondylosis 06/19/2019   Small fiber neuropathy 12/16/2018   [place] of toe 12/11/2018   Pain of fifth toe 12/11/2018   Degenerative disc disease, cervical 09/29/2018   Whiplash injury syndrome, sequela 08/11/2018   Essential tremor 09/21/2014   Peripheral neuropathy due to chemotherapy ([place]) 09/21/2014   AKI (acute kidney injury) ([place]) 09/20/2014   Bradycardia 09/17/2014   History of MI (myocardial infarction) 09/14/2014   Hypotension 09/14/2014   Unstable angina (HCC) 09/14/2014   Onychomycosis of left great toe 08/21/2014   Chest pain with high risk for cardiac etiology 08/19/2014   Nausea and vomiting 08/19/2014   Prolonged Q-T interval on ECG 08/19/2014   Acquired hypothyroidism 08/17/2014   Renal insufficiency 08/17/2014   Transaminitis 08/17/2014   Acute MI, true posterior wall, subsequent episode of care ([place]) 08/15/2014   Disease characterized by destruction of skeletal muscle 08/15/2014   Myocardial infarction ([place]) 08/10/2014   Coronary artery disease 08/10/2014   PCP:  [name],  MD Pharmacy:   [id number] [place] [id number]: [phone] Fax: [phone]  Social Determinants of Health (SDOH) Social History: SDOH Screenings   Food Insecurity: No Food Insecurity (07/06/2022)  Housing: Low Risk  (07/06/2022)  Transportation Needs: No Transportation Needs (07/06/2022)  Utilities: Not At Risk (07/06/2022)  Depression (PHQ2-9): Low Risk  (10/31/2021)  Tobacco Use: Medium Risk (07/05/2022)   Readmission Risk Interventions     No data to display

## 2022-07-06 NOTE — Progress Notes (Addendum)
SLP Cancellation Note  Patient Details Name: [name] MRN: [id number] DOB: [birth year]   Cancelled treatment:       Reason Eval/Treat Not Completed: Patient at procedure or test/unavailable. Attempted x2 this am. Pt with multiple providers consecutively. Called wife x2 for history no response. Will f/u when able   Peni Rupard, Katherene Ponto 07/06/2022, 10:25 AM

## 2022-07-06 NOTE — Evaluation (Signed)
Clinical/Bedside Swallow Evaluation Patient Details  Name: [name] MRN: [id number] Date of Birth: 1940/09/10  Today's Date: 07/06/2022 Time: SLP Start Time (ACUTE ONLY): 1330 SLP Stop Time (ACUTE ONLY): 1350 SLP Time Calculation (min) (ACUTE ONLY): 20 min  Past Medical History:  Past Medical History:  Diagnosis Date   Acquired hypothyroidism 08/17/2014   Acute MI, true posterior wall, subsequent episode of care ([place]) 08/15/2014   EF 44% with mildly reduced LV function  Formatting of this note might be different from the original. EF 44% with mildly reduced LV function   AKI (acute kidney injury) ([place]) 09/20/2014   Anxiety    Atrial fibrillation ([place]) 09/28/2019   ablation in past  Formatting of this note might be different from the original. ablation in past   Bipolar disorder [facility])    w/dementia/psychotic episodes   Bradycardia 09/17/2014   Cancer ([place]) 09/28/2019   Prostate Cancer; Throat Cancer  Formatting of this note might be different from the original. Prostate Cancer; Throat Cancer   Cardiac murmur 09/28/2019   Cataract    Cervical radicular pain 07/16/2019   Cervical spondylosis 06/19/2019   Chest pain with high risk for cardiac etiology 08/19/2014   Chronic depression    Chronic pain due to trauma 07/16/2019   Corn of toe 12/11/2018   Coronary artery calcification 07/25/2021   Coronary artery disease 08/10/2014   DDD (degenerative disc disease), cervical 09/29/2018   Degenerative lumbar disc    Dementia without behavioral disturbance ([place]) 07/16/2019   Disease characterized by destruction of skeletal muscle 08/15/2014   Essential hypertension 09/28/2019   Essential tremor 09/21/2014   History of MI (myocardial infarction) 09/14/2014   Hypercholesteremia    Hypertension    Hypotension 09/14/2014   Long term (current) use of anticoagulants 11/14/2021   Malignant neoplasm of prostate ([place])    Mild aortic stenosis 11/26/2019   Mood change 10/12/2019   Myocardial infarction ([place]) 08/10/2014    Nausea and vomiting 08/19/2014   Onychomycosis of left great toe 08/21/2014   Pain of fifth toe 12/11/2018   Paroxysmal atrial fibrillation ([place]) 07/25/2021   Peptic esophagitis    Peripheral neuropathy    Peripheral neuropathy due to chemotherapy ([place]) 09/21/2014   Prolonged Q-T interval on ECG 08/19/2014   Prostate cancer ([place]) 09/28/2019   Reactive depression 10/12/2019   Renal insufficiency 08/17/2014   Right bundle branch block 09/28/2019   Sepsis ([place]) 05/18/2020   Small fiber neuropathy 12/16/2018   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 Pancolonic diverticulosis predominantly in the sigmoid colon. Internal hemorrhoids.    ESOPHAGOGASTRODUODENOSCOPY  06/15/2010   Erosive esophagitis. Status post PEG placment   EYE SURGERY     INGUINAL HERNIA REPAIR     PROSTATE BIOPSY     SEBACEOUS CYST REMOVAL     TONSILLECTOMY     HPI:  [name] is a 82 yo male with dementia, hypertension, afib on warfarin, bipolar disorder, previous hx of throat cancer s/p chemo and radiation, who presented after being found down by his family with dark/reddish  colored vomit around him.  There is evidence of multifocal pneumonia on his chest x-ray.  Respiratory status is stable on 3 L nasal cannula, he is not on any supplemental oxygen at home.  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  Pt seen by SLP at [facility] on 09/14/20, MBS reports "Patient presents with  moderately unsafe and mildly inefficient pharyngeal function based on DIGEST grades. He has aspiration on sequential swallows of thin liquid with more frequent penetration that does not reach the TVCs. UES opening is reduced and his has a prominent cricopharyngeal bar and an apparent small Zenker's diverticulum is noted. He also has h/o EGD with dilatation for benign stenosis and today he has esophageal retention and retrograde flow through the UES. He has seen Dr. Patrice Paradise for discussion of open versus endoscopic Zenker diverticulectomy (..) His dysphagia has caused weight loss and he reports taking a mostly liquid diet."  Pt proceeded to f/u with ENT and GI who reported no Zenker's, only CP Bar, which was suspected to be the primary cause of pts problems. Pt underwent EGD with FLIP - Upper Endoscopy with Endoluminal Functional Lumen Imaging Probe which showed No endoscopic esophageal abnormality to explain patient's dysphagia.- FLIP imaging performed, consistent with normal esophageal contractility. ENT ultimately offered tx for CP bar and noted new symptom of muscle tension dysphonia. Does not seem that pt proceeded with tx with ENT or any further SLP f/u as of last ENT note on 09/04/21 as his care over the rest of the year seemed to focus on nephrology visits. Wife confirms pt did not want to pursue treatment any further.    Assessment / Plan / Recommendation  Clinical Impression  Pt demonstrates signs of dysphagia consistent with his baseline. Wife reports pt continues to cough and have occasional nasal regurgitation with meals. She feels he is relatively unchanged in function over the last year. Despite struggle he continues to eat and drink, often drinking carnation instant breakfast, but also eating meals and solids like hamburgers and fries. He has had an extensive work up with SLP, GI and ENT, ultimately finding that pt has reduced cricopharyngeal relaxtion, which would be consistnent with current  presentation. He has not wanted any interventions to address this. Currently he wants to continue eating without restriction and wife supports this. Recommend pt resume a regular diet and thin liquids with basic aspiration precautions. Pt already takes small bites and sips and follows soldis with liquids independently. No SLP f/u needed acutely. SLP Visit Diagnosis: Dysphagia, oropharyngeal phase (R13.12)    Aspiration Risk  Moderate aspiration risk;Risk for inadequate nutrition/hydration    Diet Recommendation Regular;Thin liquid   Liquid Administration via: Cup;Straw Medication Administration: Whole meds with liquid Supervision: Patient able to self feed Compensations: Slow rate;Small sips/bites;Follow solids with liquid Postural Changes: Seated upright at 90 degrees    Other  Recommendations      Recommendations for follow up therapy are one component of a multi-disciplinary discharge planning process, led by the attending physician.  Recommendations may be updated based on patient status, additional functional criteria and insurance authorization.  Follow up Recommendations No SLP follow up (Pt has not attenteded recommend OP SLP)        Swallow Study   General HPI: [name] is a 82 yo male with dementia, hypertension, afib on warfarin, bipolar disorder, previous hx of  throat cancer s/p chemo and radiation, who presented after being found down by his family with dark/reddish colored vomit around him.  There is evidence of multifocal pneumonia on his chest x-ray.  Respiratory status is stable on 3 L nasal cannula, he is not on any supplemental oxygen at home.  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  Pt seen by SLP at [facility] on 09/14/20, MBS reports "Patient presents with moderately unsafe and mildly inefficient  pharyngeal function based on DIGEST grades. He has aspiration on sequential swallows of thin liquid with more frequent penetration that does not reach the TVCs. UES opening is reduced and his has a prominent cricopharyngeal bar and an apparent small Zenker's diverticulum is noted. He also has h/o EGD with dilatation for benign stenosis and today he has esophageal retention and retrograde flow through the UES. He has seen Dr. Patrice Paradise for discussion of open versus endoscopic Zenker diverticulectomy (..) His dysphagia has caused weight loss and he reports taking a mostly liquid diet."  Pt proceeded to f/u with ENT and GI who reported no Zenker's, only CP Bar, which was suspected to be the primary cause of pts problems. Pt underwent EGD with FLIP - Upper Endoscopy with Endoluminal Functional Lumen Imaging Probe which showed No endoscopic esophageal abnormality to explain patient's dysphagia.- FLIP imaging performed, consistent with normal esophageal contractility. ENT ultimately offered tx for CP bar and noted new symptom of muscle tension dysphonia. Does not seem that pt proceeded with tx with ENT or any further SLP f/u as of last ENT note on 09/04/21 as his care over the resot of the year seemed to focus on nephrology visits. Type of Study: Bedside Swallow Evaluation Previous Swallow Assessment: see HPI Diet Prior to this Study: NPO Temperature Spikes Noted: No Respiratory Status: Room air History of Recent Intubation: No Behavior/Cognition: Alert;Cooperative;Pleasant mood Oral Cavity Assessment: Within Functional Limits Oral Care Completed by SLP: No Oral Cavity - Dentition: Adequate natural dentition Vision: Functional for self-feeding Self-Feeding Abilities: Able to feed self Patient Positioning: Upright in bed Baseline Vocal Quality: Normal Volitional Cough: Strong Volitional Swallow: Able to elicit    Oral/Motor/Sensory Function Overall Oral Motor/Sensory Function: Within functional limits   Ice  Chips     Thin Liquid Thin Liquid: Impaired Presentation: Straw Pharyngeal  Phase Impairments: Multiple swallows;Cough - Immediate;Cough - Delayed    Nectar Thick Nectar Thick Liquid: Not tested   Honey Thick Honey Thick Liquid: Not tested   Puree Puree: Impaired Presentation: Spoon;Self Fed Pharyngeal Phase Impairments: Multiple swallows;Cough - Delayed   Solid     Solid: Impaired Pharyngeal Phase Impairments: Multiple swallows;Cough - Delayed      Tommy Minichiello, Katherene Ponto 07/06/2022,3:03 PM

## 2022-07-06 NOTE — Evaluation (Signed)
Occupational Therapy Evaluation Patient Details Name: [name] MRN: [id number] DOB: 10-15-1940 Today's Date: 07/06/2022   History of Present Illness Pt is a 82 y/o male presenting on 1/25 after being found down by family.  Found with acute metabolic encephalopathy, possible aspiration PNA with chest xray showing patchy opacities in R mid and lower lung, L lung base. PMH includes: dementia, HTN, afib on warfarin, bipolar disorder, cataracts, CAD, MI, throat cancer s/p chemo and radiation.   Clinical Impression   Patient admitted for above and presents with problem list below.  Pt reports living with his spouse but is independent with ADLs, mobility. He reports he is not supposed to drive but sometimes does.  Poor historian and family not present.  He currently requires min assist +2 safety for transfers, up to mod assist +2 for functional mobility, and up to max assist +2 for ADLs.  He is on RA during session, with SpO2 maintained at rest but noted desaturation although poor pleth during mobility- re-donned at end of session. HR up to 1 40s during mobility with brief period of decreased verbalization and balance, encouraged pt to sit to rest but he refused.  Fatigue increasing impulsiveness and decreasing safety. Based on performance today, believe he will need 24/7 support at dc and maximal HH services.  Will follow acutely.      Recommendations for follow up therapy are one component of a multi-disciplinary discharge planning process, led by the attending physician.  Recommendations may be updated based on patient status, additional functional criteria and insurance authorization.   Follow Up Recommendations  Home health OT (aide)     Assistance Recommended at Discharge Frequent or constant Supervision/Assistance  Patient can return home with the following Help with stairs or ramp for entrance;Assist for transportation;Assistance with cooking/housework;Two people to help with walking  and/or transfers;Two people to help with bathing/dressing/bathroom    Functional Status Assessment  Patient has had a recent decline in their functional status and demonstrates the ability to make significant improvements in function in a reasonable and predictable amount of time.  Equipment Recommendations  BSC/3in1    Recommendations for Other Services       Precautions / Restrictions Precautions Precautions: Fall Precaution Comments: reports hx of fall where he broke both arms last year Restrictions Weight Bearing Restrictions: No      Mobility Bed Mobility Overal bed mobility: Needs Assistance Bed Mobility: Supine to Sit     Supine to sit: Min guard     General bed mobility comments: increased time and effort for coming to EoB, asked what he should pull up on and then refused to use bedrail when he was shown    Transfers Overall transfer level: Needs assistance Equipment used: Rolling walker (2 wheels) Transfers: Sit to/from Stand Sit to Stand: Min assist, +2 physical assistance           General transfer comment: min Ax2 for management of lines, and proper hand placement, good power up, assist for steadying      Balance Overall balance assessment: Needs assistance Sitting-balance support: Feet supported, Bilateral upper extremity supported, Single extremity supported Sitting balance-Leahy Scale: Poor Sitting balance - Comments: pt with advancing posterior lean, when given outside support and pt asked if he notices he is leaning back pt states "it's because you are pushing me" Postural control: Posterior lean Standing balance support: Bilateral upper extremity supported, Reliant on assistive device for balance Standing balance-Leahy Scale: Poor Standing balance comment: static standing with RW,  requires support for posterior lean requiring support during standing rest break                           ADL either performed or assessed with clinical  judgement   ADL Overall ADL's : Needs assistance/impaired     Grooming: Minimal assistance;Sitting           Upper Body Dressing : Sitting;Minimal assistance   Lower Body Dressing: Maximal assistance;+2 for physical assistance;+2 for safety/equipment;Sit to/from stand   Toilet Transfer: Minimal assistance;Moderate assistance;+2 for physical assistance;+2 for safety/equipment;Ambulation;Rolling walker (2 wheels) Toilet Transfer Details (indicate cue type and reason): simulated in room         Functional mobility during ADLs: Minimal assistance;Moderate assistance;+2 for physical assistance;+2 for safety/equipment;Rolling walker (2 wheels)       Vision   Vision Assessment?: No apparent visual deficits     Perception     Praxis      Pertinent Vitals/Pain Pain Assessment Pain Assessment: No/denies pain     Hand Dominance     Extremity/Trunk Assessment Upper Extremity Assessment Upper Extremity Assessment: Generalized weakness;Difficult to assess due to impaired cognition   Lower Extremity Assessment Lower Extremity Assessment: Defer to PT evaluation       Communication Communication Communication: No difficulties   Cognition Arousal/Alertness: Awake/alert Behavior During Therapy: Impulsive Overall Cognitive Status: History of cognitive impairments - at baseline                                 General Comments: pt poor historian, hx of dementia.  Initally follows simple commands but fades during session with fatigue and increased impulsiveness. Decreased attention, safety and probelm solving.     General Comments  pt with HR up to 140s with ambulation on RA.  SpO2 at 98% upon entry, decreased to low 90s at rest but dropped to 80s with inconsistent pleth. After mobility in sitting Spo2 in high 80s and replaced supplemental O2    Exercises     Shoulder Instructions      Home Living Family/patient expects to be discharged to:: Private  residence Living Arrangements: Spouse/significant other Available Help at Discharge: Family Type of Home: House Home Access: Stairs to enter Technical brewer of Steps: 1   Home Layout: One level     Bathroom Shower/Tub: Teacher, early years/pre: Handicapped height     Home Equipment: Grab bars - tub/shower;Hand held shower head;Shower Land (2 wheels);Rollator (4 wheels)          Prior Functioning/Environment Prior Level of Function : Patient poor historian/Family not available (hx of dementia)             Mobility Comments: independent mobility ADLs Comments: reports independent        OT Problem List: Decreased strength;Decreased activity tolerance;Impaired balance (sitting and/or standing);Decreased cognition;Decreased safety awareness;Decreased knowledge of use of DME or AE;Decreased knowledge of precautions;Cardiopulmonary status limiting activity      OT Treatment/Interventions: Self-care/ADL training;Therapeutic exercise;DME and/or AE instruction;Therapeutic activities;Patient/family education;Balance training    OT Goals(Current goals can be found in the care plan section) Acute Rehab OT Goals Patient Stated Goal: none stated OT Goal Formulation: With patient Time For Goal Achievement: 07/20/22 Potential to Achieve Goals: Fair  OT Frequency: Min 2X/week    Co-evaluation PT/OT/SLP Co-Evaluation/Treatment: Yes Reason for Co-Treatment: For patient/therapist safety;To address functional/ADL transfers;Necessary to address cognition/behavior during functional  activity PT goals addressed during session: Mobility/safety with mobility OT goals addressed during session: ADL's and self-care      AM-PAC OT "6 Clicks" Daily Activity     Outcome Measure Help from another person eating meals?: Total (NPO) Help from another person taking care of personal grooming?: A Little Help from another person toileting, which includes using toliet,  bedpan, or urinal?: A Lot Help from another person bathing (including washing, rinsing, drying)?: A Lot Help from another person to put on and taking off regular upper body clothing?: A Little Help from another person to put on and taking off regular lower body clothing?: A Lot 6 Click Score: 13   End of Session Equipment Utilized During Treatment: Gait belt;Rolling walker (2 wheels);Oxygen Nurse Communication: Mobility status  Activity Tolerance: Patient tolerated treatment well Patient left: in chair;with call bell/phone within reach;with chair alarm set  OT Visit Diagnosis: Other abnormalities of gait and mobility (R26.89);Muscle weakness (generalized) (M62.81);Other symptoms and signs involving cognitive function                Time: 6808-8110 OT Time Calculation (min): 29 min Charges:  OT General Charges $OT Visit: 1 Visit OT Evaluation $OT Eval Moderate Complexity: 1 Mod  Jolaine Artist, OT Acute Rehabilitation Services Office [phone]   [name] 07/06/2022, 1:41 PM

## 2022-07-06 NOTE — Progress Notes (Signed)
Internal Medicine Attending:   I saw and examined the patient. I reviewed the resident's note and I agree with the resident's findings and plan as documented in the resident's note.  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  History obtained from chart as patient is unable to provide history at this time.  Per chart, patient's wife found him in bed yesterday morning lying in a pool of dark liquid and seemed confused and lethargic.  She noted that he was less talkative with weak and did not perk up after giving him an energy drink.  She called EMS for further evaluation.  Over the last few days patient appeared to have worsening confusion and has been weaker than usual likely eating more towards viral walking and in general had difficulty with balance.  At baseline, he is usually able to member his wife's name and know where he is.  No fevers or chills at home.  No chest pain, no shortness of breath, no palpitations, diaphoresis.  Today, patient is unsure why he is in the hospital but does not know his name and that he is at [facility] which appears to be close to his baseline.  Vitals:   07/06/22 0745 07/06/22 1215  BP: 97/67 92/65  Pulse: 100   Resp: 18 15  Temp: 98.2 F (36.8 C) 98.2 F (36.8 C)  SpO2: 97% 92%   On exam, patient is lying in bed in no apparent distress.  Lungs with bibasilar crackles noted on exam.  Cardiovascular exam reveals tachycardia with irregularly irregular rhythm.  Abdomen is soft, nontender, nondistended with normoactive bowel sounds.  Lower extremities are nontender to palpation with no edema noted.  Patient is oriented to self place but does not know the year.  Patient follows commands today.   Strength appears to be intact and he is able to raise both his hands as well as his legs of his bed.  Patient has a normal mood and affect and appears pleasantly confused.  Acute encephalopathy likely secondary to underlying pneumonia: -Patient presented to the ED with worsening confusion after being found in a pool of vomit in his bed.  I suspect that patient developed an aspiration pneumonia after vomiting which was likely responsible for his worsening mental status.  Patient does have a history of recurrent aspiration pneumonia secondary to previous radiation to the area for his history of squamous cell carcinoma of the oropharynx. -Will continue with Unasyn for now -Patient with no leukocytosis or fevers currently -Blood cultures with no growth to date.  Respiratory panel negative for COVID and influenza. -Will maintain n.p.o. till SLP evaluation -MRI brain as well as CT head and neck with no acute pathology noted -Overall, patient would benefit from goals of care conversation with the family as given his recurrent aspiration pneumonia as well as progressive dementia he may qualify for home hospice and this may allow for additional help at home for the family -Patient was noted to be mildly hypoxic on admission and was placed on 3 L O2 via nasal cannula.  O2 sat are in the 90s today.  Will continue to monitor closely -Patient did have a mild AKI on CKD stage IIIb on admission with a creatinine of 1.95.  He was started on  IV fluids with improvement of creatinine down to 1.6 which is his baseline -No further workup at this time  2.  Demand ischemia in setting of multifocal pneumonia and A-fib with RVR: -Patient was noted to have elevated troponins up to 3000's during this admission.  However, patient denies any chest pain and his EKG did not show any acute ischemic changes.  Suspect this is demand ischemia in the setting of his aspiration pneumonia and A-fib with RVR. -Cardiology follow-up and  recommendations appreciated. -2D echo done today showed normal EF and no wall motion abnormalities.  Patient is not a good candidate for aggressive ischemic evaluation per cardiology. -Patient was initially on anticoagulation at home for his A-fib with warfarin and had an INR of 2 today.  However, patient is not a good candidate for anticoagulation per cardiology and recommend stopping warfarin at this time.  Patient also had an episode of possible hematemesis at home which would make continuing anticoagulation difficult. -Patient was noted to have borderline blood pressures with systolics in the 17H to 15A with MAPs in the 60s to 70s.  He is on metoprolol at home but this is on hold given that he is n.p.o. pending SLP evaluation and his blood pressures have been borderline --Will continue with IV amiodarone for rate control and to potentially reestablish sinus rhythm -No further workup at this time  3.  Acute blood loss anemia likely secondary to hematemesis: -Patient was found at home with dark/reddish colored vomit around him.  Patient's hemoglobin decreased from 14.3-11.2 on this admission.  I suspect some of this may be dilutional secondary to the IV fluids but some of this is likely secondary to his episode of hematemesis at home -GI follow-up and recommendations appreciated -Will continue to monitor CBC closely -Warfarin was DC'd -Will continue pantoprazole 40 mg IV twice daily -No plans for EGD at this time -Will follow-up goals of care discussion after palliative care consult.

## 2022-07-06 NOTE — Progress Notes (Signed)
Initial Nutrition Assessment  DOCUMENTATION CODES:   Non-severe (moderate) malnutrition in context of chronic illness  INTERVENTION:  Continue regular diet as tolerated. Encouraged small, frequent meals in setting of nausea.  Provide Carnation Breakfast Essentials in whole milk po TID, each supplement provides 290 kcal and 13 grams of protein.  Provide multivitamin with minerals daily.  Recommend measuring accurate weight.  NUTRITION DIAGNOSIS:   Moderate Malnutrition related to chronic illness (SCC of oropharynx s/p chemoradiation, dysphagia) as evidenced by mild fat depletion, mild muscle depletion, moderate muscle depletion.  GOAL:   Patient will meet greater than or equal to 90% of their needs  MONITOR:   PO intake, Supplement acceptance, Labs, Weight trends, I & O's  REASON FOR ASSESSMENT:   Malnutrition Screening Tool    ASSESSMENT:   82 year old male with PMHx of dementia, HTN, A-fib on anticoagulation, bipolar disorder, SCC of oropharynx s/p chemoradiation who presented with AMS after being found at home with hematemesis found to have acute encephalopathy likely secondary to PNA, demand ischemia, A-fib RVR, acute blood loss anemia likely secondary to hematemesis.  1/26: s/p SLP evaluation, pt with dysphagia at baseline felt to be related to reduced cricopharyngeal relaxation, approved for regular diet with thin liquids with aspiration precautions as pt would like to continue eating without restriction  Per review of chart plan is to discuss goals of care with patient and wife.  Met with pt and wife at bedside. Patient's appetite has been decreased for at least the past week. He typically eats 2 meals per day at baseline. He enjoys fruit, green beans, sausage biscuits, chicken, cereal, eggs, toast, and bacon. Wife reports they focus on foods that are high in calories and protein. He also drinks chocolate Carnation Breakfast Essentials in whole milk daily. Pt denies food  allergies or intolerances. He endorses having some nausea. Denies abdominal pain. Also endorses dysphagia at baseline now. Patient is amenable to drinking [employer] here with meals. Discussed importance of adequate intake of calories and protein. Encouraged intake of small, frequent meals in setting of nausea. Pt reports he is motivated to take in enough calories and protein to prevent further unintentional weight loss. He likes to stays active and even lifts weights at home.   Patient's UBW is 217-219 lbs. Wife reports pt has lost 8-10 lbs over time. Per review of chart weight is fairly stable over the past year and fluctuates between 93-95 lbs. Unsure if admission wt was truly measured. Recommend obtaining accurate weight.  Medications reviewed and include: aspirin, levothyroxine, pantoprazole, amiodarone, Unasyn, LR at 75 mL/hour  Labs reviewed: BUN 28, Creatinine 1.62  UOP: 400 mL  I/O: +1702.4 mL since admission  NUTRITION - FOCUSED PHYSICAL EXAM:  Flowsheet Row Most Recent Value  Orbital Region No depletion  Upper Arm Region Moderate depletion  Thoracic and Lumbar Region No depletion  Buccal Region Mild depletion  Temple Region Moderate depletion  Clavicle Bone Region Mild depletion  Clavicle and Acromion Bone Region No depletion  Scapular Bone Region No depletion  Dorsal Hand Moderate depletion  Patellar Region Moderate depletion  Anterior Thigh Region Mild depletion  Posterior Calf Region No depletion  Edema (RD Assessment) None  Hair Reviewed  Eyes Reviewed  Mouth Reviewed  Skin Reviewed  Nails Reviewed       Diet Order:   Diet Order             Diet regular Room service appropriate? No; Fluid consistency: Thin  Diet effective now  EDUCATION NEEDS:   Education needs have been addressed  Skin:  Skin Assessment: Reviewed RN Assessment  Last BM:  07/04/22  Height:   Ht Readings from Last 1 Encounters:  07/05/22 5'  7" (1.702 m)   Weight:   Wt Readings from Last 1 Encounters:  07/05/22 95 kg   Ideal Body Weight:  67.3 kg  BMI:  Body mass index is 32.8 kg/m.  Estimated Nutritional Needs:   Kcal:  2100-2300  Protein:  110-120 grams  Fluid:  2.1-2.3 L/day  Loanne Drilling, MS, RD, LDN, CNSC Pager number available on Amion

## 2022-07-07 DIAGNOSIS — E440 Moderate protein-calorie malnutrition: Secondary | ICD-10-CM | POA: Insufficient documentation

## 2022-07-07 DIAGNOSIS — K920 Hematemesis: Secondary | ICD-10-CM

## 2022-07-07 DIAGNOSIS — R410 Disorientation, unspecified: Secondary | ICD-10-CM

## 2022-07-07 DIAGNOSIS — G9341 Metabolic encephalopathy: Secondary | ICD-10-CM | POA: Diagnosis not present

## 2022-07-07 DIAGNOSIS — I4819 Other persistent atrial fibrillation: Secondary | ICD-10-CM

## 2022-07-07 HISTORY — DX: Disorientation, unspecified: R41.0

## 2022-07-07 HISTORY — DX: Hematemesis: K92.0

## 2022-07-07 HISTORY — DX: Moderate protein-calorie malnutrition: E44.0

## 2022-07-07 LAB — CBC
HCT: 37.7 % — ABNORMAL LOW (ref 39.0–52.0)
Hemoglobin: 11.9 g/dL — ABNORMAL LOW (ref 13.0–17.0)
MCH: 28.5 pg (ref 26.0–34.0)
MCHC: 31.6 g/dL (ref 30.0–36.0)
MCV: 90.2 fL (ref 80.0–100.0)
Platelets: 121 10*3/uL — ABNORMAL LOW (ref 150–400)
RBC: 4.18 MIL/uL — ABNORMAL LOW (ref 4.22–5.81)
RDW: 15.5 % (ref 11.5–15.5)
WBC: 7.5 10*3/uL (ref 4.0–10.5)
nRBC: 0 % (ref 0.0–0.2)

## 2022-07-07 LAB — BASIC METABOLIC PANEL
Anion gap: 9 (ref 5–15)
BUN: 14 mg/dL (ref 8–23)
CO2: 25 mmol/L (ref 22–32)
Calcium: 8.6 mg/dL — ABNORMAL LOW (ref 8.9–10.3)
Chloride: 107 mmol/L (ref 98–111)
Creatinine, Ser: 1.32 mg/dL — ABNORMAL HIGH (ref 0.61–1.24)
GFR, Estimated: 55 mL/min — ABNORMAL LOW (ref >60)
Glucose, Bld: 138 mg/dL — ABNORMAL HIGH (ref 70–99)
Potassium: 3.5 mmol/L (ref 3.5–5.1)
Sodium: 141 mmol/L (ref 135–145)

## 2022-07-07 MED ORDER — AMOXICILLIN-POT CLAVULANATE 875-125 MG PO TABS
1.0000 | ORAL_TABLET | Freq: Two times a day (BID) | ORAL | Status: DC
  Administered 2022-07-07 – 2022-07-08 (×2): 1 via ORAL
  Filled 2022-07-07 (×2): qty 1

## 2022-07-07 NOTE — Progress Notes (Signed)
[facility] ROUNDING NOTE   Subjective: No further vomiting or hematemesis Denies any nausea or abdominal pain   Objective: Vital signs in last 24 hours: Temp:  [98.6 F (37 C)-98.9 F (37.2 C)] 98.6 F (37 C) (01/27 0524) Pulse Rate:  [84-110] 84 (01/27 1200) Resp:  [16-20] 16 (01/27 0808) BP: (93-123)/(64-99) 101/66 (01/27 1200) SpO2:  [91 %-96 %] 91 % (01/27 1200) Weight:  [94.9 kg] 94.9 kg (01/27 0625) Last BM Date : 07/04/22 General: NAD Abdomen: soft, non tender or distension     Intake/Output from previous day: 01/26 0701 - 01/27 0700 In: 2813.5 [P.O.:240; I.V.:2173.5; IV Piggyback:400] Out: 2300 [Urine:2300] Intake/Output this shift: Total I/O In: 360 [P.O.:360] Out: -    Lab Results: Recent Labs    07/05/22 1322 07/05/22 1334 07/05/22 1335 07/06/22 0316 07/07/22 1037  WBC 9.1  --   --  9.9 7.5  HGB 13.5   < > 13.9 11.2* 11.9*  PLT 151  --   --  138* 121*  MCV 90.0  --   --  90.1 90.2   < > = values in this interval not displayed.   BMET Recent Labs    07/05/22 1322 07/05/22 1334 07/05/22 1335 07/06/22 0316 07/07/22 1037  NA 140 139 138 139 141  K 4.6 4.7 4.7 4.1 3.5  CL 107 105  --  108 107  CO2 24  --   --  25 25  GLUCOSE 125* 115*  --  107* 138*  BUN 29* 34*  --  28* 14  CREATININE 1.95* 2.00*  --  1.62* 1.32*  CALCIUM 8.9  --   --  8.3* 8.6*   LFT Recent Labs    07/05/22 1322  PROT 6.0*  ALBUMIN 3.4*  AST 31  ALT 17  ALKPHOS 47  BILITOT 0.6   PT/INR Recent Labs    07/05/22 1322  INR 2.0*      Imaging/Other results: ECHOCARDIOGRAM COMPLETE  Result Date: 07/06/2022    ECHOCARDIOGRAM REPORT   Patient Name:   [name] Date of Exam: 07/06/2022 Medical Rec #:  [id number]              Height:       67.0 in Accession #:    [id number]             Weight:       209.4 lb Date of Birth:  [birth year]               BSA:          2.062 m Patient Age:    82 years               BP:           83/56 mmHg Patient  Gender: M                      HR:           102 bpm. Exam Location:  Inpatient Procedure: 2D Echo, Color Doppler and Cardiac Doppler Indications:    elevated troponin  History:        Patient has prior history of Echocardiogram examinations.                 Previous Myocardial Infarction and CAD, Arrythmias:Atrial                 Fibrillation and RBBB; Risk Factors:Hypertension.  Sonographer:    Phineas Douglas  Referring Phys: [id number] [place]  1. Left ventricular ejection fraction, by estimation, is 55 to 60%. The left ventricle has normal function. The left ventricle has no regional wall motion abnormalities. There is moderate left ventricular hypertrophy. Left ventricular diastolic parameters are indeterminate.  2. Right ventricular systolic function is normal. The right ventricular size is normal.  3. Left atrial size was moderately dilated.  4. The mitral valve is abnormal. Mild mitral valve regurgitation. No evidence of mitral stenosis.  5. Fused right and left cusp suspect there is at least mild stenosis Consider having patient return for dedicated CW doppler interrogation of AV. The aortic valve is normal in structure. There is moderate calcification of the aortic valve. There is moderate thickening of the aortic valve. Aortic valve regurgitation is mild. Aortic valve sclerosis/calcification is present, without any evidence of aortic stenosis.  6. The inferior vena cava is normal in size with greater than 50% respiratory variability, suggesting right atrial pressure of 3 mmHg. FINDINGS  Left Ventricle: Left ventricular ejection fraction, by estimation, is 55 to 60%. The left ventricle has normal function. The left ventricle has no regional wall motion abnormalities. The left ventricular internal cavity size was normal in size. There is  moderate left ventricular hypertrophy. Left ventricular diastolic parameters are indeterminate. Right Ventricle: The right ventricular size is normal. No  increase in right ventricular wall thickness. Right ventricular systolic function is normal. Left Atrium: Left atrial size was moderately dilated. Right Atrium: Right atrial size was normal in size. Pericardium: There is no evidence of pericardial effusion. Mitral Valve: The mitral valve is abnormal. There is mild thickening of the mitral valve leaflet(s). There is mild calcification of the mitral valve leaflet(s). Mild mitral annular calcification. Mild mitral valve regurgitation. No evidence of mitral valve stenosis. Tricuspid Valve: The tricuspid valve is normal in structure. Tricuspid valve regurgitation is mild . No evidence of tricuspid stenosis. Aortic Valve: Fused right and left cusp suspect there is at least mild stenosis Consider having patient return for dedicated CW doppler interrogation of AV. The aortic valve is normal in structure. There is moderate calcification of the aortic valve. There is moderate thickening of the aortic valve. Aortic valve regurgitation is mild. Aortic regurgitation PHT measures 443 msec. Aortic valve sclerosis/calcification is present, without any evidence of aortic stenosis. Pulmonic Valve: The pulmonic valve was normal in structure. Pulmonic valve regurgitation is not visualized. No evidence of pulmonic stenosis. Aorta: The aortic root is normal in size and structure. Venous: The inferior vena cava is normal in size with greater than 50% respiratory variability, suggesting right atrial pressure of 3 mmHg. IAS/Shunts: No atrial level shunt detected by color flow Doppler.  LEFT VENTRICLE PLAX 2D LVIDd:         4.60 cm      Diastology LVIDs:         3.10 cm      LV e' medial:    9.14 cm/s LV PW:         1.30 cm      LV E/e' medial:  7.3 LV IVS:        1.50 cm      LV e' lateral:   8.70 cm/s LVOT diam:     2.10 cm      LV E/e' lateral: 7.6 LV SV:         43 LV SV Index:   21 LVOT Area:     3.46 cm  LV Volumes (MOD) LV vol d,  MOD A2C: 124.0 ml LV vol d, MOD A4C: 101.0 ml LV vol  s, MOD A2C: 53.7 ml LV vol s, MOD A4C: 42.3 ml LV SV MOD A2C:     70.3 ml LV SV MOD A4C:     101.0 ml LV SV MOD BP:      71.4 ml RIGHT VENTRICLE             IVC RV Basal diam:  3.80 cm     IVC diam: 2.60 cm RV S prime:     10.10 cm/s TAPSE (M-mode): 1.5 cm LEFT ATRIUM             Index        RIGHT ATRIUM           Index LA diam:        4.30 cm 2.09 cm/m   RA Area:     20.90 cm LA Vol (A2C):   96.7 ml 46.89 ml/m  RA Volume:   61.00 ml  29.58 ml/m LA Vol (A4C):   99.2 ml 48.10 ml/m LA Biplane Vol: 97.7 ml 47.38 ml/m  AORTIC VALVE LVOT Vmax:   78.30 cm/s LVOT Vmean:  48.700 cm/s LVOT VTI:    0.125 m AI PHT:      443 msec  AORTA Ao Root diam: 3.40 cm Ao Asc diam:  3.20 cm MITRAL VALVE MV Area (PHT): 5.16 cm    SHUNTS MV Decel Time: 147 msec    Systemic VTI:  0.12 m MV E velocity: 66.40 cm/s  Systemic Diam: 2.10 cm Jenkins Rouge MD Electronically signed by Jenkins Rouge MD Signature Date/Time: 07/06/2022/10:06:05 AM    Final    MR BRAIN WO CONTRAST  Result Date: 07/05/2022 CLINICAL DATA:  Neuro deficit, acute, stroke suspected Mental status change, unknown cause EXAM: MRI HEAD WITHOUT CONTRAST TECHNIQUE: Multiplanar, multiecho pulse sequences of the brain and surrounding structures were obtained without intravenous contrast. COMPARISON:  Same day CT head. FINDINGS: Brain: No acute infarction, hemorrhage, hydrocephalus, extra-axial collection or mass lesion. Chronic microvascular ischemic disease. Vascular: Major arterial flow voids are maintained at the skull base. Skull and upper cervical spine: Normal marrow signal. Sinuses/Orbits: Negative. Other: No sizable mastoid effusions. IMPRESSION: No evidence of acute intracranial abnormality. Electronically Signed   By: Margaretha Sheffield M.D.   On: 07/05/2022 18:58   CT Head Wo Contrast  Result Date: 07/05/2022 CLINICAL DATA:  Head trauma, minor (Age >= 65y) EXAM: CT HEAD WITHOUT CONTRAST TECHNIQUE: Contiguous axial images were obtained from the base of the skull  through the vertex without intravenous contrast. RADIATION DOSE REDUCTION: This exam was performed according to the departmental dose-optimization program which includes automated exposure control, adjustment of the mA and/or kV according to patient size and/or use of iterative reconstruction technique. COMPARISON:  May 04, 2022. FINDINGS: Brain: No evidence of acute infarction, hemorrhage, hydrocephalus, extra-axial collection or mass lesion/mass effect. Patchy white matter hypodensities nonspecific but compatible with chronic microvascular ischemic disease. Vascular: No hyperdense vessel. Skull: No acute fracture. Sinuses/Orbits: Anterior right ethmoid air cell osteoma. No acute orbital findings. Other: No mastoid effusions IMPRESSION: No evidence of acute intracranial abnormality. Electronically Signed   By: Margaretha Sheffield M.D.   On: 07/05/2022 14:33   CT Cervical Spine Wo Contrast  Result Date: 07/05/2022 CLINICAL DATA:  Neck trauma EXAM: CT CERVICAL SPINE WITHOUT CONTRAST TECHNIQUE: Multidetector CT imaging of the cervical spine was performed without intravenous contrast. Multiplanar CT image reconstructions were also generated. RADIATION DOSE REDUCTION: This exam  was performed according to the departmental dose-optimization program which includes automated exposure control, adjustment of the mA and/or kV according to patient size and/or use of iterative reconstruction technique. COMPARISON:  None Available. FINDINGS: Alignment: Facet joints are aligned without dislocation or traumatic listhesis. Dens and lateral masses are aligned. Degenerative facet mediated grade 1 anterolisthesis of C4 on C5. Skull base and vertebrae: No acute fracture. No primary bone lesion or focal pathologic process. Soft tissues and spinal canal: No prevertebral fluid or swelling. No visible canal hematoma. Disc levels: Degenerative disc disease of the C4-5 through C6-7 levels. Multilevel facet arthropathy. The left C4-5  facet joint is fused. Upper chest: Negative. Other: Bilateral carotid atherosclerosis. Small calcified left anterior chain cervical lymph nodes. IMPRESSION: 1. No acute fracture or traumatic listhesis of the cervical spine. 2. Multilevel degenerative disc disease and facet arthropathy. Electronically Signed   By: Davina Poke D.O.   On: 07/05/2022 14:04   DG Chest Port 1 View  Result Date: 07/05/2022 CLINICAL DATA:  Vomiting EXAM: PORTABLE CHEST 1 VIEW COMPARISON:  CXR 05/04/22 FINDINGS: No pleural effusion. No pneumothorax. There are patchy opacities in the right mid and lower lung, as well as the left lung base. These are worrisome for multifocal infection, possibly related to aspiration. No displaced rib fractures. Visualized upper abdomen is notable for gaseous distention of the colon at the splenic flexure. IMPRESSION: Patchy opacities in the right mid and lower lung, as well as the left lung base. These are worrisome for multifocal infection, possibly related to aspiration. Electronically Signed   By: Marin Roberts M.D.   On: 07/05/2022 13:33      Assessment &Plan  82 yr old very pleasant gentleman with dementia, presented with altered mental status and A-fib with RVR Elevated troponin, demand ischemia secondary to A-fib with RVR, cardiology is following patient  ?  Coffee-ground emesis, hemoglobin is stable with no further episodes since admission No plan for endoscopic procedure Continue diet as tolerated Pantoprazole 40 mg oral twice daily  Palliative care consult pending to address goals of care  GI will sign off but available if have any questions or change in clinical status.    Damaris Hippo , MD [phone]  [facility]

## 2022-07-07 NOTE — Progress Notes (Signed)
HD#2 SUBJECTIVE:  Patient Summary: [name] is a 82 y.o. 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   Overnight Events: No acute events overnight  Interim History: This is hospital day 2. Alert to person and place. He states that he didn't sleep well last night. Hasn't had breakfast as he is just waking up. No abdominal pain, chest pain, or dyspnea.   OBJECTIVE:  Vital Signs: Vitals:   07/06/22 2356 07/07/22 0056 07/07/22 0524 07/07/22 0625  BP: 94/68 106/87 (!) 123/99   Pulse: 95     Resp:      Temp:   98.6 F (37 C)   TempSrc:   Oral   SpO2: 91%  96%   Weight:    94.9 kg  Height:       Supplemental O2: Nasal Cannula SpO2: 96 % O2 Flow Rate (L/min): 4 L/min  Filed Weights   07/05/22 1217 07/07/22 0625  Weight: 95 kg 94.9 kg     Intake/Output Summary (Last 24 hours) at 07/07/2022 0649 Last data filed at 07/07/2022 0532 Gross per 24 hour  Intake 2813.53 ml  Output 2300 ml  Net 513.53 ml   Net IO Since Admission: 2,215.91 mL [07/07/22 0649]  Physical Exam: General: chronically ill appearing male laying in bed. No acute distress. CV: Irregular rhythm. Systolic murmur appreciated Pulmonary: Lungs CTAB. Normal work of breathing Abdominal: Soft, nontender, nondistended.  Extremities: No edema Skin: Warm and dry.  Neuro: Alert and oriented to self and place. Grip strength weaker on R, moves all extremities against gravity.  Psych: Normal mood and affect    ASSESSMENT/PLAN:  Assessment: Principal Problem:   Metabolic encephalopathy Active Problems:   Aspiration pneumonia (HCC)   Acute metabolic encephalopathy   Plan: Acute encephalopathy likely secondary to underlying pneumonia Dementia Acute hypoxic respiratory  failure 2/2 aspiration pneumonia Bipolar disorder Patient presented with a few days of worsening confusion, weakness and was found down the morning of admission in a pool of dark vomit.  He is alert and oriented x 1-2 at baseline and can perform some of his ADLs.  MRI ruled out stroke. Patient is on several centrally acting medications including ativan, seroquel, and lamictal at home.  - Will continue Seroquel, Lamictal for bipolar.   - Continue scheduled ativan 1 mg bid (long term med) - Continue unasyn for aspiration pneumonia, will transition to Augmentin to complete 5 day course prior to discharge - Wean supplemental O2 as able   Demand ischemia secondary to multifocal pneumonia and afib with RVR Afib with RVR CAD Troponin peaked at 3k, without any evidence of ischemic changes on EKG. Suspect demand ischemia is secondary to aspiration pneunomia and afib with RVR. Echo with normal EF and no wall motion abnormalities. Of note, the patient is not a good candidate for aggressive ischemic evaluation per cardiology.  - Continue amio gtt - Metoprolol 50 mg bid - Holding warfarin in the setting of acute blood loss anemia - Continue rosuvastatin and aspirin  Acute blood loss anemia Morning CBC has not been drawn yet, but there have been no further episodes of hematemesis since admission. No plan for endoscopic evaluation per GI. - Daily CBC - Continue protonix 40 mg bid  CKD 3b Cr 1.6 yesterday which is his baseline, morning BMP still has not been drawn.  -  Daily BMP - Avoid nephrotoxins  Hypothyroidism - Synthroid 125 mcg  Goals of care Discussed [place] with family at bedside on 1/26- patient is DNR, however, they would like to continue full scope of care and are not interested in discussions with palliative care at this time. Patient is certainly a risk for continually aspirating and developing aspiration pneumonia. Additionally, anticoagulation will be an ongoing issue, as it is being held  temporarily in the setting of possible GI bleed, and this must be balanced with the risk of stroke given his hx of afib  Best Practice: Diet: Regular diet IVF: Fluids: none VTE: Held Code: DNR AB: Unasyn Therapy Recs: Home Health, DME: bedside commode Family Contact: Wife, to be notified. DISPO: Anticipated discharge in 1-3 days to Home pending  medical stabillity .  Signature: Buddy Duty, D.O.  Internal Medicine Resident, PGY-2 [facility]  Pager: [phone] 6:49 AM, 07/07/2022   Please contact the on call pager after 5 pm and on weekends at [phone].

## 2022-07-07 NOTE — Progress Notes (Signed)
Patient converted to normal sinus rhythm; RN obtained EKG and notified Dr. Raymondo Band.

## 2022-07-07 NOTE — Progress Notes (Signed)
Rounding Note    Patient Name: [name] Date of Encounter: 07/07/2022  [place] Cardiologist: Dr Quentin Ore  Subjective   No CP or dyspnea  Inpatient Medications    Scheduled Meds:  aspirin  81 mg Oral Daily   lamoTRIgine  100 mg Oral QHS   levothyroxine  125 mcg Oral Daily   LORazepam  1 mg Oral BID   metoprolol tartrate  50 mg Oral BID   multivitamin with minerals  1 tablet Oral Daily   pantoprazole (PROTONIX) IV  40 mg Intravenous Q12H   QUEtiapine  200 mg Oral QPM   rosuvastatin  10 mg Oral QPM   Continuous Infusions:  amiodarone 30 mg/hr (07/07/22 0532)   ampicillin-sulbactam (UNASYN) IV Stopped (07/07/22 0250)   lactated ringers 75 mL/hr at 07/07/22 0532   PRN Meds: acetaminophen **OR** acetaminophen, guaiFENesin, mouth rinse, polyethylene glycol, senna-docusate   Vital Signs    Vitals:   07/06/22 2356 07/07/22 0056 07/07/22 0524 07/07/22 0625  BP: 94/68 106/87 (!) 123/99   Pulse: 95     Resp:      Temp:   98.6 F (37 C)   TempSrc:   Oral   SpO2: 91%  96%   Weight:    94.9 kg  Height:        Intake/Output Summary (Last 24 hours) at 07/07/2022 0758 Last data filed at 07/07/2022 0532 Gross per 24 hour  Intake 2813.53 ml  Output 2300 ml  Net 513.53 ml       07/07/2022    6:25 AM 07/05/2022   12:17 PM 05/09/2022    2:45 PM  Last 3 Weights  Weight (lbs) 209 lb 3.5 oz 209 lb 7 oz 211 lb  Weight (kg) 94.9 kg 95 kg 95.709 kg      Telemetry    Atrial fibrillation with rapid ventricular response- Personally Reviewed   Physical Exam   GEN: NAD Neck: supple Cardiac: irregular; 2/6 systolic murmur Respiratory: CTA GI: Soft, NT/ND MS: No edema Neuro:  Grossly intact Psych: Normal affect   Labs    High Sensitivity Troponin:   Recent Labs  Lab 07/05/22 1515 07/05/22 1916 07/06/22 0316 07/06/22 0807 07/06/22 0952  TROPONINIHS 1,667* 2,468* 3,064* 2,949* 2,604*      Chemistry Recent Labs  Lab 07/05/22 1322  07/05/22 1334 07/05/22 1335 07/06/22 0316  NA 140 139 138 139  K 4.6 4.7 4.7 4.1  CL 107 105  --  108  CO2 24  --   --  25  GLUCOSE 125* 115*  --  107*  BUN 29* 34*  --  28*  CREATININE 1.95* 2.00*  --  1.62*  CALCIUM 8.9  --   --  8.3*  PROT 6.0*  --   --   --   ALBUMIN 3.4*  --   --   --   AST 31  --   --   --   ALT 17  --   --   --   ALKPHOS 47  --   --   --   BILITOT 0.6  --   --   --   GFRNONAA 34*  --   --  43*  ANIONGAP 9  --   --  6    Hematology Recent Labs  Lab 07/05/22 1322 07/05/22 1334 07/05/22 1335 07/06/22 0316  WBC 9.1  --   --  9.9  RBC 4.71  --   --  3.93*  HGB  13.5 14.3 13.9 11.2*  HCT 42.4 42.0 41.0 35.4*  MCV 90.0  --   --  90.1  MCH 28.7  --   --  28.5  MCHC 31.8  --   --  31.6  RDW 15.1  --   --  15.5  PLT 151  --   --  138*     Radiology    ECHOCARDIOGRAM COMPLETE  Result Date: 07/06/2022    ECHOCARDIOGRAM REPORT   Patient Name:   [name] Date of Exam: 07/06/2022 Medical Rec #:  [id number]              Height:       67.0 in Accession #:    [id number]             Weight:       209.4 lb Date of Birth:  [birth year]               BSA:          2.062 m Patient Age:    56 years               BP:           83/56 mmHg Patient Gender: M                      HR:           102 bpm. Exam Location:  Inpatient Procedure: 2D Echo, Color Doppler and Cardiac Doppler Indications:    elevated troponin  History:        Patient has prior history of Echocardiogram examinations.                 Previous Myocardial Infarction and CAD, Arrythmias:Atrial                 Fibrillation and RBBB; Risk Factors:Hypertension.  Sonographer:    Phineas Douglas Referring Phys: [id number] [place]  1. Left ventricular ejection fraction, by estimation, is 55 to 60%. The left ventricle has normal function. The left ventricle has no regional wall motion abnormalities. There is moderate left ventricular hypertrophy. Left ventricular diastolic parameters are  indeterminate.  2. Right ventricular systolic function is normal. The right ventricular size is normal.  3. Left atrial size was moderately dilated.  4. The mitral valve is abnormal. Mild mitral valve regurgitation. No evidence of mitral stenosis.  5. Fused right and left cusp suspect there is at least mild stenosis Consider having patient return for dedicated CW doppler interrogation of AV. The aortic valve is normal in structure. There is moderate calcification of the aortic valve. There is moderate thickening of the aortic valve. Aortic valve regurgitation is mild. Aortic valve sclerosis/calcification is present, without any evidence of aortic stenosis.  6. The inferior vena cava is normal in size with greater than 50% respiratory variability, suggesting right atrial pressure of 3 mmHg. FINDINGS  Left Ventricle: Left ventricular ejection fraction, by estimation, is 55 to 60%. The left ventricle has normal function. The left ventricle has no regional wall motion abnormalities. The left ventricular internal cavity size was normal in size. There is  moderate left ventricular hypertrophy. Left ventricular diastolic parameters are indeterminate. Right Ventricle: The right ventricular size is normal. No increase in right ventricular wall thickness. Right ventricular systolic function is normal. Left Atrium: Left atrial size was moderately dilated. Right Atrium: Right atrial size was normal in size. Pericardium: There is no evidence of  pericardial effusion. Mitral Valve: The mitral valve is abnormal. There is mild thickening of the mitral valve leaflet(s). There is mild calcification of the mitral valve leaflet(s). Mild mitral annular calcification. Mild mitral valve regurgitation. No evidence of mitral valve stenosis. Tricuspid Valve: The tricuspid valve is normal in structure. Tricuspid valve regurgitation is mild . No evidence of tricuspid stenosis. Aortic Valve: Fused right and left cusp suspect there is at least  mild stenosis Consider having patient return for dedicated CW doppler interrogation of AV. The aortic valve is normal in structure. There is moderate calcification of the aortic valve. There is moderate thickening of the aortic valve. Aortic valve regurgitation is mild. Aortic regurgitation PHT measures 443 msec. Aortic valve sclerosis/calcification is present, without any evidence of aortic stenosis. Pulmonic Valve: The pulmonic valve was normal in structure. Pulmonic valve regurgitation is not visualized. No evidence of pulmonic stenosis. Aorta: The aortic root is normal in size and structure. Venous: The inferior vena cava is normal in size with greater than 50% respiratory variability, suggesting right atrial pressure of 3 mmHg. IAS/Shunts: No atrial level shunt detected by color flow Doppler.  LEFT VENTRICLE PLAX 2D LVIDd:         4.60 cm      Diastology LVIDs:         3.10 cm      LV e' medial:    9.14 cm/s LV PW:         1.30 cm      LV E/e' medial:  7.3 LV IVS:        1.50 cm      LV e' lateral:   8.70 cm/s LVOT diam:     2.10 cm      LV E/e' lateral: 7.6 LV SV:         43 LV SV Index:   21 LVOT Area:     3.46 cm  LV Volumes (MOD) LV vol d, MOD A2C: 124.0 ml LV vol d, MOD A4C: 101.0 ml LV vol s, MOD A2C: 53.7 ml LV vol s, MOD A4C: 42.3 ml LV SV MOD A2C:     70.3 ml LV SV MOD A4C:     101.0 ml LV SV MOD BP:      71.4 ml RIGHT VENTRICLE             IVC RV Basal diam:  3.80 cm     IVC diam: 2.60 cm RV S prime:     10.10 cm/s TAPSE (M-mode): 1.5 cm LEFT ATRIUM             Index        RIGHT ATRIUM           Index LA diam:        4.30 cm 2.09 cm/m   RA Area:     20.90 cm LA Vol (A2C):   96.7 ml 46.89 ml/m  RA Volume:   61.00 ml  29.58 ml/m LA Vol (A4C):   99.2 ml 48.10 ml/m LA Biplane Vol: 97.7 ml 47.38 ml/m  AORTIC VALVE LVOT Vmax:   78.30 cm/s LVOT Vmean:  48.700 cm/s LVOT VTI:    0.125 m AI PHT:      443 msec  AORTA Ao Root diam: 3.40 cm Ao Asc diam:  3.20 cm MITRAL VALVE MV Area (PHT): 5.16 cm     SHUNTS MV Decel Time: 147 msec    Systemic VTI:  0.12 m MV E velocity: 66.40 cm/s  Systemic  Diam: 2.10 cm Jenkins Rouge MD Electronically signed by Jenkins Rouge MD Signature Date/Time: 07/06/2022/10:06:05 AM    Final    MR BRAIN WO CONTRAST  Result Date: 07/05/2022 CLINICAL DATA:  Neuro deficit, acute, stroke suspected Mental status change, unknown cause EXAM: MRI HEAD WITHOUT CONTRAST TECHNIQUE: Multiplanar, multiecho pulse sequences of the brain and surrounding structures were obtained without intravenous contrast. COMPARISON:  Same day CT head. FINDINGS: Brain: No acute infarction, hemorrhage, hydrocephalus, extra-axial collection or mass lesion. Chronic microvascular ischemic disease. Vascular: Major arterial flow voids are maintained at the skull base. Skull and upper cervical spine: Normal marrow signal. Sinuses/Orbits: Negative. Other: No sizable mastoid effusions. IMPRESSION: No evidence of acute intracranial abnormality. Electronically Signed   By: Margaretha Sheffield M.D.   On: 07/05/2022 18:58   CT Head Wo Contrast  Result Date: 07/05/2022 CLINICAL DATA:  Head trauma, minor (Age >= 65y) EXAM: CT HEAD WITHOUT CONTRAST TECHNIQUE: Contiguous axial images were obtained from the base of the skull through the vertex without intravenous contrast. RADIATION DOSE REDUCTION: This exam was performed according to the departmental dose-optimization program which includes automated exposure control, adjustment of the mA and/or kV according to patient size and/or use of iterative reconstruction technique. COMPARISON:  May 04, 2022. FINDINGS: Brain: No evidence of acute infarction, hemorrhage, hydrocephalus, extra-axial collection or mass lesion/mass effect. Patchy white matter hypodensities nonspecific but compatible with chronic microvascular ischemic disease. Vascular: No hyperdense vessel. Skull: No acute fracture. Sinuses/Orbits: Anterior right ethmoid air cell osteoma. No acute orbital findings. Other: No  mastoid effusions IMPRESSION: No evidence of acute intracranial abnormality. Electronically Signed   By: Margaretha Sheffield M.D.   On: 07/05/2022 14:33   CT Cervical Spine Wo Contrast  Result Date: 07/05/2022 CLINICAL DATA:  Neck trauma EXAM: CT CERVICAL SPINE WITHOUT CONTRAST TECHNIQUE: Multidetector CT imaging of the cervical spine was performed without intravenous contrast. Multiplanar CT image reconstructions were also generated. RADIATION DOSE REDUCTION: This exam was performed according to the departmental dose-optimization program which includes automated exposure control, adjustment of the mA and/or kV according to patient size and/or use of iterative reconstruction technique. COMPARISON:  None Available. FINDINGS: Alignment: Facet joints are aligned without dislocation or traumatic listhesis. Dens and lateral masses are aligned. Degenerative facet mediated grade 1 anterolisthesis of C4 on C5. Skull base and vertebrae: No acute fracture. No primary bone lesion or focal pathologic process. Soft tissues and spinal canal: No prevertebral fluid or swelling. No visible canal hematoma. Disc levels: Degenerative disc disease of the C4-5 through C6-7 levels. Multilevel facet arthropathy. The left C4-5 facet joint is fused. Upper chest: Negative. Other: Bilateral carotid atherosclerosis. Small calcified left anterior chain cervical lymph nodes. IMPRESSION: 1. No acute fracture or traumatic listhesis of the cervical spine. 2. Multilevel degenerative disc disease and facet arthropathy. Electronically Signed   By: Davina Poke D.O.   On: 07/05/2022 14:04   DG Chest Port 1 View  Result Date: 07/05/2022 CLINICAL DATA:  Vomiting EXAM: PORTABLE CHEST 1 VIEW COMPARISON:  CXR 05/04/22 FINDINGS: No pleural effusion. No pneumothorax. There are patchy opacities in the right mid and lower lung, as well as the left lung base. These are worrisome for multifocal infection, possibly related to aspiration. No displaced rib  fractures. Visualized upper abdomen is notable for gaseous distention of the colon at the splenic flexure. IMPRESSION: Patchy opacities in the right mid and lower lung, as well as the left lung base. These are worrisome for multifocal infection, possibly related to aspiration. Electronically Signed  By: Marin Roberts M.D.   On: 07/05/2022 13:33    Patient Profile     82 year old male with past medical history of coronary artery disease based on coronary calcification, paroxysmal atrial fibrillation status post ablation on chronic Coumadin, hypertension, hyperlipidemia, mild aortic stenosis, renal insufficiency, bipolar disorder, dementia admitted with increasing confusion and agitation for evaluation of elevated troponin. Patient had CTA December 2023 in anticipation of watchman placement. Calcium score was noted to be 1875 which was 84th percentile. Per patient's wife he has had slow decline over the past several years with worsening confusion. Patient was admitted due to progressive weakness and inability to get out of bed.  Echocardiogram this admission shows normal LV function, moderate left ventricular hypertrophy, moderate left atrial enlargement, mild mitral regurgitation and probable mild aortic stenosis.  Troponin mildly elevated and cardiology asked to evaluate.  Assessment & Plan    1 elevated troponin-occurred in the setting of aspiration pneumonia and renal insufficiency.  He denies chest pain and no diagnostic ECG changes noted.  Echocardiogram shows normal LV function.  No plans for further ischemia evaluation.     2 history of paroxysmal atrial fibrillation-patient developed recurrent atrial fibrillation and rate is high normal to mildly elevated.  Continue IV amiodarone.  Continue metoprolol.  Given his overall medical condition I do not think he is a good candidate for long-term anticoagulation and Coumadin has been discontinued.  He was being considered for Watchman device but at  present I do not think he is a good candidate for this procedure.  Can reconsider if he improves.   3 coronary calcification-noted on recent CT scan.  Continue ASA and statin.  If he makes significant improvement could consider outpatient nuclear study to screen for ischemia.  However he appears to be declining significantly per his wife.  Best option likely medical therapy.   4 hypertension-blood pressure borderline.  Will follow.   5 hyperlipidemia-continue statin.   6 chronic stage IIIb kidney disease-follow renal function while in house.  7 aspiration pneumonia-antibiotics per primary service.  8 encephalopathy/bipolar disorder   For questions or updates, please contact [place] Please consult [email or web address] for contact info under        Signed, Kirk Ruths, MD  07/07/2022, 7:58 AM

## 2022-07-08 DIAGNOSIS — J690 Pneumonitis due to inhalation of food and vomit: Secondary | ICD-10-CM | POA: Diagnosis not present

## 2022-07-08 DIAGNOSIS — I480 Paroxysmal atrial fibrillation: Secondary | ICD-10-CM | POA: Diagnosis not present

## 2022-07-08 DIAGNOSIS — G9341 Metabolic encephalopathy: Secondary | ICD-10-CM | POA: Diagnosis not present

## 2022-07-08 LAB — BASIC METABOLIC PANEL
Anion gap: 8 (ref 5–15)
BUN: 12 mg/dL (ref 8–23)
CO2: 25 mmol/L (ref 22–32)
Calcium: 8.6 mg/dL — ABNORMAL LOW (ref 8.9–10.3)
Chloride: 107 mmol/L (ref 98–111)
Creatinine, Ser: 1.31 mg/dL — ABNORMAL HIGH (ref 0.61–1.24)
GFR, Estimated: 55 mL/min — ABNORMAL LOW (ref >60)
Glucose, Bld: 93 mg/dL (ref 70–99)
Potassium: 3.5 mmol/L (ref 3.5–5.1)
Sodium: 140 mmol/L (ref 135–145)

## 2022-07-08 LAB — PROTIME-INR
INR: 1.3 — ABNORMAL HIGH (ref 0.8–1.2)
Prothrombin Time: 15.6 seconds — ABNORMAL HIGH (ref 11.4–15.2)

## 2022-07-08 LAB — CBC
HCT: 35.5 % — ABNORMAL LOW (ref 39.0–52.0)
Hemoglobin: 11 g/dL — ABNORMAL LOW (ref 13.0–17.0)
MCH: 28.4 pg (ref 26.0–34.0)
MCHC: 31 g/dL (ref 30.0–36.0)
MCV: 91.5 fL (ref 80.0–100.0)
Platelets: 128 10*3/uL — ABNORMAL LOW (ref 150–400)
RBC: 3.88 MIL/uL — ABNORMAL LOW (ref 4.22–5.81)
RDW: 15.5 % (ref 11.5–15.5)
WBC: 7.2 10*3/uL (ref 4.0–10.5)
nRBC: 0 % (ref 0.0–0.2)

## 2022-07-08 LAB — GLUCOSE, CAPILLARY: Glucose-Capillary: 123 mg/dL — ABNORMAL HIGH (ref 70–99)

## 2022-07-08 MED ORDER — AMIODARONE HCL 200 MG PO TABS: ORAL_TABLET | ORAL | 0 refills | Status: DC

## 2022-07-08 MED ORDER — ENOXAPARIN SODIUM 40 MG/0.4ML IJ SOSY: 100.0000 mg | PREFILLED_SYRINGE | Freq: Two times a day (BID) | INTRAMUSCULAR | 0 refills | Status: DC

## 2022-07-08 MED ORDER — AMIODARONE HCL 200 MG PO TABS
200.0000 mg | ORAL_TABLET | Freq: Two times a day (BID) | ORAL | Status: DC
  Administered 2022-07-08: 200 mg via ORAL
  Filled 2022-07-08: qty 1

## 2022-07-08 MED ORDER — AMOXICILLIN-POT CLAVULANATE 875-125 MG PO TABS: 1.0000 | ORAL_TABLET | Freq: Two times a day (BID) | ORAL | 0 refills | Status: AC

## 2022-07-08 MED ORDER — WARFARIN SODIUM 5 MG PO TABS: 6.2500 mg | ORAL_TABLET | Freq: Every day | ORAL | Status: DC

## 2022-07-08 MED ORDER — ASPIRIN 81 MG PO CHEW: 81.0000 mg | CHEWABLE_TABLET | Freq: Every day | ORAL | 1 refills | Status: DC

## 2022-07-08 MED ORDER — PANTOPRAZOLE SODIUM 40 MG PO TBEC
40.0000 mg | DELAYED_RELEASE_TABLET | Freq: Two times a day (BID) | ORAL | Status: DC
  Administered 2022-07-08: 40 mg via ORAL
  Filled 2022-07-08: qty 1

## 2022-07-08 NOTE — Progress Notes (Signed)
Rounding Note    Patient Name: [name] Date of Encounter: 07/08/2022  [facility] Cardiologist: Dr Quentin Ore  Subjective   Pt denies CP or dyspnea  Inpatient Medications    Scheduled Meds:  amoxicillin-clavulanate  1 tablet Oral Q12H   aspirin  81 mg Oral Daily   lamoTRIgine  100 mg Oral QHS   levothyroxine  125 mcg Oral Daily   LORazepam  1 mg Oral BID   metoprolol tartrate  50 mg Oral BID   multivitamin with minerals  1 tablet Oral Daily   pantoprazole (PROTONIX) IV  40 mg Intravenous Q12H   QUEtiapine  200 mg Oral QPM   rosuvastatin  10 mg Oral QPM   Continuous Infusions:  amiodarone 30 mg/hr (07/08/22 0722)   PRN Meds: acetaminophen **OR** acetaminophen, guaiFENesin, mouth rinse, polyethylene glycol, senna-docusate   Vital Signs    Vitals:   07/07/22 1530 07/07/22 2119 07/08/22 0024 07/08/22 0431  BP: 112/71 136/75 116/70 119/65  Pulse: 76 82 73 79  Resp: '18  20 20  '$ Temp: 98.1 F (36.7 C) 97.8 F (36.6 C) (!) 97.2 F (36.2 C) 98.7 F (37.1 C)  TempSrc: Oral Oral Axillary Axillary  SpO2: 91% 92% 93% 97%  Weight:      Height:        Intake/Output Summary (Last 24 hours) at 07/08/2022 0748 Last data filed at 07/08/2022 1610 Gross per 24 hour  Intake 980.2 ml  Output 950 ml  Net 30.2 ml       07/07/2022    6:25 AM 07/05/2022   12:17 PM 05/09/2022    2:45 PM  Last 3 Weights  Weight (lbs) 209 lb 3.5 oz 209 lb 7 oz 211 lb  Weight (kg) 94.9 kg 95 kg 95.709 kg      Telemetry    Sinus with PVCs- Personally Reviewed   Physical Exam   GEN: NAD, WD Neck: No JVD Cardiac: irregular; 3/6 systolic murmur, no rub Respiratory: CTA; no wheeze GI: Soft, NT/ND, no masses MS: No edema Neuro:  No focal findings Psych: Normal affect   Labs    High Sensitivity Troponin:   Recent Labs  Lab 07/05/22 1515 07/05/22 1916 07/06/22 0316 07/06/22 0807 07/06/22 0952  TROPONINIHS 1,667* 2,468* 3,064* 2,949* 2,604*       Chemistry Recent Labs  Lab 07/05/22 1322 07/05/22 1334 07/06/22 0316 07/07/22 1037 07/08/22 0150  NA 140   < > 139 141 140  K 4.6   < > 4.1 3.5 3.5  CL 107   < > 108 107 107  CO2 24  --  '25 25 25  '$ GLUCOSE 125*   < > 107* 138* 93  BUN 29*   < > 28* 14 12  CREATININE 1.95*   < > 1.62* 1.32* 1.31*  CALCIUM 8.9  --  8.3* 8.6* 8.6*  PROT 6.0*  --   --   --   --   ALBUMIN 3.4*  --   --   --   --   AST 31  --   --   --   --   ALT 17  --   --   --   --   ALKPHOS 47  --   --   --   --   BILITOT 0.6  --   --   --   --   GFRNONAA 34*  --  43* 55* 55*  ANIONGAP 9  --  '6 9 8   '$ < > =  values in this interval not displayed.    Hematology Recent Labs  Lab 07/06/22 0316 07/07/22 1037 07/08/22 0150  WBC 9.9 7.5 7.2  RBC 3.93* 4.18* 3.88*  HGB 11.2* 11.9* 11.0*  HCT 35.4* 37.7* 35.5*  MCV 90.1 90.2 91.5  MCH 28.5 28.5 28.4  MCHC 31.6 31.6 31.0  RDW 15.5 15.5 15.5  PLT 138* 121* 128*     Radiology    ECHOCARDIOGRAM COMPLETE  Result Date: 07/06/2022    ECHOCARDIOGRAM REPORT   Patient Name:   [name] Date of Exam: 07/06/2022 Medical Rec #:  [id number]              Height:       67.0 in Accession #:    [id number]             Weight:       209.4 lb Date of Birth:  1940/11/04               BSA:          2.062 m Patient Age:    82 years               BP:           83/56 mmHg Patient Gender: M                      HR:           102 bpm. Exam Location:  Inpatient Procedure: 2D Echo, Color Doppler and Cardiac Doppler Indications:    elevated troponin  History:        Patient has prior history of Echocardiogram examinations.                 Previous Myocardial Infarction and CAD, Arrythmias:Atrial                 Fibrillation and RBBB; Risk Factors:Hypertension.  Sonographer:    Phineas Douglas Referring Phys: [id number] [place]  1. Left ventricular ejection fraction, by estimation, is 55 to 60%. The left ventricle has normal function. The left ventricle has no regional  wall motion abnormalities. There is moderate left ventricular hypertrophy. Left ventricular diastolic parameters are indeterminate.  2. Right ventricular systolic function is normal. The right ventricular size is normal.  3. Left atrial size was moderately dilated.  4. The mitral valve is abnormal. Mild mitral valve regurgitation. No evidence of mitral stenosis.  5. Fused right and left cusp suspect there is at least mild stenosis Consider having patient return for dedicated CW doppler interrogation of AV. The aortic valve is normal in structure. There is moderate calcification of the aortic valve. There is moderate thickening of the aortic valve. Aortic valve regurgitation is mild. Aortic valve sclerosis/calcification is present, without any evidence of aortic stenosis.  6. The inferior vena cava is normal in size with greater than 50% respiratory variability, suggesting right atrial pressure of 3 mmHg. FINDINGS  Left Ventricle: Left ventricular ejection fraction, by estimation, is 55 to 60%. The left ventricle has normal function. The left ventricle has no regional wall motion abnormalities. The left ventricular internal cavity size was normal in size. There is  moderate left ventricular hypertrophy. Left ventricular diastolic parameters are indeterminate. Right Ventricle: The right ventricular size is normal. No increase in right ventricular wall thickness. Right ventricular systolic function is normal. Left Atrium: Left atrial size was moderately dilated. Right Atrium: Right atrial size was normal in size. Pericardium:  There is no evidence of pericardial effusion. Mitral Valve: The mitral valve is abnormal. There is mild thickening of the mitral valve leaflet(s). There is mild calcification of the mitral valve leaflet(s). Mild mitral annular calcification. Mild mitral valve regurgitation. No evidence of mitral valve stenosis. Tricuspid Valve: The tricuspid valve is normal in structure. Tricuspid valve  regurgitation is mild . No evidence of tricuspid stenosis. Aortic Valve: Fused right and left cusp suspect there is at least mild stenosis Consider having patient return for dedicated CW doppler interrogation of AV. The aortic valve is normal in structure. There is moderate calcification of the aortic valve. There is moderate thickening of the aortic valve. Aortic valve regurgitation is mild. Aortic regurgitation PHT measures 443 msec. Aortic valve sclerosis/calcification is present, without any evidence of aortic stenosis. Pulmonic Valve: The pulmonic valve was normal in structure. Pulmonic valve regurgitation is not visualized. No evidence of pulmonic stenosis. Aorta: The aortic root is normal in size and structure. Venous: The inferior vena cava is normal in size with greater than 50% respiratory variability, suggesting right atrial pressure of 3 mmHg. IAS/Shunts: No atrial level shunt detected by color flow Doppler.  LEFT VENTRICLE PLAX 2D LVIDd:         4.60 cm      Diastology LVIDs:         3.10 cm      LV e' medial:    9.14 cm/s LV PW:         1.30 cm      LV E/e' medial:  7.3 LV IVS:        1.50 cm      LV e' lateral:   8.70 cm/s LVOT diam:     2.10 cm      LV E/e' lateral: 7.6 LV SV:         43 LV SV Index:   21 LVOT Area:     3.46 cm  LV Volumes (MOD) LV vol d, MOD A2C: 124.0 ml LV vol d, MOD A4C: 101.0 ml LV vol s, MOD A2C: 53.7 ml LV vol s, MOD A4C: 42.3 ml LV SV MOD A2C:     70.3 ml LV SV MOD A4C:     101.0 ml LV SV MOD BP:      71.4 ml RIGHT VENTRICLE             IVC RV Basal diam:  3.80 cm     IVC diam: 2.60 cm RV S prime:     10.10 cm/s TAPSE (M-mode): 1.5 cm LEFT ATRIUM             Index        RIGHT ATRIUM           Index LA diam:        4.30 cm 2.09 cm/m   RA Area:     20.90 cm LA Vol (A2C):   96.7 ml 46.89 ml/m  RA Volume:   61.00 ml  29.58 ml/m LA Vol (A4C):   99.2 ml 48.10 ml/m LA Biplane Vol: 97.7 ml 47.38 ml/m  AORTIC VALVE LVOT Vmax:   78.30 cm/s LVOT Vmean:  48.700 cm/s LVOT VTI:     0.125 m AI PHT:      443 msec  AORTA Ao Root diam: 3.40 cm Ao Asc diam:  3.20 cm MITRAL VALVE MV Area (PHT): 5.16 cm    SHUNTS MV Decel Time: 147 msec    Systemic VTI:  0.12 m MV E  velocity: 66.40 cm/s  Systemic Diam: 2.10 cm Jenkins Rouge MD Electronically signed by Jenkins Rouge MD Signature Date/Time: 07/06/2022/10:06:05 AM    Final     Patient Profile     82 year old male with past medical history of coronary artery disease based on coronary calcification, paroxysmal atrial fibrillation status post ablation on chronic Coumadin, hypertension, hyperlipidemia, mild aortic stenosis, renal insufficiency, bipolar disorder, dementia admitted with increasing confusion and agitation for evaluation of elevated troponin. Patient had CTA December 2023 in anticipation of watchman placement. Calcium score was noted to be 1875 which was 84th percentile. Per patient's wife he has had slow decline over the past several years with worsening confusion. Patient was admitted due to progressive weakness and inability to get out of bed.  Echocardiogram this admission shows normal LV function, moderate left ventricular hypertrophy, moderate left atrial enlargement, mild mitral regurgitation and probable mild aortic stenosis.  Troponin mildly elevated and cardiology asked to evaluate.  Assessment & Plan    1 elevated troponin-occurred in the setting of aspiration pneumonia and renal insufficiency.  He has not had chest pain and no diagnostic ECG changes noted.  Echocardiogram shows normal LV function.  Trend is not consistent with acute coronary syndrome.  Would not pursue further ischemia evaluation.    2 history of paroxysmal atrial fibrillation-patient has converted to sinus rhythm.  Will change IV amiodarone to p.o. (200 mg twice daily for 1 week then 200 mg daily thereafter).  Continue metoprolol. Given his overall medical condition I do not think he is a good candidate for long-term anticoagulation and Coumadin has been  discontinued.  He was being considered for Watchman device but at present I do not think he is a good candidate for this procedure.  Can reconsider if he improves.   3 coronary calcification-noted on recent CT scan.  Continue ASA and statin.  If he makes significant improvement could consider outpatient nuclear study to screen for ischemia.  However he appears to be declining significantly per his wife.  Best option likely medical therapy.   4 hypertension-blood pressure controlled.  Continue present medical regimen.   5 hyperlipidemia-continue statin.   6 chronic stage IIIb kidney disease-follow renal function while in house.  7 aspiration pneumonia-antibiotics per primary service.  8 encephalopathy/bipolar disorder  9 Aortic stenosis-FU as outpt   For questions or updates, please contact [place] Please consult [email or web address] for contact info under        Signed, Kirk Ruths, MD  07/08/2022, 7:48 AM

## 2022-07-08 NOTE — TOC Progression Note (Signed)
Transition of Care [facility]) - Progression Note    Patient Details  Name: [name] MRN: [id number] Date of Birth: 1940/09/18  [facility] CM/SW Contact  Mar Walmer G., RN Phone Number: 07/08/2022, 2:06 PM  Clinical Narrative:     [name] is a 82 yo male with dementia, hypertension, afib on warfarin, bipolar disorder, previous hx of throat cancer s/p chemo and radiation, who presented after being found down by his family.  RNCM received order for oxygen.  RNCM contacted [place] with [employer] and confirmation received.  Oxygen to be delivered to patient's room prior to discharge.   Expected Discharge Plan: [place] Barriers to Discharge: Continued Medical Work up  Expected Discharge Plan and Services   Discharge Planning Services: CM Consult Post Acute Care Choice: [place] arrangements for the past 2 months: Single Family Home Expected Discharge Date: 07/08/22               DME Arranged: Oxygen DME Agency: [employer] Date DME Agency Contacted: 07/08/22 Time DME Agency Contacted: [phone] Representative spoke with at DME Agency: [place]: RN, Disease Management, PT, OT, Nurse's Aide [place] Agency: [facility] Date [place]: 07/06/22 Time [place]: 35 Representative spoke with at [place]: [place] ([place]) Interventions [place]: No Food Insecurity (07/06/2022)  Housing: Low Risk  (07/06/2022)  Transportation Needs: No Transportation Needs (07/06/2022)  Utilities: Not At Risk (07/06/2022)  Depression (PHQ2-9): Low Risk  (10/31/2021)  Tobacco Use: Medium Risk (07/05/2022)   Readmission Risk Interventions     No data to display

## 2022-07-08 NOTE — Discharge Summary (Signed)
Name: [name] MRN: [id number] DOB: 07-Feb-1941 82 y.o. PCP: [name], MD  Date of Admission: 07/05/2022 12:09 PM Date of Discharge:  07/08/2022 Attending Physician: Dr.  Johnnye Sima  DISCHARGE DIAGNOSIS:  Primary Problem: Metabolic encephalopathy   Hospital Problems: Principal Problem:   Metabolic encephalopathy Active Problems:   Atrial fibrillation (HCC)   Bipolar disorder (HCC)   Aspiration pneumonia ([place])   Acute metabolic encephalopathy   Coffee ground emesis   Confusion   Malnutrition of moderate degree    DISCHARGE MEDICATIONS:   Allergies as of 07/08/2022       Reactions   Trileptal [oxcarbazepine] Rash        Medication List     STOP taking these medications    warfarin 2.5 MG tablet Commonly known as: COUMADIN       TAKE these medications    albuterol (2.5 MG/3ML) 0.083% nebulizer solution Commonly known as: PROVENTIL Take 2.5 mg by nebulization every 6 (six) hours as needed for wheezing or shortness of breath.   amiodarone 200 MG tablet Commonly known as: PACERONE Take 1 tablet (200 mg total) by mouth 2 (two) times daily for 7 days, THEN 1 tablet (200 mg total) daily. Start taking on: July 08, 2022   amoxicillin-clavulanate 875-125 MG tablet Commonly known as: AUGMENTIN Take 1 tablet by mouth every 12 (twelve) hours for 4 days.   aspirin 81 MG chewable tablet Chew 1 tablet (81 mg total) by mouth daily.   famotidine 40 MG tablet Commonly known as: PEPCID Take 40 mg by mouth at bedtime.   Fish Oil 1000 MG Cpdr Take 2,000 mg by mouth 2 (two) times daily.   furosemide 20 MG tablet Commonly known as: LASIX Take 20 mg by mouth daily as needed for edema or fluid.   lamoTRIgine 100 MG tablet Commonly known as: LAMICTAL Take 100 mg by mouth at bedtime.   levothyroxine 125 MCG tablet Commonly known as: SYNTHROID Take 125 mcg by mouth daily.   LORazepam 1 MG tablet Commonly known as: ATIVAN Take 1 mg by mouth 2  (two) times daily.   metoprolol tartrate 50 MG tablet Commonly known as: LOPRESSOR Take 1 tablet (50 mg total) by mouth 2 (two) times daily.   omeprazole 40 MG capsule Commonly known as: PRILOSEC Take 40 mg by mouth 2 (two) times daily.   polyethylene glycol 17 g packet Commonly known as: MIRALAX / GLYCOLAX Take 17 g by mouth daily as needed for constipation.   QUEtiapine 400 MG tablet Commonly known as: SEROQUEL Take 200 mg by mouth every evening.   rosuvastatin 10 MG tablet Commonly known as: CRESTOR Take 10 mg by mouth every evening.   STOOL SOFTENER LAXATIVE PO Take 1 tablet by mouth as needed for constipation.   Vitamin D3 125 MCG (5000 UT) Tabs Take 5,000 Units by mouth daily.        DISPOSITION AND FOLLOW-UP:  Mr.Vaibhav [place] [name] was discharged from [facility] in Stable condition. At the hospital follow up visit please address:  Acute encephalopathy: Patient has dementia at baseline, assess neuro status since completing course of abx for aspiration pneumonia. Getting home health PT/OT  Aspiration pneumonia: Discharged with 4 days of Augmentin  Afib: Warfarin DISCONTINUED. Continued on metoprolol and amiodarone was started. Converted to NSR prior to discharge  Follow-up Recommendations: Consults: Cardiology Labs:  none Studies: EKG New Medications: Amiodarone 200 mg bid x 7 days followed by 200 mg daily; Augmentin bid x  4 days Discontinued Medications: Warfarin  Follow-up Appointments:  [place] up.   Specialty: Home Health Services Why: Physical Therapy, Occupational Therapy, Aide, Registered Nurse-office to call with visit times. Contact information: [place] [address] [phone]         [name], MD. Go in 3 day(s).   Specialty: Family Medicine Why: hospital follow up Contact information: [place]  [id number] [phone]         Lelon Perla, MD. Schedule an appointment as soon as possible for a visit.   Specialty: Cardiology Contact information: [address] [place] [employer] [place] [phone] [place] COURSE:  Patient Summary: Acute encephalopathy secondary to underlying pneumonia Moderate dementia Bipolar disorder Patient presented with a few days of worsening confusion, weakness and was found down in a pool of dark vomit.  He is alert and oriented x 1-2 at baseline and can perform some of his ADLs.  Workup was concerning for aspiration pneumonia, troponinemia, AKI with mildly elevated BUN.  Of note, he is also on several centrally acting medications including Ativan, Seroquel,, Lamictal.  He does have some focal neurological deficits including weak Grip strength on the right hand as well as worsening aphasia, word finding difficulties, and gait/balance abnormalities which his wife stated started earlier this week, although MRI ruled out acute intracranial abnormality. The patient was treated with IV unasyn for 3 days and was discharged with Augmentin for 4 days, to complete a 7 day course for aspiration pneumonia.   Demand ischemia in setting of multifocal pneumonia and A-fib with RVR Afib on warfarin s/p prior ablation Patient was noted to have elevated troponins up to 3000's during this admission.  However, patient denies any chest pain and his EKG did not show any acute ischemic changes.  Suspect this is demand ischemia in the setting of his aspiration pneumonia and A-fib with RVR. Patient was not a candidate for any ischemic evaluation per cardiology. He was on metoprolol 50 mg bid and warfarin at home for afib. Continued on metoprolol and started on amiodarone per cardiology. Converted to normal sinus rhythm the day prior to discharge. Warfarin was discontinued by cardiology, as he is high risk for bleeding.   AKI on CKD 3B Recent  baseline creatinine approximately 1.6.  Presenting with CR 1.95, BUN elevated 29.  Likely prerenal in the setting of vomiting, but could also have GI losses given concern for hematemesis. Cr improved to 1.3 on the day of discharge.   Acute blood loss anemia  Patient was found down initially in a pool of dark vomit, concerning for GI bleed. He had no further episodes of hematemesis this adimssion and Hb was stable. GI evaluated the patient and did not plan for any endoscopic evaluation. Warfarin discontinued in the setting of high risk bleeding, as noted above.    Hypothyroidism Continued home Synthroid 125  Goals of care Discussed [place] with family at bedside on 1/26- patient is DNR, however, they would like to continue full scope of care and are not interested in discussions with palliative care at this time. Patient is certainly a risk for continually aspirating and developing aspiration pneumonia. Additionally, anticoagulation will be an ongoing issue, as it is being held given his high risk of bleeding, despite his hx of afib  DISCHARGE INSTRUCTIONS:   Discharge Instructions     Call MD for:  difficulty breathing, headache or visual disturbances   Complete by: As directed    Call MD for:  persistant dizziness or light-headedness   Complete by: As directed    Call MD for:  persistant nausea and vomiting   Complete by: As directed    Call MD for:  temperature >100.4   Complete by: As directed    Diet - low sodium heart healthy   Complete by: As directed    Discharge instructions   Complete by: As directed    Dear Mr. Chimento,  You were hospitalized for worsening weakness and confusion that was caused by an aspiration pneumonia. You will need to take Augmentin twice a day for 4 more days, to finish your antibiotic course! This will treat the pneumonia.  For your Afib (irregular heart rhythm), you will need to keep taking metoprolol. The cardiologist put you on a new medicine called  amiodarone - you will take 200 mg twice a day for a week, then you will take 200 mg once a day after that. You should STOP taking warfarin (and any blood thinners: do not take warfarin, xarelto, eliquis, lovenox, etc), as your risk of bleeding from a fall is quite high. You can take a baby aspirin once a day instead. I would make an appointment to see your cardiologist when you leave the hospital, as well.  We are glad you are feeling better! We have also arranged home health physical and occupational therapy for you, to help build up your strength at home. Please make an appointment to see your primary care physician this coming week, for a hospital follow up.   Increase activity slowly   Complete by: As directed        SUBJECTIVE:  Patient evaluated at the bedside this morning. Has no acute concerns except that he is hungry and wants breakfast. Denies chest pain or SOB.   Discharge Vitals:   BP (!) 176/90 (BP Location: Left Arm)   Pulse 79   Temp 98.8 F (37.1 C) (Axillary)   Resp 20   Ht '5\' 7"'$  (1.702 m)   Wt 94.9 kg   SpO2 97%   BMI 32.77 kg/m   OBJECTIVE:  General: chronically ill appearing male laying in bed. No acute distress. CV: RRR. Systolic murmur  Pulmonary: Lungs CTAB. Normal effort. Abdominal: Soft, nontender, nondistended.  Skin: Warm and dry. No obvious rash or lesions. Neuro: Alert and oriented to self and place. Grip strength weaker on R. Moves all extremities against gravity Psych: Normal mood and affect    Pertinent Labs, Studies, and Procedures:     Latest Ref Rng & Units 07/08/2022    1:50 AM 07/07/2022   10:37 AM 07/06/2022    3:16 AM  CBC  WBC 4.0 - 10.5 K/uL 7.2  7.5  9.9   Hemoglobin 13.0 - 17.0 g/dL 11.0  11.9  11.2   Hematocrit 39.0 - 52.0 % 35.5  37.7  35.4   Platelets 150 - 400 K/uL 128  121  138        Latest Ref Rng & Units 07/08/2022    1:50 AM 07/07/2022   10:37 AM 07/06/2022    3:16 AM  CMP  Glucose 70 - 99 mg/dL 93  138  107   BUN 8 -  23 mg/dL '12  14  28   '$ Creatinine 0.61 - 1.24 mg/dL 1.31  1.32  1.62  Sodium 135 - 145 mmol/L 140  141  139   Potassium 3.5 - 5.1 mmol/L 3.5  3.5  4.1   Chloride 98 - 111 mmol/L 107  107  108   CO2 22 - 32 mmol/L '25  25  25   '$ Calcium 8.9 - 10.3 mg/dL 8.6  8.6  8.3     ECHOCARDIOGRAM COMPLETE  Result Date: 07/06/2022    ECHOCARDIOGRAM REPORT   Patient Name:   Yamen Castrogiovanni [name] Date of Exam: 07/06/2022 Medical Rec #:  [id number]              Height:       67.0 in Accession #:    [id number]             Weight:       209.4 lb Date of Birth:  1940-07-30               BSA:          2.062 m Patient Age:    40 years               BP:           83/56 mmHg Patient Gender: M                      HR:           102 bpm. Exam Location:  Inpatient Procedure: 2D Echo, Color Doppler and Cardiac Doppler Indications:    elevated troponin  History:        Patient has prior history of Echocardiogram examinations.                 Previous Myocardial Infarction and CAD, Arrythmias:Atrial                 Fibrillation and RBBB; Risk Factors:Hypertension.  Sonographer:    Phineas Douglas Referring Phys: [id number] [place]  1. Left ventricular ejection fraction, by estimation, is 55 to 60%. The left ventricle has normal function. The left ventricle has no regional wall motion abnormalities. There is moderate left ventricular hypertrophy. Left ventricular diastolic parameters are indeterminate.  2. Right ventricular systolic function is normal. The right ventricular size is normal.  3. Left atrial size was moderately dilated.  4. The mitral valve is abnormal. Mild mitral valve regurgitation. No evidence of mitral stenosis.  5. Fused right and left cusp suspect there is at least mild stenosis Consider having patient return for dedicated CW doppler interrogation of AV. The aortic valve is normal in structure. There is moderate calcification of the aortic valve. There is moderate thickening of the aortic valve. Aortic  valve regurgitation is mild. Aortic valve sclerosis/calcification is present, without any evidence of aortic stenosis.  6. The inferior vena cava is normal in size with greater than 50% respiratory variability, suggesting right atrial pressure of 3 mmHg. FINDINGS  Left Ventricle: Left ventricular ejection fraction, by estimation, is 55 to 60%. The left ventricle has normal function. The left ventricle has no regional wall motion abnormalities. The left ventricular internal cavity size was normal in size. There is  moderate left ventricular hypertrophy. Left ventricular diastolic parameters are indeterminate. Right Ventricle: The right ventricular size is normal. No increase in right ventricular wall thickness. Right ventricular systolic function is normal. Left Atrium: Left atrial size was moderately dilated. Right Atrium: Right atrial size was normal in size. Pericardium: There is no evidence of pericardial effusion. Mitral Valve: The mitral  valve is abnormal. There is mild thickening of the mitral valve leaflet(s). There is mild calcification of the mitral valve leaflet(s). Mild mitral annular calcification. Mild mitral valve regurgitation. No evidence of mitral valve stenosis. Tricuspid Valve: The tricuspid valve is normal in structure. Tricuspid valve regurgitation is mild . No evidence of tricuspid stenosis. Aortic Valve: Fused right and left cusp suspect there is at least mild stenosis Consider having patient return for dedicated CW doppler interrogation of AV. The aortic valve is normal in structure. There is moderate calcification of the aortic valve. There is moderate thickening of the aortic valve. Aortic valve regurgitation is mild. Aortic regurgitation PHT measures 443 msec. Aortic valve sclerosis/calcification is present, without any evidence of aortic stenosis. Pulmonic Valve: The pulmonic valve was normal in structure. Pulmonic valve regurgitation is not visualized. No evidence of pulmonic stenosis.  Aorta: The aortic root is normal in size and structure. Venous: The inferior vena cava is normal in size with greater than 50% respiratory variability, suggesting right atrial pressure of 3 mmHg. IAS/Shunts: No atrial level shunt detected by color flow Doppler.  LEFT VENTRICLE PLAX 2D LVIDd:         4.60 cm      Diastology LVIDs:         3.10 cm      LV e' medial:    9.14 cm/s LV PW:         1.30 cm      LV E/e' medial:  7.3 LV IVS:        1.50 cm      LV e' lateral:   8.70 cm/s LVOT diam:     2.10 cm      LV E/e' lateral: 7.6 LV SV:         43 LV SV Index:   21 LVOT Area:     3.46 cm  LV Volumes (MOD) LV vol d, MOD A2C: 124.0 ml LV vol d, MOD A4C: 101.0 ml LV vol s, MOD A2C: 53.7 ml LV vol s, MOD A4C: 42.3 ml LV SV MOD A2C:     70.3 ml LV SV MOD A4C:     101.0 ml LV SV MOD BP:      71.4 ml RIGHT VENTRICLE             IVC RV Basal diam:  3.80 cm     IVC diam: 2.60 cm RV S prime:     10.10 cm/s TAPSE (M-mode): 1.5 cm LEFT ATRIUM             Index        RIGHT ATRIUM           Index LA diam:        4.30 cm 2.09 cm/m   RA Area:     20.90 cm LA Vol (A2C):   96.7 ml 46.89 ml/m  RA Volume:   61.00 ml  29.58 ml/m LA Vol (A4C):   99.2 ml 48.10 ml/m LA Biplane Vol: 97.7 ml 47.38 ml/m  AORTIC VALVE LVOT Vmax:   78.30 cm/s LVOT Vmean:  48.700 cm/s LVOT VTI:    0.125 m AI PHT:      443 msec  AORTA Ao Root diam: 3.40 cm Ao Asc diam:  3.20 cm MITRAL VALVE MV Area (PHT): 5.16 cm    SHUNTS MV Decel Time: 147 msec    Systemic VTI:  0.12 m MV E velocity: 66.40 cm/s  Systemic Diam: 2.10 cm Jenkins Rouge MD Electronically  signed by Jenkins Rouge MD Signature Date/Time: 07/06/2022/10:06:05 AM    Final    MR BRAIN WO CONTRAST  Result Date: 07/05/2022 CLINICAL DATA:  Neuro deficit, acute, stroke suspected Mental status change, unknown cause EXAM: MRI HEAD WITHOUT CONTRAST TECHNIQUE: Multiplanar, multiecho pulse sequences of the brain and surrounding structures were obtained without intravenous contrast. COMPARISON:  Same day CT  head. FINDINGS: Brain: No acute infarction, hemorrhage, hydrocephalus, extra-axial collection or mass lesion. Chronic microvascular ischemic disease. Vascular: Major arterial flow voids are maintained at the skull base. Skull and upper cervical spine: Normal marrow signal. Sinuses/Orbits: Negative. Other: No sizable mastoid effusions. IMPRESSION: No evidence of acute intracranial abnormality. Electronically Signed   By: Margaretha Sheffield M.D.   On: 07/05/2022 18:58   CT Head Wo Contrast  Result Date: 07/05/2022 CLINICAL DATA:  Head trauma, minor (Age >= 65y) EXAM: CT HEAD WITHOUT CONTRAST TECHNIQUE: Contiguous axial images were obtained from the base of the skull through the vertex without intravenous contrast. RADIATION DOSE REDUCTION: This exam was performed according to the departmental dose-optimization program which includes automated exposure control, adjustment of the mA and/or kV according to patient size and/or use of iterative reconstruction technique. COMPARISON:  May 04, 2022. FINDINGS: Brain: No evidence of acute infarction, hemorrhage, hydrocephalus, extra-axial collection or mass lesion/mass effect. Patchy white matter hypodensities nonspecific but compatible with chronic microvascular ischemic disease. Vascular: No hyperdense vessel. Skull: No acute fracture. Sinuses/Orbits: Anterior right ethmoid air cell osteoma. No acute orbital findings. Other: No mastoid effusions IMPRESSION: No evidence of acute intracranial abnormality. Electronically Signed   By: Margaretha Sheffield M.D.   On: 07/05/2022 14:33   CT Cervical Spine Wo Contrast  Result Date: 07/05/2022 CLINICAL DATA:  Neck trauma EXAM: CT CERVICAL SPINE WITHOUT CONTRAST TECHNIQUE: Multidetector CT imaging of the cervical spine was performed without intravenous contrast. Multiplanar CT image reconstructions were also generated. RADIATION DOSE REDUCTION: This exam was performed according to the departmental dose-optimization program  which includes automated exposure control, adjustment of the mA and/or kV according to patient size and/or use of iterative reconstruction technique. COMPARISON:  None Available. FINDINGS: Alignment: Facet joints are aligned without dislocation or traumatic listhesis. Dens and lateral masses are aligned. Degenerative facet mediated grade 1 anterolisthesis of C4 on C5. Skull base and vertebrae: No acute fracture. No primary bone lesion or focal pathologic process. Soft tissues and spinal canal: No prevertebral fluid or swelling. No visible canal hematoma. Disc levels: Degenerative disc disease of the C4-5 through C6-7 levels. Multilevel facet arthropathy. The left C4-5 facet joint is fused. Upper chest: Negative. Other: Bilateral carotid atherosclerosis. Small calcified left anterior chain cervical lymph nodes. IMPRESSION: 1. No acute fracture or traumatic listhesis of the cervical spine. 2. Multilevel degenerative disc disease and facet arthropathy. Electronically Signed   By: Davina Poke D.O.   On: 07/05/2022 14:04   DG Chest Port 1 View  Result Date: 07/05/2022 CLINICAL DATA:  Vomiting EXAM: PORTABLE CHEST 1 VIEW COMPARISON:  CXR 05/04/22 FINDINGS: No pleural effusion. No pneumothorax. There are patchy opacities in the right mid and lower lung, as well as the left lung base. These are worrisome for multifocal infection, possibly related to aspiration. No displaced rib fractures. Visualized upper abdomen is notable for gaseous distention of the colon at the splenic flexure. IMPRESSION: Patchy opacities in the right mid and lower lung, as well as the left lung base. These are worrisome for multifocal infection, possibly related to aspiration. Electronically Signed   By: Marin Olp.D.  On: 07/05/2022 13:33     Signed: Buddy Duty, D.O.  Internal Medicine Resident, PGY-2 [facility]  Pager: [phone] 11:20 AM, 07/08/2022

## 2022-07-10 ENCOUNTER — Ambulatory Visit: Payer: Medicare Other

## 2022-07-10 LAB — CULTURE, BLOOD (ROUTINE X 2)
Culture: NO GROWTH
Culture: NO GROWTH

## 2022-07-12 DIAGNOSIS — M25522 Pain in left elbow: Secondary | ICD-10-CM | POA: Diagnosis not present

## 2022-07-12 DIAGNOSIS — Z556 Problems related to health literacy: Secondary | ICD-10-CM | POA: Diagnosis not present

## 2022-07-12 DIAGNOSIS — I2510 Atherosclerotic heart disease of native coronary artery without angina pectoris: Secondary | ICD-10-CM | POA: Diagnosis not present

## 2022-07-12 DIAGNOSIS — Z9981 Dependence on supplemental oxygen: Secondary | ICD-10-CM | POA: Diagnosis not present

## 2022-07-12 DIAGNOSIS — Z7982 Long term (current) use of aspirin: Secondary | ICD-10-CM | POA: Diagnosis not present

## 2022-07-12 DIAGNOSIS — Z9181 History of falling: Secondary | ICD-10-CM | POA: Diagnosis not present

## 2022-07-12 DIAGNOSIS — Z85828 Personal history of other malignant neoplasm of skin: Secondary | ICD-10-CM | POA: Diagnosis not present

## 2022-07-12 DIAGNOSIS — F0390 Unspecified dementia without behavioral disturbance: Secondary | ICD-10-CM | POA: Diagnosis not present

## 2022-07-12 DIAGNOSIS — F02B0 Dementia in other diseases classified elsewhere, moderate, without behavioral disturbance, psychotic disturbance, mood disturbance, and anxiety: Secondary | ICD-10-CM | POA: Diagnosis not present

## 2022-07-12 DIAGNOSIS — Z09 Encounter for follow-up examination after completed treatment for conditions other than malignant neoplasm: Secondary | ICD-10-CM | POA: Diagnosis not present

## 2022-07-12 DIAGNOSIS — Z8521 Personal history of malignant neoplasm of larynx: Secondary | ICD-10-CM | POA: Diagnosis not present

## 2022-07-12 DIAGNOSIS — N1832 Chronic kidney disease, stage 3b: Secondary | ICD-10-CM | POA: Diagnosis not present

## 2022-07-12 DIAGNOSIS — D62 Acute posthemorrhagic anemia: Secondary | ICD-10-CM | POA: Diagnosis not present

## 2022-07-12 DIAGNOSIS — F319 Bipolar disorder, unspecified: Secondary | ICD-10-CM | POA: Diagnosis not present

## 2022-07-12 DIAGNOSIS — G9341 Metabolic encephalopathy: Secondary | ICD-10-CM | POA: Diagnosis not present

## 2022-07-12 DIAGNOSIS — I4891 Unspecified atrial fibrillation: Secondary | ICD-10-CM | POA: Diagnosis not present

## 2022-07-12 DIAGNOSIS — E440 Moderate protein-calorie malnutrition: Secondary | ICD-10-CM | POA: Diagnosis not present

## 2022-07-12 DIAGNOSIS — E039 Hypothyroidism, unspecified: Secondary | ICD-10-CM | POA: Diagnosis not present

## 2022-07-12 DIAGNOSIS — E785 Hyperlipidemia, unspecified: Secondary | ICD-10-CM | POA: Diagnosis not present

## 2022-07-12 DIAGNOSIS — Z6830 Body mass index (BMI) 30.0-30.9, adult: Secondary | ICD-10-CM | POA: Diagnosis not present

## 2022-07-12 DIAGNOSIS — J690 Pneumonitis due to inhalation of food and vomit: Secondary | ICD-10-CM | POA: Diagnosis not present

## 2022-07-14 DIAGNOSIS — R531 Weakness: Secondary | ICD-10-CM | POA: Diagnosis not present

## 2022-07-14 DIAGNOSIS — R918 Other nonspecific abnormal finding of lung field: Secondary | ICD-10-CM | POA: Diagnosis not present

## 2022-07-14 DIAGNOSIS — R5383 Other fatigue: Secondary | ICD-10-CM | POA: Diagnosis not present

## 2022-07-15 DIAGNOSIS — I252 Old myocardial infarction: Secondary | ICD-10-CM | POA: Diagnosis not present

## 2022-07-15 DIAGNOSIS — Z87440 Personal history of urinary (tract) infections: Secondary | ICD-10-CM | POA: Diagnosis not present

## 2022-07-15 DIAGNOSIS — R41841 Cognitive communication deficit: Secondary | ICD-10-CM | POA: Diagnosis not present

## 2022-07-15 DIAGNOSIS — M1990 Unspecified osteoarthritis, unspecified site: Secondary | ICD-10-CM | POA: Diagnosis not present

## 2022-07-15 DIAGNOSIS — G459 Transient cerebral ischemic attack, unspecified: Secondary | ICD-10-CM | POA: Diagnosis not present

## 2022-07-15 DIAGNOSIS — R278 Other lack of coordination: Secondary | ICD-10-CM | POA: Diagnosis not present

## 2022-07-15 DIAGNOSIS — R279 Unspecified lack of coordination: Secondary | ICD-10-CM | POA: Diagnosis not present

## 2022-07-15 DIAGNOSIS — Z8589 Personal history of malignant neoplasm of other organs and systems: Secondary | ICD-10-CM | POA: Diagnosis not present

## 2022-07-15 DIAGNOSIS — Z8701 Personal history of pneumonia (recurrent): Secondary | ICD-10-CM | POA: Diagnosis not present

## 2022-07-15 DIAGNOSIS — F319 Bipolar disorder, unspecified: Secondary | ICD-10-CM | POA: Diagnosis not present

## 2022-07-15 DIAGNOSIS — K219 Gastro-esophageal reflux disease without esophagitis: Secondary | ICD-10-CM | POA: Diagnosis not present

## 2022-07-15 DIAGNOSIS — Z79899 Other long term (current) drug therapy: Secondary | ICD-10-CM | POA: Diagnosis not present

## 2022-07-15 DIAGNOSIS — I951 Orthostatic hypotension: Secondary | ICD-10-CM | POA: Diagnosis not present

## 2022-07-15 DIAGNOSIS — I4891 Unspecified atrial fibrillation: Secondary | ICD-10-CM | POA: Diagnosis not present

## 2022-07-15 DIAGNOSIS — I10 Essential (primary) hypertension: Secondary | ICD-10-CM | POA: Diagnosis not present

## 2022-07-15 DIAGNOSIS — R918 Other nonspecific abnormal finding of lung field: Secondary | ICD-10-CM | POA: Diagnosis not present

## 2022-07-15 DIAGNOSIS — Z8546 Personal history of malignant neoplasm of prostate: Secondary | ICD-10-CM | POA: Diagnosis not present

## 2022-07-15 DIAGNOSIS — F03A11 Unspecified dementia, mild, with agitation: Secondary | ICD-10-CM | POA: Diagnosis not present

## 2022-07-15 DIAGNOSIS — Z87891 Personal history of nicotine dependence: Secondary | ICD-10-CM | POA: Diagnosis not present

## 2022-07-15 DIAGNOSIS — I4510 Unspecified right bundle-branch block: Secondary | ICD-10-CM | POA: Diagnosis not present

## 2022-07-15 DIAGNOSIS — Z888 Allergy status to other drugs, medicaments and biological substances status: Secondary | ICD-10-CM | POA: Diagnosis not present

## 2022-07-15 DIAGNOSIS — R270 Ataxia, unspecified: Secondary | ICD-10-CM | POA: Diagnosis not present

## 2022-07-15 DIAGNOSIS — Z9981 Dependence on supplemental oxygen: Secondary | ICD-10-CM | POA: Diagnosis not present

## 2022-07-15 DIAGNOSIS — Z1152 Encounter for screening for COVID-19: Secondary | ICD-10-CM | POA: Diagnosis not present

## 2022-07-15 DIAGNOSIS — Z7901 Long term (current) use of anticoagulants: Secondary | ICD-10-CM | POA: Diagnosis not present

## 2022-07-15 DIAGNOSIS — F419 Anxiety disorder, unspecified: Secondary | ICD-10-CM | POA: Diagnosis not present

## 2022-07-15 DIAGNOSIS — E7800 Pure hypercholesterolemia, unspecified: Secondary | ICD-10-CM | POA: Diagnosis not present

## 2022-07-15 DIAGNOSIS — I6782 Cerebral ischemia: Secondary | ICD-10-CM | POA: Diagnosis not present

## 2022-07-15 DIAGNOSIS — G9341 Metabolic encephalopathy: Secondary | ICD-10-CM | POA: Diagnosis not present

## 2022-07-15 DIAGNOSIS — R531 Weakness: Secondary | ICD-10-CM | POA: Diagnosis not present

## 2022-07-15 DIAGNOSIS — Z7982 Long term (current) use of aspirin: Secondary | ICD-10-CM | POA: Diagnosis not present

## 2022-07-15 DIAGNOSIS — R2681 Unsteadiness on feet: Secondary | ICD-10-CM | POA: Diagnosis not present

## 2022-07-15 DIAGNOSIS — R296 Repeated falls: Secondary | ICD-10-CM | POA: Diagnosis not present

## 2022-07-15 DIAGNOSIS — J690 Pneumonitis due to inhalation of food and vomit: Secondary | ICD-10-CM | POA: Diagnosis not present

## 2022-07-15 DIAGNOSIS — Z043 Encounter for examination and observation following other accident: Secondary | ICD-10-CM | POA: Diagnosis not present

## 2022-07-15 DIAGNOSIS — Z7401 Bed confinement status: Secondary | ICD-10-CM | POA: Diagnosis not present

## 2022-07-15 DIAGNOSIS — R5383 Other fatigue: Secondary | ICD-10-CM | POA: Diagnosis not present

## 2022-07-15 DIAGNOSIS — J9610 Chronic respiratory failure, unspecified whether with hypoxia or hypercapnia: Secondary | ICD-10-CM | POA: Diagnosis not present

## 2022-07-15 DIAGNOSIS — E039 Hypothyroidism, unspecified: Secondary | ICD-10-CM | POA: Diagnosis not present

## 2022-07-16 ENCOUNTER — Telehealth: Payer: Self-pay

## 2022-07-16 NOTE — Telephone Encounter (Signed)
Discussed Watchman with patient's wife at great length. She understands that due to the patient's frailty and the fact he is not on an a/c makes him no candidate for Watchman.  She wished to keep 2/19 appointment with Dr. Geraldo Pitter for post-hospital visit and wished to cancel the follow-up with Dr. Quentin Ore.  She will speak with Dr. Geraldo Pitter about Watchman again at that visit. She was grateful for call.  Procedure cancelled.

## 2022-07-16 NOTE — Telephone Encounter (Signed)
-----   Message from Vickie Epley, MD sent at 07/11/2022  5:24 PM EST ----- Regarding: RE: LAAO scheduled for 3/21 Agree. Can cancel any further workup for Watchman. CL  ----- Message ----- From: Tommie Raymond, NP Sent: 07/11/2022   1:37 PM EST To: Theodoro Parma, RN; Vickie Epley, MD Subject: LAAO scheduled for 3/21                        Hey guys- I believe I have already sent a message about this man. I spoke to his wife today and she states that he continues to be severely confused, multiple falls since hospital discharge 1/28, and is very weak. Like Dr. Stanford Breed stated in his progress notes, he does not appear to be a Watchman candidate right now. His Coumadin was discontinued due to the falls. He sees his PCP today and I have scheduled an appointment with Dr. Julianne Rice for 2/19. He will see you Dr. Quentin Ore 2/21.   Thanks  [employer]

## 2022-07-18 DIAGNOSIS — I4891 Unspecified atrial fibrillation: Secondary | ICD-10-CM | POA: Diagnosis not present

## 2022-07-18 DIAGNOSIS — G9341 Metabolic encephalopathy: Secondary | ICD-10-CM | POA: Diagnosis not present

## 2022-07-18 DIAGNOSIS — J690 Pneumonitis due to inhalation of food and vomit: Secondary | ICD-10-CM | POA: Diagnosis not present

## 2022-07-23 DIAGNOSIS — I951 Orthostatic hypotension: Secondary | ICD-10-CM | POA: Diagnosis not present

## 2022-07-23 DIAGNOSIS — J9610 Chronic respiratory failure, unspecified whether with hypoxia or hypercapnia: Secondary | ICD-10-CM | POA: Diagnosis not present

## 2022-07-23 DIAGNOSIS — F03911 Unspecified dementia, unspecified severity, with agitation: Secondary | ICD-10-CM | POA: Diagnosis not present

## 2022-07-23 DIAGNOSIS — R5381 Other malaise: Secondary | ICD-10-CM | POA: Diagnosis not present

## 2022-07-23 DIAGNOSIS — I10 Essential (primary) hypertension: Secondary | ICD-10-CM | POA: Diagnosis not present

## 2022-07-23 DIAGNOSIS — R531 Weakness: Secondary | ICD-10-CM | POA: Diagnosis not present

## 2022-07-23 DIAGNOSIS — G9341 Metabolic encephalopathy: Secondary | ICD-10-CM | POA: Diagnosis not present

## 2022-07-23 DIAGNOSIS — R278 Other lack of coordination: Secondary | ICD-10-CM | POA: Diagnosis not present

## 2022-07-23 DIAGNOSIS — R41841 Cognitive communication deficit: Secondary | ICD-10-CM | POA: Diagnosis not present

## 2022-07-23 DIAGNOSIS — R279 Unspecified lack of coordination: Secondary | ICD-10-CM | POA: Diagnosis not present

## 2022-07-23 DIAGNOSIS — R2681 Unsteadiness on feet: Secondary | ICD-10-CM | POA: Diagnosis not present

## 2022-07-23 DIAGNOSIS — Z7401 Bed confinement status: Secondary | ICD-10-CM | POA: Diagnosis not present

## 2022-07-24 DIAGNOSIS — J9610 Chronic respiratory failure, unspecified whether with hypoxia or hypercapnia: Secondary | ICD-10-CM | POA: Diagnosis not present

## 2022-07-24 DIAGNOSIS — R5381 Other malaise: Secondary | ICD-10-CM | POA: Diagnosis not present

## 2022-07-24 DIAGNOSIS — I10 Essential (primary) hypertension: Secondary | ICD-10-CM | POA: Diagnosis not present

## 2022-07-24 DIAGNOSIS — F03911 Unspecified dementia, unspecified severity, with agitation: Secondary | ICD-10-CM | POA: Diagnosis not present

## 2022-07-27 ENCOUNTER — Other Ambulatory Visit: Payer: Self-pay

## 2022-07-27 ENCOUNTER — Encounter (HOSPITAL_COMMUNITY): Payer: Self-pay

## 2022-07-27 ENCOUNTER — Inpatient Hospital Stay (HOSPITAL_COMMUNITY)
Admission: AD | Admit: 2022-07-27 | Discharge: 2022-08-02 | DRG: 069 | Disposition: A | Discharge status: Other Acute Inpatient Hospital | Payer: Medicare Other | Source: Other Acute Inpatient Hospital | Attending: Neurology | Admitting: Neurology

## 2022-07-27 ENCOUNTER — Inpatient Hospital Stay (HOSPITAL_COMMUNITY): Payer: Medicare Other

## 2022-07-27 ENCOUNTER — Other Ambulatory Visit: Payer: Self-pay | Admitting: Internal Medicine

## 2022-07-27 DIAGNOSIS — I252 Old myocardial infarction: Secondary | ICD-10-CM

## 2022-07-27 DIAGNOSIS — Z8546 Personal history of malignant neoplasm of prostate: Secondary | ICD-10-CM

## 2022-07-27 DIAGNOSIS — R29898 Other symptoms and signs involving the musculoskeletal system: Secondary | ICD-10-CM | POA: Diagnosis not present

## 2022-07-27 DIAGNOSIS — G8191 Hemiplegia, unspecified affecting right dominant side: Secondary | ICD-10-CM | POA: Diagnosis present

## 2022-07-27 DIAGNOSIS — Z79899 Other long term (current) drug therapy: Secondary | ICD-10-CM | POA: Diagnosis not present

## 2022-07-27 DIAGNOSIS — S2232XA Fracture of one rib, left side, initial encounter for closed fracture: Secondary | ICD-10-CM | POA: Diagnosis not present

## 2022-07-27 DIAGNOSIS — K922 Gastrointestinal hemorrhage, unspecified: Secondary | ICD-10-CM | POA: Diagnosis not present

## 2022-07-27 DIAGNOSIS — F410 Panic disorder [episodic paroxysmal anxiety] without agoraphobia: Secondary | ICD-10-CM | POA: Diagnosis not present

## 2022-07-27 DIAGNOSIS — I517 Cardiomegaly: Secondary | ICD-10-CM | POA: Diagnosis not present

## 2022-07-27 DIAGNOSIS — Z01810 Encounter for preprocedural cardiovascular examination: Secondary | ICD-10-CM | POA: Diagnosis not present

## 2022-07-27 DIAGNOSIS — I129 Hypertensive chronic kidney disease with stage 1 through stage 4 chronic kidney disease, or unspecified chronic kidney disease: Secondary | ICD-10-CM | POA: Diagnosis present

## 2022-07-27 DIAGNOSIS — R45850 Homicidal ideations: Secondary | ICD-10-CM | POA: Diagnosis not present

## 2022-07-27 DIAGNOSIS — E7800 Pure hypercholesterolemia, unspecified: Secondary | ICD-10-CM | POA: Diagnosis present

## 2022-07-27 DIAGNOSIS — R451 Restlessness and agitation: Secondary | ICD-10-CM | POA: Diagnosis not present

## 2022-07-27 DIAGNOSIS — I700 Atherosclerosis of aorta: Secondary | ICD-10-CM | POA: Diagnosis present

## 2022-07-27 DIAGNOSIS — Z8719 Personal history of other diseases of the digestive system: Secondary | ICD-10-CM

## 2022-07-27 DIAGNOSIS — N3944 Nocturnal enuresis: Secondary | ICD-10-CM | POA: Diagnosis not present

## 2022-07-27 DIAGNOSIS — F319 Bipolar disorder, unspecified: Secondary | ICD-10-CM | POA: Diagnosis not present

## 2022-07-27 DIAGNOSIS — I2510 Atherosclerotic heart disease of native coronary artery without angina pectoris: Secondary | ICD-10-CM | POA: Diagnosis present

## 2022-07-27 DIAGNOSIS — I10 Essential (primary) hypertension: Secondary | ICD-10-CM | POA: Diagnosis not present

## 2022-07-27 DIAGNOSIS — F0390 Unspecified dementia without behavioral disturbance: Secondary | ICD-10-CM | POA: Diagnosis not present

## 2022-07-27 DIAGNOSIS — Z85818 Personal history of malignant neoplasm of other sites of lip, oral cavity, and pharynx: Secondary | ICD-10-CM | POA: Diagnosis not present

## 2022-07-27 DIAGNOSIS — Z87891 Personal history of nicotine dependence: Secondary | ICD-10-CM | POA: Diagnosis not present

## 2022-07-27 DIAGNOSIS — N1832 Chronic kidney disease, stage 3b: Secondary | ICD-10-CM | POA: Diagnosis present

## 2022-07-27 DIAGNOSIS — Z8673 Personal history of transient ischemic attack (TIA), and cerebral infarction without residual deficits: Secondary | ICD-10-CM | POA: Diagnosis not present

## 2022-07-27 DIAGNOSIS — I4892 Unspecified atrial flutter: Secondary | ICD-10-CM | POA: Diagnosis present

## 2022-07-27 DIAGNOSIS — R471 Dysarthria and anarthria: Secondary | ICD-10-CM | POA: Diagnosis present

## 2022-07-27 DIAGNOSIS — E039 Hypothyroidism, unspecified: Secondary | ICD-10-CM | POA: Diagnosis present

## 2022-07-27 DIAGNOSIS — Z1152 Encounter for screening for COVID-19: Secondary | ICD-10-CM

## 2022-07-27 DIAGNOSIS — R4781 Slurred speech: Secondary | ICD-10-CM | POA: Diagnosis not present

## 2022-07-27 DIAGNOSIS — I639 Cerebral infarction, unspecified: Secondary | ICD-10-CM | POA: Diagnosis not present

## 2022-07-27 DIAGNOSIS — I482 Chronic atrial fibrillation, unspecified: Secondary | ICD-10-CM | POA: Diagnosis present

## 2022-07-27 DIAGNOSIS — N200 Calculus of kidney: Secondary | ICD-10-CM | POA: Diagnosis not present

## 2022-07-27 DIAGNOSIS — Z800 Family history of malignant neoplasm of digestive organs: Secondary | ICD-10-CM

## 2022-07-27 DIAGNOSIS — Z931 Gastrostomy status: Secondary | ICD-10-CM | POA: Diagnosis not present

## 2022-07-27 DIAGNOSIS — R4701 Aphasia: Secondary | ICD-10-CM | POA: Diagnosis present

## 2022-07-27 DIAGNOSIS — Z8701 Personal history of pneumonia (recurrent): Secondary | ICD-10-CM

## 2022-07-27 DIAGNOSIS — K429 Umbilical hernia without obstruction or gangrene: Secondary | ICD-10-CM | POA: Diagnosis not present

## 2022-07-27 DIAGNOSIS — I6501 Occlusion and stenosis of right vertebral artery: Secondary | ICD-10-CM | POA: Diagnosis not present

## 2022-07-27 DIAGNOSIS — G459 Transient cerebral ischemic attack, unspecified: Principal | ICD-10-CM

## 2022-07-27 DIAGNOSIS — Z9282 Status post administration of tPA (rtPA) in a different facility within the last 24 hours prior to admission to current facility: Secondary | ICD-10-CM | POA: Diagnosis not present

## 2022-07-27 DIAGNOSIS — R296 Repeated falls: Secondary | ICD-10-CM | POA: Diagnosis present

## 2022-07-27 DIAGNOSIS — K5730 Diverticulosis of large intestine without perforation or abscess without bleeding: Secondary | ICD-10-CM | POA: Diagnosis not present

## 2022-07-27 DIAGNOSIS — I6523 Occlusion and stenosis of bilateral carotid arteries: Secondary | ICD-10-CM | POA: Diagnosis not present

## 2022-07-27 DIAGNOSIS — Z923 Personal history of irradiation: Secondary | ICD-10-CM

## 2022-07-27 DIAGNOSIS — R45851 Suicidal ideations: Secondary | ICD-10-CM | POA: Diagnosis present

## 2022-07-27 DIAGNOSIS — F0393 Unspecified dementia, unspecified severity, with mood disturbance: Secondary | ICD-10-CM | POA: Diagnosis present

## 2022-07-27 DIAGNOSIS — C61 Malignant neoplasm of prostate: Secondary | ICD-10-CM | POA: Diagnosis not present

## 2022-07-27 DIAGNOSIS — F314 Bipolar disorder, current episode depressed, severe, without psychotic features: Secondary | ICD-10-CM | POA: Diagnosis present

## 2022-07-27 DIAGNOSIS — R531 Weakness: Secondary | ICD-10-CM | POA: Diagnosis not present

## 2022-07-27 DIAGNOSIS — Z9221 Personal history of antineoplastic chemotherapy: Secondary | ICD-10-CM

## 2022-07-27 DIAGNOSIS — N3281 Overactive bladder: Secondary | ICD-10-CM | POA: Diagnosis not present

## 2022-07-27 DIAGNOSIS — R9431 Abnormal electrocardiogram [ECG] [EKG]: Secondary | ICD-10-CM | POA: Diagnosis not present

## 2022-07-27 DIAGNOSIS — E785 Hyperlipidemia, unspecified: Secondary | ICD-10-CM | POA: Diagnosis not present

## 2022-07-27 DIAGNOSIS — I480 Paroxysmal atrial fibrillation: Secondary | ICD-10-CM | POA: Diagnosis not present

## 2022-07-27 DIAGNOSIS — Z8249 Family history of ischemic heart disease and other diseases of the circulatory system: Secondary | ICD-10-CM

## 2022-07-27 DIAGNOSIS — Z8042 Family history of malignant neoplasm of prostate: Secondary | ICD-10-CM

## 2022-07-27 DIAGNOSIS — I4891 Unspecified atrial fibrillation: Secondary | ICD-10-CM | POA: Diagnosis not present

## 2022-07-27 DIAGNOSIS — Z7982 Long term (current) use of aspirin: Secondary | ICD-10-CM

## 2022-07-27 DIAGNOSIS — K8689 Other specified diseases of pancreas: Secondary | ICD-10-CM | POA: Diagnosis not present

## 2022-07-27 DIAGNOSIS — R918 Other nonspecific abnormal finding of lung field: Secondary | ICD-10-CM | POA: Diagnosis not present

## 2022-07-27 DIAGNOSIS — J9601 Acute respiratory failure with hypoxia: Secondary | ICD-10-CM | POA: Diagnosis not present

## 2022-07-27 DIAGNOSIS — Z7901 Long term (current) use of anticoagulants: Secondary | ICD-10-CM | POA: Diagnosis not present

## 2022-07-27 DIAGNOSIS — Z79890 Hormone replacement therapy (postmenopausal): Secondary | ICD-10-CM | POA: Diagnosis not present

## 2022-07-27 HISTORY — DX: Cerebral infarction, unspecified: I63.9

## 2022-07-27 LAB — MRSA NEXT GEN BY PCR, NASAL: MRSA by PCR Next Gen: NOT DETECTED

## 2022-07-27 LAB — HEMOGLOBIN A1C
Hgb A1c MFr Bld: 5.6 % (ref 4.8–5.6)
Mean Plasma Glucose: 114.02 mg/dL

## 2022-07-27 LAB — GLUCOSE, CAPILLARY
Glucose-Capillary: 74 mg/dL (ref 70–99)
Glucose-Capillary: 81 mg/dL (ref 70–99)

## 2022-07-27 MED ORDER — ACETAMINOPHEN 325 MG PO TABS
650.0000 mg | ORAL_TABLET | ORAL | Status: DC | PRN
  Administered 2022-07-28 – 2022-08-02 (×5): 650 mg via ORAL
  Filled 2022-07-27 (×5): qty 2

## 2022-07-27 MED ORDER — PANTOPRAZOLE SODIUM 40 MG IV SOLR: 40.0000 mg | Freq: Every day | INTRAVENOUS | Status: DC

## 2022-07-27 MED ORDER — LAMOTRIGINE 100 MG PO TABS
100.0000 mg | ORAL_TABLET | Freq: Every day | ORAL | Status: DC
  Administered 2022-07-28 – 2022-08-01 (×5): 100 mg via ORAL
  Filled 2022-07-27 (×5): qty 1

## 2022-07-27 MED ORDER — QUETIAPINE FUMARATE ER 200 MG PO TB24
200.0000 mg | ORAL_TABLET | Freq: Every day | ORAL | Status: DC
  Administered 2022-07-28 – 2022-08-01 (×5): 200 mg via ORAL
  Filled 2022-07-27 (×7): qty 1

## 2022-07-27 MED ORDER — LEVOTHYROXINE SODIUM 25 MCG PO TABS
125.0000 ug | ORAL_TABLET | Freq: Every day | ORAL | Status: DC
  Administered 2022-07-29 – 2022-08-02 (×5): 125 ug via ORAL
  Filled 2022-07-27 (×5): qty 1

## 2022-07-27 MED ORDER — ACETAMINOPHEN 160 MG/5ML PO SOLN: 650.0000 mg | ORAL | Status: DC | PRN

## 2022-07-27 MED ORDER — STROKE: EARLY STAGES OF RECOVERY BOOK
Freq: Once | Status: AC
  Filled 2022-07-27: qty 1

## 2022-07-27 MED ORDER — ROSUVASTATIN CALCIUM 5 MG PO TABS
10.0000 mg | ORAL_TABLET | Freq: Every evening | ORAL | Status: DC
  Administered 2022-07-28 – 2022-08-01 (×5): 10 mg via ORAL
  Filled 2022-07-27 (×2): qty 2
  Filled 2022-07-27: qty 1
  Filled 2022-07-27: qty 2
  Filled 2022-07-27: qty 1

## 2022-07-27 MED ORDER — SODIUM CHLORIDE 0.9 % IV SOLN: INTRAVENOUS | Status: DC

## 2022-07-27 MED ORDER — AMIODARONE HCL 200 MG PO TABS
200.0000 mg | ORAL_TABLET | Freq: Every day | ORAL | Status: DC
  Administered 2022-07-28 – 2022-08-02 (×6): 200 mg via ORAL
  Filled 2022-07-27 (×6): qty 1

## 2022-07-27 MED ORDER — ACETAMINOPHEN 650 MG RE SUPP: 650.0000 mg | RECTAL | Status: DC | PRN
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
## 2022-07-27 NOTE — Progress Notes (Addendum)
[name] [id number] Admission Data: 07/27/2022 6:05 PM Attending Provider: Greta Doom, *  [id number], Gara Kroner, MD Consults/ Treatment Team:   Domenic Moras [name] is a 82 y.o. male patient admitted from ED awake, alert  & orientated  X 3,  Full Code, VSS - Blood pressure (!) 163/91, pulse 80, temperature (!) 96 F (35.6 C), temperature source Axillary, resp. rate (!) 22, SpO2 98 %., O2 cannular, no c/o shortness of breath, no c/o chest pain, no distress noted. Tele # [id number]   IV site WDL:  forearm right, condition patent and no redness and left, condition patent and no redness with a transparent dsg that's clean dry and intact.  Skin, clean-dry- intact without evidence of bruising, or skin tears.   No evidence of skin break down noted on exam. Pt admitted to [facility] room 11.     Will cont to monitor and assist as needed.  Hosie Spangle, [place] 07/27/2022 6:05 PM

## 2022-07-28 ENCOUNTER — Inpatient Hospital Stay (HOSPITAL_COMMUNITY): Payer: Medicare Other

## 2022-07-28 DIAGNOSIS — Z9282 Status post administration of tPA (rtPA) in a different facility within the last 24 hours prior to admission to current facility: Secondary | ICD-10-CM

## 2022-07-28 DIAGNOSIS — I639 Cerebral infarction, unspecified: Secondary | ICD-10-CM | POA: Diagnosis not present

## 2022-07-28 DIAGNOSIS — I482 Chronic atrial fibrillation, unspecified: Secondary | ICD-10-CM | POA: Diagnosis not present

## 2022-07-28 DIAGNOSIS — G459 Transient cerebral ischemic attack, unspecified: Secondary | ICD-10-CM | POA: Diagnosis not present

## 2022-07-28 LAB — LIPID PANEL
Cholesterol: 104 mg/dL (ref 0–200)
HDL: 38 mg/dL — ABNORMAL LOW
LDL Cholesterol: 40 mg/dL (ref 0–99)
Total CHOL/HDL Ratio: 2.7 ratio
Triglycerides: 128 mg/dL
VLDL: 26 mg/dL (ref 0–40)

## 2022-07-28 LAB — BASIC METABOLIC PANEL
Anion gap: 6 (ref 5–15)
BUN: 13 mg/dL (ref 8–23)
CO2: 25 mmol/L (ref 22–32)
Calcium: 8.3 mg/dL — ABNORMAL LOW (ref 8.9–10.3)
Chloride: 110 mmol/L (ref 98–111)
Creatinine, Ser: 1.58 mg/dL — ABNORMAL HIGH (ref 0.61–1.24)
GFR, Estimated: 44 mL/min — ABNORMAL LOW (ref >60)
Glucose, Bld: 88 mg/dL (ref 70–99)
Potassium: 3.9 mmol/L (ref 3.5–5.1)
Sodium: 141 mmol/L (ref 135–145)

## 2022-07-28 LAB — CBC
HCT: 37.2 % — ABNORMAL LOW (ref 39.0–52.0)
Hemoglobin: 12.1 g/dL — ABNORMAL LOW (ref 13.0–17.0)
MCH: 28.8 pg (ref 26.0–34.0)
MCHC: 32.5 g/dL (ref 30.0–36.0)
MCV: 88.6 fL (ref 80.0–100.0)
Platelets: 195 K/uL (ref 150–400)
RBC: 4.2 MIL/uL — ABNORMAL LOW (ref 4.22–5.81)
RDW: 15.9 % — ABNORMAL HIGH (ref 11.5–15.5)
WBC: 4.4 K/uL (ref 4.0–10.5)
nRBC: 0 % (ref 0.0–0.2)

## 2022-07-28 LAB — GLUCOSE, CAPILLARY
Glucose-Capillary: 106 mg/dL — ABNORMAL HIGH (ref 70–99)
Glucose-Capillary: 70 mg/dL (ref 70–99)
Glucose-Capillary: 84 mg/dL (ref 70–99)
Glucose-Capillary: 86 mg/dL (ref 70–99)
Glucose-Capillary: 92 mg/dL (ref 70–99)
Glucose-Capillary: 97 mg/dL (ref 70–99)

## 2022-07-28 MED ORDER — PANTOPRAZOLE SODIUM 40 MG PO TBEC
40.0000 mg | DELAYED_RELEASE_TABLET | Freq: Every day | ORAL | Status: DC
  Administered 2022-07-29 – 2022-08-02 (×5): 40 mg via ORAL
  Filled 2022-07-28 (×5): qty 1

## 2022-07-28 MED ORDER — VITAMIN D 25 MCG (1000 UNIT) PO TABS
5000.0000 [IU] | ORAL_TABLET | Freq: Every day | ORAL | Status: DC
  Administered 2022-07-29 – 2022-07-30 (×2): 5000 [IU] via ORAL
  Filled 2022-07-28 (×3): qty 5

## 2022-07-28 MED ORDER — FAMOTIDINE 20 MG PO TABS
20.0000 mg | ORAL_TABLET | Freq: Every day | ORAL | Status: DC
  Administered 2022-07-28 – 2022-08-02 (×6): 20 mg via ORAL
  Filled 2022-07-28 (×6): qty 1

## 2022-07-28 MED ORDER — METOPROLOL TARTRATE 50 MG PO TABS
50.0000 mg | ORAL_TABLET | Freq: Two times a day (BID) | ORAL | Status: DC
  Administered 2022-07-28 – 2022-08-02 (×11): 50 mg via ORAL
  Filled 2022-07-28 (×11): qty 1

## 2022-07-28 MED ORDER — POLYETHYLENE GLYCOL 3350 17 G PO PACK
17.0000 g | PACK | Freq: Every day | ORAL | Status: DC | PRN
  Filled 2022-07-28: qty 1

## 2022-07-28 MED ORDER — ASPIRIN 81 MG PO CHEW
81.0000 mg | CHEWABLE_TABLET | Freq: Every day | ORAL | Status: DC
  Administered 2022-07-28 – 2022-07-30 (×3): 81 mg via ORAL
  Filled 2022-07-28 (×3): qty 1

## 2022-07-28 NOTE — Evaluation (Signed)
Physical Therapy Evaluation Patient Details Name: [name] MRN: [id number] DOB: 06-06-1941 Today's Date: 07/28/2022  History of Present Illness  82 y.o. M admitted on 07/27/22 from [facility] following an acute onset of R sided weakness, slurred speech, and confusion. Pt received TPN at 1012a on 07/27/22. CT and MRI are negative. PMH includes: dementia, HTN, afib on warfarin, bipolar disorder, cataracts, CAD, MI, throat cancer s/p chemo and radiation.  Clinical Impression  Patient presents with mobility at min A level with RW.  Previously living with his wife until recent rehab stay, then states he was with his brother when he began having symptoms.  Patient relates his wife will assist him, however, if she is unable he may do well in memory care or ALF setting.  PT will continue to follow in acute setting.  Recommend HHPT at d/c.        Recommendations for follow up therapy are one component of a multi-disciplinary discharge planning process, led by the attending physician.  Recommendations may be updated based on patient status, additional functional criteria and insurance authorization.  Follow Up Recommendations Home health PT      Assistance Recommended at Discharge Frequent or constant Supervision/Assistance  Patient can return home with the following  A little help with walking and/or transfers;Assist for transportation;Help with stairs or ramp for entrance;A little help with bathing/dressing/bathroom    Equipment Recommendations None recommended by PT  Recommendations for Other Services       Functional Status Assessment Patient has had a recent decline in their functional status and demonstrates the ability to make significant improvements in function in a reasonable and predictable amount of time.     Precautions / Restrictions Precautions Precautions: Fall Precaution Comments: reports hx of fall involving a tractor he was working on where he broke both arms  last year Restrictions Weight Bearing Restrictions: No      Mobility  Bed Mobility Overal bed mobility: Needs Assistance Bed Mobility: Supine to Sit     Supine to sit: Min assist     General bed mobility comments: Min A to pull up and scoot to EOB    Transfers Overall transfer level: Needs assistance Equipment used: Rolling walker (2 wheels) Transfers: Sit to/from Stand             General transfer comment: Min A for steadying    Ambulation/Gait Ambulation/Gait assistance: Min assist, +2 safety/equipment Gait Distance (Feet): 120 Feet Assistive device: Rolling walker (2 wheels) Gait Pattern/deviations: Step-through pattern, Decreased stride length       General Gait Details: using walker well and able to walk and talk, noted VSS; +2 for lines  Stairs            Wheelchair Mobility    Modified Rankin (Stroke Patients Only)       Balance Overall balance assessment: Needs assistance Sitting-balance support: Feet supported Sitting balance-Leahy Scale: Good     Standing balance support: Bilateral upper extremity supported, During functional activity, No upper extremity supported Standing balance-Leahy Scale: Fair Standing balance comment: Able to bend down and pick up trash from the floor without losing balance                             Pertinent Vitals/Pain Pain Assessment Pain Score: 8  Pain Location: Both LE Pain Descriptors / Indicators: Aching, Discomfort Pain Intervention(s): Monitored during session, Repositioned    Home Living Family/patient expects to be  discharged to:: Private residence Living Arrangements: Spouse/significant other Available Help at Discharge: Family Type of Home: House Home Access: Stairs to enter Entrance Stairs-Rails: None Entrance Stairs-Number of Steps: 2   Home Layout: One level Home Equipment: Grab bars - tub/shower;Hand held shower head;Shower Land (2 wheels);Rollator (4  wheels)      Prior Function Prior Level of Function : Patient poor historian/Family not available             Mobility Comments: independent mobility ADLs Comments: reports independent     Hand Dominance   Dominant Hand: Right    Extremity/Trunk Assessment   Upper Extremity Assessment Upper Extremity Assessment: Defer to OT evaluation    Lower Extremity Assessment Lower Extremity Assessment: Generalized weakness    Cervical / Trunk Assessment Cervical / Trunk Assessment: Normal  Communication   Communication: No difficulties  Cognition Arousal/Alertness: Awake/alert Behavior During Therapy: WFL for tasks assessed/performed Overall Cognitive Status: History of cognitive impairments - at baseline                                 General Comments: Pt with diagnosis of Dementia; oriented x 4 except day of the week        General Comments General comments (skin integrity, edema, etc.): VSS on RA    Exercises     Assessment/Plan    PT Assessment Patient needs continued PT services  PT Problem List Decreased balance;Decreased knowledge of precautions;Decreased knowledge of use of DME;Decreased mobility;Decreased safety awareness       PT Treatment Interventions DME instruction;Functional mobility training;Balance training;Patient/family education;Therapeutic activities;Gait training;Therapeutic exercise;Neuromuscular re-education    PT Goals (Current goals can be found in the Care Plan section)  Acute Rehab PT Goals Patient Stated Goal: none stated PT Goal Formulation: Patient unable to participate in goal setting Time For Goal Achievement: 08/11/22 Potential to Achieve Goals: Good    Frequency Min 3X/week     Co-evaluation PT/OT/SLP Co-Evaluation/Treatment: Yes Reason for Co-Treatment: To address functional/ADL transfers PT goals addressed during session: Mobility/safety with mobility OT goals addressed during session: ADL's and  self-care;Strengthening/ROM       AM-PAC PT "6 Clicks" Mobility  Outcome Measure Help needed turning from your back to your side while in a flat bed without using bedrails?: A Little Help needed moving from lying on your back to sitting on the side of a flat bed without using bedrails?: A Little Help needed moving to and from a bed to a chair (including a wheelchair)?: A Little Help needed standing up from a chair using your arms (e.g., wheelchair or bedside chair)?: A Little Help needed to walk in hospital room?: A Little Help needed climbing 3-5 steps with a railing? : Total 6 Click Score: 16    End of Session Equipment Utilized During Treatment: Gait belt Activity Tolerance: Patient tolerated treatment well Patient left: in chair;with chair alarm set;with call bell/phone within reach   PT Visit Diagnosis: Muscle weakness (generalized) (M62.81);Other abnormalities of gait and mobility (R26.89)    Time: [id number] PT Time Calculation (min) (ACUTE ONLY): 24 min   Charges:   PT Evaluation $PT Eval Moderate Complexity: 1 Mod          Cyndi Aleida Crandell, PT Acute Rehabilitation Services [phone] 07/28/2022   Reginia Naas 07/28/2022, 5:29 PM

## 2022-07-28 NOTE — Progress Notes (Signed)
PT Cancellation Note  Patient Details Name: [name] MRN: [id number] DOB: [birth year]   Cancelled Treatment:    Reason Eval/Treat Not Completed: Active bedrest order; will follow up when activity orders upgraded.    Reginia Naas 07/28/2022, 11:45 AM Magda Kiel, PT Acute Rehabilitation Services [phone] 07/28/2022

## 2022-07-28 NOTE — Evaluation (Signed)
Occupational Therapy Evaluation Patient Details Name: [name] MRN: [id number] DOB: 1940-07-14 Today's Date: 07/28/2022   History of Present Illness 82 y.o. M admitted on 07/27/22 from [facility] following an acute onset of R sided weakness, slurred speech, and confusion. Pt received TPN at [facility] on 07/27/22. CT and MRI are negative. PMH includes: dementia, HTN, afib on warfarin, bipolar disorder, cataracts, CAD, MI, throat cancer s/p chemo and radiation.   Clinical Impression   Pt admitted for concerns listed above. PTA pt reported that he was fairly independent with all ADL's and IADL's, however recently required rehab due to weakness from prior hospitalization. At this time, pt demonstrates fair to good balance, especially using the RW. With functional mobility and ADL's he is currently at a min guard level, mainly requiring assist due to cognition. Recommending HHOT at this time, and/or ALF placement for safety of patient and his caregivers. OT will continue to follow acutely.       Recommendations for follow up therapy are one component of a multi-disciplinary discharge planning process, led by the attending physician.  Recommendations may be updated based on patient status, additional functional criteria and insurance authorization.   Follow Up Recommendations  Home health OT     Assistance Recommended at Discharge Intermittent Supervision/Assistance  Patient can return home with the following A little help with bathing/dressing/bathroom;Assistance with cooking/housework;Direct supervision/assist for medications management;Help with stairs or ramp for entrance    Functional Status Assessment  Patient has had a recent decline in their functional status and demonstrates the ability to make significant improvements in function in a reasonable and predictable amount of time.  Equipment Recommendations  None recommended by OT    Recommendations for Other Services        Precautions / Restrictions Precautions Precautions: Fall Restrictions Weight Bearing Restrictions: No      Mobility Bed Mobility Overal bed mobility: Needs Assistance Bed Mobility: Supine to Sit     Supine to sit: Min assist     General bed mobility comments: Min A to pull up and scoot to EOB    Transfers Overall transfer level: Needs assistance Equipment used: Rolling walker (2 wheels) Transfers: Sit to/from Stand Sit to Stand: Min assist           General transfer comment: Min A for steadying      Balance Overall balance assessment: Needs assistance Sitting-balance support: Feet supported Sitting balance-Leahy Scale: Good     Standing balance support: Bilateral upper extremity supported, During functional activity, No upper extremity supported Standing balance-Leahy Scale: Fair Standing balance comment: Able to bend down and pick up trash from the floor without losing balance                           ADL either performed or assessed with clinical judgement   ADL Overall ADL's : Needs assistance/impaired                                       General ADL Comments: Overall at a min guard level.     Vision Baseline Vision/History: 1 Wears glasses Ability to See in Adequate Light: 0 Adequate Patient Visual Report: No change from baseline Vision Assessment?: No apparent visual deficits     Perception Perception Perception Tested?: No   Praxis Praxis Praxis tested?: Not tested    Pertinent Vitals/Pain Pain  Assessment Pain Assessment: 0-10 Pain Score: 8  Pain Location: Both LE Pain Descriptors / Indicators: Aching, Discomfort Pain Intervention(s): Monitored during session, Repositioned     Hand Dominance Right   Extremity/Trunk Assessment Upper Extremity Assessment Upper Extremity Assessment: Overall WFL for tasks assessed   Lower Extremity Assessment Lower Extremity Assessment: Overall WFL for tasks assessed    Cervical / Trunk Assessment Cervical / Trunk Assessment: Normal   Communication Communication Communication: No difficulties   Cognition Arousal/Alertness: Awake/alert Behavior During Therapy: WFL for tasks assessed/performed Overall Cognitive Status: History of cognitive impairments - at baseline                                 General Comments: Pt withdiagnosis of Dementia     General Comments  VSS on RA    Exercises     Shoulder Instructions      Home Living Family/patient expects to be discharged to:: Private residence Living Arrangements: Spouse/significant other Available Help at Discharge: Family Type of Home: House Home Access: Stairs to enter Technical brewer of Steps: 2 Entrance Stairs-Rails: None Home Layout: One level     Bathroom Shower/Tub: Teacher, early years/pre: Handicapped height     Home Equipment: Grab bars - tub/shower;Hand held shower head;Shower Land (2 wheels);Rollator (4 wheels)          Prior Functioning/Environment Prior Level of Function : Patient poor historian/Family not available             Mobility Comments: independent mobility ADLs Comments: reports independent        OT Problem List: Decreased strength;Decreased activity tolerance;Impaired balance (sitting and/or standing);Decreased safety awareness;Decreased cognition      OT Treatment/Interventions: Self-care/ADL training;Therapeutic exercise;DME and/or AE instruction;Therapeutic activities;Patient/family education;Balance training    OT Goals(Current goals can be found in the care plan section) Acute Rehab OT Goals Patient Stated Goal: To go home OT Goal Formulation: With patient Time For Goal Achievement: 08/11/22 Potential to Achieve Goals: Good ADL Goals Pt Will Perform Grooming: with modified independence;standing Pt Will Perform Lower Body Bathing: with modified independence;sit to/from stand;sitting/lateral  leans Pt Will Perform Lower Body Dressing: with modified independence;sit to/from stand;sitting/lateral leans Pt Will Transfer to Toilet: with modified independence;ambulating Pt Will Perform Toileting - Clothing Manipulation and hygiene: with modified independence;sitting/lateral leans;sit to/from stand  OT Frequency: Min 1X/week    Co-evaluation PT/OT/SLP Co-Evaluation/Treatment: Yes Reason for Co-Treatment: To address functional/ADL transfers   OT goals addressed during session: ADL's and self-care;Strengthening/ROM      AM-PAC OT "6 Clicks" Daily Activity     Outcome Measure Help from another person eating meals?: None Help from another person taking care of personal grooming?: A Little Help from another person toileting, which includes using toliet, bedpan, or urinal?: A Little Help from another person bathing (including washing, rinsing, drying)?: A Little Help from another person to put on and taking off regular upper body clothing?: A Little Help from another person to put on and taking off regular lower body clothing?: A Little 6 Click Score: 19   End of Session Equipment Utilized During Treatment: Gait belt;Rolling walker (2 wheels) Nurse Communication: Mobility status  Activity Tolerance: Patient tolerated treatment well Patient left: in chair;with call bell/phone within reach;with chair alarm set  OT Visit Diagnosis: Unsteadiness on feet (R26.81);Muscle weakness (generalized) (M62.81);Other abnormalities of gait and mobility (R26.89)  Time: [id number] OT Time Calculation (min): 24 min Charges:  OT General Charges $OT Visit: 1 Visit OT Evaluation $OT Eval Moderate Complexity: [place], OTR/L [place] [facility]  Daquan Crapps Elane Yolanda Bonine 07/28/2022, 3:23 PM

## 2022-07-28 NOTE — Progress Notes (Signed)
STROKE TEAM PROGRESS NOTE   SUBJECTIVE (INTERVAL HISTORY) His wife and daughter and granddaughter are at the bedside.  Overall his condition is completely resolved. He is lying in bed, neuro intact, however, still in afib, pending MRI and MRA.    OBJECTIVE Temp:  [97.1 F (36.2 C)-97.7 F (36.5 C)] 97.7 F (36.5 C) (02/17 1947) Pulse Rate:  [64-122] 75 (02/17 2000) Cardiac Rhythm: Atrial fibrillation (02/17 1920) Resp:  [13-25] 20 (02/17 2000) BP: (85-188)/(53-176) 121/81 (02/17 2148) SpO2:  [82 %-100 %] 96 % (02/17 2000)  Recent Labs  Lab 07/28/22 0326 07/28/22 0748 07/28/22 1136 07/28/22 1522 07/28/22 1947  GLUCAP 70 92 86 84 106*   Recent Labs  Lab 07/28/22 0117  NA 141  K 3.9  CL 110  CO2 25  GLUCOSE 88  BUN 13  CREATININE 1.58*  CALCIUM 8.3*   No results for input(s): "AST", "ALT", "ALKPHOS", "BILITOT", "PROT", "ALBUMIN" in the last 168 hours. Recent Labs  Lab 07/28/22 0117  WBC 4.4  HGB 12.1*  HCT 37.2*  MCV 88.6  PLT 195   No results for input(s): "CKTOTAL", "CKMB", "CKMBINDEX", "TROPONINI" in the last 168 hours. No results for input(s): "LABPROT", "INR" in the last 72 hours. No results for input(s): "COLORURINE", "LABSPEC", "PHURINE", "GLUCOSEU", "HGBUR", "BILIRUBINUR", "KETONESUR", "PROTEINUR", "UROBILINOGEN", "NITRITE", "LEUKOCYTESUR" in the last 72 hours.  Invalid input(s): "APPERANCEUR"     Component Value Date/Time   CHOL 104 07/28/2022 0117   CHOL 173 07/25/2021 0923   TRIG 128 07/28/2022 0117   HDL 38 (L) 07/28/2022 0117   HDL 44 07/25/2021 0923   CHOLHDL 2.7 07/28/2022 0117   VLDL 26 07/28/2022 0117   LDLCALC 40 07/28/2022 0117   LDLCALC 86 07/25/2021 0923   Lab Results  Component Value Date   HGBA1C 5.6 07/27/2022   No results found for: "LABOPIA", "COCAINSCRNUR", "LABBENZ", "AMPHETMU", "THCU", "LABBARB"  No results for input(s): "ETH" in the last 168 hours.  I have personally reviewed the radiological images below and agree  with the radiology interpretations.  VAS US CAROTID  Result Date: 07/28/2022 Carotid Arterial Duplex Study Patient Name:  Burness Kehn III  Date of Exam:   07/28/2022 Medical Rec #: [id number]               Accession #:    [id number] Date of Birth: [birth year]                Patient Gender: M Patient Age:   82 years Exam Location:  [facility] Procedure:      VAS US CAROTID Referring Phys: Rosalin Hawking --------------------------------------------------------------------------------  Indications:       CVA. Risk Factors:      Hypertension. Limitations        Today's exam was limited due to the patient's respiratory                    variation and patient positioning. Comparison Study:  No prior studies. Performing Technologist: Oliver Hum RVT  Examination Guidelines: A complete evaluation includes B-mode imaging, spectral Doppler, color Doppler, and power Doppler as needed of all accessible portions of each vessel. Bilateral testing is considered an integral part of a complete examination. Limited examinations for reoccurring indications may be performed as noted.  Right Carotid Findings: +----------+--------+--------+--------+--------------------------+--------+           PSV cm/sEDV cm/sStenosisPlaque Description        Comments +----------+--------+--------+--------+--------------------------+--------+ CCA Prox  73      19  smooth and heterogenous            +----------+--------+--------+--------+--------------------------+--------+ CCA Distal64      19              smooth and heterogenous            +----------+--------+--------+--------+--------------------------+--------+ ICA Prox  93      30              irregular and heterogenous         +----------+--------+--------+--------+--------------------------+--------+ ICA Distal92      30                                                  +----------+--------+--------+--------+--------------------------+--------+ ECA       127     17                                                 +----------+--------+--------+--------+--------------------------+--------+ +----------+--------+-------+--------+-------------------+           PSV cm/sEDV cmsDescribeArm Pressure (mmHG) +----------+--------+-------+--------+-------------------+ [id number]                                         +----------+--------+-------+--------+-------------------+ +---------+--------+--+--------+--+---------+ VertebralPSV cm/s39EDV cm/s11Antegrade +---------+--------+--+--------+--+---------+  Left Carotid Findings: +----------+--------+--------+--------+--------------------------+--------+           PSV cm/sEDV cm/sStenosisPlaque Description        Comments +----------+--------+--------+--------+--------------------------+--------+ CCA Prox  106     15              smooth and heterogenous            +----------+--------+--------+--------+--------------------------+--------+ CCA Distal79      25              irregular and heterogenous         +----------+--------+--------+--------+--------------------------+--------+ ICA Prox  62      15              irregular and heterogenous         +----------+--------+--------+--------+--------------------------+--------+ ICA Distal86      30                                        tortuous +----------+--------+--------+--------+--------------------------+--------+ ECA       106     16                                                 +----------+--------+--------+--------+--------------------------+--------+ +----------+--------+--------+--------+-------------------+           PSV cm/sEDV cm/sDescribeArm Pressure (mmHG) +----------+--------+--------+--------+-------------------+ Subclavian118                                          +----------+--------+--------+--------+-------------------+ +---------+--------+--+--------+--+---------+ VertebralPSV cm/s64EDV cm/s21Antegrade +---------+--------+--+--------+--+---------+   Summary: Right Carotid: Velocities in the right ICA are consistent with a 1-39% stenosis. Left Carotid: Velocities in the  left ICA are consistent with a 1-39% stenosis. Vertebrals: Bilateral vertebral arteries demonstrate antegrade flow. *See table(s) above for measurements and observations.     Preliminary    MR BRAIN WO CONTRAST  Result Date: 07/28/2022 CLINICAL DATA:  Stroke follow-up EXAM: MRI HEAD WITHOUT CONTRAST MRA HEAD WITHOUT CONTRAST TECHNIQUE: Multiplanar, multi-echo pulse sequences of the brain and surrounding structures were acquired without intravenous contrast. Angiographic images of the Circle of Willis were acquired using MRA technique without intravenous contrast. COMPARISON:  07/05/2022 FINDINGS: MRI HEAD FINDINGS Brain: No acute infarction, hemorrhage, hydrocephalus, extra-axial collection or mass lesion. Generalized brain atrophy. Mild chronic small vessel ischemia in the cerebral white matter for age. Vascular: See below Skull and upper cervical spine: Normal marrow signal Sinuses/Orbits: Unremarkable Other: Intermittent significant motion artifact MRA HEAD FINDINGS Anterior circulation: Accounting for artifact the major vessels are smoothly contoured and widely patent. No branch occlusion, beading, or aneurysm. Posterior circulation: Left dominant vertebral artery. The basilar artery is smoothly contoured and widely patent. No branch occlusion, beading, or aneurysm. IMPRESSION: Brain MRI: 1. No acute finding including infarct. 2. Generalized atrophy. 3. Moderate motion artifact. Intracranial MRA: Negative. Electronically Signed   By: Jorje Guild M.D.   On: 07/28/2022 12:12   MR ANGIO HEAD WO CONTRAST  Result Date: 07/28/2022 CLINICAL DATA:  Stroke follow-up EXAM: MRI HEAD WITHOUT  CONTRAST MRA HEAD WITHOUT CONTRAST TECHNIQUE: Multiplanar, multi-echo pulse sequences of the brain and surrounding structures were acquired without intravenous contrast. Angiographic images of the Circle of Willis were acquired using MRA technique without intravenous contrast. COMPARISON:  07/05/2022 FINDINGS: MRI HEAD FINDINGS Brain: No acute infarction, hemorrhage, hydrocephalus, extra-axial collection or mass lesion. Generalized brain atrophy. Mild chronic small vessel ischemia in the cerebral white matter for age. Vascular: See below Skull and upper cervical spine: Normal marrow signal Sinuses/Orbits: Unremarkable Other: Intermittent significant motion artifact MRA HEAD FINDINGS Anterior circulation: Accounting for artifact the major vessels are smoothly contoured and widely patent. No branch occlusion, beading, or aneurysm. Posterior circulation: Left dominant vertebral artery. The basilar artery is smoothly contoured and widely patent. No branch occlusion, beading, or aneurysm. IMPRESSION: Brain MRI: 1. No acute finding including infarct. 2. Generalized atrophy. 3. Moderate motion artifact. Intracranial MRA: Negative. Electronically Signed   By: Jorje Guild M.D.   On: 07/28/2022 12:12   CT HEAD WO CONTRAST  Result Date: 07/28/2022 CLINICAL DATA:  Provided history: Stroke, follow-up. EXAM: CT HEAD WITHOUT CONTRAST TECHNIQUE: Contiguous axial images were obtained from the base of the skull through the vertex without intravenous contrast. RADIATION DOSE REDUCTION: This exam was performed according to the departmental dose-optimization program which includes automated exposure control, adjustment of the mA and/or kV according to patient size and/or use of iterative reconstruction technique. COMPARISON:  Noncontrast head CT and CT angiogram head/neck 07/27/2022. FINDINGS: Motion degraded examination, particularly at the level the skull base. Brain: Generalized cerebral atrophy. Mild patchy and ill-defined  hypoattenuation within the cerebral white matter, nonspecific but compatible with chronic small ischemic disease. There is no acute intracranial hemorrhage. No demarcated cortical infarct. No extra-axial fluid collection. No evidence of an intracranial mass. No midline shift. Vascular: No hyperdense vessel.  Atherosclerotic calcifications. Skull: No fracture or aggressive osseous lesion. Sinuses/Orbits: No mass or acute finding within the imaged orbits. 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 IMPRESSION: 1. Motion degraded examination, particularly at the level the skull base. 2.  No evidence of an acute intracranial abnormality. 3. Parenchymal atrophy and chronic small ischemic disease. Electronically  Signed   By: Kellie Simmering D.O.   On: 07/28/2022 10:45   ECHOCARDIOGRAM COMPLETE  Result Date: 07/06/2022    ECHOCARDIOGRAM REPORT   Patient Name:   Vardell Rodocker III Date of Exam: 07/06/2022 Medical Rec #:  [id number]              Height:       67.0 in Accession #:    [id number]             Weight:       209.4 lb Date of Birth:  [birth year]               BSA:          2.062 m Patient Age:    12 years               BP:           83/56 mmHg Patient Gender: M                      HR:           102 bpm. Exam Location:  Inpatient Procedure: 2D Echo, Color Doppler and Cardiac Doppler Indications:    elevated troponin  History:        Patient has prior history of Echocardiogram examinations.                 Previous Myocardial Infarction and CAD, Arrythmias:Atrial                 Fibrillation and RBBB; Risk Factors:Hypertension.  Sonographer:    Phineas Douglas Referring Phys: [id number] [place]  1. Left ventricular ejection fraction, by estimation, is 55 to 60%. The left ventricle has normal function. The left ventricle has no regional wall motion abnormalities. There is moderate left ventricular hypertrophy. Left ventricular diastolic parameters are indeterminate.  2. Right ventricular systolic  function is normal. The right ventricular size is normal.  3. Left atrial size was moderately dilated.  4. The mitral valve is abnormal. Mild mitral valve regurgitation. No evidence of mitral stenosis.  5. Fused right and left cusp suspect there is at least mild stenosis Consider having patient return for dedicated CW doppler interrogation of AV. The aortic valve is normal in structure. There is moderate calcification of the aortic valve. There is moderate thickening of the aortic valve. Aortic valve regurgitation is mild. Aortic valve sclerosis/calcification is present, without any evidence of aortic stenosis.  6. The inferior vena cava is normal in size with greater than 50% respiratory variability, suggesting right atrial pressure of 3 mmHg. FINDINGS  Left Ventricle: Left ventricular ejection fraction, by estimation, is 55 to 60%. The left ventricle has normal function. The left ventricle has no regional wall motion abnormalities. The left ventricular internal cavity size was normal in size. There is  moderate left ventricular hypertrophy. Left ventricular diastolic parameters are indeterminate. Right Ventricle: The right ventricular size is normal. No increase in right ventricular wall thickness. Right ventricular systolic function is normal. Left Atrium: Left atrial size was moderately dilated. Right Atrium: Right atrial size was normal in size. Pericardium: There is no evidence of pericardial effusion. Mitral Valve: The mitral valve is abnormal. There is mild thickening of the mitral valve leaflet(s). There is mild calcification of the mitral valve leaflet(s). Mild mitral annular calcification. Mild mitral valve regurgitation. No evidence of mitral valve stenosis. Tricuspid Valve: The tricuspid valve is normal in structure. Tricuspid valve regurgitation  is mild . No evidence of tricuspid stenosis. Aortic Valve: Fused right and left cusp suspect there is at least mild stenosis Consider having patient return for  dedicated CW doppler interrogation of AV. The aortic valve is normal in structure. There is moderate calcification of the aortic valve. There is moderate thickening of the aortic valve. Aortic valve regurgitation is mild. Aortic regurgitation PHT measures 443 msec. Aortic valve sclerosis/calcification is present, without any evidence of aortic stenosis. Pulmonic Valve: The pulmonic valve was normal in structure. Pulmonic valve regurgitation is not visualized. No evidence of pulmonic stenosis. Aorta: The aortic root is normal in size and structure. Venous: The inferior vena cava is normal in size with greater than 50% respiratory variability, suggesting right atrial pressure of 3 mmHg. IAS/Shunts: No atrial level shunt detected by color flow Doppler.  LEFT VENTRICLE PLAX 2D LVIDd:         4.60 cm      Diastology LVIDs:         3.10 cm      LV e' medial:    9.14 cm/s LV PW:         1.30 cm      LV E/e' medial:  7.3 LV IVS:        1.50 cm      LV e' lateral:   8.70 cm/s LVOT diam:     2.10 cm      LV E/e' lateral: 7.6 LV SV:         43 LV SV Index:   21 LVOT Area:     3.46 cm  LV Volumes (MOD) LV vol d, MOD A2C: 124.0 ml LV vol d, MOD A4C: 101.0 ml LV vol s, MOD A2C: 53.7 ml LV vol s, MOD A4C: 42.3 ml LV SV MOD A2C:     70.3 ml LV SV MOD A4C:     101.0 ml LV SV MOD BP:      71.4 ml RIGHT VENTRICLE             IVC RV Basal diam:  3.80 cm     IVC diam: 2.60 cm RV S prime:     10.10 cm/s TAPSE (M-mode): 1.5 cm LEFT ATRIUM             Index        RIGHT ATRIUM           Index LA diam:        4.30 cm 2.09 cm/m   RA Area:     20.90 cm LA Vol (A2C):   96.7 ml 46.89 ml/m  RA Volume:   61.00 ml  29.58 ml/m LA Vol (A4C):   99.2 ml 48.10 ml/m LA Biplane Vol: 97.7 ml 47.38 ml/m  AORTIC VALVE LVOT Vmax:   78.30 cm/s LVOT Vmean:  48.700 cm/s LVOT VTI:    0.125 m AI PHT:      443 msec  AORTA Ao Root diam: 3.40 cm Ao Asc diam:  3.20 cm MITRAL VALVE MV Area (PHT): 5.16 cm    SHUNTS MV Decel Time: 147 msec    Systemic VTI:  0.12  m MV E velocity: 66.40 cm/s  Systemic Diam: 2.10 cm Jenkins Rouge MD Electronically signed by Jenkins Rouge MD Signature Date/Time: 07/06/2022/10:06:05 AM    Final    MR BRAIN WO CONTRAST  Result Date: 07/05/2022 CLINICAL DATA:  Neuro deficit, acute, stroke suspected Mental status change, unknown cause EXAM: MRI HEAD WITHOUT CONTRAST TECHNIQUE: Multiplanar, multiecho pulse  sequences of the brain and surrounding structures were obtained without intravenous contrast. COMPARISON:  Same day CT head. FINDINGS: Brain: No acute infarction, hemorrhage, hydrocephalus, extra-axial collection or mass lesion. Chronic microvascular ischemic disease. Vascular: Major arterial flow voids are maintained at the skull base. Skull and upper cervical spine: Normal marrow signal. Sinuses/Orbits: Negative. Other: No sizable mastoid effusions. IMPRESSION: No evidence of acute intracranial abnormality. Electronically Signed   By: Margaretha Sheffield M.D.   On: 07/05/2022 18:58   CT Head Wo Contrast  Result Date: 07/05/2022 CLINICAL DATA:  Head trauma, minor (Age >= 65y) EXAM: CT HEAD WITHOUT CONTRAST TECHNIQUE: Contiguous axial images were obtained from the base of the skull through the vertex without intravenous contrast. RADIATION DOSE REDUCTION: This exam was performed according to the departmental dose-optimization program which includes automated exposure control, adjustment of the mA and/or kV according to patient size and/or use of iterative reconstruction technique. COMPARISON:  May 04, 2022. FINDINGS: Brain: No evidence of acute infarction, hemorrhage, hydrocephalus, extra-axial collection or mass lesion/mass effect. Patchy white matter hypodensities nonspecific but compatible with chronic microvascular ischemic disease. Vascular: No hyperdense vessel. Skull: No acute fracture. Sinuses/Orbits: Anterior right ethmoid air cell osteoma. No acute orbital findings. Other: No mastoid effusions IMPRESSION: No evidence of acute  intracranial abnormality. Electronically Signed   By: Margaretha Sheffield M.D.   On: 07/05/2022 14:33   CT Cervical Spine Wo Contrast  Result Date: 07/05/2022 CLINICAL DATA:  Neck trauma EXAM: CT CERVICAL SPINE WITHOUT CONTRAST TECHNIQUE: Multidetector CT imaging of the cervical spine was performed without intravenous contrast. Multiplanar CT image reconstructions were also generated. RADIATION DOSE REDUCTION: This exam was performed according to the departmental dose-optimization program which includes automated exposure control, adjustment of the mA and/or kV according to patient size and/or use of iterative reconstruction technique. COMPARISON:  None Available. FINDINGS: Alignment: Facet joints are aligned without dislocation or traumatic listhesis. Dens and lateral masses are aligned. Degenerative facet mediated grade 1 anterolisthesis of C4 on C5. Skull base and vertebrae: No acute fracture. No primary bone lesion or focal pathologic process. Soft tissues and spinal canal: No prevertebral fluid or swelling. No visible canal hematoma. Disc levels: Degenerative disc disease of the C4-5 through C6-7 levels. Multilevel facet arthropathy. The left C4-5 facet joint is fused. Upper chest: Negative. Other: Bilateral carotid atherosclerosis. Small calcified left anterior chain cervical lymph nodes. IMPRESSION: 1. No acute fracture or traumatic listhesis of the cervical spine. 2. Multilevel degenerative disc disease and facet arthropathy. Electronically Signed   By: Davina Poke D.O.   On: 07/05/2022 14:04   DG Chest Port 1 View  Result Date: 07/05/2022 CLINICAL DATA:  Vomiting EXAM: PORTABLE CHEST 1 VIEW COMPARISON:  CXR 05/04/22 FINDINGS: No pleural effusion. No pneumothorax. There are patchy opacities in the right mid and lower lung, as well as the left lung base. These are worrisome for multifocal infection, possibly related to aspiration. No displaced rib fractures. Visualized upper abdomen is notable for  gaseous distention of the colon at the splenic flexure. IMPRESSION: Patchy opacities in the right mid and lower lung, as well as the left lung base. These are worrisome for multifocal infection, possibly related to aspiration. Electronically Signed   By: Marin Roberts M.D.   On: 07/05/2022 13:33     PHYSICAL EXAM  Temp:  [97.1 F (36.2 C)-97.7 F (36.5 C)] 97.7 F (36.5 C) (02/17 1947) Pulse Rate:  [64-122] 75 (02/17 2000) Resp:  [13-25] 20 (02/17 2000) BP: (85-188)/(53-176) 121/81 (02/17 2148) SpO2:  [  82 %-100 %] 96 % (02/17 2000)  General - Well nourished, well developed, in no apparent distress.  Ophthalmologic - fundi not visualized due to noncooperation.  Cardiovascular - irregularly irregular heart rate and rhythm.   Mental Status -  Level of arousal and orientation to age, place, year, and person were intact, but not to month. Language including expression, naming, repetition, comprehension was assessed and found intact.  Cranial Nerves II - XII - II - Visual field intact OU. III, IV, VI - Extraocular movements intact. V - Facial sensation intact bilaterally. VII - mild left nasolabial fold flattening, likely chronic per wife VIII - Hearing & vestibular intact bilaterally. X - Palate elevates symmetrically. XI - Chin turning & shoulder shrug intact bilaterally. XII - Tongue protrusion intact.  Motor Strength - The patient's strength was normal in all extremities and pronator drift was absent.  Bulk was normal and fasciculations were absent.   Motor Tone - Muscle tone was assessed at the neck and appendages and was normal.  Reflexes - The patient's reflexes were symmetrical in all extremities and he had no pathological reflexes.  Sensory - Light touch, temperature/pinprick were assessed and were symmetrical.    Coordination - The patient had normal movements in the hands and feet with no ataxia or dysmetria.  Tremor was absent.  Gait and Station -  deferred.   ASSESSMENT/PLAN Mr. Ryanjoseph Wrice III is a 82 y.o. male with history of  HTN, afib off coumadin, dementia, bipolar disorder, throat cancer status post chemotherapy and radiation admitted for right-sided weakness, slurred speech, confusion and aphasia.  tPA was given at outside hospital.    Stroke like symptoms status post TNK TIA likely due to A-fib not on [facility] CT at outside hospital no acute finding MRI no acute infarct MRA negative Carotid Doppler unremarkable 2D Echo EF 55 to 60% on 07/06/2022 LDL 40 HgbA1c 5.6 SCDs for VTE prophylaxis aspirin 81 mg daily prior to admission, now on aspirin 81 mg daily. Will need to discuss with cardiology regarding DOAC vs. Watchman device.  Ongoing aggressive stroke risk factor management Therapy recommendations: Pending Disposition: Pending  Chronic A-fib Follow-up with Dr. Geraldo Pitter at [facility] Was on Coumadin before 07/05/2022 admission for pneumonia, AKI.  Before that admission, patient was found down initially in the pool of dark vomit, concerning for GI bleeding.  Hemoglobin stable.  GI evaluated patient and did not plan for any endoscopic evaluation warfarin was discontinued in the setting of presumed high risk bleeding.  Metoprolol continued and amiodarone started.  Plan to follow-up with Dr. Geraldo Pitter as outpatient to consider if Watchman device needed. Currently on aspirin 81.  Patient has no further GI bleeding. Will contact cardiology regarding DOAC versus Watchman device in the setting of current TIA status post TNK.  Hypertension Stable BP goal less than 180/105 Long term BP goal normotensive  Hyperlipidemia Home meds: Crestor 10 LDL 40, goal < 70 Now on Crestor 10, no high intensity statin given LDL at goal and advanced age Continue statin at discharge  Other Stroke Risk Factors Advanced age  Other Active Problems Dementia Bipolar disorder on Seroquel and Lamictal Stroke cancer status post chemotherapy and  radiation  Hospital day # 1  This patient is critically ill due to strokelike symptoms status post TNK, chronic A-fib not on AC, and at significant risk of neurological worsening, death form recurrent stroke, hemorrhagic transformation, bleeding from TNK, heart failure. This patient's care requires constant monitoring of vital signs, hemodynamics, respiratory  and cardiac monitoring, review of multiple databases, neurological assessment, discussion with family, other specialists and medical decision making of high complexity. I spent 40 minutes of neurocritical care time in the care of this patient. I had long discussion with patient and wife and daughter at bedside, updated pt current condition, treatment plan and potential prognosis, and answered all the questions.  They expressed understanding and appreciation.    Rosalin Hawking, MD PhD Stroke Neurology 07/28/2022 9:52 PM    To contact Stroke Continuity provider, please refer to [email or web address] After hours, contact General Neurology

## 2022-07-28 NOTE — Progress Notes (Signed)
Carotid artery duplex has been completed. Preliminary results can be found in CV Proc through chart review.   07/28/22 1:04 PM [name] RVT
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
## 2022-07-29 DIAGNOSIS — I482 Chronic atrial fibrillation, unspecified: Secondary | ICD-10-CM | POA: Diagnosis not present

## 2022-07-29 DIAGNOSIS — G459 Transient cerebral ischemic attack, unspecified: Secondary | ICD-10-CM | POA: Diagnosis not present

## 2022-07-29 LAB — BASIC METABOLIC PANEL
Anion gap: 9 (ref 5–15)
BUN: 10 mg/dL (ref 8–23)
CO2: 23 mmol/L (ref 22–32)
Calcium: 8.9 mg/dL (ref 8.9–10.3)
Chloride: 108 mmol/L (ref 98–111)
Creatinine, Ser: 1.52 mg/dL — ABNORMAL HIGH (ref 0.61–1.24)
GFR, Estimated: 46 mL/min — ABNORMAL LOW (ref >60)
Glucose, Bld: 95 mg/dL (ref 70–99)
Potassium: 4 mmol/L (ref 3.5–5.1)
Sodium: 140 mmol/L (ref 135–145)

## 2022-07-29 LAB — CBC
HCT: 37.1 % — ABNORMAL LOW (ref 39.0–52.0)
Hemoglobin: 11.8 g/dL — ABNORMAL LOW (ref 13.0–17.0)
MCH: 28.6 pg (ref 26.0–34.0)
MCHC: 31.8 g/dL (ref 30.0–36.0)
MCV: 90 fL (ref 80.0–100.0)
Platelets: 172 10*3/uL (ref 150–400)
RBC: 4.12 MIL/uL — ABNORMAL LOW (ref 4.22–5.81)
RDW: 16 % — ABNORMAL HIGH (ref 11.5–15.5)
WBC: 4.3 10*3/uL (ref 4.0–10.5)
nRBC: 0 % (ref 0.0–0.2)

## 2022-07-29 LAB — GLUCOSE, CAPILLARY
Glucose-Capillary: 88 mg/dL (ref 70–99)
Glucose-Capillary: 85 mg/dL (ref 70–99)
Glucose-Capillary: 94 mg/dL (ref 70–99)
Glucose-Capillary: 103 mg/dL — ABNORMAL HIGH (ref 70–99)
Glucose-Capillary: 113 mg/dL — ABNORMAL HIGH (ref 70–99)

## 2022-07-29 MED ORDER — HYDROXYZINE HCL 25 MG PO TABS
25.0000 mg | ORAL_TABLET | Freq: Four times a day (QID) | ORAL | Status: DC | PRN
  Filled 2022-07-29 (×2): qty 1

## 2022-07-29 MED ORDER — LABETALOL HCL 5 MG/ML IV SOLN
10.0000 mg | INTRAVENOUS | Status: DC | PRN
  Filled 2022-07-29: qty 4

## 2022-07-29 MED ORDER — LORAZEPAM 1 MG PO TABS: 1.0000 mg | ORAL_TABLET | Freq: Two times a day (BID) | ORAL | Status: DC | PRN

## 2022-07-29 MED ORDER — LIDOCAINE 5 % EX PTCH
2.0000 | MEDICATED_PATCH | CUTANEOUS | Status: DC
  Administered 2022-07-29 – 2022-08-02 (×5): 2 via TRANSDERMAL
  Filled 2022-07-29 (×5): qty 2

## 2022-07-29 MED ORDER — CHLORHEXIDINE GLUCONATE CLOTH 2 % EX PADS
6.0000 | MEDICATED_PAD | Freq: Every day | CUTANEOUS | Status: DC
  Administered 2022-07-29: 6 via TOPICAL

## 2022-07-29 NOTE — Plan of Care (Signed)
  Problem: Education: Goal: Knowledge of General Education information will improve Description: Including pain rating scale, medication(s)/side effects and non-pharmacologic comfort measures Outcome: Progressing   Problem: Health Behavior/Discharge Planning: Goal: Ability to manage health-related needs will improve Outcome: Progressing   Problem: Clinical Measurements: Goal: Ability to maintain clinical measurements within normal limits will improve Outcome: Progressing Goal: Will remain free from infection Outcome: Progressing Goal: Diagnostic test results will improve Outcome: Progressing Goal: Respiratory complications will improve Outcome: Progressing Goal: Cardiovascular complication will be avoided Outcome: Progressing   Problem: Activity: Goal: Risk for activity intolerance will decrease Outcome: Progressing   Problem: Nutrition: Goal: Adequate nutrition will be maintained Outcome: Progressing   Problem: Coping: Goal: Level of anxiety will decrease Outcome: Progressing   Problem: Elimination: Goal: Will not experience complications related to bowel motility Outcome: Progressing Goal: Will not experience complications related to urinary retention Outcome: Progressing   Problem: Pain Managment: Goal: General experience of comfort will improve Outcome: Progressing   Problem: Safety: Goal: Ability to remain free from injury will improve Outcome: Progressing   Problem: Skin Integrity: Goal: Risk for impaired skin integrity will decrease Outcome: Progressing   Problem: Education: Goal: Knowledge of disease or condition will improve Outcome: Progressing Goal: Knowledge of secondary prevention will improve (MUST DOCUMENT ALL) Outcome: Progressing Goal: Knowledge of patient specific risk factors will improve Elta [place] N/A or DELETE if not current risk factor) Outcome: Progressing   Problem: Ischemic Stroke/TIA Tissue Perfusion: Goal: Complications of ischemic  stroke/TIA will be minimized Outcome: Progressing   Problem: Coping: Goal: Will verbalize positive feelings about self Outcome: Progressing Goal: Will identify appropriate support needs Outcome: Progressing   Problem: Health Behavior/Discharge Planning: Goal: Ability to manage health-related needs will improve Outcome: Progressing Goal: Goals will be collaboratively established with patient/family Outcome: Progressing   Problem: Self-Care: Goal: Ability to participate in self-care as condition permits will improve Outcome: Progressing Goal: Verbalization of feelings and concerns over difficulty with self-care will improve Outcome: Progressing Goal: Ability to communicate needs accurately will improve Outcome: Progressing   Problem: Nutrition: Goal: Risk of aspiration will decrease Outcome: Progressing Goal: Dietary intake will improve Outcome: Progressing

## 2022-07-29 NOTE — Progress Notes (Signed)
Attempted to give report to 3W RN receiving patient x2, RN not available at times of call, my extension [phone] provided to secretary to give to RN.
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
## 2022-07-29 NOTE — Discharge Summary (Shared)
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 transpired.   Recent hospitalizations:  1/25 Admission to [facility] for acute encephalopathy d/t underlying pneumonia. Patient was found down by wife. Discharged with 4 days of augmentin, discontinued Warfarin (d/c'd by Cardiology), continued on metoprolol and amiodarone.  Early February; Wife states patient went into [facility] d/t weakness after he fell at home and she was unable to help him up. He was then discharged to a rehab facility.  2/5: Watchman procedure cancelled by Dr. Stanford Breed d/t confusion and falls.    LKW: 0700 tpa given?: yes, [id number] @ [facility]) IR Thrombectomy? No, exam not consistent with LVO mRS: 4 NIHSS: [place] Patient with a history of hypertension, A-fib not on anticoagulation, dementia, bipolar disorder, throat cancer status postchemotherapy and radiation was admitted with right-sided weakness slurred speech, confusion and aphasia. Patient was given TNK at an outside  hospital and experienced resolution of symptoms.  MRI was negative, and patient had TIA versus stroke aborted by TNK.  Before admission, patient was undergoing workup for Watchman device, but procedure was canceled.  Neurological exam is stable and unchanged. VSS  GI cleared patient for Eliquis initiation.  Eliquis was started 2/19. Psychiatry on board, patient under IVC orders.   Transfer to behavioral health hospital for further evaluation and ECT  Stroke like symptoms status post TNK TIA likely due to A-fib not on [facility] CT at outside hospital no acute finding MRI no acute infarct MRA negative Carotid Doppler unremarkable 2D Echo EF 55 to 60% on 07/06/2022 LDL 40 HgbA1c 5.6 SCDs for VTE prophylaxis aspirin 81 mg daily prior to admission, now on aspirin 81 mg daily.  Eliquis started (  approved by GI).   Ongoing aggressive stroke risk factor management Therapy recommendations: HH PT Disposition: Pending   Chronic A-fib Follow-up with Dr. Geraldo Pitter at [facility] Was on Coumadin before 07/05/2022 admission for pneumonia, AKI.  Before that admission, patient was found down initially in the pool of dark vomit, concerning for GI bleeding.  Hemoglobin stable.  GI evaluated patient and did not plan for any endoscopic evaluation warfarin was discontinued in the setting of presumed high risk bleeding.  Metoprolol continued and amiodarone started.  Plan to follow-up with Dr. Geraldo Pitter as outpatient to consider if Watchman device needed. Currently on aspirin 81.  Patient has no further GI bleeding. Discussed with cardiology Dr. Julianne Rice,  Eliquis started (approved by GI).     Bipolar disorder  Safety concerns Wife states that she believes that there are guns are all locked up.  She does have concerns as patient has tried to grab the steering well when they are in traffic and has made comments regarding ending his life.  However he is also stated that he loves life and would never do anything to hurt  himself. He has had ECT in the past that has worked well for him  Psychiatry consulted and recommended IVC and ECT for which cardiology has evaluated patient as moderate cardiac risk Pending discharge to [facility] facility Continue Seroquel and Lamictal  Appreciate help from psych team    Hypothyroidism TSH 28.982->23.659, FT4 0.98, FT3 1.2 On home synthroid, compliant with meds per wife Now on Synthroid 125 Recently started on amiodarone High TSH was discussed with cardiology.  Recommend follow-up labs in 2 to 3 months.   Hypertension Stable BP goal less than 180/105 Long term BP goal normotensive   Hyperlipidemia Home meds: Crestor 10 LDL 40, goal < 70 Now on Crestor 10, no high intensity statin given LDL at goal and advanced age Continue statin at discharge   Other Stroke Risk Factors Advanced age   Other Active Problems Dementia Throat cancer status post chemotherapy and radiation AKI - Cre 1.58->1.72->1.84->1.70->1.80 - on IVF   DISCHARGE EXAM Blood pressure (!) 142/72, pulse 67, temperature 98.2 F (36.8 C), temperature source Oral, resp. rate 16, height 5' 7"$  (1.702 m), weight 94.9 kg, SpO2 97 %. General - Well nourished, well developed, in no apparent distress.   Cardiovascular -sinus rhythm on cardiac monitor Pulmonary -unlabored breathing, no supplemental oxygen needed.   Mental Status -  Drowsy, but arouses to voice. Orientation to person place time and situation were intact Speech is clear and fluent.    Cranial Nerves II - XII - II - Visual field intact OU. [name], IV, VI - Extraocular movements intact. V - Facial sensation intact bilaterally. VII - mild left nasolabial fold flattening VIII - Hearing & vestibular intact bilaterally. X - Palate elevates symmetrically. XI - Chin turning & shoulder shrug intact bilaterally. XII - Tongue protrusion intact.   Motor Strength - The patient's strength was normal in all extremities.  Bulk was normal and  fasciculations were absent.   Motor Tone - Muscle tone was assessed at the neck and appendages and was normal. Sensory - Light touch was assessed and symmetrical.   Coordination - The patient had normal movements in the hands and feet with no ataxia or dysmetria.  Tremor was absent. Gait and Station - deferred.  Discharge Diet       Diet   DIET DYS 3 Room service appropriate? Yes; Fluid consistency: Thin   liquids  DISCHARGE PLAN Disposition:  [place] for ECT Eliquis for secondary stroke prevention Ongoing stroke risk factor control by Primary Care Physician at time of discharge Follow-up PCP [name], MD in 2 weeks. Follow-up in [facility] with Dr. Krista Blue in 4 weeks, office to schedule an appointment.   55 minutes were spent preparing discharge.   Pt seen by Neuro NP/APP and later by MD. Note/plan to be edited by MD as needed.    Otelia Santee, DNP, AGACNP-BC [facility] Please use AMION for pager and EPIC for messaging  ATTENDING NOTE: I reviewed above note and agree with the assessment and plan.   No acute event overnight. Bother arrived and had questions about ECT. Psychiatry team discussed with brother, appreciate help. Pt will transfer to [place] for further management. Continue eliquis and statin, and follow up with cardiology. Also follow up with Dr. Krista Blue at [facility] in 4 weeks after discharge.    For detailed assessment and plan, please refer to above/below as I have made changes wherever appropriate.   Rosalin Hawking, MD PhD Stroke Neurology 08/02/2022 4:08 PM

## 2022-07-29 NOTE — Progress Notes (Addendum)
STROKE TEAM PROGRESS NOTE   SUBJECTIVE (INTERVAL HISTORY) Patient is seen in his room with no family at the bedside.  He complains of bilateral thigh pain which is dull in nature.  Will try lidocaine patch.  Brain MRI reveals no acute abnormalities, stroke was likely aborted by TNK.  Discussed initiation of DOAC with patient's cardiologist Dr. Geraldo Pitter, who is in favor of starting Eliquis as long as it is approved by GI.   OBJECTIVE Temp:  [97.1 F (36.2 C)-97.9 F (36.6 C)] 97.7 F (36.5 C) (02/18 1117) Pulse Rate:  [62-103] 84 (02/18 1000) Cardiac Rhythm: Atrial fibrillation (02/18 0800) Resp:  [10-24] 22 (02/18 1000) BP: (68-163)/(49-139) 118/76 (02/18 1000) SpO2:  [87 %-98 %] 93 % (02/18 1000)  Recent Labs  Lab 07/28/22 1947 07/28/22 2337 07/29/22 0336 07/29/22 0752 07/29/22 1112  GLUCAP 106* 97 88 85 94    Recent Labs  Lab 07/28/22 0117 07/29/22 0646  NA 141 140  K 3.9 4.0  CL 110 108  CO2 25 23  GLUCOSE 88 95  BUN 13 10  CREATININE 1.58* 1.52*  CALCIUM 8.3* 8.9    No results for input(s): "AST", "ALT", "ALKPHOS", "BILITOT", "PROT", "ALBUMIN" in the last 168 hours. Recent Labs  Lab 07/28/22 0117 07/29/22 0646  WBC 4.4 4.3  HGB 12.1* 11.8*  HCT 37.2* 37.1*  MCV 88.6 90.0  PLT 195 172    No results for input(s): "CKTOTAL", "CKMB", "CKMBINDEX", "TROPONINI" in the last 168 hours. No results for input(s): "LABPROT", "INR" in the last 72 hours. No results for input(s): "COLORURINE", "LABSPEC", "PHURINE", "GLUCOSEU", "HGBUR", "BILIRUBINUR", "KETONESUR", "PROTEINUR", "UROBILINOGEN", "NITRITE", "LEUKOCYTESUR" in the last 72 hours.  Invalid input(s): "APPERANCEUR"     Component Value Date/Time   CHOL 104 07/28/2022 0117   CHOL 173 07/25/2021 0923   TRIG 128 07/28/2022 0117   HDL 38 (L) 07/28/2022 0117   HDL 44 07/25/2021 0923   CHOLHDL 2.7 07/28/2022 0117   VLDL 26 07/28/2022 0117   LDLCALC 40 07/28/2022 0117   LDLCALC 86 07/25/2021 0923   Lab Results   Component Value Date   HGBA1C 5.6 07/27/2022   No results found for: "LABOPIA", "COCAINSCRNUR", "LABBENZ", "AMPHETMU", "THCU", "LABBARB"  No results for input(s): "ETH" in the last 168 hours.  I have personally reviewed the radiological images below and agree with the radiology interpretations.  VAS US CAROTID  Result Date: 07/28/2022 Carotid Arterial Duplex Study Patient Name:  [name]  Date of Exam:   07/28/2022 Medical Rec #: [id number]               Accession #:    [id number] Date of Birth: Dec 06, 1940                Patient Gender: M Patient Age:   82 years Exam Location:  [facility] Procedure:      VAS US CAROTID Referring Phys: Rosalin Hawking --------------------------------------------------------------------------------  Indications:       CVA. Risk Factors:      Hypertension. Limitations        Today's exam was limited due to the patient's respiratory                    variation and patient positioning. Comparison Study:  No prior studies. Performing Technologist: Oliver Hum RVT  Examination Guidelines: A complete evaluation includes B-mode imaging, spectral Doppler, color Doppler, and power Doppler as needed of all accessible portions of each vessel. Bilateral testing is considered an integral  part of a complete examination. Limited examinations for reoccurring indications may be performed as noted.  Right Carotid Findings: +----------+--------+--------+--------+--------------------------+--------+           PSV cm/sEDV cm/sStenosisPlaque Description        Comments +----------+--------+--------+--------+--------------------------+--------+ CCA Prox  73      19              smooth and heterogenous            +----------+--------+--------+--------+--------------------------+--------+ CCA Distal64      19              smooth and heterogenous            +----------+--------+--------+--------+--------------------------+--------+ ICA Prox  93      30               irregular and heterogenous         +----------+--------+--------+--------+--------------------------+--------+ ICA Distal92      30                                                 +----------+--------+--------+--------+--------------------------+--------+ ECA       127     17                                                 +----------+--------+--------+--------+--------------------------+--------+ +----------+--------+-------+--------+-------------------+           PSV cm/sEDV cmsDescribeArm Pressure (mmHG) +----------+--------+-------+--------+-------------------+ [id number]                                         +----------+--------+-------+--------+-------------------+ +---------+--------+--+--------+--+---------+ VertebralPSV cm/s39EDV cm/s11Antegrade +---------+--------+--+--------+--+---------+  Left Carotid Findings: +----------+--------+--------+--------+--------------------------+--------+           PSV cm/sEDV cm/sStenosisPlaque Description        Comments +----------+--------+--------+--------+--------------------------+--------+ CCA Prox  106     15              smooth and heterogenous            +----------+--------+--------+--------+--------------------------+--------+ CCA Distal79      25              irregular and heterogenous         +----------+--------+--------+--------+--------------------------+--------+ ICA Prox  62      15              irregular and heterogenous         +----------+--------+--------+--------+--------------------------+--------+ ICA Distal86      30                                        tortuous +----------+--------+--------+--------+--------------------------+--------+ ECA       106     16                                                 +----------+--------+--------+--------+--------------------------+--------+ +----------+--------+--------+--------+-------------------+           PSV  cm/sEDV cm/sDescribeArm Pressure (mmHG) +----------+--------+--------+--------+-------------------+ [id number]                                         +----------+--------+--------+--------+-------------------+ +---------+--------+--+--------+--+---------+  VertebralPSV cm/s64EDV cm/s21Antegrade +---------+--------+--+--------+--+---------+   Summary: Right Carotid: Velocities in the right ICA are consistent with a 1-39% stenosis. Left Carotid: Velocities in the left ICA are consistent with a 1-39% stenosis. Vertebrals: Bilateral vertebral arteries demonstrate antegrade flow. *See table(s) above for measurements and observations.     Preliminary    MR BRAIN WO CONTRAST  Result Date: 07/28/2022 CLINICAL DATA:  Stroke follow-up EXAM: MRI HEAD WITHOUT CONTRAST MRA HEAD WITHOUT CONTRAST TECHNIQUE: Multiplanar, multi-echo pulse sequences of the brain and surrounding structures were acquired without intravenous contrast. Angiographic images of the Circle of Willis were acquired using MRA technique without intravenous contrast. COMPARISON:  07/05/2022 FINDINGS: MRI HEAD FINDINGS Brain: No acute infarction, hemorrhage, hydrocephalus, extra-axial collection or mass lesion. Generalized brain atrophy. Mild chronic small vessel ischemia in the cerebral white matter for age. Vascular: See below Skull and upper cervical spine: Normal marrow signal Sinuses/Orbits: Unremarkable Other: Intermittent significant motion artifact MRA HEAD FINDINGS Anterior circulation: Accounting for artifact the major vessels are smoothly contoured and widely patent. No branch occlusion, beading, or aneurysm. Posterior circulation: Left dominant vertebral artery. The basilar artery is smoothly contoured and widely patent. No branch occlusion, beading, or aneurysm. IMPRESSION: Brain MRI: 1. No acute finding including infarct. 2. Generalized atrophy. 3. Moderate motion artifact. Intracranial MRA: Negative. Electronically Signed    By: Jorje Guild M.D.   On: 07/28/2022 12:12   MR ANGIO HEAD WO CONTRAST  Result Date: 07/28/2022 CLINICAL DATA:  Stroke follow-up EXAM: MRI HEAD WITHOUT CONTRAST MRA HEAD WITHOUT CONTRAST TECHNIQUE: Multiplanar, multi-echo pulse sequences of the brain and surrounding structures were acquired without intravenous contrast. Angiographic images of the Circle of Willis were acquired using MRA technique without intravenous contrast. COMPARISON:  07/05/2022 FINDINGS: MRI HEAD FINDINGS Brain: No acute infarction, hemorrhage, hydrocephalus, extra-axial collection or mass lesion. Generalized brain atrophy. Mild chronic small vessel ischemia in the cerebral white matter for age. Vascular: See below Skull and upper cervical spine: Normal marrow signal Sinuses/Orbits: Unremarkable Other: Intermittent significant motion artifact MRA HEAD FINDINGS Anterior circulation: Accounting for artifact the major vessels are smoothly contoured and widely patent. No branch occlusion, beading, or aneurysm. Posterior circulation: Left dominant vertebral artery. The basilar artery is smoothly contoured and widely patent. No branch occlusion, beading, or aneurysm. IMPRESSION: Brain MRI: 1. No acute finding including infarct. 2. Generalized atrophy. 3. Moderate motion artifact. Intracranial MRA: Negative. Electronically Signed   By: Jorje Guild M.D.   On: 07/28/2022 12:12   CT HEAD WO CONTRAST  Result Date: 07/28/2022 CLINICAL DATA:  Provided history: Stroke, follow-up. EXAM: CT HEAD WITHOUT CONTRAST TECHNIQUE: Contiguous axial images were obtained from the base of the skull through the vertex without intravenous contrast. RADIATION DOSE REDUCTION: This exam was performed according to the departmental dose-optimization program which includes automated exposure control, adjustment of the mA and/or kV according to patient size and/or use of iterative reconstruction technique. COMPARISON:  Noncontrast head CT and CT angiogram  head/neck 07/27/2022. FINDINGS: Motion degraded examination, particularly at the level the skull base. Brain: Generalized cerebral atrophy. Mild patchy and ill-defined hypoattenuation within the cerebral white matter, nonspecific but compatible with chronic small ischemic disease. There is no acute intracranial hemorrhage. No demarcated cortical infarct. No extra-axial fluid collection. No evidence of an intracranial mass. No midline shift. Vascular: No hyperdense vessel.  Atherosclerotic calcifications. Skull: No fracture or aggressive osseous lesion. Sinuses/Orbits: No mass or acute finding within the imaged orbits. 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 IMPRESSION: 1. Motion degraded examination,  particularly at the level the skull base. 2.  No evidence of an acute intracranial abnormality. 3. Parenchymal atrophy and chronic small ischemic disease. Electronically Signed   By: Kellie Simmering D.O.   On: 07/28/2022 10:45   ECHOCARDIOGRAM COMPLETE  Result Date: 07/06/2022    ECHOCARDIOGRAM REPORT   Patient Name:   [name] Date of Exam: 07/06/2022 Medical Rec #:  [id number]              Height:       67.0 in Accession #:    [id number]             Weight:       209.4 lb Date of Birth:  08/23/1940               BSA:          2.062 m Patient Age:    34 years               BP:           83/56 mmHg Patient Gender: M                      HR:           102 bpm. Exam Location:  Inpatient Procedure: 2D Echo, Color Doppler and Cardiac Doppler Indications:    elevated troponin  History:        Patient has prior history of Echocardiogram examinations.                 Previous Myocardial Infarction and CAD, Arrythmias:Atrial                 Fibrillation and RBBB; Risk Factors:Hypertension.  Sonographer:    Phineas Douglas Referring Phys: [id number] [place]  1. Left ventricular ejection fraction, by estimation, is 55 to 60%. The left ventricle has normal function. The left ventricle has no  regional wall motion abnormalities. There is moderate left ventricular hypertrophy. Left ventricular diastolic parameters are indeterminate.  2. Right ventricular systolic function is normal. The right ventricular size is normal.  3. Left atrial size was moderately dilated.  4. The mitral valve is abnormal. Mild mitral valve regurgitation. No evidence of mitral stenosis.  5. Fused right and left cusp suspect there is at least mild stenosis Consider having patient return for dedicated CW doppler interrogation of AV. The aortic valve is normal in structure. There is moderate calcification of the aortic valve. There is moderate thickening of the aortic valve. Aortic valve regurgitation is mild. Aortic valve sclerosis/calcification is present, without any evidence of aortic stenosis.  6. The inferior vena cava is normal in size with greater than 50% respiratory variability, suggesting right atrial pressure of 3 mmHg. FINDINGS  Left Ventricle: Left ventricular ejection fraction, by estimation, is 55 to 60%. The left ventricle has normal function. The left ventricle has no regional wall motion abnormalities. The left ventricular internal cavity size was normal in size. There is  moderate left ventricular hypertrophy. Left ventricular diastolic parameters are indeterminate. Right Ventricle: The right ventricular size is normal. No increase in right ventricular wall thickness. Right ventricular systolic function is normal. Left Atrium: Left atrial size was moderately dilated. Right Atrium: Right atrial size was normal in size. Pericardium: There is no evidence of pericardial effusion. Mitral Valve: The mitral valve is abnormal. There is mild thickening of the mitral valve leaflet(s). There is mild calcification of the mitral valve leaflet(s). Mild  mitral annular calcification. Mild mitral valve regurgitation. No evidence of mitral valve stenosis. Tricuspid Valve: The tricuspid valve is normal in structure. Tricuspid valve  regurgitation is mild . No evidence of tricuspid stenosis. Aortic Valve: Fused right and left cusp suspect there is at least mild stenosis Consider having patient return for dedicated CW doppler interrogation of AV. The aortic valve is normal in structure. There is moderate calcification of the aortic valve. There is moderate thickening of the aortic valve. Aortic valve regurgitation is mild. Aortic regurgitation PHT measures 443 msec. Aortic valve sclerosis/calcification is present, without any evidence of aortic stenosis. Pulmonic Valve: The pulmonic valve was normal in structure. Pulmonic valve regurgitation is not visualized. No evidence of pulmonic stenosis. Aorta: The aortic root is normal in size and structure. Venous: The inferior vena cava is normal in size with greater than 50% respiratory variability, suggesting right atrial pressure of 3 mmHg. IAS/Shunts: No atrial level shunt detected by color flow Doppler.  LEFT VENTRICLE PLAX 2D LVIDd:         4.60 cm      Diastology LVIDs:         3.10 cm      LV e' medial:    9.14 cm/s LV PW:         1.30 cm      LV E/e' medial:  7.3 LV IVS:        1.50 cm      LV e' lateral:   8.70 cm/s LVOT diam:     2.10 cm      LV E/e' lateral: 7.6 LV SV:         43 LV SV Index:   21 LVOT Area:     3.46 cm  LV Volumes (MOD) LV vol d, MOD A2C: 124.0 ml LV vol d, MOD A4C: 101.0 ml LV vol s, MOD A2C: 53.7 ml LV vol s, MOD A4C: 42.3 ml LV SV MOD A2C:     70.3 ml LV SV MOD A4C:     101.0 ml LV SV MOD BP:      71.4 ml RIGHT VENTRICLE             IVC RV Basal diam:  3.80 cm     IVC diam: 2.60 cm RV S prime:     10.10 cm/s TAPSE (M-mode): 1.5 cm LEFT ATRIUM             Index        RIGHT ATRIUM           Index LA diam:        4.30 cm 2.09 cm/m   RA Area:     20.90 cm LA Vol (A2C):   96.7 ml 46.89 ml/m  RA Volume:   61.00 ml  29.58 ml/m LA Vol (A4C):   99.2 ml 48.10 ml/m LA Biplane Vol: 97.7 ml 47.38 ml/m  AORTIC VALVE LVOT Vmax:   78.30 cm/s LVOT Vmean:  48.700 cm/s LVOT VTI:     0.125 m AI PHT:      443 msec  AORTA Ao Root diam: 3.40 cm Ao Asc diam:  3.20 cm MITRAL VALVE MV Area (PHT): 5.16 cm    SHUNTS MV Decel Time: 147 msec    Systemic VTI:  0.12 m MV E velocity: 66.40 cm/s  Systemic Diam: 2.10 cm Jenkins Rouge MD Electronically signed by Jenkins Rouge MD Signature Date/Time: 07/06/2022/10:06:05 AM    Final    MR BRAIN WO CONTRAST  Result Date: 07/05/2022 CLINICAL DATA:  Neuro deficit, acute, stroke suspected Mental status change, unknown cause EXAM: MRI HEAD WITHOUT CONTRAST TECHNIQUE: Multiplanar, multiecho pulse sequences of the brain and surrounding structures were obtained without intravenous contrast. COMPARISON:  Same day CT head. FINDINGS: Brain: No acute infarction, hemorrhage, hydrocephalus, extra-axial collection or mass lesion. Chronic microvascular ischemic disease. Vascular: Major arterial flow voids are maintained at the skull base. Skull and upper cervical spine: Normal marrow signal. Sinuses/Orbits: Negative. Other: No sizable mastoid effusions. IMPRESSION: No evidence of acute intracranial abnormality. Electronically Signed   By: Margaretha Sheffield M.D.   On: 07/05/2022 18:58   CT Head Wo Contrast  Result Date: 07/05/2022 CLINICAL DATA:  Head trauma, minor (Age >= 65y) EXAM: CT HEAD WITHOUT CONTRAST TECHNIQUE: Contiguous axial images were obtained from the base of the skull through the vertex without intravenous contrast. RADIATION DOSE REDUCTION: This exam was performed according to the departmental dose-optimization program which includes automated exposure control, adjustment of the mA and/or kV according to patient size and/or use of iterative reconstruction technique. COMPARISON:  May 04, 2022. FINDINGS: Brain: No evidence of acute infarction, hemorrhage, hydrocephalus, extra-axial collection or mass lesion/mass effect. Patchy white matter hypodensities nonspecific but compatible with chronic microvascular ischemic disease. Vascular: No hyperdense vessel.  Skull: No acute fracture. Sinuses/Orbits: Anterior right ethmoid air cell osteoma. No acute orbital findings. Other: No mastoid effusions IMPRESSION: No evidence of acute intracranial abnormality. Electronically Signed   By: Margaretha Sheffield M.D.   On: 07/05/2022 14:33   CT Cervical Spine Wo Contrast  Result Date: 07/05/2022 CLINICAL DATA:  Neck trauma EXAM: CT CERVICAL SPINE WITHOUT CONTRAST TECHNIQUE: Multidetector CT imaging of the cervical spine was performed without intravenous contrast. Multiplanar CT image reconstructions were also generated. RADIATION DOSE REDUCTION: This exam was performed according to the departmental dose-optimization program which includes automated exposure control, adjustment of the mA and/or kV according to patient size and/or use of iterative reconstruction technique. COMPARISON:  None Available. FINDINGS: Alignment: Facet joints are aligned without dislocation or traumatic listhesis. Dens and lateral masses are aligned. Degenerative facet mediated grade 1 anterolisthesis of C4 on C5. Skull base and vertebrae: No acute fracture. No primary bone lesion or focal pathologic process. Soft tissues and spinal canal: No prevertebral fluid or swelling. No visible canal hematoma. Disc levels: Degenerative disc disease of the C4-5 through C6-7 levels. Multilevel facet arthropathy. The left C4-5 facet joint is fused. Upper chest: Negative. Other: Bilateral carotid atherosclerosis. Small calcified left anterior chain cervical lymph nodes. IMPRESSION: 1. No acute fracture or traumatic listhesis of the cervical spine. 2. Multilevel degenerative disc disease and facet arthropathy. Electronically Signed   By: Davina Poke D.O.   On: 07/05/2022 14:04   DG Chest Port 1 View  Result Date: 07/05/2022 CLINICAL DATA:  Vomiting EXAM: PORTABLE CHEST 1 VIEW COMPARISON:  CXR 05/04/22 FINDINGS: No pleural effusion. No pneumothorax. There are patchy opacities in the right mid and lower lung, as  well as the left lung base. These are worrisome for multifocal infection, possibly related to aspiration. No displaced rib fractures. Visualized upper abdomen is notable for gaseous distention of the colon at the splenic flexure. IMPRESSION: Patchy opacities in the right mid and lower lung, as well as the left lung base. These are worrisome for multifocal infection, possibly related to aspiration. Electronically Signed   By: Marin Roberts M.D.   On: 07/05/2022 13:33     PHYSICAL EXAM  Temp:  [97.1 F (36.2 C)-97.9 F (36.6 C)]  97.7 F (36.5 C) (02/18 1117) Pulse Rate:  [62-103] 84 (02/18 1000) Resp:  [10-24] 22 (02/18 1000) BP: (68-163)/(49-139) 118/76 (02/18 1000) SpO2:  [87 %-98 %] 93 % (02/18 1000)  General - Well nourished, well developed, in no apparent distress.   Cardiovascular - irregularly irregular heart rate and rhythm.   Mental Status -  Level of arousal and orientation to person place time and situation were intact Speech is clear and fluent  Cranial Nerves II - XII - II - Visual field intact OU. [name], IV, VI - Extraocular movements intact. V - Facial sensation intact bilaterally. VII - mild left nasolabial fold flattening VIII - Hearing & vestibular intact bilaterally. X - Palate elevates symmetrically. XI - Chin turning & shoulder shrug intact bilaterally. XII - Tongue protrusion intact.  Motor Strength - The patient's strength was normal in all extremities and pronator drift was absent.  Bulk was normal and fasciculations were absent.   Motor Tone - Muscle tone was assessed at the neck and appendages and was normal.  Sensory - Light touch was assessed and were symmetrical.    Coordination - The patient had normal movements in the hands and feet with no ataxia or dysmetria.  Tremor was absent.  Gait and Station - deferred.   ASSESSMENT/PLAN [name] is a 82 y.o. male with history of  HTN, afib off coumadin, dementia, bipolar disorder,  throat cancer status post chemotherapy and radiation admitted for right-sided weakness, slurred speech, confusion and aphasia.  tPA was given at outside hospital.  MRI reveals no stroke, patient likely had TIA or stroke aborted by TNK.  Stroke like symptoms status post TNK TIA likely due to A-fib not on [facility] CT at outside hospital no acute finding MRI no acute infarct MRA negative Carotid Doppler unremarkable 2D Echo EF 55 to 60% on 07/06/2022 LDL 40 HgbA1c 5.6 SCDs for VTE prophylaxis aspirin 81 mg daily prior to admission, now on aspirin 81 mg daily. Cardiology is in favor of starting Eliquis as long as it is approved by GI Ongoing aggressive stroke risk factor management Therapy recommendations: HH PT Disposition: Pending  Chronic A-fib Follow-up with Dr. Geraldo Pitter at [facility] Was on Coumadin before 07/05/2022 admission for pneumonia, AKI.  Before that admission, patient was found down initially in the pool of dark vomit, concerning for GI bleeding.  Hemoglobin stable.  GI evaluated patient and did not plan for any endoscopic evaluation warfarin was discontinued in the setting of presumed high risk bleeding.  Metoprolol continued and amiodarone started.  Plan to follow-up with Dr. Geraldo Pitter as outpatient to consider if Watchman device needed. Currently on aspirin 81.  Patient has no further GI bleeding. Discussed Eliquis vs. Watchman with cardiology.  Cardiology is in favor of Eliquis for now, but will need to clear by GI given recent GI bleed.  Hypertension Stable BP goal less than 180/105 Long term BP goal normotensive  Hyperlipidemia Home meds: Crestor 10 LDL 40, goal < 70 Now on Crestor 10, no high intensity statin given LDL at goal and advanced age Continue statin at discharge  Other Stroke Risk Factors Advanced age  Other Active Problems Dementia Bipolar disorder on Seroquel and Lamictal Throat cancer status post chemotherapy and radiation  Hospital day # 2  Patient  seen by NP and then by MD, MD to edit note is needed [place] , MSN, AGACNP-BC [facility] See Amion for schedule and pager information 07/29/2022 12:36 PM  ATTENDING NOTE: I reviewed above note and agree with the assessment and plan. Pt was seen and examined.   No family at the bedside, pt reclining in bed for lunch, neuro intact, no acute event overnight. Still has afib on tele. Discussed with pt cardiologist Dr. Kristian Covey over the phone, he would like pt to start on eliquis and follow up in clinic to consider watchman if needed. He also recommend to call GI for their approval of eliquis given recent questionable GIB. Continue home crestor. PT/OT recommend HH.   For detailed assessment and plan, please refer to above/below as I have made changes wherever appropriate.   Rosalin Hawking, MD PhD Stroke Neurology 07/29/2022 5:31 PM     To contact Stroke Continuity provider, please refer to [email or web address] After hours, contact General Neurology

## 2022-07-30 ENCOUNTER — Other Ambulatory Visit (HOSPITAL_COMMUNITY): Payer: Self-pay

## 2022-07-30 ENCOUNTER — Ambulatory Visit: Payer: Medicare Other | Admitting: Cardiology

## 2022-07-30 ENCOUNTER — Other Ambulatory Visit: Payer: Self-pay | Admitting: Internal Medicine

## 2022-07-30 DIAGNOSIS — G459 Transient cerebral ischemic attack, unspecified: Secondary | ICD-10-CM | POA: Diagnosis not present

## 2022-07-30 DIAGNOSIS — K922 Gastrointestinal hemorrhage, unspecified: Secondary | ICD-10-CM | POA: Diagnosis not present

## 2022-07-30 DIAGNOSIS — I482 Chronic atrial fibrillation, unspecified: Secondary | ICD-10-CM | POA: Diagnosis not present

## 2022-07-30 DIAGNOSIS — F314 Bipolar disorder, current episode depressed, severe, without psychotic features: Secondary | ICD-10-CM | POA: Diagnosis not present

## 2022-07-30 LAB — CBC
HCT: 38.1 % — ABNORMAL LOW (ref 39.0–52.0)
Hemoglobin: 12.6 g/dL — ABNORMAL LOW (ref 13.0–17.0)
MCH: 28.6 pg (ref 26.0–34.0)
MCHC: 33.1 g/dL (ref 30.0–36.0)
MCV: 86.4 fL (ref 80.0–100.0)
Platelets: 188 10*3/uL (ref 150–400)
RBC: 4.41 MIL/uL (ref 4.22–5.81)
RDW: 15.8 % — ABNORMAL HIGH (ref 11.5–15.5)
WBC: 4.8 10*3/uL (ref 4.0–10.5)
nRBC: 0 % (ref 0.0–0.2)

## 2022-07-30 LAB — BASIC METABOLIC PANEL
Anion gap: 9 (ref 5–15)
BUN: 10 mg/dL (ref 8–23)
CO2: 26 mmol/L (ref 22–32)
Calcium: 9.1 mg/dL (ref 8.9–10.3)
Chloride: 103 mmol/L (ref 98–111)
Creatinine, Ser: 1.72 mg/dL — ABNORMAL HIGH (ref 0.61–1.24)
GFR, Estimated: 40 mL/min — ABNORMAL LOW (ref >60)
Glucose, Bld: 93 mg/dL (ref 70–99)
Potassium: 3.9 mmol/L (ref 3.5–5.1)
Sodium: 138 mmol/L (ref 135–145)

## 2022-07-30 LAB — GLUCOSE, CAPILLARY: Glucose-Capillary: 79 mg/dL (ref 70–99)

## 2022-07-30 LAB — TSH: TSH: 28.982 u[IU]/mL — ABNORMAL HIGH (ref 0.350–4.500)

## 2022-07-30 MED ORDER — VITAMIN D 25 MCG (1000 UNIT) PO TABS
5000.0000 [IU] | ORAL_TABLET | Freq: Every day | ORAL | Status: DC
  Administered 2022-07-31 – 2022-08-02 (×3): 5000 [IU] via ORAL
  Filled 2022-07-30 (×3): qty 5

## 2022-07-30 MED ORDER — APIXABAN 5 MG PO TABS
5.0000 mg | ORAL_TABLET | Freq: Two times a day (BID) | ORAL | Status: DC
  Administered 2022-07-30 – 2022-08-02 (×6): 5 mg via ORAL
  Filled 2022-07-30 (×6): qty 1

## 2022-07-30 NOTE — Progress Notes (Signed)
Patient ID: [name], male   DOB: [birth year], 82 y.o.   MRN: [id number]   Brief GI note;  GI was asked today to clear patient for anticoagulation. He was admitted on 07/27/2022 in transfer from outside facility where he presented with right-sided weakness, slurred speech confusion and aphasia He was given tPA at outside facility, and then MRI here revealed no stroke.  Per neurology note that he likely had a TIA or CVA aborted by tPA  Neurology would like to place patient on Eliquis.  Had been seen in consult by our service on 07/06/2022 due to concerns with coffee-ground emesis.  At that time he was found down at home by his family, but there was report of some dark material in his emesis prior to admission.  Patient was adamant that he did not have any vomiting of dark material. He has history of dementia, hypertension, A-fib and was on warfarin at that time, bipolar disorder, prior history of throat cancer status post chemo and radiation.   Had prior EGD in February 2021 per Dr. Lyndel Safe that showed a benign-appearing distal esophageal stenosis with some surrounding esophagitis, small hiatal hernia and gastritis. Had EGD in August 2022 at [facility] which was unremarkable  Did not manifest any active bleeding during that admission last month, hemoglobin remained stable and decision was made not to pursue endoscopic evaluation. Coumadin was held, and palliative care consultation was to be sought for goals of care.  His labs have remained stable since then-on 07/06/2022 hemoglobin was up 11.2  On 07/29/2022 hemoglobin 11.8/hematocrit 37.1 and today hemoglobin 12.6/hematocrit 38.1  Patient is okay for anticoagulation from GI perspective, would place him on a PPI i.e. Protonix 40 mg once daily prophylactically, and leave him on this at discharge  GI will be available if needed.

## 2022-07-30 NOTE — Progress Notes (Signed)
ANTICOAGULATION CONSULT NOTE - Initial Consult  Pharmacy Consult for Eliquis Indication: atrial fibrillation  Allergies  Allergen Reactions   Trileptal [Oxcarbazepine] Rash    Patient Measurements: Weight: 94.9 kg (209 lb 3.5 oz) (chart weight from 07/07/22)  Vital Signs: Temp: 98.8 F (37.1 C) (02/19 1528) Temp Source: Oral (02/19 1528) BP: 131/80 (02/19 1528) Pulse Rate: 64 (02/19 1528)  Labs: Recent Labs    07/28/22 0117 07/29/22 0646 07/30/22 0337  HGB 12.1* 11.8* 12.6*  HCT 37.2* 37.1* 38.1*  PLT 195 172 188  CREATININE 1.58* 1.52* 1.72*    Estimated Creatinine Clearance: 38.2 mL/min (A) (by C-G formula based on SCr of 1.72 mg/dL (H)).   Medical History: Past Medical History:  Diagnosis Date   Acquired hypothyroidism [id number]   Acute metabolic encephalopathy 99991111   Acute MI, true posterior wall, subsequent episode of care ([place]) 08/15/2014   EF 44% with mildly reduced LV function  Formatting of this note might be different from the original. EF 44% with mildly reduced LV function   AKI (acute kidney injury) ([place]) 09/20/2014   Anxiety    Aspiration pneumonia ([place]) 07/05/2022   Atrial fibrillation ([place]) 09/28/2019   ablation in past  Formatting of this note might be different from the original. ablation in past   Bipolar disorder [facility])    w/dementia/psychotic episodes   Bradycardia 09/17/2014   Cancer ([place]) 09/28/2019   Prostate Cancer; Throat Cancer  Formatting of this note might be different from the original. Prostate Cancer; Throat Cancer   Cardiac murmur 09/28/2019   Cataract    Cervical radicular pain 07/16/2019   Cervical spondylosis 06/19/2019   Chest pain with high risk for cardiac etiology 08/19/2014   Chronic depression    Chronic pain due to trauma 07/16/2019   Coffee ground emesis 07/07/2022   Confusion 07/07/2022   Corn of toe 12/11/2018   Coronary artery calcification 07/25/2021   Coronary artery disease 08/10/2014   Degenerative  disc disease, cervical 09/29/2018   Degenerative lumbar disc    Dementia without behavioral disturbance ([place]) 07/16/2019   Disease characterized by destruction of skeletal muscle 08/15/2014   Essential hypertension 09/28/2019   Essential tremor 09/21/2014   History of MI (myocardial infarction) 09/14/2014   Hypercholesteremia    Hypertension    Hypotension 09/14/2014   Long term (current) use of anticoagulants 11/14/2021   Malignant neoplasm of prostate ([place])    Malnutrition of moderate degree [id number]   Metabolic encephalopathy [id number]   Mild aortic stenosis 11/26/2019   Mood change 10/12/2019   Myocardial infarction ([place]) 08/10/2014   Nausea and vomiting 08/19/2014   Onychomycosis of left great toe 08/21/2014   Pain of fifth toe 12/11/2018   Paroxysmal atrial fibrillation ([place]) 07/25/2021   Peptic esophagitis    Peripheral neuropathy    Peripheral neuropathy due to chemotherapy ([place]) 09/21/2014   Prolonged Q-T interval on ECG 08/19/2014   Prostate cancer ([place]) 09/28/2019   Reactive depression 10/12/2019   Renal insufficiency 08/17/2014   Right bundle branch block 09/28/2019   Sepsis ([place]) 05/18/2020   Small fiber neuropathy 12/16/2018   Squamous cell carcinoma of left tonsil (HCC)    Stage 3b chronic kidney disease ([place]) 12/20/2021   Throat cancer ([place])    Thyroid disease    Transaminitis 08/17/2014   Unstable angina ([place]) 09/14/2014   Whiplash injury syndrome, sequela 08/11/2018    Medications:  Medications Prior to Admission  Medication Sig Dispense Refill Last Dose   amiodarone (PACERONE) 200 MG tablet Take  1 tablet (200 mg total) by mouth 2 (two) times daily for 7 days, THEN 1 tablet (200 mg total) daily. (Patient taking differently: 200 mg once daily) 44 tablet 0 07/26/2022   aspirin 81 MG chewable tablet Chew 1 tablet (81 mg total) by mouth daily. 30 tablet 1 07/26/2022   Cholecalciferol (VITAMIN D3) 125 MCG (5000 UT) TABS Take 5,000 Units by mouth daily.    07/26/2022   famotidine (PEPCID) 20 MG tablet Take 20 mg by mouth daily.   07/26/2022   lamoTRIgine (LAMICTAL) 100 MG tablet Take 100 mg by mouth at bedtime.   07/27/2022   levothyroxine (SYNTHROID) 125 MCG tablet Take 125 mcg by mouth daily.   07/26/2022   LORazepam (ATIVAN) 1 MG tablet Take 1 mg by mouth 2 (two) times daily.   07/27/2022   metoprolol tartrate (LOPRESSOR) 50 MG tablet Take 1 tablet (50 mg total) by mouth 2 (two) times daily. 180 tablet 3 07/27/2022 at PM   Omega-3 Fatty Acids (FISH OIL) 1000 MG CPDR Take 2,000 mg by mouth 2 (two) times daily.   07/26/2022   omeprazole (PRILOSEC OTC) 20 MG tablet Take 20 mg by mouth 2 (two) times daily.   07/26/2022   polyethylene glycol (MIRALAX / GLYCOLAX) 17 g packet Take 17 g by mouth daily as needed for constipation.   07/25/2022   QUEtiapine (SEROQUEL XR) 200 MG 24 hr tablet Take 200 mg by mouth at bedtime.   07/27/2022   rosuvastatin (CRESTOR) 10 MG tablet Take 10 mg by mouth every evening.   07/27/2022    Assessment: 82 y.o. M presents with r/o stroke - s/p tpA at outside facility - MRI here showed no stroke. To begin Eliquis for nonvalvular afib. CBC stable. Pt had been on coumadin in past but d/c 07/05/22 due to concern for GIB last month. GI okay with AC as no overt GIB or progressive anemia.  Pt's copay for Eliquis is very high for first month $548.38 due to $545 deductible. Case manager has arranged for one month free then likely deductible will be met by hospitalization/MD visits, etc. If not, wife is ok with one very high month to meet deductible - Dr. Erlinda Hong d/w pt.  Goal of Therapy:  Prevention of CVA Monitor platelets by anticoagulation protocol: Yes   Plan:  Apixaban 17m po BID Will need education  CSherlon Handing PharmD, BCPS Please see amion for complete clinical pharmacist phone list 07/30/2022,6:28 PM

## 2022-07-30 NOTE — Progress Notes (Signed)
Mobility Specialist Progress Note   07/30/22 1415  Mobility  Activity Moved into chair position in bed (LE/UE exercise)  Level of Assistance Standby assist, set-up cues, supervision of patient - no hands on  Assistive Device None  Range of Motion/Exercises Active;All extremities  Activity Response Tolerated well   Patient received in supine, just returned to bed per NT. Presented with confusion but was oriented to location. Agreeable to participate. Opted to LE/UE exercises with supervision. Tolerated without complaint or incident. Was left in supine with all needs met, call bell in reach.   [place] Izora Benn, BS EXP Mobility Specialist Please contact via SecureChat or Rehab office at [phone]
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
## 2022-07-30 NOTE — TOC Benefit Eligibility Note (Signed)
Patient Teacher, English as a foreign language completed.    The patient is currently admitted and upon discharge could be taking Eliquis 5 mg.  The current 30 day co-pay is $548.38 due to a $545.00 deductible.   The patient is insured through [place], [facility] Patient Advocate Specialist [place] Patient Advocate Team Direct Number: [phone]  Fax: [phone]

## 2022-07-30 NOTE — Progress Notes (Addendum)
STROKE TEAM PROGRESS NOTE   SUBJECTIVE (INTERVAL HISTORY) Patient's wife is at the bedside. Patient is laying in bed in NAD.  Neurological exam is stable and unchanged. VSS  Discussed with patient the initiation of DOAC and waiting to hear GI's input in regards to starting due to recent GI bleed. Wife states they are unable to afford Eliquis at this time.  Also, wife has concerns regarding her and patients safety and does not fee comfortable with him being discharged home.  Will ask psych team to evaluate patient before sending patient home.   OBJECTIVE Temp:  [97.7 F (36.5 C)-99.5 F (37.5 C)] 97.7 F (36.5 C) (02/19 1108) Pulse Rate:  [61-88] 74 (02/19 1108) Cardiac Rhythm: Atrial fibrillation;Bundle branch block (02/19 0838) Resp:  [16-24] 19 (02/19 1108) BP: (99-174)/(65-129) 99/65 (02/19 1108) SpO2:  [92 %-98 %] 94 % (02/19 1108)  Recent Labs  Lab 07/29/22 0336 07/29/22 0752 07/29/22 1112 07/29/22 1539 07/29/22 1944  GLUCAP 88 85 94 103* 113*    Recent Labs  Lab 07/28/22 0117 07/29/22 0646 07/30/22 0337  NA 141 140 138  K 3.9 4.0 3.9  CL 110 108 103  CO2 25 23 26  $ GLUCOSE 88 95 93  BUN 13 10 10  $ CREATININE 1.58* 1.52* 1.72*  CALCIUM 8.3* 8.9 9.1    No results for input(s): "AST", "ALT", "ALKPHOS", "BILITOT", "PROT", "ALBUMIN" in the last 168 hours. Recent Labs  Lab 07/28/22 0117 07/29/22 0646 07/30/22 0337  WBC 4.4 4.3 4.8  HGB 12.1* 11.8* 12.6*  HCT 37.2* 37.1* 38.1*  MCV 88.6 90.0 86.4  PLT 195 172 188    No results for input(s): "CKTOTAL", "CKMB", "CKMBINDEX", "TROPONINI" in the last 168 hours. No results for input(s): "LABPROT", "INR" in the last 72 hours. No results for input(s): "COLORURINE", "LABSPEC", "PHURINE", "GLUCOSEU", "HGBUR", "BILIRUBINUR", "KETONESUR", "PROTEINUR", "UROBILINOGEN", "NITRITE", "LEUKOCYTESUR" in the last 72 hours.  Invalid input(s): "APPERANCEUR"     Component Value Date/Time   CHOL 104 07/28/2022 0117   CHOL 173  07/25/2021 0923   TRIG 128 07/28/2022 0117   HDL 38 (L) 07/28/2022 0117   HDL 44 07/25/2021 0923   CHOLHDL 2.7 07/28/2022 0117   VLDL 26 07/28/2022 0117   LDLCALC 40 07/28/2022 0117   LDLCALC 86 07/25/2021 0923   Lab Results  Component Value Date   HGBA1C 5.6 07/27/2022   No results found for: "LABOPIA", "COCAINSCRNUR", "LABBENZ", "AMPHETMU", "THCU", "LABBARB"  No results for input(s): "ETH" in the last 168 hours.  I have personally reviewed the radiological images below and agree with the radiology interpretations.  VAS US CAROTID  Result Date: 07/29/2022 Carotid Arterial Duplex Study Patient Name:  [name]  Date of Exam:   07/28/2022 Medical Rec #: [id number]               Accession #:    [id number] Date of Birth: July 26, 1940                Patient Gender: M Patient Age:   29 years Exam Location:  [facility] Procedure:      VAS US CAROTID Referring Phys: Rosalin Hawking --------------------------------------------------------------------------------  Indications:       CVA. Risk Factors:      Hypertension. Limitations        Today's exam was limited due to the patient's respiratory                    variation and patient positioning. Comparison Study:  No prior studies. Performing Technologist: Oliver Hum RVT  Examination Guidelines: A complete evaluation includes B-mode imaging, spectral Doppler, color Doppler, and power Doppler as needed of all accessible portions of each vessel. Bilateral testing is considered an integral part of a complete examination. Limited examinations for reoccurring indications may be performed as noted.  Right Carotid Findings: +----------+--------+--------+--------+--------------------------+--------+           PSV cm/sEDV cm/sStenosisPlaque Description        Comments +----------+--------+--------+--------+--------------------------+--------+ CCA Prox  73      19              smooth and heterogenous             +----------+--------+--------+--------+--------------------------+--------+ CCA Distal64      19              smooth and heterogenous            +----------+--------+--------+--------+--------------------------+--------+ ICA Prox  93      30              irregular and heterogenous         +----------+--------+--------+--------+--------------------------+--------+ ICA Distal92      30                                                 +----------+--------+--------+--------+--------------------------+--------+ ECA       127     17                                                 +----------+--------+--------+--------+--------------------------+--------+ +----------+--------+-------+--------+-------------------+           PSV cm/sEDV cmsDescribeArm Pressure (mmHG) +----------+--------+-------+--------+-------------------+ [id number]                                         +----------+--------+-------+--------+-------------------+ +---------+--------+--+--------+--+---------+ VertebralPSV cm/s39EDV cm/s11Antegrade +---------+--------+--+--------+--+---------+  Left Carotid Findings: +----------+--------+--------+--------+--------------------------+--------+           PSV cm/sEDV cm/sStenosisPlaque Description        Comments +----------+--------+--------+--------+--------------------------+--------+ CCA Prox  106     15              smooth and heterogenous            +----------+--------+--------+--------+--------------------------+--------+ CCA Distal79      25              irregular and heterogenous         +----------+--------+--------+--------+--------------------------+--------+ ICA Prox  62      15              irregular and heterogenous         +----------+--------+--------+--------+--------------------------+--------+ ICA Distal86      30                                        tortuous  +----------+--------+--------+--------+--------------------------+--------+ ECA       106     16                                                 +----------+--------+--------+--------+--------------------------+--------+ +----------+--------+--------+--------+-------------------+  PSV cm/sEDV cm/sDescribeArm Pressure (mmHG) +----------+--------+--------+--------+-------------------+ Subclavian118                                         +----------+--------+--------+--------+-------------------+ +---------+--------+--+--------+--+---------+ VertebralPSV cm/s64EDV cm/s21Antegrade +---------+--------+--+--------+--+---------+   Summary: Right Carotid: Velocities in the right ICA are consistent with a 1-39% stenosis. Left Carotid: Velocities in the left ICA are consistent with a 1-39% stenosis. Vertebrals: Bilateral vertebral arteries demonstrate antegrade flow. *See table(s) above for measurements and observations.  Electronically signed by Antony Contras MD on 07/29/2022 at 4:05:30 PM.    Final    MR BRAIN WO CONTRAST  Result Date: 07/28/2022 CLINICAL DATA:  Stroke follow-up EXAM: MRI HEAD WITHOUT CONTRAST MRA HEAD WITHOUT CONTRAST TECHNIQUE: Multiplanar, multi-echo pulse sequences of the brain and surrounding structures were acquired without intravenous contrast. Angiographic images of the Circle of Willis were acquired using MRA technique without intravenous contrast. COMPARISON:  07/05/2022 FINDINGS: MRI HEAD FINDINGS Brain: No acute infarction, hemorrhage, hydrocephalus, extra-axial collection or mass lesion. Generalized brain atrophy. Mild chronic small vessel ischemia in the cerebral white matter for age. Vascular: See below Skull and upper cervical spine: Normal marrow signal Sinuses/Orbits: Unremarkable Other: Intermittent significant motion artifact MRA HEAD FINDINGS Anterior circulation: Accounting for artifact the major vessels are smoothly contoured and widely patent. No  branch occlusion, beading, or aneurysm. Posterior circulation: Left dominant vertebral artery. The basilar artery is smoothly contoured and widely patent. No branch occlusion, beading, or aneurysm. IMPRESSION: Brain MRI: 1. No acute finding including infarct. 2. Generalized atrophy. 3. Moderate motion artifact. Intracranial MRA: Negative. Electronically Signed   By: Jorje Guild M.D.   On: 07/28/2022 12:12   MR ANGIO HEAD WO CONTRAST  Result Date: 07/28/2022 CLINICAL DATA:  Stroke follow-up EXAM: MRI HEAD WITHOUT CONTRAST MRA HEAD WITHOUT CONTRAST TECHNIQUE: Multiplanar, multi-echo pulse sequences of the brain and surrounding structures were acquired without intravenous contrast. Angiographic images of the Circle of Willis were acquired using MRA technique without intravenous contrast. COMPARISON:  07/05/2022 FINDINGS: MRI HEAD FINDINGS Brain: No acute infarction, hemorrhage, hydrocephalus, extra-axial collection or mass lesion. Generalized brain atrophy. Mild chronic small vessel ischemia in the cerebral white matter for age. Vascular: See below Skull and upper cervical spine: Normal marrow signal Sinuses/Orbits: Unremarkable Other: Intermittent significant motion artifact MRA HEAD FINDINGS Anterior circulation: Accounting for artifact the major vessels are smoothly contoured and widely patent. No branch occlusion, beading, or aneurysm. Posterior circulation: Left dominant vertebral artery. The basilar artery is smoothly contoured and widely patent. No branch occlusion, beading, or aneurysm. IMPRESSION: Brain MRI: 1. No acute finding including infarct. 2. Generalized atrophy. 3. Moderate motion artifact. Intracranial MRA: Negative. Electronically Signed   By: Jorje Guild M.D.   On: 07/28/2022 12:12   CT HEAD WO CONTRAST  Result Date: 07/28/2022 CLINICAL DATA:  Provided history: Stroke, follow-up. EXAM: CT HEAD WITHOUT CONTRAST TECHNIQUE: Contiguous axial images were obtained from the base of the  skull through the vertex without intravenous contrast. RADIATION DOSE REDUCTION: This exam was performed according to the departmental dose-optimization program which includes automated exposure control, adjustment of the mA and/or kV according to patient size and/or use of iterative reconstruction technique. COMPARISON:  Noncontrast head CT and CT angiogram head/neck 07/27/2022. FINDINGS: Motion degraded examination, particularly at the level the skull base. Brain: Generalized cerebral atrophy. Mild patchy and ill-defined hypoattenuation within the cerebral white matter, nonspecific but compatible with chronic small ischemic disease. There  is no acute intracranial hemorrhage. No demarcated cortical infarct. No extra-axial fluid collection. No evidence of an intracranial mass. No midline shift. Vascular: No hyperdense vessel.  Atherosclerotic calcifications. Skull: No fracture or aggressive osseous lesion. Sinuses/Orbits: No mass or acute finding within the imaged orbits. 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 IMPRESSION: 1. Motion degraded examination, particularly at the level the skull base. 2.  No evidence of an acute intracranial abnormality. 3. Parenchymal atrophy and chronic small ischemic disease. Electronically Signed   By: Kellie Simmering D.O.   On: 07/28/2022 10:45   ECHOCARDIOGRAM COMPLETE  Result Date: 07/06/2022    ECHOCARDIOGRAM REPORT   Patient Name:   Akari Konkel [name] Date of Exam: 07/06/2022 Medical Rec #:  [id number]              Height:       67.0 in Accession #:    [id number]             Weight:       209.4 lb Date of Birth:  1940-12-27               BSA:          2.062 m Patient Age:    56 years               BP:           83/56 mmHg Patient Gender: M                      HR:           102 bpm. Exam Location:  Inpatient Procedure: 2D Echo, Color Doppler and Cardiac Doppler Indications:    elevated troponin  History:        Patient has prior history of Echocardiogram examinations.                  Previous Myocardial Infarction and CAD, Arrythmias:Atrial                 Fibrillation and RBBB; Risk Factors:Hypertension.  Sonographer:    Phineas Douglas Referring Phys: [id number] [place]  1. Left ventricular ejection fraction, by estimation, is 55 to 60%. The left ventricle has normal function. The left ventricle has no regional wall motion abnormalities. There is moderate left ventricular hypertrophy. Left ventricular diastolic parameters are indeterminate.  2. Right ventricular systolic function is normal. The right ventricular size is normal.  3. Left atrial size was moderately dilated.  4. The mitral valve is abnormal. Mild mitral valve regurgitation. No evidence of mitral stenosis.  5. Fused right and left cusp suspect there is at least mild stenosis Consider having patient return for dedicated CW doppler interrogation of AV. The aortic valve is normal in structure. There is moderate calcification of the aortic valve. There is moderate thickening of the aortic valve. Aortic valve regurgitation is mild. Aortic valve sclerosis/calcification is present, without any evidence of aortic stenosis.  6. The inferior vena cava is normal in size with greater than 50% respiratory variability, suggesting right atrial pressure of 3 mmHg. FINDINGS  Left Ventricle: Left ventricular ejection fraction, by estimation, is 55 to 60%. The left ventricle has normal function. The left ventricle has no regional wall motion abnormalities. The left ventricular internal cavity size was normal in size. There is  moderate left ventricular hypertrophy. Left ventricular diastolic parameters are indeterminate. Right Ventricle: The right ventricular size is normal. No increase in right  ventricular wall thickness. Right ventricular systolic function is normal. Left Atrium: Left atrial size was moderately dilated. Right Atrium: Right atrial size was normal in size. Pericardium: There is no evidence of pericardial  effusion. Mitral Valve: The mitral valve is abnormal. There is mild thickening of the mitral valve leaflet(s). There is mild calcification of the mitral valve leaflet(s). Mild mitral annular calcification. Mild mitral valve regurgitation. No evidence of mitral valve stenosis. Tricuspid Valve: The tricuspid valve is normal in structure. Tricuspid valve regurgitation is mild . No evidence of tricuspid stenosis. Aortic Valve: Fused right and left cusp suspect there is at least mild stenosis Consider having patient return for dedicated CW doppler interrogation of AV. The aortic valve is normal in structure. There is moderate calcification of the aortic valve. There is moderate thickening of the aortic valve. Aortic valve regurgitation is mild. Aortic regurgitation PHT measures 443 msec. Aortic valve sclerosis/calcification is present, without any evidence of aortic stenosis. Pulmonic Valve: The pulmonic valve was normal in structure. Pulmonic valve regurgitation is not visualized. No evidence of pulmonic stenosis. Aorta: The aortic root is normal in size and structure. Venous: The inferior vena cava is normal in size with greater than 50% respiratory variability, suggesting right atrial pressure of 3 mmHg. IAS/Shunts: No atrial level shunt detected by color flow Doppler.  LEFT VENTRICLE PLAX 2D LVIDd:         4.60 cm      Diastology LVIDs:         3.10 cm      LV e' medial:    9.14 cm/s LV PW:         1.30 cm      LV E/e' medial:  7.3 LV IVS:        1.50 cm      LV e' lateral:   8.70 cm/s LVOT diam:     2.10 cm      LV E/e' lateral: 7.6 LV SV:         43 LV SV Index:   21 LVOT Area:     3.46 cm  LV Volumes (MOD) LV vol d, MOD A2C: 124.0 ml LV vol d, MOD A4C: 101.0 ml LV vol s, MOD A2C: 53.7 ml LV vol s, MOD A4C: 42.3 ml LV SV MOD A2C:     70.3 ml LV SV MOD A4C:     101.0 ml LV SV MOD BP:      71.4 ml RIGHT VENTRICLE             IVC RV Basal diam:  3.80 cm     IVC diam: 2.60 cm RV S prime:     10.10 cm/s TAPSE (M-mode):  1.5 cm LEFT ATRIUM             Index        RIGHT ATRIUM           Index LA diam:        4.30 cm 2.09 cm/m   RA Area:     20.90 cm LA Vol (A2C):   96.7 ml 46.89 ml/m  RA Volume:   61.00 ml  29.58 ml/m LA Vol (A4C):   99.2 ml 48.10 ml/m LA Biplane Vol: 97.7 ml 47.38 ml/m  AORTIC VALVE LVOT Vmax:   78.30 cm/s LVOT Vmean:  48.700 cm/s LVOT VTI:    0.125 m AI PHT:      443 msec  AORTA Ao Root diam: 3.40 cm Ao Asc diam:  3.20  cm MITRAL VALVE MV Area (PHT): 5.16 cm    SHUNTS MV Decel Time: 147 msec    Systemic VTI:  0.12 m MV E velocity: 66.40 cm/s  Systemic Diam: 2.10 cm Jenkins Rouge MD Electronically signed by Jenkins Rouge MD Signature Date/Time: 07/06/2022/10:06:05 AM    Final    MR BRAIN WO CONTRAST  Result Date: 07/05/2022 CLINICAL DATA:  Neuro deficit, acute, stroke suspected Mental status change, unknown cause EXAM: MRI HEAD WITHOUT CONTRAST TECHNIQUE: Multiplanar, multiecho pulse sequences of the brain and surrounding structures were obtained without intravenous contrast. COMPARISON:  Same day CT head. FINDINGS: Brain: No acute infarction, hemorrhage, hydrocephalus, extra-axial collection or mass lesion. Chronic microvascular ischemic disease. Vascular: Major arterial flow voids are maintained at the skull base. Skull and upper cervical spine: Normal marrow signal. Sinuses/Orbits: Negative. Other: No sizable mastoid effusions. IMPRESSION: No evidence of acute intracranial abnormality. Electronically Signed   By: Margaretha Sheffield M.D.   On: 07/05/2022 18:58   CT Head Wo Contrast  Result Date: 07/05/2022 CLINICAL DATA:  Head trauma, minor (Age >= 65y) EXAM: CT HEAD WITHOUT CONTRAST TECHNIQUE: Contiguous axial images were obtained from the base of the skull through the vertex without intravenous contrast. RADIATION DOSE REDUCTION: This exam was performed according to the departmental dose-optimization program which includes automated exposure control, adjustment of the mA and/or kV according to patient  size and/or use of iterative reconstruction technique. COMPARISON:  May 04, 2022. FINDINGS: Brain: No evidence of acute infarction, hemorrhage, hydrocephalus, extra-axial collection or mass lesion/mass effect. Patchy white matter hypodensities nonspecific but compatible with chronic microvascular ischemic disease. Vascular: No hyperdense vessel. Skull: No acute fracture. Sinuses/Orbits: Anterior right ethmoid air cell osteoma. No acute orbital findings. Other: No mastoid effusions IMPRESSION: No evidence of acute intracranial abnormality. Electronically Signed   By: Margaretha Sheffield M.D.   On: 07/05/2022 14:33   CT Cervical Spine Wo Contrast  Result Date: 07/05/2022 CLINICAL DATA:  Neck trauma EXAM: CT CERVICAL SPINE WITHOUT CONTRAST TECHNIQUE: Multidetector CT imaging of the cervical spine was performed without intravenous contrast. Multiplanar CT image reconstructions were also generated. RADIATION DOSE REDUCTION: This exam was performed according to the departmental dose-optimization program which includes automated exposure control, adjustment of the mA and/or kV according to patient size and/or use of iterative reconstruction technique. COMPARISON:  None Available. FINDINGS: Alignment: Facet joints are aligned without dislocation or traumatic listhesis. Dens and lateral masses are aligned. Degenerative facet mediated grade 1 anterolisthesis of C4 on C5. Skull base and vertebrae: No acute fracture. No primary bone lesion or focal pathologic process. Soft tissues and spinal canal: No prevertebral fluid or swelling. No visible canal hematoma. Disc levels: Degenerative disc disease of the C4-5 through C6-7 levels. Multilevel facet arthropathy. The left C4-5 facet joint is fused. Upper chest: Negative. Other: Bilateral carotid atherosclerosis. Small calcified left anterior chain cervical lymph nodes. IMPRESSION: 1. No acute fracture or traumatic listhesis of the cervical spine. 2. Multilevel degenerative  disc disease and facet arthropathy. Electronically Signed   By: Davina Poke D.O.   On: 07/05/2022 14:04   DG Chest Port 1 View  Result Date: 07/05/2022 CLINICAL DATA:  Vomiting EXAM: PORTABLE CHEST 1 VIEW COMPARISON:  CXR 05/04/22 FINDINGS: No pleural effusion. No pneumothorax. There are patchy opacities in the right mid and lower lung, as well as the left lung base. These are worrisome for multifocal infection, possibly related to aspiration. No displaced rib fractures. Visualized upper abdomen is notable for gaseous distention of the colon at  the splenic flexure. IMPRESSION: Patchy opacities in the right mid and lower lung, as well as the left lung base. These are worrisome for multifocal infection, possibly related to aspiration. Electronically Signed   By: Marin Roberts M.D.   On: 07/05/2022 13:33     PHYSICAL EXAM  Temp:  [97.7 F (36.5 C)-99.5 F (37.5 C)] 97.7 F (36.5 C) (02/19 1108) Pulse Rate:  [61-88] 74 (02/19 1108) Resp:  [16-24] 19 (02/19 1108) BP: (99-174)/(65-129) 99/65 (02/19 1108) SpO2:  [92 %-98 %] 94 % (02/19 1108)  General - Well nourished, well developed, in no apparent distress.  Cardiovascular - irregularly irregular heart rate and rhythm.  Mental Status -  Level of arousal and orientation to person place time and situation were intact Speech is clear and fluent  Cranial Nerves II - XII - II - Visual field intact OU. [name], IV, VI - Extraocular movements intact. V - Facial sensation intact bilaterally. VII - mild left nasolabial fold flattening VIII - Hearing & vestibular intact bilaterally. X - Palate elevates symmetrically. XI - Chin turning & shoulder shrug intact bilaterally. XII - Tongue protrusion intact.  Motor Strength - The patient's strength was normal in all extremities and pronator drift was absent.  Bulk was normal and fasciculations were absent.   Motor Tone - Muscle tone was assessed at the neck and appendages and was normal.  Sensory -  Light touch was assessed and were symmetrical.    Coordination - The patient had normal movements in the hands and feet with no ataxia or dysmetria.  Tremor was absent.  Gait and Station - deferred.   ASSESSMENT/PLAN [name] is a 82 y.o. male with history of  HTN, afib off coumadin, dementia, bipolar disorder, throat cancer status post chemotherapy and radiation admitted for right-sided weakness, slurred speech, confusion and aphasia.  tPA was given at outside hospital.  MRI reveals no stroke, patient likely had TIA or stroke aborted by TNK.  Stroke like symptoms status post [facility] TIA likely due to A-fib not on [facility] CT at outside hospital no acute finding MRI no acute infarct MRA negative Carotid Doppler unremarkable 2D Echo EF 55 to 60% on 07/06/2022 LDL 40 HgbA1c 5.6 SCDs for VTE prophylaxis aspirin 81 mg daily prior to admission, now on aspirin 81 mg daily. Discussed with Dr. Julianne Rice will start Eliquis which has approved by GI Reached out to GI, awaiting their input into restarting [facility]  Ongoing aggressive stroke risk factor management Therapy recommendations: [facility] PT Disposition: Pending  Chronic A-fib Follow-up with Dr. Geraldo Pitter at [facility] Was on Coumadin before 07/05/2022 admission for pneumonia, AKI.  Before that admission, patient was found down initially in the pool of dark vomit, concerning for GI bleeding.  Hemoglobin stable.  GI evaluated patient and did not plan for any endoscopic evaluation warfarin was discontinued in the setting of presumed high risk bleeding.  Metoprolol continued and amiodarone started.  Plan to follow-up with Dr. Geraldo Pitter as outpatient to consider if Watchman device needed. Currently on aspirin 81.  Patient has no further GI bleeding. Discussed with cardiology Dr. Julianne Rice, will start eliquis after cleared by GI.  Bipolar disorder  Safety concerns pt is stockpiling weapons, trying to jump out of cars/run cars into traffic, and wife  has had to wrestle guns away from him within the last week  He has had ECT in the past that has worked well for him  Psychiatry consulted and recommended IVC and ECT  Pending  discharge to [facility] facility Continue Seroquel and Lamictal   Hypertension Stable BP goal less than 180/105 Long term BP goal normotensive  Hyperlipidemia Home meds: Crestor 10 LDL 40, goal < 70 Now on Crestor 10, no high intensity statin given LDL at goal and advanced age Continue statin at discharge  Other Stroke Risk Factors Advanced age  Other Active Problems Dementia Throat cancer status post chemotherapy and radiation  Hospital day # 3  Patient seen by NP and then by MD, MD to edit note is needed  Beulah Gandy DNP, ACNPC-AG  [facility]  ATTENDING NOTE: I reviewed above note and agree with the assessment and plan. Pt was seen and examined.   Wife at the bedside. Pt lying in bed, no acute event overnight, neuro intact. GI has cleared him for [facility] and will start eliquis today. Wife has concerns of pt safety and behavior, psych consulted and recommended IVC and possible ECT at [facility] facility. Pt is ready for discharge pending facility acceptance.   For detailed assessment and plan, please refer to above/below as I have made changes wherever appropriate.   Rosalin Hawking, MD PhD Stroke Neurology 07/30/2022 6:39 PM    To contact Stroke Continuity provider, please refer to [email or web address] After hours, contact General Neurology
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
## 2022-07-30 NOTE — Plan of Care (Signed)
  Problem: Education: Goal: Knowledge of General Education information will improve Description: Including pain rating scale, medication(s)/side effects and non-pharmacologic comfort measures Outcome: Progressing   Problem: Health Behavior/Discharge Planning: Goal: Ability to manage health-related needs will improve Outcome: Progressing   Problem: Clinical Measurements: Goal: Ability to maintain clinical measurements within normal limits will improve Outcome: Progressing Goal: Will remain free from infection Outcome: Progressing Goal: Diagnostic test results will improve Outcome: Progressing Goal: Respiratory complications will improve Outcome: Progressing Goal: Cardiovascular complication will be avoided Outcome: Progressing   Problem: Activity: Goal: Risk for activity intolerance will decrease Outcome: Progressing   Problem: Nutrition: Goal: Adequate nutrition will be maintained Outcome: Progressing   Problem: Coping: Goal: Level of anxiety will decrease Outcome: Progressing   Problem: Elimination: Goal: Will not experience complications related to bowel motility Outcome: Progressing Goal: Will not experience complications related to urinary retention Outcome: Progressing   Problem: Pain Managment: Goal: General experience of comfort will improve Outcome: Progressing   Problem: Safety: Goal: Ability to remain free from injury will improve Outcome: Progressing   Problem: Skin Integrity: Goal: Risk for impaired skin integrity will decrease Outcome: Progressing   Problem: Education: Goal: Knowledge of disease or condition will improve Outcome: Progressing Goal: Knowledge of secondary prevention will improve (MUST DOCUMENT ALL) Outcome: Progressing Goal: Knowledge of patient specific risk factors will improve Elta [place] N/A or DELETE if not current risk factor) Outcome: Progressing   Problem: Ischemic Stroke/TIA Tissue Perfusion: Goal: Complications of ischemic  stroke/TIA will be minimized Outcome: Progressing   Problem: Coping: Goal: Will verbalize positive feelings about self Outcome: Progressing Goal: Will identify appropriate support needs Outcome: Progressing   Problem: Health Behavior/Discharge Planning: Goal: Ability to manage health-related needs will improve Outcome: Progressing Goal: Goals will be collaboratively established with patient/family Outcome: Progressing   Problem: Self-Care: Goal: Ability to participate in self-care as condition permits will improve Outcome: Progressing Goal: Verbalization of feelings and concerns over difficulty with self-care will improve Outcome: Progressing Goal: Ability to communicate needs accurately will improve Outcome: Progressing   Problem: Nutrition: Goal: Risk of aspiration will decrease Outcome: Progressing Goal: Dietary intake will improve Outcome: Progressing

## 2022-07-30 NOTE — Progress Notes (Signed)
Physical Therapy Treatment Patient Details Name: [name] MRN: [id number] DOB: 1941/03/25 Today's Date: 07/30/2022   History of Present Illness 82 y.o. M admitted on 07/27/22 from [facility] following an acute onset of R sided weakness, slurred speech, and confusion. Pt received TPN at 1012a on 07/27/22. CT and MRI are negative. PMH includes: dementia, HTN, afib on warfarin, bipolar disorder, cataracts, CAD, MI, throat cancer s/p chemo and radiation.    PT Comments    Pt progressing with mobility. HR maintained between mid 90's and 108 bpm with ambulation and SPO2 95% on RA. Pt ambulated >200' with RW and min to min guard A. Has difficulty changing gait speeds and occasionally does not attend to environment, running into objects in hallway, but self corrected without LOB. Practiced stairs with rail. He does not have rail at home but does have handhold. Continue to recommend HHPT at d/c. PT will continue to follow.    Recommendations for follow up therapy are one component of a multi-disciplinary discharge planning process, led by the attending physician.  Recommendations may be updated based on patient status, additional functional criteria and insurance authorization.  Follow Up Recommendations  Home health PT     Assistance Recommended at Discharge Frequent or constant Supervision/Assistance  Patient can return home with the following A little help with walking and/or transfers;Assist for transportation;Help with stairs or ramp for entrance;A little help with bathing/dressing/bathroom   Equipment Recommendations  None recommended by PT    Recommendations for Other Services       Precautions / Restrictions Precautions Precautions: Fall Precaution Comments: reports hx of fall involving a tractor he was working on where he broke both arms last year Restrictions Weight Bearing Restrictions: No     Mobility  Bed Mobility Overal bed mobility: Needs Assistance Bed  Mobility: Supine to Sit     Supine to sit: Supervision     General bed mobility comments: pt able to come to EOB (HOB 20 deg) without physical assist as well as remove covers and scoot out. Increased time needed to complete task    Transfers Overall transfer level: Needs assistance Equipment used: Rolling walker (2 wheels) Transfers: Sit to/from Stand Sit to Stand: Min guard           General transfer comment: min guard from bed and recliner, vc's for hand placement    Ambulation/Gait Ambulation/Gait assistance: Min assist Gait Distance (Feet): 200 Feet Assistive device: Rolling walker (2 wheels) Gait Pattern/deviations: Step-through pattern, Decreased stride length Gait velocity: decreased Gait velocity interpretation: <1.8 ft/sec, indicate of risk for recurrent falls (has difficulty changing gait speed)   General Gait Details: HR between 90's and 108 bpm with ambulation, SPO2 95% on RA. Pt with decreased gait speed and at times does not attend to obstacles in environment. Was able to self correct after making contact with wheels of RW   Stairs Stairs: Yes Stairs assistance: Min guard Stair Management: Two rails, Alternating pattern, Forwards Number of Stairs: 3 General stair comments: increased effort noted stepping up, pt was able to maintain knee control with descent. He does not have a rail at home but does have a handhold at stairs into kitchen from [employer] Rankin (Stroke Patients Only) Modified Rankin (Stroke Patients Only) Pre-Morbid Rankin Score: No symptoms Modified Rankin: Moderate disability     Balance Overall balance assessment: Needs assistance Sitting-balance support: Feet supported Sitting balance-Leahy Scale: Good     Standing balance  support: Bilateral upper extremity supported, During functional activity, No upper extremity supported Standing balance-Leahy Scale: Fair                 High Level  Balance Comments: standing balance activities: SL stance with UE support, [place] without UE support, eyes opened and closed.            Cognition Arousal/Alertness: Awake/alert Behavior During Therapy: WFL for tasks assessed/performed Overall Cognitive Status: History of cognitive impairments - at baseline                                 General Comments: Pt with diagnosis of Dementia; Can recall events of recent months, not as good with past few days        Exercises      General Comments        Pertinent Vitals/Pain Pain Assessment Pain Assessment: No/denies pain    Home Living                          Prior Function            PT Goals (current goals can now be found in the care plan section) Acute Rehab PT Goals Patient Stated Goal: none stated PT Goal Formulation: Patient unable to participate in goal setting Time For Goal Achievement: 08/11/22 Potential to Achieve Goals: Good Progress towards PT goals: Progressing toward goals    Frequency    Min 3X/week      PT Plan Current plan remains appropriate    Co-evaluation              AM-PAC PT "6 Clicks" Mobility   Outcome Measure  Help needed turning from your back to your side while in a flat bed without using bedrails?: A Little Help needed moving from lying on your back to sitting on the side of a flat bed without using bedrails?: A Little Help needed moving to and from a bed to a chair (including a wheelchair)?: A Little Help needed standing up from a chair using your arms (e.g., wheelchair or bedside chair)?: A Little Help needed to walk in hospital room?: A Little Help needed climbing 3-5 steps with a railing? : A Little 6 Click Score: 18    End of Session Equipment Utilized During Treatment: Gait belt Activity Tolerance: Patient tolerated treatment well Patient left: in chair;with chair alarm set;with call bell/phone within reach Nurse Communication:  Mobility status PT Visit Diagnosis: Muscle weakness (generalized) (M62.81);Other abnormalities of gait and mobility (R26.89)     Time: [id number] PT Time Calculation (min) (ACUTE ONLY): 35 min  Charges:  $Gait Training: 23-37 mins                     [place] chat preferred Office [place] 07/30/2022, 12:12 PM

## 2022-07-30 NOTE — Progress Notes (Signed)
CSW notified by psychiatry that patient needs to be under IVC. IVC completed, notarized, and faxed to [facility]. CSW asked for patient to be served.  Patient under active IVC. IVC expires 08/06/22.  Laveda Abbe, [place] Clinical Social Worker [phone]

## 2022-07-30 NOTE — Plan of Care (Signed)
  Problem: Education: Goal: Knowledge of General Education information will improve Description: Including pain rating scale, medication(s)/side effects and non-pharmacologic comfort measures Outcome: Progressing   Problem: Activity: Goal: Risk for activity intolerance will decrease Outcome: Progressing   Problem: Pain Managment: Goal: General experience of comfort will improve Outcome: Progressing   Problem: Skin Integrity: Goal: Risk for impaired skin integrity will decrease Outcome: Progressing

## 2022-07-30 NOTE — Care Management Important Message (Signed)
Important Message  Patient Details  Name: [name] MRN: [id number] Date of Birth: 1940-12-19   Medicare Important Message Given:  Yes     Stewart Pimenta [address] 07/30/2022, 3:23 PM

## 2022-07-31 ENCOUNTER — Other Ambulatory Visit: Payer: Self-pay

## 2022-07-31 ENCOUNTER — Encounter (HOSPITAL_COMMUNITY): Payer: Self-pay | Admitting: Neurology

## 2022-07-31 ENCOUNTER — Other Ambulatory Visit: Payer: Self-pay | Admitting: Cardiology

## 2022-07-31 DIAGNOSIS — I482 Chronic atrial fibrillation, unspecified: Secondary | ICD-10-CM | POA: Diagnosis not present

## 2022-07-31 DIAGNOSIS — I480 Paroxysmal atrial fibrillation: Secondary | ICD-10-CM

## 2022-07-31 DIAGNOSIS — F314 Bipolar disorder, current episode depressed, severe, without psychotic features: Secondary | ICD-10-CM

## 2022-07-31 DIAGNOSIS — Z9282 Status post administration of tPA (rtPA) in a different facility within the last 24 hours prior to admission to current facility: Secondary | ICD-10-CM | POA: Diagnosis not present

## 2022-07-31 DIAGNOSIS — Z01810 Encounter for preprocedural cardiovascular examination: Secondary | ICD-10-CM

## 2022-07-31 DIAGNOSIS — G459 Transient cerebral ischemic attack, unspecified: Secondary | ICD-10-CM | POA: Diagnosis not present

## 2022-07-31 HISTORY — DX: Bipolar disorder, current episode depressed, severe, without psychotic features: F31.4

## 2022-07-31 LAB — CBC
HCT: 41.1 % (ref 39.0–52.0)
Hemoglobin: 12.9 g/dL — ABNORMAL LOW (ref 13.0–17.0)
MCH: 28.2 pg (ref 26.0–34.0)
MCHC: 31.4 g/dL (ref 30.0–36.0)
MCV: 89.9 fL (ref 80.0–100.0)
Platelets: 188 10*3/uL (ref 150–400)
RBC: 4.57 MIL/uL (ref 4.22–5.81)
RDW: 15.9 % — ABNORMAL HIGH (ref 11.5–15.5)
WBC: 5.2 10*3/uL (ref 4.0–10.5)
nRBC: 0 % (ref 0.0–0.2)

## 2022-07-31 LAB — BASIC METABOLIC PANEL
Anion gap: 9 (ref 5–15)
BUN: 11 mg/dL (ref 8–23)
CO2: 27 mmol/L (ref 22–32)
Calcium: 9.3 mg/dL (ref 8.9–10.3)
Chloride: 104 mmol/L (ref 98–111)
Creatinine, Ser: 1.84 mg/dL — ABNORMAL HIGH (ref 0.61–1.24)
GFR, Estimated: 37 mL/min — ABNORMAL LOW (ref >60)
Glucose, Bld: 93 mg/dL (ref 70–99)
Potassium: 4.2 mmol/L (ref 3.5–5.1)
Sodium: 140 mmol/L (ref 135–145)

## 2022-07-31 LAB — VITAMIN D 25 HYDROXY (VIT D DEFICIENCY, FRACTURES): Vit D, 25-Hydroxy: 78.31 ng/mL (ref 30–100)

## 2022-07-31 LAB — FOLATE: Folate: 18.1 ng/mL (ref >5.9)

## 2022-07-31 LAB — VITAMIN B12: Vitamin B-12: 704 pg/mL (ref 180–914)

## 2022-07-31 LAB — T4, FREE: Free T4: 0.98 ng/dL (ref 0.61–1.12)

## 2022-07-31 MED ORDER — SODIUM CHLORIDE 0.9 % IV SOLN: INTRAVENOUS | Status: AC

## 2022-07-31 MED ORDER — SODIUM CHLORIDE 0.9 % IV SOLN: INTRAVENOUS | Status: DC

## 2022-07-31 MED ORDER — THIAMINE MONONITRATE 100 MG PO TABS
100.0000 mg | ORAL_TABLET | Freq: Every day | ORAL | Status: DC
  Administered 2022-07-31 – 2022-08-02 (×3): 100 mg via ORAL
  Filled 2022-07-31 (×3): qty 1
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
## 2022-07-31 NOTE — Progress Notes (Addendum)
STROKE TEAM PROGRESS NOTE   SUBJECTIVE (INTERVAL HISTORY) Patient's wife is at the bedside. Patient is sitting up at bedside recliner, in no acute distress. Patient is agitated, hostile, and irritable throughout assessment today.  Neurological exam is stable and unchanged. VSS  GI cleared patient for Eliquis initiation.  Eliquis was started 2/19. Psychiatry on board, patient under IVC orders.  Requested cardiology evaluation for ECT appropriateness per psychiatry request.   Patient plan for transfer to behavioral health hospital for further evaluation and possible ECT.  OBJECTIVE Temp:  [98.1 F (36.7 C)-98.8 F (37.1 C)] 98.6 F (37 C) (02/20 0326) Pulse Rate:  [62-88] 88 (02/20 0938) Cardiac Rhythm: Normal sinus rhythm (02/20 1041) Resp:  [15-18] 18 (02/20 0938) BP: (87-151)/(57-89) 139/86 (02/20 0938) SpO2:  [91 %-99 %] 93 % (02/20 0938)  Recent Labs  Lab 07/29/22 0336 07/29/22 0752 07/29/22 1112 07/29/22 1539 07/29/22 1944  GLUCAP 88 85 94 103* 113*    Recent Labs  Lab 07/28/22 0117 07/29/22 0646 07/30/22 0337 07/31/22 0334  NA 141 140 138 140  K 3.9 4.0 3.9 4.2  CL 110 108 103 104  CO2 25 23 26 27  $ GLUCOSE 88 95 93 93  BUN 13 10 10 11  $ CREATININE 1.58* 1.52* 1.72* 1.84*  CALCIUM 8.3* 8.9 9.1 9.3    No results for input(s): "AST", "ALT", "ALKPHOS", "BILITOT", "PROT", "ALBUMIN" in the last 168 hours. Recent Labs  Lab 07/28/22 0117 07/29/22 0646 07/30/22 0337 07/31/22 0334  WBC 4.4 4.3 4.8 5.2  HGB 12.1* 11.8* 12.6* 12.9*  HCT 37.2* 37.1* 38.1* 41.1  MCV 88.6 90.0 86.4 89.9  PLT 195 172 188 188    No results for input(s): "CKTOTAL", "CKMB", "CKMBINDEX", "TROPONINI" in the last 168 hours. No results for input(s): "LABPROT", "INR" in the last 72 hours. No results for input(s): "COLORURINE", "LABSPEC", "PHURINE", "GLUCOSEU", "HGBUR", "BILIRUBINUR", "KETONESUR", "PROTEINUR", "UROBILINOGEN", "NITRITE", "LEUKOCYTESUR" in the last 72 hours.  Invalid  input(s): "APPERANCEUR"     Component Value Date/Time   CHOL 104 07/28/2022 0117   CHOL 173 07/25/2021 0923   TRIG 128 07/28/2022 0117   HDL 38 (L) 07/28/2022 0117   HDL 44 07/25/2021 0923   CHOLHDL 2.7 07/28/2022 0117   VLDL 26 07/28/2022 0117   LDLCALC 40 07/28/2022 0117   LDLCALC 86 07/25/2021 0923   Lab Results  Component Value Date   HGBA1C 5.6 07/27/2022   No results found for: "LABOPIA", "COCAINSCRNUR", "LABBENZ", "AMPHETMU", "THCU", "LABBARB"  No results for input(s): "ETH" in the last 168 hours.  I have personally reviewed the radiological images below and agree with the radiology interpretations.  VAS US CAROTID  Result Date: 07/29/2022 Carotid Arterial Duplex Study Patient Name:  [name]  Date of Exam:   07/28/2022 Medical Rec #: [id number]               Accession #:    [id number] Date of Birth: 30-Jul-1940                Patient Gender: M Patient Age:   70 years Exam Location:  [facility] Procedure:      VAS US CAROTID Referring Phys: Rosalin Hawking --------------------------------------------------------------------------------  Indications:       CVA. Risk Factors:      Hypertension. Limitations        Today's exam was limited due to the patient's respiratory  variation and patient positioning. Comparison Study:  No prior studies. Performing Technologist: Oliver Hum RVT  Examination Guidelines: A complete evaluation includes B-mode imaging, spectral Doppler, color Doppler, and power Doppler as needed of all accessible portions of each vessel. Bilateral testing is considered an integral part of a complete examination. Limited examinations for reoccurring indications may be performed as noted.  Right Carotid Findings: +----------+--------+--------+--------+--------------------------+--------+           PSV cm/sEDV cm/sStenosisPlaque Description        Comments +----------+--------+--------+--------+--------------------------+--------+  CCA Prox  73      19              smooth and heterogenous            +----------+--------+--------+--------+--------------------------+--------+ CCA Distal64      19              smooth and heterogenous            +----------+--------+--------+--------+--------------------------+--------+ ICA Prox  93      30              irregular and heterogenous         +----------+--------+--------+--------+--------------------------+--------+ ICA Distal92      30                                                 +----------+--------+--------+--------+--------------------------+--------+ ECA       127     17                                                 +----------+--------+--------+--------+--------------------------+--------+ +----------+--------+-------+--------+-------------------+           PSV cm/sEDV cmsDescribeArm Pressure (mmHG) +----------+--------+-------+--------+-------------------+ [id number]                                         +----------+--------+-------+--------+-------------------+ +---------+--------+--+--------+--+---------+ VertebralPSV cm/s39EDV cm/s11Antegrade +---------+--------+--+--------+--+---------+  Left Carotid Findings: +----------+--------+--------+--------+--------------------------+--------+           PSV cm/sEDV cm/sStenosisPlaque Description        Comments +----------+--------+--------+--------+--------------------------+--------+ CCA Prox  106     15              smooth and heterogenous            +----------+--------+--------+--------+--------------------------+--------+ CCA Distal79      25              irregular and heterogenous         +----------+--------+--------+--------+--------------------------+--------+ ICA Prox  62      15              irregular and heterogenous         +----------+--------+--------+--------+--------------------------+--------+ ICA Distal86      30                                         tortuous +----------+--------+--------+--------+--------------------------+--------+ ECA       106     16                                                 +----------+--------+--------+--------+--------------------------+--------+ +----------+--------+--------+--------+-------------------+  PSV cm/sEDV cm/sDescribeArm Pressure (mmHG) +----------+--------+--------+--------+-------------------+ Subclavian118                                         +----------+--------+--------+--------+-------------------+ +---------+--------+--+--------+--+---------+ VertebralPSV cm/s64EDV cm/s21Antegrade +---------+--------+--+--------+--+---------+   Summary: Right Carotid: Velocities in the right ICA are consistent with a 1-39% stenosis. Left Carotid: Velocities in the left ICA are consistent with a 1-39% stenosis. Vertebrals: Bilateral vertebral arteries demonstrate antegrade flow. *See table(s) above for measurements and observations.  Electronically signed by Antony Contras MD on 07/29/2022 at 4:05:30 PM.    Final    MR BRAIN WO CONTRAST  Result Date: 07/28/2022 CLINICAL DATA:  Stroke follow-up EXAM: MRI HEAD WITHOUT CONTRAST MRA HEAD WITHOUT CONTRAST TECHNIQUE: Multiplanar, multi-echo pulse sequences of the brain and surrounding structures were acquired without intravenous contrast. Angiographic images of the Circle of Willis were acquired using MRA technique without intravenous contrast. COMPARISON:  07/05/2022 FINDINGS: MRI HEAD FINDINGS Brain: No acute infarction, hemorrhage, hydrocephalus, extra-axial collection or mass lesion. Generalized brain atrophy. Mild chronic small vessel ischemia in the cerebral white matter for age. Vascular: See below Skull and upper cervical spine: Normal marrow signal Sinuses/Orbits: Unremarkable Other: Intermittent significant motion artifact MRA HEAD FINDINGS Anterior circulation: Accounting for artifact the major vessels are smoothly  contoured and widely patent. No branch occlusion, beading, or aneurysm. Posterior circulation: Left dominant vertebral artery. The basilar artery is smoothly contoured and widely patent. No branch occlusion, beading, or aneurysm. IMPRESSION: Brain MRI: 1. No acute finding including infarct. 2. Generalized atrophy. 3. Moderate motion artifact. Intracranial MRA: Negative. Electronically Signed   By: Jorje Guild M.D.   On: 07/28/2022 12:12   MR ANGIO HEAD WO CONTRAST  Result Date: 07/28/2022 CLINICAL DATA:  Stroke follow-up EXAM: MRI HEAD WITHOUT CONTRAST MRA HEAD WITHOUT CONTRAST TECHNIQUE: Multiplanar, multi-echo pulse sequences of the brain and surrounding structures were acquired without intravenous contrast. Angiographic images of the Circle of Willis were acquired using MRA technique without intravenous contrast. COMPARISON:  07/05/2022 FINDINGS: MRI HEAD FINDINGS Brain: No acute infarction, hemorrhage, hydrocephalus, extra-axial collection or mass lesion. Generalized brain atrophy. Mild chronic small vessel ischemia in the cerebral white matter for age. Vascular: See below Skull and upper cervical spine: Normal marrow signal Sinuses/Orbits: Unremarkable Other: Intermittent significant motion artifact MRA HEAD FINDINGS Anterior circulation: Accounting for artifact the major vessels are smoothly contoured and widely patent. No branch occlusion, beading, or aneurysm. Posterior circulation: Left dominant vertebral artery. The basilar artery is smoothly contoured and widely patent. No branch occlusion, beading, or aneurysm. IMPRESSION: Brain MRI: 1. No acute finding including infarct. 2. Generalized atrophy. 3. Moderate motion artifact. Intracranial MRA: Negative. Electronically Signed   By: Jorje Guild M.D.   On: 07/28/2022 12:12   CT HEAD WO CONTRAST  Result Date: 07/28/2022 CLINICAL DATA:  Provided history: Stroke, follow-up. EXAM: CT HEAD WITHOUT CONTRAST TECHNIQUE: Contiguous axial images were  obtained from the base of the skull through the vertex without intravenous contrast. RADIATION DOSE REDUCTION: This exam was performed according to the departmental dose-optimization program which includes automated exposure control, adjustment of the mA and/or kV according to patient size and/or use of iterative reconstruction technique. COMPARISON:  Noncontrast head CT and CT angiogram head/neck 07/27/2022. FINDINGS: Motion degraded examination, particularly at the level the skull base. Brain: Generalized cerebral atrophy. Mild patchy and ill-defined hypoattenuation within the cerebral white matter, nonspecific but compatible with chronic small ischemic disease. There  is no acute intracranial hemorrhage. No demarcated cortical infarct. No extra-axial fluid collection. No evidence of an intracranial mass. No midline shift. Vascular: No hyperdense vessel.  Atherosclerotic calcifications. Skull: No fracture or aggressive osseous lesion. Sinuses/Orbits: No mass or acute finding within the imaged orbits. 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 IMPRESSION: 1. Motion degraded examination, particularly at the level the skull base. 2.  No evidence of an acute intracranial abnormality. 3. Parenchymal atrophy and chronic small ischemic disease. Electronically Signed   By: Kellie Simmering D.O.   On: 07/28/2022 10:45   ECHOCARDIOGRAM COMPLETE  Result Date: 07/06/2022    ECHOCARDIOGRAM REPORT   Patient Name:   [name] Date of Exam: 07/06/2022 Medical Rec #:  [id number]              Height:       67.0 in Accession #:    [id number]             Weight:       209.4 lb Date of Birth:  04/10/1941               BSA:          2.062 m Patient Age:    61 years               BP:           83/56 mmHg Patient Gender: M                      HR:           102 bpm. Exam Location:  Inpatient Procedure: 2D Echo, Color Doppler and Cardiac Doppler Indications:    elevated troponin  History:        Patient has prior history of  Echocardiogram examinations.                 Previous Myocardial Infarction and CAD, Arrythmias:Atrial                 Fibrillation and RBBB; Risk Factors:Hypertension.  Sonographer:    Phineas Douglas Referring Phys: [id number] [place]  1. Left ventricular ejection fraction, by estimation, is 55 to 60%. The left ventricle has normal function. The left ventricle has no regional wall motion abnormalities. There is moderate left ventricular hypertrophy. Left ventricular diastolic parameters are indeterminate.  2. Right ventricular systolic function is normal. The right ventricular size is normal.  3. Left atrial size was moderately dilated.  4. The mitral valve is abnormal. Mild mitral valve regurgitation. No evidence of mitral stenosis.  5. Fused right and left cusp suspect there is at least mild stenosis Consider having patient return for dedicated CW doppler interrogation of AV. The aortic valve is normal in structure. There is moderate calcification of the aortic valve. There is moderate thickening of the aortic valve. Aortic valve regurgitation is mild. Aortic valve sclerosis/calcification is present, without any evidence of aortic stenosis.  6. The inferior vena cava is normal in size with greater than 50% respiratory variability, suggesting right atrial pressure of 3 mmHg. FINDINGS  Left Ventricle: Left ventricular ejection fraction, by estimation, is 55 to 60%. The left ventricle has normal function. The left ventricle has no regional wall motion abnormalities. The left ventricular internal cavity size was normal in size. There is  moderate left ventricular hypertrophy. Left ventricular diastolic parameters are indeterminate. Right Ventricle: The right ventricular size is normal. No increase in right  ventricular wall thickness. Right ventricular systolic function is normal. Left Atrium: Left atrial size was moderately dilated. Right Atrium: Right atrial size was normal in size. Pericardium: There  is no evidence of pericardial effusion. Mitral Valve: The mitral valve is abnormal. There is mild thickening of the mitral valve leaflet(s). There is mild calcification of the mitral valve leaflet(s). Mild mitral annular calcification. Mild mitral valve regurgitation. No evidence of mitral valve stenosis. Tricuspid Valve: The tricuspid valve is normal in structure. Tricuspid valve regurgitation is mild . No evidence of tricuspid stenosis. Aortic Valve: Fused right and left cusp suspect there is at least mild stenosis Consider having patient return for dedicated CW doppler interrogation of AV. The aortic valve is normal in structure. There is moderate calcification of the aortic valve. There is moderate thickening of the aortic valve. Aortic valve regurgitation is mild. Aortic regurgitation PHT measures 443 msec. Aortic valve sclerosis/calcification is present, without any evidence of aortic stenosis. Pulmonic Valve: The pulmonic valve was normal in structure. Pulmonic valve regurgitation is not visualized. No evidence of pulmonic stenosis. Aorta: The aortic root is normal in size and structure. Venous: The inferior vena cava is normal in size with greater than 50% respiratory variability, suggesting right atrial pressure of 3 mmHg. IAS/Shunts: No atrial level shunt detected by color flow Doppler.  LEFT VENTRICLE PLAX 2D LVIDd:         4.60 cm      Diastology LVIDs:         3.10 cm      LV e' medial:    9.14 cm/s LV PW:         1.30 cm      LV E/e' medial:  7.3 LV IVS:        1.50 cm      LV e' lateral:   8.70 cm/s LVOT diam:     2.10 cm      LV E/e' lateral: 7.6 LV SV:         43 LV SV Index:   21 LVOT Area:     3.46 cm  LV Volumes (MOD) LV vol d, MOD A2C: 124.0 ml LV vol d, MOD A4C: 101.0 ml LV vol s, MOD A2C: 53.7 ml LV vol s, MOD A4C: 42.3 ml LV SV MOD A2C:     70.3 ml LV SV MOD A4C:     101.0 ml LV SV MOD BP:      71.4 ml RIGHT VENTRICLE             IVC RV Basal diam:  3.80 cm     IVC diam: 2.60 cm RV S prime:      10.10 cm/s TAPSE (M-mode): 1.5 cm LEFT ATRIUM             Index        RIGHT ATRIUM           Index LA diam:        4.30 cm 2.09 cm/m   RA Area:     20.90 cm LA Vol (A2C):   96.7 ml 46.89 ml/m  RA Volume:   61.00 ml  29.58 ml/m LA Vol (A4C):   99.2 ml 48.10 ml/m LA Biplane Vol: 97.7 ml 47.38 ml/m  AORTIC VALVE LVOT Vmax:   78.30 cm/s LVOT Vmean:  48.700 cm/s LVOT VTI:    0.125 m AI PHT:      443 msec  AORTA Ao Root diam: 3.40 cm Ao Asc diam:  3.20  cm MITRAL VALVE MV Area (PHT): 5.16 cm    SHUNTS MV Decel Time: 147 msec    Systemic VTI:  0.12 m MV E velocity: 66.40 cm/s  Systemic Diam: 2.10 cm Jenkins Rouge MD Electronically signed by Jenkins Rouge MD Signature Date/Time: 07/06/2022/10:06:05 AM    Final    MR BRAIN WO CONTRAST  Result Date: 07/05/2022 CLINICAL DATA:  Neuro deficit, acute, stroke suspected Mental status change, unknown cause EXAM: MRI HEAD WITHOUT CONTRAST TECHNIQUE: Multiplanar, multiecho pulse sequences of the brain and surrounding structures were obtained without intravenous contrast. COMPARISON:  Same day CT head. FINDINGS: Brain: No acute infarction, hemorrhage, hydrocephalus, extra-axial collection or mass lesion. Chronic microvascular ischemic disease. Vascular: Major arterial flow voids are maintained at the skull base. Skull and upper cervical spine: Normal marrow signal. Sinuses/Orbits: Negative. Other: No sizable mastoid effusions. IMPRESSION: No evidence of acute intracranial abnormality. Electronically Signed   By: Margaretha Sheffield M.D.   On: 07/05/2022 18:58   CT Head Wo Contrast  Result Date: 07/05/2022 CLINICAL DATA:  Head trauma, minor (Age >= 65y) EXAM: CT HEAD WITHOUT CONTRAST TECHNIQUE: Contiguous axial images were obtained from the base of the skull through the vertex without intravenous contrast. RADIATION DOSE REDUCTION: This exam was performed according to the departmental dose-optimization program which includes automated exposure control, adjustment of the mA  and/or kV according to patient size and/or use of iterative reconstruction technique. COMPARISON:  May 04, 2022. FINDINGS: Brain: No evidence of acute infarction, hemorrhage, hydrocephalus, extra-axial collection or mass lesion/mass effect. Patchy white matter hypodensities nonspecific but compatible with chronic microvascular ischemic disease. Vascular: No hyperdense vessel. Skull: No acute fracture. Sinuses/Orbits: Anterior right ethmoid air cell osteoma. No acute orbital findings. Other: No mastoid effusions IMPRESSION: No evidence of acute intracranial abnormality. Electronically Signed   By: Margaretha Sheffield M.D.   On: 07/05/2022 14:33   CT Cervical Spine Wo Contrast  Result Date: 07/05/2022 CLINICAL DATA:  Neck trauma EXAM: CT CERVICAL SPINE WITHOUT CONTRAST TECHNIQUE: Multidetector CT imaging of the cervical spine was performed without intravenous contrast. Multiplanar CT image reconstructions were also generated. RADIATION DOSE REDUCTION: This exam was performed according to the departmental dose-optimization program which includes automated exposure control, adjustment of the mA and/or kV according to patient size and/or use of iterative reconstruction technique. COMPARISON:  None Available. FINDINGS: Alignment: Facet joints are aligned without dislocation or traumatic listhesis. Dens and lateral masses are aligned. Degenerative facet mediated grade 1 anterolisthesis of C4 on C5. Skull base and vertebrae: No acute fracture. No primary bone lesion or focal pathologic process. Soft tissues and spinal canal: No prevertebral fluid or swelling. No visible canal hematoma. Disc levels: Degenerative disc disease of the C4-5 through C6-7 levels. Multilevel facet arthropathy. The left C4-5 facet joint is fused. Upper chest: Negative. Other: Bilateral carotid atherosclerosis. Small calcified left anterior chain cervical lymph nodes. IMPRESSION: 1. No acute fracture or traumatic listhesis of the cervical  spine. 2. Multilevel degenerative disc disease and facet arthropathy. Electronically Signed   By: Davina Poke D.O.   On: 07/05/2022 14:04   DG Chest Port 1 View  Result Date: 07/05/2022 CLINICAL DATA:  Vomiting EXAM: PORTABLE CHEST 1 VIEW COMPARISON:  CXR 05/04/22 FINDINGS: No pleural effusion. No pneumothorax. There are patchy opacities in the right mid and lower lung, as well as the left lung base. These are worrisome for multifocal infection, possibly related to aspiration. No displaced rib fractures. Visualized upper abdomen is notable for gaseous distention of the colon at  the splenic flexure. IMPRESSION: Patchy opacities in the right mid and lower lung, as well as the left lung base. These are worrisome for multifocal infection, possibly related to aspiration. Electronically Signed   By: Marin Roberts M.D.   On: 07/05/2022 13:33    PHYSICAL EXAM  Temp:  [98.1 F (36.7 C)-98.8 F (37.1 C)] 98.6 F (37 C) (02/20 0326) Pulse Rate:  [62-88] 88 (02/20 0938) Resp:  [15-18] 18 (02/20 0938) BP: (87-151)/(57-89) 139/86 (02/20 0938) SpO2:  [91 %-99 %] 93 % (02/20 0938)  General - Well nourished, well developed, in no apparent distress.  Cardiovascular -sinus rhythm on cardiac monitor  Mental Status -  Level of arousal and orientation to person place time and situation were intact Speech is clear and fluent.  Patient is intermittently hostile and irritable throughout assessment.  Cranial Nerves II - XII - II - Visual field intact OU. [name], IV, VI - Extraocular movements intact. V - Facial sensation intact bilaterally. VII - mild left nasolabial fold flattening VIII - Hearing & vestibular intact bilaterally. X - Palate elevates symmetrically. XI - Chin turning & shoulder shrug intact bilaterally. XII - Tongue protrusion intact.  Motor Strength - The patient's strength was normal in all extremities.  Bulk was normal and fasciculations were absent.   Motor Tone - Muscle tone was  assessed at the neck and appendages and was normal.  Sensory - Light touch was assessed and were symmetrical.    Coordination - The patient had normal movements in the hands and feet with no ataxia or dysmetria.  Tremor was absent.  Gait and Station - deferred.  ASSESSMENT/PLAN [name] is a 82 y.o. male with history of  HTN, afib off coumadin, dementia, bipolar disorder, throat cancer status post chemotherapy and radiation admitted for right-sided weakness, slurred speech, confusion and aphasia.  tPA was given at outside hospital.  MRI reveals no stroke, patient likely had TIA or stroke aborted by TNK.  Stroke like symptoms status post TNK TIA likely due to A-fib not on [facility] CT at outside hospital no acute finding MRI no acute infarct MRA negative Carotid Doppler unremarkable 2D Echo EF 55 to 60% on 07/06/2022 LDL 40 HgbA1c 5.6 SCDs for VTE prophylaxis aspirin 81 mg daily prior to admission, now on aspirin 81 mg daily. Discussed with Dr. Julianne Rice will start Eliquis which has approved by GI Ongoing aggressive stroke risk factor management Therapy recommendations: [facility] PT Disposition: Pending  Chronic A-fib Follow-up with Dr. Geraldo Pitter at [facility] Was on Coumadin before 07/05/2022 admission for pneumonia, AKI.  Before that admission, patient was found down initially in the pool of dark vomit, concerning for GI bleeding.  Hemoglobin stable.  GI evaluated patient and did not plan for any endoscopic evaluation warfarin was discontinued in the setting of presumed high risk bleeding.  Metoprolol continued and amiodarone started.  Plan to follow-up with Dr. Geraldo Pitter as outpatient to consider if Watchman device needed. Currently on aspirin 81.  Patient has no further GI bleeding. Discussed with cardiology Dr. Julianne Rice, will start eliquis as patient was cleared by GI  Bipolar disorder  Safety concerns pt is stockpiling weapons, trying to jump out of cars/run cars into traffic,  and wife has had to wrestle guns away from him within the last week  He has had ECT in the past that has worked well for him  Psychiatry consulted and recommended IVC and ECT for which cardiology has evaluated patient as moderate cardiac risk Pending  discharge to [facility] facility Continue Seroquel and Lamictal   Hypothyroidism TSH 28.982, FT4 0.98, FT3 pending On home synthroid, compliant with meds per wife Now on Synthroid 125 Recently started on amiodarone Will curbside cardiology and hospitalist service regarding further management.  Hypertension Stable BP goal less than 180/105 Long term BP goal normotensive  Hyperlipidemia Home meds: Crestor 10 LDL 40, goal < 70 Now on Crestor 10, no high intensity statin given LDL at goal and advanced age Continue statin at discharge  Other Stroke Risk Factors Advanced age  Other Active Problems Dementia Throat cancer status post chemotherapy and radiation AKI - Cre 1.58->1.72->1.84 - on IVF, monitoring in am  Hospital day # 4  Patient seen by NP and then by MD, MD to edit note is needed  -- Anibal Henderson, AGACNP-BC [facility] [phone]  ATTENDING NOTE: I reviewed above note and agree with the assessment and plan. Pt was seen and examined.   Sitter at the bedside. Pt initially sleeping in bed, but easily arousable. Pleasant, neuro stable, asking for going home. Currently psych on board, recommend IVC. Cardiology on board, cleared for ECT procedure. Found to have elevated TSH but normal FT4, pending FT3. On synthroid at home and here, per wife compliant with meds at home. Discussed with pharmacy, pt already on high dose synthroid, with normal FT4, do not feel can increase dose. Since pt has been on amiodarone since last month, will curbside cardiology and hospitalist service. Pending [facility] facility admission. Continue eliquis and statin.   For detailed assessment and plan, please refer to above/below as I have made  changes wherever appropriate.   Rosalin Hawking, MD PhD Stroke Neurology 07/31/2022 10:19 PM   To contact Stroke Continuity provider, please refer to [email or web address] After hours, contact General Neurology

## 2022-07-31 NOTE — Consult Note (Signed)
Cardiology Consultation   Patient ID: [name] MRN: [id number]; DOB: August 06, 1940  Admit date: 07/27/2022 Date of Consult: 07/31/2022  PCP:  [name], MD   [place] Providers Cardiologist:  None        Patient Profile:   [name] is a 82 y.o. male with a hx of paroxysmal atrial fibrillation who is being seen 07/31/2022 for the evaluation of ECT therapy preoperative cardiac risk at the request of Dr. Erlinda Hong.  History of Present Illness:   [name] is a 82 year old male with severe bipolar disorder who in the past has benefited from ECT therapy.  He has paroxysmal atrial fibrillation and had been on Eliquis.  Unfortunately did have a GI bleed but is currently stable.  He was on Coumadin prior to 07/05/2022 admission for pneumonia and AKI.  We were asked to see him to determine candidacy for ECT therapy.  Thankfully, echocardiogram shows normal pump function. Telemetry personally reviewed demonstrates conversion to sinus rhythm.  Previously in atrial fibrillation.  No stroke noted on MRI.  Denies any chest pain fevers chills nausea vomiting shortness of breath.  Wife at bedside.     Past Medical History:  Diagnosis Date   Acquired hypothyroidism [id number]   Acute metabolic encephalopathy [id number]   Acute MI, true posterior wall, subsequent episode of care ([place]) 08/15/2014   EF 44% with mildly reduced LV function  Formatting of this note might be different from the original. EF 44% with mildly reduced LV function   AKI (acute kidney injury) ([place]) 09/20/2014   Anxiety    Aspiration pneumonia ([place]) 07/05/2022   Atrial fibrillation ([place]) 09/28/2019   ablation in past  Formatting of this note might be different from the original. ablation in past   Bipolar disorder [id number])    w/dementia/psychotic episodes   Bradycardia 09/17/2014   Cancer ([place]) 09/28/2019   Prostate Cancer; Throat Cancer  Formatting of this note might be different  from the original. Prostate Cancer; Throat Cancer   Cardiac murmur 09/28/2019   Cataract    Cervical radicular pain 07/16/2019   Cervical spondylosis 06/19/2019   Chest pain with high risk for cardiac etiology 08/19/2014   Chronic depression    Chronic pain due to trauma 07/16/2019   Coffee ground emesis 07/07/2022   Confusion 07/07/2022   Corn of toe 12/11/2018   Coronary artery calcification 07/25/2021   Coronary artery disease 08/10/2014   Degenerative disc disease, cervical 09/29/2018   Degenerative lumbar disc    Dementia without behavioral disturbance ([place]) 07/16/2019   Disease characterized by destruction of skeletal muscle 08/15/2014   Essential hypertension 09/28/2019   Essential tremor 09/21/2014   History of MI (myocardial infarction) 09/14/2014   Hypercholesteremia    Hypertension    Hypotension 09/14/2014   Long term (current) use of anticoagulants 11/14/2021   Malignant neoplasm of prostate ([place])    Malnutrition of moderate degree [id number]   Metabolic encephalopathy [id number]   Mild aortic stenosis 11/26/2019   Mood change 10/12/2019   Myocardial infarction ([place]) 08/10/2014   Nausea and vomiting 08/19/2014   Onychomycosis of left great toe 08/21/2014   Pain of fifth toe 12/11/2018   Paroxysmal atrial fibrillation ([place]) 07/25/2021   Peptic esophagitis    Peripheral neuropathy    Peripheral neuropathy due to chemotherapy ([place]) 09/21/2014   Prolonged Q-T interval on ECG 08/19/2014   Prostate cancer ([place]) 09/28/2019   Reactive depression 10/12/2019   Renal insufficiency 08/17/2014   Right  bundle branch block 09/28/2019   Sepsis ([place]) 05/18/2020   Small fiber neuropathy 12/16/2018   Squamous cell carcinoma of left tonsil (HCC)    Stage 3b chronic kidney disease ([place]) 12/20/2021   Throat cancer ([place])    Thyroid disease    Transaminitis 08/17/2014   Unstable angina ([place]) 09/14/2014   Whiplash injury syndrome, sequela 08/11/2018    Past Surgical History:   Procedure Laterality Date   CARDIAC ELECTROPHYSIOLOGY STUDY AND ABLATION     COLONOSCOPY  04/07/2007   Small colonic polyp status post polypectomy. Pancolonic diverticulosis predominantly in the sigmoid colon. Internal hemorrhoids.    ESOPHAGOGASTRODUODENOSCOPY  06/15/2010   Erosive esophagitis. Status post PEG placment   EYE SURGERY     INGUINAL HERNIA REPAIR     PROSTATE BIOPSY     SEBACEOUS CYST REMOVAL     TONSILLECTOMY       Home Medications:  Prior to Admission medications   Medication Sig Start Date End Date Taking? Authorizing Provider  amiodarone (PACERONE) 200 MG tablet Take 1 tablet (200 mg total) by mouth 2 (two) times daily for 7 days, THEN 1 tablet (200 mg total) daily. Patient taking differently: 200 mg once daily 07/08/22 08/14/22 Yes Atway, Rayann N, DO  aspirin 81 MG chewable tablet Chew 1 tablet (81 mg total) by mouth daily. 07/08/22  Yes Atway, Rayann N, DO  Cholecalciferol (VITAMIN D3) 125 MCG (5000 UT) TABS Take 5,000 Units by mouth daily.   Yes [provider]  famotidine (PEPCID) 20 MG tablet Take 20 mg by mouth daily. 07/23/22  Yes [provider]  lamoTRIgine (LAMICTAL) 100 MG tablet Take 100 mg by mouth at bedtime. 03/23/22  Yes [provider]  levothyroxine (SYNTHROID) 125 MCG tablet Take 125 mcg by mouth daily. 11/19/19  Yes [provider]  LORazepam (ATIVAN) 1 MG tablet Take 1 mg by mouth 2 (two) times daily. 05/15/22  Yes [provider]  metoprolol tartrate (LOPRESSOR) 50 MG tablet Take 1 tablet (50 mg total) by mouth 2 (two) times daily. 01/22/22  Yes Revankar, Reita Cliche, MD  Omega-3 Fatty Acids (FISH OIL) 1000 MG CPDR Take 2,000 mg by mouth 2 (two) times daily.   Yes [provider]  omeprazole (PRILOSEC OTC) 20 MG tablet Take 20 mg by mouth 2 (two) times daily.   Yes [provider]  polyethylene glycol (MIRALAX / GLYCOLAX) 17 g packet Take 17 g by mouth daily as needed for constipation.   Yes  [provider]  QUEtiapine (SEROQUEL XR) 200 MG 24 hr tablet Take 200 mg by mouth at bedtime. 07/23/22  Yes [provider]  rosuvastatin (CRESTOR) 10 MG tablet Take 10 mg by mouth every evening.   Yes [provider]    Inpatient Medications: Scheduled Meds:  amiodarone  200 mg Oral Daily   apixaban  5 mg Oral BID   cholecalciferol  5,000 Units Oral Daily   famotidine  20 mg Oral Daily   lamoTRIgine  100 mg Oral QHS   levothyroxine  125 mcg Oral Daily   lidocaine  2 patch Transdermal Q24H   metoprolol tartrate  50 mg Oral BID   pantoprazole  40 mg Oral Daily   QUEtiapine  200 mg Oral QHS   rosuvastatin  10 mg Oral QPM   thiamine  100 mg Oral Daily   Continuous Infusions:  PRN Meds: acetaminophen **OR** acetaminophen (TYLENOL) oral liquid 160 mg/5 mL **OR** acetaminophen, hydrOXYzine, labetalol, LORazepam, polyethylene glycol  Allergies:  Allergies  Allergen Reactions   Trileptal [Oxcarbazepine] Rash    Social History:   Social History   Socioeconomic History   Marital status: Married    Spouse name: Not on file   Number of children: 3   Years of education: 10th grade   Highest education level: Not on file  Occupational History   Occupation: Retired  Tobacco Use   Smoking status: Former    Types: Cigarettes    Quit date: 2011    Years since quitting: 13.1   Smokeless tobacco: Former  Scientific laboratory technician Use: Never used  Substance and Sexual Activity   Alcohol use: Not Currently   Drug use: Never   Sexual activity: Not on file  Other Topics Concern   Not on file  Social History Narrative   Lives at home with wife.   Right-handed.   2 cups of caffeine per day.   Social Determinants of Health   Financial Resource Strain: Not on file  Food Insecurity: No Food Insecurity (07/30/2022)   Hunger Vital Sign    Worried About Running Out of Food in the Last Year: Never true    Ran Out of Food in the Last Year: Never true   Transportation Needs: No Transportation Needs (07/30/2022)   PRAPARE - Hydrologist (Medical): No    Lack of Transportation (Non-Medical): No  Physical Activity: Not on file  Stress: Not on file  Social Connections: Not on file  Intimate Partner Violence: Not At Risk (07/30/2022)   Humiliation, Afraid, Rape, and Kick questionnaire    Fear of Current or Ex-Partner: No    Emotionally Abused: No    Physically Abused: No    Sexually Abused: No    Family History:    Family History  Problem Relation Age of Onset   Other Mother        brain tumor - unsure if it was cancer   Heart disease Father    Prostate cancer Father    Stomach cancer Father    Colon cancer Neg Hx    Esophageal cancer Neg Hx    Rectal cancer Neg Hx      ROS:  Please see the history of present illness.   All other ROS reviewed and negative.     Physical Exam/Data:   Vitals:   07/31/22 0326 07/31/22 0400 07/31/22 0500 07/31/22 0938  BP: (!) 87/57   139/86  Pulse: 76 64 77 88  Resp: 18 16 15 18  $ Temp: 98.6 F (37 C)     TempSrc:      SpO2: 95% 95% 95% 93%  Weight:        Intake/Output Summary (Last 24 hours) at 07/31/2022 1141 Last data filed at 07/31/2022 0900 Gross per 24 hour  Intake 680 ml  Output 1800 ml  Net -1120 ml      07/29/2022   12:00 PM 07/07/2022    6:25 AM 07/05/2022   12:17 PM  Last 3 Weights  Weight (lbs) 209 lb 3.5 oz 209 lb 3.5 oz 209 lb 7 oz  Weight (kg) 94.9 kg 94.9 kg 95 kg     Body mass index is 32.77 kg/m.  General:  Well nourished, well developed, in no acute distress HEENT: normal Neck: no JVD Vascular: No carotid bruits; Distal pulses 2+ bilaterally Cardiac:  normal S1, S2; RRR; 2/6 systolic murmur  Lungs:  clear to auscultation bilaterally, no wheezing, rhonchi or rales  Abd: soft,  nontender, no hepatomegaly  Ext: no edema Musculoskeletal:  No deformities, BUE and BLE strength normal and equal Skin: warm and dry  Neuro:  CNs 2-12  intact, no focal abnormalities noted Psych:  Normal affect   EKG:  The EKG was personally reviewed and demonstrates: 07/07/2022-sinus rhythm 76 with right bundle branch block left anterior fascicular block, bifascicular block.  Stable.  Telemetry:  Telemetry was personally reviewed and demonstrates: Previously atrial fibrillation, recent conversion.  Relevant CV Studies:  Echocardiogram 07/06/2022:   1. Left ventricular ejection fraction, by estimation, is 55 to 60%. The  left ventricle has normal function. The left ventricle has no regional  wall motion abnormalities. There is moderate left ventricular hypertrophy.  Left ventricular diastolic  parameters are indeterminate.   2. Right ventricular systolic function is normal. The right ventricular  size is normal.   3. Left atrial size was moderately dilated.   4. The mitral valve is abnormal. Mild mitral valve regurgitation. No  evidence of mitral stenosis.   5. Fused right and left cusp suspect there is at least mild stenosis  Consider having patient return for dedicated CW doppler interrogation of  AV. The aortic valve is normal in structure. There is moderate  calcification of the aortic valve. There is  moderate thickening of the aortic valve. Aortic valve regurgitation is  mild. Aortic valve sclerosis/calcification is present, without any  evidence of aortic stenosis.   6. The inferior vena cava is normal in size with greater than 50%  respiratory variability, suggesting right atrial pressure of 3 mmHg.   Laboratory Data:  High Sensitivity Troponin:   Recent Labs  Lab 07/05/22 1515 07/05/22 1916 07/06/22 0316 07/06/22 0807 07/06/22 0952  TROPONINIHS 1,667* 2,468* 3,064* 2,949* 2,604*     Chemistry Recent Labs  Lab 07/29/22 0646 07/30/22 0337 07/31/22 0334  NA 140 138 140  K 4.0 3.9 4.2  CL 108 103 104  CO2 23 26 27  $ GLUCOSE 95 93 93  BUN 10 10 11  $ CREATININE 1.52* 1.72* 1.84*  CALCIUM 8.9 9.1 9.3  GFRNONAA  46* 40* 37*  ANIONGAP 9 9 9    $ No results for input(s): "PROT", "ALBUMIN", "AST", "ALT", "ALKPHOS", "BILITOT" in the last 168 hours. Lipids  Recent Labs  Lab 07/28/22 0117  CHOL 104  TRIG 128  HDL 38*  LDLCALC 40  CHOLHDL 2.7    Hematology Recent Labs  Lab 07/29/22 0646 07/30/22 0337 07/31/22 0334  WBC 4.3 4.8 5.2  RBC 4.12* 4.41 4.57  HGB 11.8* 12.6* 12.9*  HCT 37.1* 38.1* 41.1  MCV 90.0 86.4 89.9  MCH 28.6 28.6 28.2  MCHC 31.8 33.1 31.4  RDW 16.0* 15.8* 15.9*  PLT 172 188 188   Thyroid  Recent Labs  Lab 07/30/22 0332 07/31/22 0333  TSH 28.982*  --   FREET4  --  0.98    BNPNo results for input(s): "BNP", "PROBNP" in the last 168 hours.  DDimer No results for input(s): "DDIMER" in the last 168 hours.   Radiology/Studies:  VAS US CAROTID  Result Date: 07/29/2022 Carotid Arterial Duplex Study Patient Name:  [name]  Date of Exam:   07/28/2022 Medical Rec #: [id number]               Accession #:    [id number] Date of Birth: 1940/12/01                Patient Gender: M Patient Age:   70 years Exam Location:  [facility] Procedure:      VAS US CAROTID Referring Phys: Cornelius Moras XU --------------------------------------------------------------------------------  Indications:       CVA. Risk Factors:      Hypertension. Limitations        Today's exam was limited due to the patient's respiratory                    variation and patient positioning. Comparison Study:  No prior studies. Performing Technologist: Oliver Hum RVT  Examination Guidelines: A complete evaluation includes B-mode imaging, spectral Doppler, color Doppler, and power Doppler as needed of all accessible portions of each vessel. Bilateral testing is considered an integral part of a complete examination. Limited examinations for reoccurring indications may be performed as noted.  Right Carotid Findings: +----------+--------+--------+--------+--------------------------+--------+           PSV  cm/sEDV cm/sStenosisPlaque Description        Comments +----------+--------+--------+--------+--------------------------+--------+ CCA Prox  73      19              smooth and heterogenous            +----------+--------+--------+--------+--------------------------+--------+ CCA Distal64      19              smooth and heterogenous            +----------+--------+--------+--------+--------------------------+--------+ ICA Prox  93      30              irregular and heterogenous         +----------+--------+--------+--------+--------------------------+--------+ ICA Distal92      30                                                 +----------+--------+--------+--------+--------------------------+--------+ ECA       127     17                                                 +----------+--------+--------+--------+--------------------------+--------+ +----------+--------+-------+--------+-------------------+           PSV cm/sEDV cmsDescribeArm Pressure (mmHG) +----------+--------+-------+--------+-------------------+ [id number]                                         +----------+--------+-------+--------+-------------------+ +---------+--------+--+--------+--+---------+ VertebralPSV cm/s39EDV cm/s11Antegrade +---------+--------+--+--------+--+---------+  Left Carotid Findings: +----------+--------+--------+--------+--------------------------+--------+           PSV cm/sEDV cm/sStenosisPlaque Description        Comments +----------+--------+--------+--------+--------------------------+--------+ CCA Prox  106     15              smooth and heterogenous            +----------+--------+--------+--------+--------------------------+--------+ CCA Distal79      25              irregular and heterogenous         +----------+--------+--------+--------+--------------------------+--------+ ICA Prox  62      15              irregular and  heterogenous         +----------+--------+--------+--------+--------------------------+--------+ ICA Distal86      30  tortuous +----------+--------+--------+--------+--------------------------+--------+ ECA       106     16                                                 +----------+--------+--------+--------+--------------------------+--------+ +----------+--------+--------+--------+-------------------+           PSV cm/sEDV cm/sDescribeArm Pressure (mmHG) +----------+--------+--------+--------+-------------------+ Subclavian118                                         +----------+--------+--------+--------+-------------------+ +---------+--------+--+--------+--+---------+ VertebralPSV cm/s64EDV cm/s21Antegrade +---------+--------+--+--------+--+---------+   Summary: Right Carotid: Velocities in the right ICA are consistent with a 1-39% stenosis. Left Carotid: Velocities in the left ICA are consistent with a 1-39% stenosis. Vertebrals: Bilateral vertebral arteries demonstrate antegrade flow. *See table(s) above for measurements and observations.  Electronically signed by Antony Contras MD on 07/29/2022 at 4:05:30 PM.    Final    MR BRAIN WO CONTRAST  Result Date: 07/28/2022 CLINICAL DATA:  Stroke follow-up EXAM: MRI HEAD WITHOUT CONTRAST MRA HEAD WITHOUT CONTRAST TECHNIQUE: Multiplanar, multi-echo pulse sequences of the brain and surrounding structures were acquired without intravenous contrast. Angiographic images of the Circle of Willis were acquired using MRA technique without intravenous contrast. COMPARISON:  07/05/2022 FINDINGS: MRI HEAD FINDINGS Brain: No acute infarction, hemorrhage, hydrocephalus, extra-axial collection or mass lesion. Generalized brain atrophy. Mild chronic small vessel ischemia in the cerebral white matter for age. Vascular: See below Skull and upper cervical spine: Normal marrow signal Sinuses/Orbits: Unremarkable  Other: Intermittent significant motion artifact MRA HEAD FINDINGS Anterior circulation: Accounting for artifact the major vessels are smoothly contoured and widely patent. No branch occlusion, beading, or aneurysm. Posterior circulation: Left dominant vertebral artery. The basilar artery is smoothly contoured and widely patent. No branch occlusion, beading, or aneurysm. IMPRESSION: Brain MRI: 1. No acute finding including infarct. 2. Generalized atrophy. 3. Moderate motion artifact. Intracranial MRA: Negative. Electronically Signed   By: Jorje Guild M.D.   On: 07/28/2022 12:12   MR ANGIO HEAD WO CONTRAST  Result Date: 07/28/2022 CLINICAL DATA:  Stroke follow-up EXAM: MRI HEAD WITHOUT CONTRAST MRA HEAD WITHOUT CONTRAST TECHNIQUE: Multiplanar, multi-echo pulse sequences of the brain and surrounding structures were acquired without intravenous contrast. Angiographic images of the Circle of Willis were acquired using MRA technique without intravenous contrast. COMPARISON:  07/05/2022 FINDINGS: MRI HEAD FINDINGS Brain: No acute infarction, hemorrhage, hydrocephalus, extra-axial collection or mass lesion. Generalized brain atrophy. Mild chronic small vessel ischemia in the cerebral white matter for age. Vascular: See below Skull and upper cervical spine: Normal marrow signal Sinuses/Orbits: Unremarkable Other: Intermittent significant motion artifact MRA HEAD FINDINGS Anterior circulation: Accounting for artifact the major vessels are smoothly contoured and widely patent. No branch occlusion, beading, or aneurysm. Posterior circulation: Left dominant vertebral artery. The basilar artery is smoothly contoured and widely patent. No branch occlusion, beading, or aneurysm. IMPRESSION: Brain MRI: 1. No acute finding including infarct. 2. Generalized atrophy. 3. Moderate motion artifact. Intracranial MRA: Negative. Electronically Signed   By: Jorje Guild M.D.   On: 07/28/2022 12:12   CT HEAD WO CONTRAST  Result  Date: 07/28/2022 CLINICAL DATA:  Provided history: Stroke, follow-up. EXAM: CT HEAD WITHOUT CONTRAST TECHNIQUE: Contiguous axial images were obtained from the base of the skull through the vertex without intravenous contrast. RADIATION DOSE REDUCTION:  This exam was performed according to the departmental dose-optimization program which includes automated exposure control, adjustment of the mA and/or kV according to patient size and/or use of iterative reconstruction technique. COMPARISON:  Noncontrast head CT and CT angiogram head/neck 07/27/2022. FINDINGS: Motion degraded examination, particularly at the level the skull base. Brain: Generalized cerebral atrophy. Mild patchy and ill-defined hypoattenuation within the cerebral white matter, nonspecific but compatible with chronic small ischemic disease. There is no acute intracranial hemorrhage. No demarcated cortical infarct. No extra-axial fluid collection. No evidence of an intracranial mass. No midline shift. Vascular: No hyperdense vessel.  Atherosclerotic calcifications. Skull: No fracture or aggressive osseous lesion. Sinuses/Orbits: No mass or acute finding within the imaged orbits. 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 IMPRESSION: 1. Motion degraded examination, particularly at the level the skull base. 2.  No evidence of an acute intracranial abnormality. 3. Parenchymal atrophy and chronic small ischemic disease. Electronically Signed   By: Kellie Simmering D.O.   On: 07/28/2022 10:45     Assessment and Plan:   82 year old with severe depression, paroxysmal atrial fibrillation, preoperative cardiac evaluation for ECT.  Preop cardiac risk - He may proceed with ECT with moderate cardiac risk based upon recent elevated troponin of 3000 and trending downward compatible with type 2 myocardial infarction in the setting of prior pneumonia in late January 2024.   -Earlier this morning atrial flutter/fibrillation has converted to sinus rhythm.  It  would not be uncommon to see atrial arrhythmia return.  Overall he has been under adequate rate control.  Coronary artery calcification - Continue with secondary prevention.  Statin noted.  Paroxysmal atrial fibrillation Chronic anticoagulation - Given recent GI bleed, apixaban was discussed previously with gastroenterology team.  He is now on Eliquis 5 mg twice a day.  Continue.  Watch for any signs of bleeding.  Prior hemoglobin 12.6 and stable. -Obviously can broach Watchman device as outpatient.  Unfortunately he did have an appointment tomorrow with Dr. Quentin Ore to discuss however this will need to be canceled and rescheduled.  Transient ischemic attack - Neurology note reviewed.  MRI showed no acute infarct.  MRI was negative.  Echocardiogram thankfully shows normal ejection fraction of 55 to 60%.  LDL is at goal at 40.  Excellent hemoglobin A1c.  Bipolar disorder - ECT has worked for him in the past.  Aortic sclerosis/mild aortic stenosis - Stable.  Continue to monitor as outpatient.   For questions or updates, please contact [place] Please consult [email or web address] for contact info under    Signed, Candee Furbish, MD  07/31/2022 11:41 AM
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
## 2022-07-31 NOTE — Plan of Care (Signed)
  Problem: Education: Goal: Knowledge of General Education information will improve Description: Including pain rating scale, medication(s)/side effects and non-pharmacologic comfort measures Outcome: Progressing   Problem: Activity: Goal: Risk for activity intolerance will decrease Outcome: Progressing   Problem: Pain Managment: Goal: General experience of comfort will improve Outcome: Progressing   Problem: Skin Integrity: Goal: Risk for impaired skin integrity will decrease Outcome: Progressing   Problem: Education: Goal: Knowledge of disease or condition will improve Outcome: Progressing

## 2022-07-31 NOTE — Progress Notes (Signed)
Mobility Specialist: Progress Note   07/31/22 1105  Mobility  Activity Ambulated with assistance in hallway  Level of Assistance Standby assist, set-up cues, supervision of patient - no hands on  Assistive Device Front wheel walker  Distance Ambulated (ft) 350 ft  Activity Response Tolerated well  Mobility Referral Yes  $Mobility charge 1 Mobility   Pre-Mobility: 76 HR, 91% SpO2 Post-Mobility: 77 HR, 93% SpO2  Pt received in the chair and agreeable to mobility. Mod I to stand and standby assist during ambulation. No c/o throughout. Verbal cues for RW proximity when turning. Pt back to the chair after session with NT present in the room.   [place] Leanne Sisler Mobility Specialist Please contact via SecureChat or Rehab office at [phone]

## 2022-08-01 ENCOUNTER — Ambulatory Visit: Payer: Medicare Other | Admitting: Cardiology

## 2022-08-01 DIAGNOSIS — I482 Chronic atrial fibrillation, unspecified: Secondary | ICD-10-CM | POA: Diagnosis not present

## 2022-08-01 DIAGNOSIS — F314 Bipolar disorder, current episode depressed, severe, without psychotic features: Secondary | ICD-10-CM | POA: Diagnosis not present

## 2022-08-01 DIAGNOSIS — G459 Transient cerebral ischemic attack, unspecified: Secondary | ICD-10-CM | POA: Diagnosis not present

## 2022-08-01 LAB — TSH: TSH: 23.659 u[IU]/mL — ABNORMAL HIGH (ref 0.350–4.500)

## 2022-08-01 LAB — BASIC METABOLIC PANEL
Anion gap: 10 (ref 5–15)
BUN: 12 mg/dL (ref 8–23)
CO2: 25 mmol/L (ref 22–32)
Calcium: 9.1 mg/dL (ref 8.9–10.3)
Chloride: 106 mmol/L (ref 98–111)
Creatinine, Ser: 1.7 mg/dL — ABNORMAL HIGH (ref 0.61–1.24)
GFR, Estimated: 41 mL/min — ABNORMAL LOW (ref >60)
Glucose, Bld: 81 mg/dL (ref 70–99)
Potassium: 3.7 mmol/L (ref 3.5–5.1)
Sodium: 141 mmol/L (ref 135–145)

## 2022-08-01 LAB — CBC
HCT: 38.2 % — ABNORMAL LOW (ref 39.0–52.0)
Hemoglobin: 12.3 g/dL — ABNORMAL LOW (ref 13.0–17.0)
MCH: 29 pg (ref 26.0–34.0)
MCHC: 32.2 g/dL (ref 30.0–36.0)
MCV: 90.1 fL (ref 80.0–100.0)
Platelets: 151 10*3/uL (ref 150–400)
RBC: 4.24 MIL/uL (ref 4.22–5.81)
RDW: 15.9 % — ABNORMAL HIGH (ref 11.5–15.5)
WBC: 4.4 10*3/uL (ref 4.0–10.5)
nRBC: 0 % (ref 0.0–0.2)

## 2022-08-01 LAB — SARS CORONAVIRUS 2 (TAT 6-24 HRS): SARS Coronavirus 2: NEGATIVE

## 2022-08-01 MED ORDER — BISMUTH SUBSALICYLATE 262 MG/15ML PO SUSP
30.0000 mL | Freq: Once | ORAL | Status: DC
  Filled 2022-08-01: qty 236

## 2022-08-01 MED ORDER — SODIUM CHLORIDE 0.9 % IV SOLN: INTRAVENOUS | Status: DC

## 2022-08-01 NOTE — Progress Notes (Signed)
STROKE TEAM PROGRESS NOTE   SUBJECTIVE (INTERVAL HISTORY) Patient's wife is at the bedside.  Patient sitting up on edge of bed. No acute events overnight. Patient is cooperative throughout exam. Neurological exam is stable and unchanged. VSS  GI cleared patient for Eliquis initiation.  Eliquis was started 2/19.  Psychiatry on board, patient under IVC orders.   Patient plan for transfer to behavioral [facility] for further evaluation and possible ECT.  Pending confirmation of bed availability.    OBJECTIVE Temp:  [97.5 F (36.4 C)-98.5 F (36.9 C)] 98.5 F (36.9 C) (02/21 1542) Pulse Rate:  [60-74] 74 (02/21 1542) Cardiac Rhythm: Sinus bradycardia (02/21 1130) Resp:  [17-20] 20 (02/21 1542) BP: (126-172)/(63-104) 168/82 (02/21 1542) SpO2:  [95 %-98 %] 95 % (02/21 1542)  Recent Labs  Lab 07/29/22 0336 07/29/22 0752 07/29/22 1112 07/29/22 1539 07/29/22 1944  GLUCAP 88 85 94 103* 113*    Recent Labs  Lab 07/28/22 0117 07/29/22 0646 07/30/22 0337 07/31/22 0334 08/01/22 0656  NA 141 140 138 140 141  K 3.9 4.0 3.9 4.2 3.7  CL 110 108 103 104 106  CO2 25 23 26 27 25  $ GLUCOSE 88 95 93 93 81  BUN 13 10 10 11 12  $ CREATININE 1.58* 1.52* 1.72* 1.84* 1.70*  CALCIUM 8.3* 8.9 9.1 9.3 9.1    No results for input(s): "AST", "ALT", "ALKPHOS", "BILITOT", "PROT", "ALBUMIN" in the last 168 hours. Recent Labs  Lab 07/28/22 0117 07/29/22 0646 07/30/22 0337 07/31/22 0334 08/01/22 0656  WBC 4.4 4.3 4.8 5.2 4.4  HGB 12.1* 11.8* 12.6* 12.9* 12.3*  HCT 37.2* 37.1* 38.1* 41.1 38.2*  MCV 88.6 90.0 86.4 89.9 90.1  PLT 195 172 188 188 151    No results for input(s): "CKTOTAL", "CKMB", "CKMBINDEX", "TROPONINI" in the last 168 hours. No results for input(s): "LABPROT", "INR" in the last 72 hours. No results for input(s): "COLORURINE", "LABSPEC", "PHURINE", "GLUCOSEU", "HGBUR", "BILIRUBINUR", "KETONESUR", "PROTEINUR", "UROBILINOGEN", "NITRITE", "LEUKOCYTESUR" in the last 72  hours.  Invalid input(s): "APPERANCEUR"     Component Value Date/Time   CHOL 104 07/28/2022 0117   CHOL 173 07/25/2021 0923   TRIG 128 07/28/2022 0117   HDL 38 (L) 07/28/2022 0117   HDL 44 07/25/2021 0923   CHOLHDL 2.7 07/28/2022 0117   VLDL 26 07/28/2022 0117   LDLCALC 40 07/28/2022 0117   LDLCALC 86 07/25/2021 0923   Lab Results  Component Value Date   HGBA1C 5.6 07/27/2022   No results found for: "LABOPIA", "COCAINSCRNUR", "LABBENZ", "AMPHETMU", "THCU", "LABBARB"  No results for input(s): "ETH" in the last 168 hours.  I have personally reviewed the radiological images below and agree with the radiology interpretations.  VAS US CAROTID  Result Date: 07/29/2022 Carotid Arterial Duplex Study Patient Name:  [name]  Date of Exam:   07/28/2022 Medical Rec #: [id number]               Accession #:    [id number] Date of Birth: June 27, 1940                Patient Gender: M Patient Age:   82 years Exam Location:  [facility] Procedure:      VAS US CAROTID Referring Phys: Rosalin Hawking --------------------------------------------------------------------------------  Indications:       CVA. Risk Factors:      Hypertension. Limitations        Today's exam was limited due to the patient's respiratory  variation and patient positioning. Comparison Study:  No prior studies. Performing Technologist: Oliver Hum RVT  Examination Guidelines: A complete evaluation includes B-mode imaging, spectral Doppler, color Doppler, and power Doppler as needed of all accessible portions of each vessel. Bilateral testing is considered an integral part of a complete examination. Limited examinations for reoccurring indications may be performed as noted.  Right Carotid Findings: +----------+--------+--------+--------+--------------------------+--------+           PSV cm/sEDV cm/sStenosisPlaque Description        Comments  +----------+--------+--------+--------+--------------------------+--------+ CCA Prox  73      19              smooth and heterogenous            +----------+--------+--------+--------+--------------------------+--------+ CCA Distal64      19              smooth and heterogenous            +----------+--------+--------+--------+--------------------------+--------+ ICA Prox  93      30              irregular and heterogenous         +----------+--------+--------+--------+--------------------------+--------+ ICA Distal92      30                                                 +----------+--------+--------+--------+--------------------------+--------+ ECA       127     17                                                 +----------+--------+--------+--------+--------------------------+--------+ +----------+--------+-------+--------+-------------------+           PSV cm/sEDV cmsDescribeArm Pressure (mmHG) +----------+--------+-------+--------+-------------------+ [id number]                                         +----------+--------+-------+--------+-------------------+ +---------+--------+--+--------+--+---------+ VertebralPSV cm/s39EDV cm/s11Antegrade +---------+--------+--+--------+--+---------+  Left Carotid Findings: +----------+--------+--------+--------+--------------------------+--------+           PSV cm/sEDV cm/sStenosisPlaque Description        Comments +----------+--------+--------+--------+--------------------------+--------+ CCA Prox  106     15              smooth and heterogenous            +----------+--------+--------+--------+--------------------------+--------+ CCA Distal79      25              irregular and heterogenous         +----------+--------+--------+--------+--------------------------+--------+ ICA Prox  62      15              irregular and heterogenous          +----------+--------+--------+--------+--------------------------+--------+ ICA Distal86      30                                        tortuous +----------+--------+--------+--------+--------------------------+--------+ ECA       106     16                                                 +----------+--------+--------+--------+--------------------------+--------+ +----------+--------+--------+--------+-------------------+  PSV cm/sEDV cm/sDescribeArm Pressure (mmHG) +----------+--------+--------+--------+-------------------+ Subclavian118                                         +----------+--------+--------+--------+-------------------+ +---------+--------+--+--------+--+---------+ VertebralPSV cm/s64EDV cm/s21Antegrade +---------+--------+--+--------+--+---------+   Summary: Right Carotid: Velocities in the right ICA are consistent with a 1-39% stenosis. Left Carotid: Velocities in the left ICA are consistent with a 1-39% stenosis. Vertebrals: Bilateral vertebral arteries demonstrate antegrade flow. *See table(s) above for measurements and observations.  Electronically signed by Antony Contras MD on 07/29/2022 at 4:05:30 PM.    Final    MR BRAIN WO CONTRAST  Result Date: 07/28/2022 CLINICAL DATA:  Stroke follow-up EXAM: MRI HEAD WITHOUT CONTRAST MRA HEAD WITHOUT CONTRAST TECHNIQUE: Multiplanar, multi-echo pulse sequences of the brain and surrounding structures were acquired without intravenous contrast. Angiographic images of the Circle of Willis were acquired using MRA technique without intravenous contrast. COMPARISON:  07/05/2022 FINDINGS: MRI HEAD FINDINGS Brain: No acute infarction, hemorrhage, hydrocephalus, extra-axial collection or mass lesion. Generalized brain atrophy. Mild chronic small vessel ischemia in the cerebral white matter for age. Vascular: See below Skull and upper cervical spine: Normal marrow signal Sinuses/Orbits: Unremarkable Other: Intermittent  significant motion artifact MRA HEAD FINDINGS Anterior circulation: Accounting for artifact the major vessels are smoothly contoured and widely patent. No branch occlusion, beading, or aneurysm. Posterior circulation: Left dominant vertebral artery. The basilar artery is smoothly contoured and widely patent. No branch occlusion, beading, or aneurysm. IMPRESSION: Brain MRI: 1. No acute finding including infarct. 2. Generalized atrophy. 3. Moderate motion artifact. Intracranial MRA: Negative. Electronically Signed   By: Jorje Guild M.D.   On: 07/28/2022 12:12   MR ANGIO HEAD WO CONTRAST  Result Date: 07/28/2022 CLINICAL DATA:  Stroke follow-up EXAM: MRI HEAD WITHOUT CONTRAST MRA HEAD WITHOUT CONTRAST TECHNIQUE: Multiplanar, multi-echo pulse sequences of the brain and surrounding structures were acquired without intravenous contrast. Angiographic images of the Circle of Willis were acquired using MRA technique without intravenous contrast. COMPARISON:  07/05/2022 FINDINGS: MRI HEAD FINDINGS Brain: No acute infarction, hemorrhage, hydrocephalus, extra-axial collection or mass lesion. Generalized brain atrophy. Mild chronic small vessel ischemia in the cerebral white matter for age. Vascular: See below Skull and upper cervical spine: Normal marrow signal Sinuses/Orbits: Unremarkable Other: Intermittent significant motion artifact MRA HEAD FINDINGS Anterior circulation: Accounting for artifact the major vessels are smoothly contoured and widely patent. No branch occlusion, beading, or aneurysm. Posterior circulation: Left dominant vertebral artery. The basilar artery is smoothly contoured and widely patent. No branch occlusion, beading, or aneurysm. IMPRESSION: Brain MRI: 1. No acute finding including infarct. 2. Generalized atrophy. 3. Moderate motion artifact. Intracranial MRA: Negative. Electronically Signed   By: Jorje Guild M.D.   On: 07/28/2022 12:12   CT HEAD WO CONTRAST  Result Date:  07/28/2022 CLINICAL DATA:  Provided history: Stroke, follow-up. EXAM: CT HEAD WITHOUT CONTRAST TECHNIQUE: Contiguous axial images were obtained from the base of the skull through the vertex without intravenous contrast. RADIATION DOSE REDUCTION: This exam was performed according to the departmental dose-optimization program which includes automated exposure control, adjustment of the mA and/or kV according to patient size and/or use of iterative reconstruction technique. COMPARISON:  Noncontrast head CT and CT angiogram head/neck 07/27/2022. FINDINGS: Motion degraded examination, particularly at the level the skull base. Brain: Generalized cerebral atrophy. Mild patchy and ill-defined hypoattenuation within the cerebral white matter, nonspecific but compatible with chronic small ischemic disease. There  is no acute intracranial hemorrhage. No demarcated cortical infarct. No extra-axial fluid collection. No evidence of an intracranial mass. No midline shift. Vascular: No hyperdense vessel.  Atherosclerotic calcifications. Skull: No fracture or aggressive osseous lesion. Sinuses/Orbits: No mass or acute finding within the imaged orbits. 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 IMPRESSION: 1. Motion degraded examination, particularly at the level the skull base. 2.  No evidence of an acute intracranial abnormality. 3. Parenchymal atrophy and chronic small ischemic disease. Electronically Signed   By: Kellie Simmering D.O.   On: 07/28/2022 10:45   ECHOCARDIOGRAM COMPLETE  Result Date: 07/06/2022    ECHOCARDIOGRAM REPORT   Patient Name:   [name] Date of Exam: 07/06/2022 Medical Rec #:  [id number]              Height:       67.0 in Accession #:    [id number]             Weight:       209.4 lb Date of Birth:  09-28-1940               BSA:          2.062 m Patient Age:    86 years               BP:           83/56 mmHg Patient Gender: M                      HR:           102 bpm. Exam Location:  Inpatient  Procedure: 2D Echo, Color Doppler and Cardiac Doppler Indications:    elevated troponin  History:        Patient has prior history of Echocardiogram examinations.                 Previous Myocardial Infarction and CAD, Arrythmias:Atrial                 Fibrillation and RBBB; Risk Factors:Hypertension.  Sonographer:    Phineas Douglas Referring Phys: [id number] [place]  1. Left ventricular ejection fraction, by estimation, is 55 to 60%. The left ventricle has normal function. The left ventricle has no regional wall motion abnormalities. There is moderate left ventricular hypertrophy. Left ventricular diastolic parameters are indeterminate.  2. Right ventricular systolic function is normal. The right ventricular size is normal.  3. Left atrial size was moderately dilated.  4. The mitral valve is abnormal. Mild mitral valve regurgitation. No evidence of mitral stenosis.  5. Fused right and left cusp suspect there is at least mild stenosis Consider having patient return for dedicated CW doppler interrogation of AV. The aortic valve is normal in structure. There is moderate calcification of the aortic valve. There is moderate thickening of the aortic valve. Aortic valve regurgitation is mild. Aortic valve sclerosis/calcification is present, without any evidence of aortic stenosis.  6. The inferior vena cava is normal in size with greater than 50% respiratory variability, suggesting right atrial pressure of 3 mmHg. FINDINGS  Left Ventricle: Left ventricular ejection fraction, by estimation, is 55 to 60%. The left ventricle has normal function. The left ventricle has no regional wall motion abnormalities. The left ventricular internal cavity size was normal in size. There is  moderate left ventricular hypertrophy. Left ventricular diastolic parameters are indeterminate. Right Ventricle: The right ventricular size is normal. No increase in right  ventricular wall thickness. Right ventricular systolic function is  normal. Left Atrium: Left atrial size was moderately dilated. Right Atrium: Right atrial size was normal in size. Pericardium: There is no evidence of pericardial effusion. Mitral Valve: The mitral valve is abnormal. There is mild thickening of the mitral valve leaflet(s). There is mild calcification of the mitral valve leaflet(s). Mild mitral annular calcification. Mild mitral valve regurgitation. No evidence of mitral valve stenosis. Tricuspid Valve: The tricuspid valve is normal in structure. Tricuspid valve regurgitation is mild . No evidence of tricuspid stenosis. Aortic Valve: Fused right and left cusp suspect there is at least mild stenosis Consider having patient return for dedicated CW doppler interrogation of AV. The aortic valve is normal in structure. There is moderate calcification of the aortic valve. There is moderate thickening of the aortic valve. Aortic valve regurgitation is mild. Aortic regurgitation PHT measures 443 msec. Aortic valve sclerosis/calcification is present, without any evidence of aortic stenosis. Pulmonic Valve: The pulmonic valve was normal in structure. Pulmonic valve regurgitation is not visualized. No evidence of pulmonic stenosis. Aorta: The aortic root is normal in size and structure. Venous: The inferior vena cava is normal in size with greater than 50% respiratory variability, suggesting right atrial pressure of 3 mmHg. IAS/Shunts: No atrial level shunt detected by color flow Doppler.  LEFT VENTRICLE PLAX 2D LVIDd:         4.60 cm      Diastology LVIDs:         3.10 cm      LV e' medial:    9.14 cm/s LV PW:         1.30 cm      LV E/e' medial:  7.3 LV IVS:        1.50 cm      LV e' lateral:   8.70 cm/s LVOT diam:     2.10 cm      LV E/e' lateral: 7.6 LV SV:         43 LV SV Index:   21 LVOT Area:     3.46 cm  LV Volumes (MOD) LV vol d, MOD A2C: 124.0 ml LV vol d, MOD A4C: 101.0 ml LV vol s, MOD A2C: 53.7 ml LV vol s, MOD A4C: 42.3 ml LV SV MOD A2C:     70.3 ml LV SV MOD  A4C:     101.0 ml LV SV MOD BP:      71.4 ml RIGHT VENTRICLE             IVC RV Basal diam:  3.80 cm     IVC diam: 2.60 cm RV S prime:     10.10 cm/s TAPSE (M-mode): 1.5 cm LEFT ATRIUM             Index        RIGHT ATRIUM           Index LA diam:        4.30 cm 2.09 cm/m   RA Area:     20.90 cm LA Vol (A2C):   96.7 ml 46.89 ml/m  RA Volume:   61.00 ml  29.58 ml/m LA Vol (A4C):   99.2 ml 48.10 ml/m LA Biplane Vol: 97.7 ml 47.38 ml/m  AORTIC VALVE LVOT Vmax:   78.30 cm/s LVOT Vmean:  48.700 cm/s LVOT VTI:    0.125 m AI PHT:      443 msec  AORTA Ao Root diam: 3.40 cm Ao Asc diam:  3.20  cm MITRAL VALVE MV Area (PHT): 5.16 cm    SHUNTS MV Decel Time: 147 msec    Systemic VTI:  0.12 m MV E velocity: 66.40 cm/s  Systemic Diam: 2.10 cm Jenkins Rouge MD Electronically signed by Jenkins Rouge MD Signature Date/Time: 07/06/2022/10:06:05 AM    Final    MR BRAIN WO CONTRAST  Result Date: 07/05/2022 CLINICAL DATA:  Neuro deficit, acute, stroke suspected Mental status change, unknown cause EXAM: MRI HEAD WITHOUT CONTRAST TECHNIQUE: Multiplanar, multiecho pulse sequences of the brain and surrounding structures were obtained without intravenous contrast. COMPARISON:  Same day CT head. FINDINGS: Brain: No acute infarction, hemorrhage, hydrocephalus, extra-axial collection or mass lesion. Chronic microvascular ischemic disease. Vascular: Major arterial flow voids are maintained at the skull base. Skull and upper cervical spine: Normal marrow signal. Sinuses/Orbits: Negative. Other: No sizable mastoid effusions. IMPRESSION: No evidence of acute intracranial abnormality. Electronically Signed   By: Margaretha Sheffield M.D.   On: 07/05/2022 18:58   CT Head Wo Contrast  Result Date: 07/05/2022 CLINICAL DATA:  Head trauma, minor (Age >= 65y) EXAM: CT HEAD WITHOUT CONTRAST TECHNIQUE: Contiguous axial images were obtained from the base of the skull through the vertex without intravenous contrast. RADIATION DOSE REDUCTION: This  exam was performed according to the departmental dose-optimization program which includes automated exposure control, adjustment of the mA and/or kV according to patient size and/or use of iterative reconstruction technique. COMPARISON:  May 04, 2022. FINDINGS: Brain: No evidence of acute infarction, hemorrhage, hydrocephalus, extra-axial collection or mass lesion/mass effect. Patchy white matter hypodensities nonspecific but compatible with chronic microvascular ischemic disease. Vascular: No hyperdense vessel. Skull: No acute fracture. Sinuses/Orbits: Anterior right ethmoid air cell osteoma. No acute orbital findings. Other: No mastoid effusions IMPRESSION: No evidence of acute intracranial abnormality. Electronically Signed   By: Margaretha Sheffield M.D.   On: 07/05/2022 14:33   CT Cervical Spine Wo Contrast  Result Date: 07/05/2022 CLINICAL DATA:  Neck trauma EXAM: CT CERVICAL SPINE WITHOUT CONTRAST TECHNIQUE: Multidetector CT imaging of the cervical spine was performed without intravenous contrast. Multiplanar CT image reconstructions were also generated. RADIATION DOSE REDUCTION: This exam was performed according to the departmental dose-optimization program which includes automated exposure control, adjustment of the mA and/or kV according to patient size and/or use of iterative reconstruction technique. COMPARISON:  None Available. FINDINGS: Alignment: Facet joints are aligned without dislocation or traumatic listhesis. Dens and lateral masses are aligned. Degenerative facet mediated grade 1 anterolisthesis of C4 on C5. Skull base and vertebrae: No acute fracture. No primary bone lesion or focal pathologic process. Soft tissues and spinal canal: No prevertebral fluid or swelling. No visible canal hematoma. Disc levels: Degenerative disc disease of the C4-5 through C6-7 levels. Multilevel facet arthropathy. The left C4-5 facet joint is fused. Upper chest: Negative. Other: Bilateral carotid  atherosclerosis. Small calcified left anterior chain cervical lymph nodes. IMPRESSION: 1. No acute fracture or traumatic listhesis of the cervical spine. 2. Multilevel degenerative disc disease and facet arthropathy. Electronically Signed   By: Davina Poke D.O.   On: 07/05/2022 14:04   DG Chest Port 1 View  Result Date: 07/05/2022 CLINICAL DATA:  Vomiting EXAM: PORTABLE CHEST 1 VIEW COMPARISON:  CXR 05/04/22 FINDINGS: No pleural effusion. No pneumothorax. There are patchy opacities in the right mid and lower lung, as well as the left lung base. These are worrisome for multifocal infection, possibly related to aspiration. No displaced rib fractures. Visualized upper abdomen is notable for gaseous distention of the colon at  the splenic flexure. IMPRESSION: Patchy opacities in the right mid and lower lung, as well as the left lung base. These are worrisome for multifocal infection, possibly related to aspiration. Electronically Signed   By: Marin Roberts M.D.   On: 07/05/2022 13:33    PHYSICAL EXAM  Temp:  [97.5 F (36.4 C)-98.5 F (36.9 C)] 98.5 F (36.9 C) (02/21 1542) Pulse Rate:  [60-74] 74 (02/21 1542) Resp:  [17-20] 20 (02/21 1542) BP: (126-172)/(63-104) 168/82 (02/21 1542) SpO2:  [95 %-98 %] 95 % (02/21 1542)  General - Well nourished, well developed, in no apparent distress.  Cardiovascular -sinus rhythm on cardiac monitor  Mental Status -  Level of arousal and orientation to person place time and situation were intact Speech is clear and fluent.   Cranial Nerves II - XII - II - Visual field intact OU. [name], IV, VI - Extraocular movements intact. V - Facial sensation intact bilaterally. VII - mild left nasolabial fold flattening VIII - Hearing & vestibular intact bilaterally. X - Palate elevates symmetrically. XI - Chin turning & shoulder shrug intact bilaterally. XII - Tongue protrusion intact.  Motor Strength - The patient's strength was normal in all extremities.  Bulk  was normal and fasciculations were absent.   Motor Tone - Muscle tone was assessed at the neck and appendages and was normal. Sensory - Light touch was assessed and symmetrical.   Coordination - The patient had normal movements in the hands and feet with no ataxia or dysmetria.  Tremor was absent. Gait and Station - deferred.  ASSESSMENT/PLAN [name] is a 82 y.o. male with history of  HTN, afib off coumadin, dementia, bipolar disorder, throat cancer status post chemotherapy and radiation admitted for right-sided weakness, slurred speech, confusion and aphasia.  tPA was given at outside hospital.  MRI reveals no stroke, patient likely had TIA or stroke aborted by TNK.  Stroke like symptoms status post TNK TIA likely due to A-fib not on [facility] CT at outside hospital no acute finding MRI no acute infarct MRA negative Carotid Doppler unremarkable 2D Echo EF 55 to 60% on 07/06/2022 LDL 40 HgbA1c 5.6 SCDs for VTE prophylaxis aspirin 81 mg daily prior to admission, now on aspirin 81 mg daily.  Eliquis started (approved by GI).   Ongoing aggressive stroke risk factor management Therapy recommendations: HH PT Disposition: Pending  Chronic A-fib Follow-up with Dr. Geraldo Pitter at [facility] Was on Coumadin before 07/05/2022 admission for pneumonia, AKI.  Before that admission, patient was found down initially in the pool of dark vomit, concerning for GI bleeding.  Hemoglobin stable.  GI evaluated patient and did not plan for any endoscopic evaluation warfarin was discontinued in the setting of presumed high risk bleeding.  Metoprolol continued and amiodarone started.  Plan to follow-up with Dr. Geraldo Pitter as outpatient to consider if Watchman device needed. Currently on aspirin 81.  Patient has no further GI bleeding. Discussed with cardiology Dr. Julianne Rice,  Eliquis started (approved by GI).    Bipolar disorder  Safety concerns Wife states that she believes that there are guns are all  locked up.  She does have concerns as patient has tried to grab the steering well when they are in traffic and has made comments regarding ending his life.  However he is also stated that he loves life and would never do anything to hurt himself. He has had ECT in the past that has worked well for him  Psychiatry consulted and recommended IVC  and ECT for which cardiology has evaluated patient as moderate cardiac risk Pending discharge to [facility] facility Continue Seroquel and Lamictal  Appreciate help from psych team   Hypothyroidism TSH 28.982, FT4 0.98, FT3 pending On home synthroid, compliant with meds per wife Now on Synthroid 125 Recently started on amiodarone High TSH was discussed with cardiology.  Recommend follow-up labs in 2 to 3 months.  Hypertension Stable BP goal less than 180/105 Long term BP goal normotensive  Hyperlipidemia Home meds: Crestor 10 LDL 40, goal < 70 Now on Crestor 10, no high intensity statin given LDL at goal and advanced age Continue statin at discharge  Other Stroke Risk Factors Advanced age  Other Active Problems Dementia Throat cancer status post chemotherapy and radiation AKI - Cre 1.58->1.72->1.84->1.70 - on IVF, monitoring in am  Hospital day # 5  ,*Please note that this is a verbal dictation therefore any spelling or grammatical errors are due to the "[employer]" system interpretation.   Pt seen by Neuro NP/APP and later by MD. Note/plan to be edited by MD as needed.    Otelia Santee, DNP, AGACNP-BC [facility] Please use AMION for pager and EPIC for messaging  ATTENDING NOTE: I reviewed above note and agree with the assessment and plan. Pt was seen and examined.   Pt was walking with PT in the hallway, no acute neuro change overnight, neuro stable. Still pending [facility] facility transfer. Discussed with Dr. Marlou Porch cardiology about hight TSH, given normal FT4, will follow up with repeat labs in 2-3 months.   For  detailed assessment and plan, please refer to above/below as I have made changes wherever appropriate.   Rosalin Hawking, MD PhD Stroke Neurology 08/01/2022 4:40 PM    To contact Stroke Continuity provider, please refer to [email or web address] After hours, contact General Neurology

## 2022-08-01 NOTE — Progress Notes (Signed)
Physical Therapy Treatment Patient Details Name: [name] MRN: [id number] DOB: [birth year] Today's Date: 08/01/2022   History of Present Illness 82 y.o. M admitted on 07/27/22 from [facility] following an acute onset of R sided weakness, slurred speech, and confusion. Pt received TPN at 1012a on 07/27/22. CT and MRI are negative. PMH includes: dementia, HTN, afib on warfarin, bipolar disorder, cataracts, CAD, MI, throat cancer s/p chemo and radiation.    PT Comments    Pt calm and cooperative during session today and progressing towards his physical therapy goals. Challenged balance without AD today with good performance; pt ambulating 400 ft with HHA. Able to perform varying gait speeds, stepping over/around obstacles, head turns. Slower gait speed for age. Would still recommend RW use for increased independence and stability. Could trial cane next session.   SATURATION QUALIFICATIONS: (This note is used to comply with regulatory documentation for home oxygen)  Patient Saturations on Room Air at Rest = 99%  Patient Saturations on Room Air while Ambulating = 97%  Patient Saturations on 0 Liters of oxygen while Ambulating = N/A  Please briefly explain why patient needs home oxygen: Pt does not require oxygen   Recommendations for follow up therapy are one component of a multi-disciplinary discharge planning process, led by the attending physician.  Recommendations may be updated based on patient status, additional functional criteria and insurance authorization.  Follow Up Recommendations  Home health PT (at [facility] if available)     Assistance Recommended at Discharge Frequent or constant Supervision/Assistance  Patient can return home with the following A little help with walking and/or transfers;Assist for transportation;Help with stairs or ramp for entrance;A little help with bathing/dressing/bathroom   Equipment Recommendations  None recommended by PT     Recommendations for Other Services       Precautions / Restrictions Precautions Precautions: Fall Restrictions Weight Bearing Restrictions: No     Mobility  Bed Mobility               General bed mobility comments: Sitting EOB upon arrival    Transfers Overall transfer level: Needs assistance Equipment used: None Transfers: Sit to/from Stand Sit to Stand: Supervision           General transfer comment: supervision for safety    Ambulation/Gait Ambulation/Gait assistance: Min guard Gait Distance (Feet): 400 Feet Assistive device: 1 person hand held assist Gait Pattern/deviations: Step-through pattern, Decreased stride length Gait velocity: decreased     General Gait Details: Slowed pace, min guard via HHA for balance/stability, worked on head turns, varying gait speed, stepping over/around obstacles with no overt LOB   Stairs             Wheelchair Mobility    Modified Rankin (Stroke Patients Only) Modified Rankin (Stroke Patients Only) Pre-Morbid Rankin Score: No symptoms Modified Rankin: Moderate disability     Balance Overall balance assessment: Needs assistance Sitting-balance support: Feet supported Sitting balance-Leahy Scale: Good     Standing balance support: During functional activity, No upper extremity supported Standing balance-Leahy Scale: Fair Standing balance comment: can maintain static balance without UE support                            Cognition Arousal/Alertness: Awake/alert Behavior During Therapy: WFL for tasks assessed/performed Overall Cognitive Status: History of cognitive impairments - at baseline  General Comments: pleasant and calm today        Exercises      General Comments        Pertinent Vitals/Pain Pain Assessment Pain Assessment: Faces Faces Pain Scale: Hurts a little bit Pain Location: BLE's Pain Descriptors / Indicators:  Aching, Discomfort Pain Intervention(s): Monitored during session    Home Living                          Prior Function            PT Goals (current goals can now be found in the care plan section) Acute Rehab PT Goals Patient Stated Goal: none stated PT Goal Formulation: Patient unable to participate in goal setting Time For Goal Achievement: 08/11/22 Potential to Achieve Goals: Good Progress towards PT goals: Progressing toward goals    Frequency    Min 3X/week      PT Plan Current plan remains appropriate    Co-evaluation              AM-PAC PT "6 Clicks" Mobility   Outcome Measure  Help needed turning from your back to your side while in a flat bed without using bedrails?: A Little Help needed moving from lying on your back to sitting on the side of a flat bed without using bedrails?: A Little Help needed moving to and from a bed to a chair (including a wheelchair)?: A Little Help needed standing up from a chair using your arms (e.g., wheelchair or bedside chair)?: A Little Help needed to walk in hospital room?: A Little Help needed climbing 3-5 steps with a railing? : A Little 6 Click Score: 18    End of Session Equipment Utilized During Treatment: Gait belt Activity Tolerance: Patient tolerated treatment well Patient left: with call bell/phone within reach;in bed;with nursing/sitter in room Nurse Communication: Mobility status PT Visit Diagnosis: Muscle weakness (generalized) (M62.81);Other abnormalities of gait and mobility (R26.89)     Time: 1012-1027 PT Time Calculation (min) (ACUTE ONLY): 15 min  Charges:  $Therapeutic Activity: 8-22 mins                     Wyona Almas, PT, DPT Acute Rehabilitation Services Office [phone]    [name] 08/01/2022, 10:52 AM

## 2022-08-01 NOTE — Progress Notes (Signed)
Occupational Therapy Treatment Patient Details Name: [name] MRN: [id number] DOB: 1940/10/27 Today's Date: 08/01/2022   History of present illness 82 y.o. M admitted on 07/27/22 from [facility] following an acute onset of R sided weakness, slurred speech, and confusion. Pt received TPN at 1012a on 07/27/22. CT and MRI are negative. PMH includes: dementia, HTN, afib on warfarin, bipolar disorder, cataracts, CAD, MI, throat cancer s/p chemo and radiation.   OT comments  Patient uneasy about OT profession in session to date, stating, "you have been mistaken about my abilities and health". Patient willing to complete functional mobility, but is unable to complete divided attention in hallway without stopping (pausing to talk to OT each time). Patient requiring minimal redirection in hallway as he wanted to go talk to his doctor while MD was typing at nurses station. Patient with minimal frustration, but returning back to room. Patient declining ADL participation. Recommendation changed to behavioral health. OT will continue to follow.    Recommendations for follow up therapy are one component of a multi-disciplinary discharge planning process, led by the attending physician.  Recommendations may be updated based on patient status, additional functional criteria and insurance authorization.    Follow Up Recommendations  Other (comment) (Behavioral health)     Assistance Recommended at Discharge Frequent or constant Supervision/Assistance  Patient can return home with the following  Direct supervision/assist for medications management;Direct supervision/assist for financial management;Assistance with cooking/housework   Equipment Recommendations  None recommended by OT    Recommendations for Other Services      Precautions / Restrictions Precautions Precautions: Fall Restrictions Weight Bearing Restrictions: No       Mobility Bed Mobility Overal bed mobility: Needs  Assistance Bed Mobility: Supine to Sit     Supine to sit: Modified independent (Device/Increase time)          Transfers Overall transfer level: Needs assistance Equipment used: Rolling walker (2 wheels) Transfers: Sit to/from Stand Sit to Stand: Supervision           General transfer comment: supervision for safety     Balance Overall balance assessment: Needs assistance Sitting-balance support: Feet supported Sitting balance-Leahy Scale: Good     Standing balance support: During functional activity, No upper extremity supported Standing balance-Leahy Scale: Fair Standing balance comment: can maintain static balance without UE support                           ADL either performed or assessed with clinical judgement   ADL Overall ADL's : Needs assistance/impaired                                       General ADL Comments: Now at a set up level for ADLs, has completed all grooming tasks to date    Extremity/Trunk Assessment              Vision       Perception     Praxis      Cognition Arousal/Alertness: Awake/alert Behavior During Therapy: WFL for tasks assessed/performed Overall Cognitive Status: History of cognitive impairments - at baseline                                 General Comments: pleasant and calm today, frustrated that OT redirected him from speaking  to his doctor when doctor was working at the front desk        Exercises      Shoulder Instructions       General Comments      Pertinent Vitals/ Pain       Pain Assessment Pain Assessment: No/denies pain  Home Living                                          Prior Functioning/Environment              Frequency  Min 1X/week        Progress Toward Goals  OT Goals(current goals can now be found in the care plan section)  Progress towards OT goals: Progressing toward goals  Acute Rehab OT Goals Patient  Stated Goal: none stated OT Goal Formulation: With patient Time For Goal Achievement: 08/11/22 Potential to Achieve Goals: Good  Plan Discharge plan needs to be updated    Co-evaluation                 AM-PAC OT "6 Clicks" Daily Activity     Outcome Measure   Help from another person eating meals?: None Help from another person taking care of personal grooming?: None Help from another person toileting, which includes using toliet, bedpan, or urinal?: A Little Help from another person bathing (including washing, rinsing, drying)?: A Little Help from another person to put on and taking off regular upper body clothing?: A Little Help from another person to put on and taking off regular lower body clothing?: A Little 6 Click Score: 20    End of Session Equipment Utilized During Treatment: Gait belt;Rolling walker (2 wheels)  OT Visit Diagnosis: Unsteadiness on feet (R26.81);Muscle weakness (generalized) (M62.81);Other abnormalities of gait and mobility (R26.89)   Activity Tolerance Patient tolerated treatment well   Patient Left in bed;with call bell/phone within reach;with nursing/sitter in room;with family/visitor present (sitting EOB)   Nurse Communication Mobility status        Time: [id number] OT Time Calculation (min): 13 min  Charges: OT General Charges $OT Visit: 1 Visit OT Treatments $Self Care/Home Management : 8-22 mins  Corinne Ports E. Mendy Lapinsky, OTR/L Acute Rehabilitation Services [phone]   Ascencion Dike 08/01/2022, 2:19 PM
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
## 2022-08-02 ENCOUNTER — Encounter: Payer: Self-pay | Admitting: Psychiatry

## 2022-08-02 ENCOUNTER — Inpatient Hospital Stay
Admission: AD | Admit: 2022-08-02 | Discharge: 2022-08-10 | DRG: 885 | Disposition: A | Discharge status: Home / Self Care | Payer: Medicare Other | Source: Intra-hospital | Attending: Psychiatry | Admitting: Psychiatry

## 2022-08-02 ENCOUNTER — Other Ambulatory Visit: Payer: Self-pay

## 2022-08-02 DIAGNOSIS — N3944 Nocturnal enuresis: Secondary | ICD-10-CM | POA: Diagnosis present

## 2022-08-02 DIAGNOSIS — E039 Hypothyroidism, unspecified: Secondary | ICD-10-CM | POA: Diagnosis present

## 2022-08-02 DIAGNOSIS — F0390 Unspecified dementia without behavioral disturbance: Secondary | ICD-10-CM | POA: Diagnosis present

## 2022-08-02 DIAGNOSIS — N3281 Overactive bladder: Secondary | ICD-10-CM | POA: Diagnosis present

## 2022-08-02 DIAGNOSIS — I480 Paroxysmal atrial fibrillation: Secondary | ICD-10-CM | POA: Diagnosis present

## 2022-08-02 DIAGNOSIS — N1832 Chronic kidney disease, stage 3b: Secondary | ICD-10-CM | POA: Diagnosis present

## 2022-08-02 DIAGNOSIS — I129 Hypertensive chronic kidney disease with stage 1 through stage 4 chronic kidney disease, or unspecified chronic kidney disease: Secondary | ICD-10-CM | POA: Diagnosis present

## 2022-08-02 DIAGNOSIS — Z8719 Personal history of other diseases of the digestive system: Secondary | ICD-10-CM

## 2022-08-02 DIAGNOSIS — F314 Bipolar disorder, current episode depressed, severe, without psychotic features: Principal | ICD-10-CM | POA: Diagnosis present

## 2022-08-02 DIAGNOSIS — Z8673 Personal history of transient ischemic attack (TIA), and cerebral infarction without residual deficits: Secondary | ICD-10-CM | POA: Diagnosis not present

## 2022-08-02 DIAGNOSIS — F319 Bipolar disorder, unspecified: Secondary | ICD-10-CM | POA: Diagnosis not present

## 2022-08-02 DIAGNOSIS — Z87891 Personal history of nicotine dependence: Secondary | ICD-10-CM | POA: Diagnosis not present

## 2022-08-02 DIAGNOSIS — Z7901 Long term (current) use of anticoagulants: Secondary | ICD-10-CM | POA: Diagnosis not present

## 2022-08-02 DIAGNOSIS — E7800 Pure hypercholesterolemia, unspecified: Secondary | ICD-10-CM | POA: Diagnosis present

## 2022-08-02 DIAGNOSIS — Z85818 Personal history of malignant neoplasm of other sites of lip, oral cavity, and pharynx: Secondary | ICD-10-CM

## 2022-08-02 DIAGNOSIS — Z9282 Status post administration of tPA (rtPA) in a different facility within the last 24 hours prior to admission to current facility: Secondary | ICD-10-CM | POA: Diagnosis not present

## 2022-08-02 DIAGNOSIS — G459 Transient cerebral ischemic attack, unspecified: Secondary | ICD-10-CM | POA: Diagnosis not present

## 2022-08-02 DIAGNOSIS — Z79899 Other long term (current) drug therapy: Secondary | ICD-10-CM

## 2022-08-02 DIAGNOSIS — F410 Panic disorder [episodic paroxysmal anxiety] without agoraphobia: Secondary | ICD-10-CM | POA: Diagnosis present

## 2022-08-02 DIAGNOSIS — F3160 Bipolar disorder, current episode mixed, unspecified: Principal | ICD-10-CM | POA: Diagnosis present

## 2022-08-02 DIAGNOSIS — Z8546 Personal history of malignant neoplasm of prostate: Secondary | ICD-10-CM | POA: Diagnosis not present

## 2022-08-02 DIAGNOSIS — Z931 Gastrostomy status: Secondary | ICD-10-CM | POA: Diagnosis not present

## 2022-08-02 DIAGNOSIS — I2510 Atherosclerotic heart disease of native coronary artery without angina pectoris: Secondary | ICD-10-CM | POA: Diagnosis present

## 2022-08-02 DIAGNOSIS — Z79890 Hormone replacement therapy (postmenopausal): Secondary | ICD-10-CM

## 2022-08-02 LAB — CBC
HCT: 35.6 % — ABNORMAL LOW (ref 39.0–52.0)
Hemoglobin: 11.5 g/dL — ABNORMAL LOW (ref 13.0–17.0)
MCH: 28.8 pg (ref 26.0–34.0)
MCHC: 32.3 g/dL (ref 30.0–36.0)
MCV: 89.2 fL (ref 80.0–100.0)
Platelets: 142 10*3/uL — ABNORMAL LOW (ref 150–400)
RBC: 3.99 MIL/uL — ABNORMAL LOW (ref 4.22–5.81)
RDW: 15.8 % — ABNORMAL HIGH (ref 11.5–15.5)
WBC: 5.6 10*3/uL (ref 4.0–10.5)
nRBC: 0 % (ref 0.0–0.2)

## 2022-08-02 LAB — BASIC METABOLIC PANEL
Anion gap: 8 (ref 5–15)
BUN: 14 mg/dL (ref 8–23)
CO2: 25 mmol/L (ref 22–32)
Calcium: 8.9 mg/dL (ref 8.9–10.3)
Chloride: 107 mmol/L (ref 98–111)
Creatinine, Ser: 1.8 mg/dL — ABNORMAL HIGH (ref 0.61–1.24)
GFR, Estimated: 38 mL/min — ABNORMAL LOW (ref >60)
Glucose, Bld: 85 mg/dL (ref 70–99)
Potassium: 3.8 mmol/L (ref 3.5–5.1)
Sodium: 140 mmol/L (ref 135–145)

## 2022-08-02 LAB — GLUCOSE, CAPILLARY: Glucose-Capillary: 119 mg/dL — ABNORMAL HIGH (ref 70–99)

## 2022-08-02 LAB — T3, FREE: T3, Free: 1.2 pg/mL — ABNORMAL LOW (ref 2.0–4.4)

## 2022-08-02 MED ORDER — LORAZEPAM 1 MG PO TABS: 1.0000 mg | ORAL_TABLET | ORAL | Status: DC | PRN

## 2022-08-02 MED ORDER — MAGNESIUM HYDROXIDE 400 MG/5ML PO SUSP
30.0000 mL | Freq: Every day | ORAL | Status: DC | PRN
  Administered 2022-08-03 – 2022-08-04 (×2): 30 mL via ORAL
  Filled 2022-08-02 (×2): qty 30

## 2022-08-02 MED ORDER — FAMOTIDINE 20 MG PO TABS
20.0000 mg | ORAL_TABLET | Freq: Every day | ORAL | Status: DC
  Administered 2022-08-03 – 2022-08-10 (×8): 20 mg via ORAL
  Filled 2022-08-02 (×8): qty 1

## 2022-08-02 MED ORDER — ROSUVASTATIN CALCIUM 5 MG PO TABS
10.0000 mg | ORAL_TABLET | Freq: Every evening | ORAL | Status: DC
  Administered 2022-08-02 – 2022-08-09 (×8): 10 mg via ORAL
  Filled 2022-08-02 (×8): qty 2

## 2022-08-02 MED ORDER — PANTOPRAZOLE SODIUM 40 MG PO TBEC
40.0000 mg | DELAYED_RELEASE_TABLET | Freq: Every day | ORAL | Status: DC
  Administered 2022-08-03 – 2022-08-10 (×8): 40 mg via ORAL
  Filled 2022-08-02 (×8): qty 1

## 2022-08-02 MED ORDER — QUETIAPINE FUMARATE ER 50 MG PO TB24
200.0000 mg | ORAL_TABLET | Freq: Every day | ORAL | Status: DC
  Administered 2022-08-02 – 2022-08-09 (×8): 200 mg via ORAL
  Filled 2022-08-02 (×8): qty 4

## 2022-08-02 MED ORDER — RISPERIDONE 1 MG PO TBDP: 2.0000 mg | ORAL_TABLET | Freq: Three times a day (TID) | ORAL | Status: DC | PRN

## 2022-08-02 MED ORDER — APIXABAN 5 MG PO TABS
5.0000 mg | ORAL_TABLET | Freq: Two times a day (BID) | ORAL | Status: DC
  Administered 2022-08-02 – 2022-08-10 (×16): 5 mg via ORAL
  Filled 2022-08-02 (×16): qty 1

## 2022-08-02 MED ORDER — ZIPRASIDONE MESYLATE 20 MG IM SOLR: 20.0000 mg | INTRAMUSCULAR | Status: DC | PRN

## 2022-08-02 MED ORDER — POLYETHYLENE GLYCOL 3350 17 G PO PACK
17.0000 g | PACK | Freq: Every day | ORAL | Status: DC | PRN
  Administered 2022-08-03: 17 g via ORAL
  Filled 2022-08-02: qty 1

## 2022-08-02 MED ORDER — APIXABAN 5 MG PO TABS: 5.0000 mg | ORAL_TABLET | Freq: Two times a day (BID) | ORAL | 0 refills | Status: DC

## 2022-08-02 MED ORDER — LAMOTRIGINE 100 MG PO TABS
100.0000 mg | ORAL_TABLET | Freq: Every day | ORAL | Status: DC
  Administered 2022-08-02 – 2022-08-09 (×8): 100 mg via ORAL
  Filled 2022-08-02 (×8): qty 1

## 2022-08-02 MED ORDER — METOPROLOL TARTRATE 25 MG PO TABS
50.0000 mg | ORAL_TABLET | Freq: Two times a day (BID) | ORAL | Status: DC
  Administered 2022-08-02 – 2022-08-10 (×12): 50 mg via ORAL
  Filled 2022-08-02 (×16): qty 2

## 2022-08-02 MED ORDER — THIAMINE MONONITRATE 100 MG PO TABS
100.0000 mg | ORAL_TABLET | Freq: Every day | ORAL | Status: DC
  Administered 2022-08-03 – 2022-08-10 (×8): 100 mg via ORAL
  Filled 2022-08-02 (×8): qty 1

## 2022-08-02 MED ORDER — AMIODARONE HCL 200 MG PO TABS
200.0000 mg | ORAL_TABLET | Freq: Every day | ORAL | Status: DC
  Administered 2022-08-03 – 2022-08-10 (×8): 200 mg via ORAL
  Filled 2022-08-02 (×8): qty 1

## 2022-08-02 MED ORDER — LABETALOL HCL 5 MG/ML IV SOLN: 10.0000 mg | INTRAVENOUS | Status: DC | PRN

## 2022-08-02 MED ORDER — LIDOCAINE 5 % EX PTCH
2.0000 | MEDICATED_PATCH | CUTANEOUS | Status: DC
  Administered 2022-08-03 – 2022-08-10 (×7): 2 via TRANSDERMAL
  Filled 2022-08-02 (×8): qty 2

## 2022-08-02 MED ORDER — VITAMIN D 25 MCG (1000 UNIT) PO TABS
5000.0000 [IU] | ORAL_TABLET | Freq: Every day | ORAL | Status: DC
  Administered 2022-08-03 – 2022-08-10 (×8): 5000 [IU] via ORAL
  Filled 2022-08-02 (×8): qty 5

## 2022-08-02 MED ORDER — HYDROXYZINE HCL 25 MG PO TABS
25.0000 mg | ORAL_TABLET | Freq: Four times a day (QID) | ORAL | Status: DC | PRN
  Administered 2022-08-02 – 2022-08-05 (×5): 25 mg via ORAL
  Filled 2022-08-02 (×5): qty 1

## 2022-08-02 MED ORDER — LEVOTHYROXINE SODIUM 75 MCG PO TABS
125.0000 ug | ORAL_TABLET | Freq: Every day | ORAL | Status: DC
  Administered 2022-08-03 – 2022-08-10 (×7): 125 ug via ORAL
  Filled 2022-08-02 (×7): qty 1

## 2022-08-02 MED ORDER — ACETAMINOPHEN 325 MG PO TABS
650.0000 mg | ORAL_TABLET | Freq: Four times a day (QID) | ORAL | Status: DC | PRN
  Administered 2022-08-02 – 2022-08-08 (×8): 650 mg via ORAL
  Filled 2022-08-02 (×8): qty 2

## 2022-08-02 MED ORDER — ALUM & MAG HYDROXIDE-SIMETH 200-200-20 MG/5ML PO SUSP
30.0000 mL | ORAL | Status: DC | PRN
  Administered 2022-08-09: 30 mL via ORAL
  Filled 2022-08-02: qty 30

## 2022-08-02 NOTE — Discharge Instructions (Signed)

## 2022-08-02 NOTE — Progress Notes (Signed)
Mobility Specialist: Progress Note   08/02/22 1046  Mobility  Activity Ambulated with assistance in hallway  Level of Assistance Minimal assist, patient does 75% or more  Assistive Device None  Distance Ambulated (ft) 500 ft  Activity Response Tolerated well  Mobility Referral Yes  $Mobility charge 1 Mobility   Pre-Mobility: 63 HR, 96% SpO2 Post-Mobility: 67 HR, 92% SpO2  Pt received in the bed and agreeable to mobility. MinA with bed mobility and during ambulation for balance as pt has tendency to drift Lt/Rt. C/o BLE pain, no rating given. Pt sitting EOB after session with call bell and phone in reach.   [place] [name] Mobility Specialist Please contact via SecureChat or Rehab office at [phone]
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
## 2022-08-02 NOTE — Progress Notes (Signed)
Patient refused to sign any documents upon admission.  The patient did give verbal permission to have RN write his brother's name down as his emergency contact.

## 2022-08-02 NOTE — Plan of Care (Signed)
  Problem: Education: Goal: Knowledge of General Education information will improve Description: Including pain rating scale, medication(s)/side effects and non-pharmacologic comfort measures Outcome: Progressing   Problem: Health Behavior/Discharge Planning: Goal: Ability to manage health-related needs will improve Outcome: Progressing   Problem: Clinical Measurements: Goal: Ability to maintain clinical measurements within normal limits will improve Outcome: Progressing Goal: Will remain free from infection Outcome: Progressing Goal: Diagnostic test results will improve Outcome: Progressing Goal: Respiratory complications will improve Outcome: Progressing Goal: Cardiovascular complication will be avoided Outcome: Progressing   Problem: Ischemic Stroke/TIA Tissue Perfusion: Goal: Complications of ischemic stroke/TIA will be minimized Outcome: Progressing   Problem: Education: Goal: Knowledge of disease or condition will improve Outcome: Progressing Goal: Knowledge of secondary prevention will improve (MUST DOCUMENT ALL) Outcome: Progressing Goal: Knowledge of patient specific risk factors will improve [place]ta [place] N/A or DELETE if not current risk factor) Outcome: Progressing   Problem: Safety: Goal: Periods of time without injury will increase Outcome: Progressing   Problem: Physical Regulation: Goal: Ability to maintain clinical measurements within normal limits will improve Outcome: Progressing

## 2022-08-02 NOTE — Tx Team (Signed)
Initial Treatment Plan 08/02/2022 6:41 PM [name] [id number]    PATIENT STRESSORS: Marital or family conflict     PATIENT STRENGTHS: Communication skills  Supportive family/friends    PATIENT IDENTIFIED PROBLEMS: na                     DISCHARGE CRITERIA:  Ability to meet basic life and health needs Improved stabilization in mood, thinking, and/or behavior  PRELIMINARY DISCHARGE PLAN: Return to previous living arrangement  PATIENT/FAMILY INVOLVEMENT: This treatment plan has been presented to and reviewed with the patient, [name]. The patient has been given the opportunity to ask questions and make suggestions.  Nolon Bussing, RN 08/02/2022, 6:41 PM

## 2022-08-02 NOTE — TOC Transition Note (Signed)
Transition of Care [facility]) - CM/SW Discharge Note   Patient Details  Name: [name] MRN: [id number] Date of Birth: [birth year]  [facility] CM/SW Contact:  Geralynn Ochs, LCSW Phone Number: 08/02/2022, 3:42 PM   Clinical Narrative:   Patient discharging to [facility] today. CSW notified sheriff for transportation, sent IVC paperwork to [facility] for their review. Patient admitting to room L 26. No further TOC needs at this time.    Final next level of care: Psychiatric Hospital Barriers to Discharge: Barriers Resolved   Patient Goals and CMS Choice CMS Medicare.gov Compare Post Acute Care list provided to:: Patient Represenative (must comment) Choice offered to / list presented to : Spouse  Discharge Placement                         Discharge Plan and Services Additional resources added to the After Visit Summary for     Discharge Planning Services: CM Consult Post Acute Care Choice: Home Health                    HH Arranged: PT, OT, RN [facility] Agency: [facility] Date [place]: 07/30/22   Representative spoke with at [place]: [place] Determinants of Health ([place]) Interventions SDOH Screenings   Food Insecurity: No Food Insecurity (07/30/2022)  Housing: Low Risk  (07/30/2022)  Transportation Needs: No Transportation Needs (07/30/2022)  Utilities: Not At Risk (07/30/2022)  Depression (PHQ2-9): Low Risk  (10/31/2021)  Tobacco Use: Medium Risk (07/31/2022)     Readmission Risk Interventions     No data to display

## 2022-08-02 NOTE — Plan of Care (Addendum)
Pt is discharging to [facility]. PIV removed. All personal belongings returned to the patient. Hand off report given to Amy RN.  Sheriff escorted the patient to [facility] and left the unit at 1630.  Problem: Education: Goal: Knowledge of General Education information will improve Description: Including pain rating scale, medication(s)/side effects and non-pharmacologic comfort measures Outcome: Completed/Met   Problem: Health Behavior/Discharge Planning: Goal: Ability to manage health-related needs will improve Outcome: Completed/Met   Problem: Clinical Measurements: Goal: Ability to maintain clinical measurements within normal limits will improve Outcome: Completed/Met Goal: Will remain free from infection Outcome: Completed/Met Goal: Diagnostic test results will improve Outcome: Completed/Met Goal: Respiratory complications will improve Outcome: Completed/Met Goal: Cardiovascular complication will be avoided Outcome: Completed/Met   Problem: Activity: Goal: Risk for activity intolerance will decrease Outcome: Completed/Met   Problem: Nutrition: Goal: Adequate nutrition will be maintained Outcome: Completed/Met   Problem: Coping: Goal: Level of anxiety will decrease Outcome: Completed/Met   Problem: Elimination: Goal: Will not experience complications related to bowel motility Outcome: Completed/Met Goal: Will not experience complications related to urinary retention Outcome: Completed/Met   Problem: Pain Managment: Goal: General experience of comfort will improve Outcome: Completed/Met   Problem: Safety: Goal: Ability to remain free from injury will improve Outcome: Completed/Met   Problem: Skin Integrity: Goal: Risk for impaired skin integrity will decrease Outcome: Completed/Met   Problem: Education: Goal: Knowledge of disease or condition will improve Outcome: Completed/Met Goal: Knowledge of secondary prevention will improve (MUST DOCUMENT  ALL) Outcome: Completed/Met Goal: Knowledge of patient specific risk factors will improve [place] N/A or DELETE if not current risk factor) Outcome: Completed/Met   Problem: Ischemic Stroke/TIA Tissue Perfusion: Goal: Complications of ischemic stroke/TIA will be minimized Outcome: Completed/Met   Problem: Coping: Goal: Will verbalize positive feelings about self Outcome: Completed/Met Goal: Will identify appropriate support needs Outcome: Completed/Met   Problem: Health Behavior/Discharge Planning: Goal: Ability to manage health-related needs will improve Outcome: Completed/Met Goal: Goals will be collaboratively established with patient/family Outcome: Completed/Met   Problem: Self-Care: Goal: Ability to participate in self-care as condition permits will improve Outcome: Completed/Met Goal: Verbalization of feelings and concerns over difficulty with self-care will improve Outcome: Completed/Met Goal: Ability to communicate needs accurately will improve Outcome: Completed/Met   Problem: Nutrition: Goal: Risk of aspiration will decrease Outcome: Completed/Met Goal: Dietary intake will improve Outcome: Completed/Met

## 2022-08-02 NOTE — Plan of Care (Signed)
  Problem: Education: Goal: Knowledge of General Education information will improve Description: Including pain rating scale, medication(s)/side effects and non-pharmacologic comfort measures Outcome: Progressing   Problem: Coping: Goal: Level of anxiety will decrease Outcome: Progressing   Problem: Safety: Goal: Ability to remain free from injury will improve Outcome: Progressing   Problem: Education: Goal: Knowledge of disease or condition will improve Outcome: Progressing   Problem: Ischemic Stroke/TIA Tissue Perfusion: Goal: Complications of ischemic stroke/TIA will be minimized Outcome: Progressing

## 2022-08-02 NOTE — Progress Notes (Addendum)
   08/02/22 2200  Psych Admission Type (Psych Patients Only)  Admission Status Involuntary  Psychosocial Assessment  Patient Complaints Anxiety  Eye Contact Fair  Facial Expression Anxious  Affect Anxious  Speech Logical/coherent  Interaction Assertive  Motor Activity Slow (uses wheelchair for safety-walks with care)  Appearance/Hygiene Unremarkable;In scrubs  Behavior Characteristics Cooperative;Anxious  Mood Anxious;Pleasant  Thought Process  Coherency WDL  Content WDL  Delusions None reported or observed  Perception WDL  Hallucination None reported or observed  Judgment Impaired  Confusion Mild  Danger to Self  Current suicidal ideation? Denies  Self-Injurious Behavior No self-injurious ideation or behavior indicators observed or expressed   Agreement Not to Harm Self Yes  Description of Agreement verbal  Danger to Others  Danger to Others None reported or observed   Progress note   D: Pt seen at nurse's station. Pt alert and oriented. Pt denies SI, HI, AVH. Pt contracts for safety, Pt rates pain  9/10. States that pain is in bilateral thighs. Said his pain started when he went into the hospital for a stroke. "I didn't have this pain before then. Maybe it's because they stopped the oxygen. That's what I heard them saying."  Pt rates anxiety  5/10 and depression  0/10. Pt states this is his first time inpatient. Pt oriented to the unit procedures. All questions answered. Pt somewhat forgetful about where his room is but is redirectable.  No other concerns noted at this time.  A: Pt provided support and encouragement. Pt given scheduled medication as prescribed. PRNs as appropriate. Q15 min checks for safety.   R: Pt safe on the unit. Will continue to monitor.

## 2022-08-02 NOTE — Progress Notes (Signed)
Pt. Nurse Baxter Hire M.) forgot to print the AVS. Patient left without Discharge papers. Patient had to be D/C out of  system prior to arriving to the [facility]. We could not take the patient out of the system without printing the AVS. The RN Roderic Ovens S.) print the AVS so he could be DC from the sysytem.
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
## 2022-08-03 DIAGNOSIS — F314 Bipolar disorder, current episode depressed, severe, without psychotic features: Secondary | ICD-10-CM

## 2022-08-03 NOTE — [facility] Suicide Risk Assessment (Cosign Needed Addendum)
Suicide Risk Assessment   [facility] Admission Suicide Risk Assessment   Nursing information obtained from:  Patient Demographic factors:  Male, Age 82 or older Current Mental Status:  married Loss Factors:  physical health Historical Factors:  suicide attempts Risk Reduction Factors:  married  Total Time spent with patient: 1 hour Principal Problem: Bipolar affective disorder, depressed, severe ([place]) Diagnosis:  Principal Problem:   Bipolar affective disorder, depressed, severe ([place]) Active Problems:   Dementia without behavioral disturbance ([place])  Subjective Data: "I need help with my bladder issues."  82 yo male with bipolar disorder with depression who initially wanted ECT, declines at this time and reports his depression as mild with high anxiety, denies suicidal ideations.  He does have panic attacks at times.  Sleep is "not great" because of his incontinence and awakening to go to the bathroom, appetite is fair.  Continued Clinical Symptoms:  Alcohol Use Disorder Identification Test Final Score (AUDIT): 0 The "Alcohol Use Disorders Identification Test", Guidelines for Use in Primary Care, Second Edition.  World Pharmacologist [facility]). Score between 0-7:  no or low risk or alcohol related problems. Score between 8-15:  moderate risk of alcohol related problems. Score between 16-19:  high risk of alcohol related problems. Score 20 or above:  warrants further diagnostic evaluation for alcohol dependence and treatment.   CLINICAL FACTORS:   Depression, confusion at times, anxiety   Musculoskeletal: Strength & Muscle Tone: within normal limits Gait & Station: normal Patient leans: N/A  Psychiatric Specialty Exam: Physical Exam Vitals and nursing note reviewed.  Constitutional:      Appearance: Normal appearance.  HENT:     Head: Normocephalic.     Nose: Nose normal.  Pulmonary:     Effort: Pulmonary effort is normal.  Musculoskeletal:        General: Normal range of  motion.     Cervical back: Normal range of motion.  Neurological:     General: No focal deficit present.     Mental Status: He is alert and oriented to person, place, and time.  Psychiatric:        Attention and Perception: Attention and perception normal.        Mood and Affect: Mood is anxious and depressed.        Speech: Speech normal.        Behavior: Behavior normal. Behavior is cooperative.        Thought Content: Thought content normal.        Cognition and Memory: Memory is impaired.        Judgment: Judgment is impulsive.     Review of Systems  Psychiatric/Behavioral:  Positive for depression. The patient is nervous/anxious.   All other systems reviewed and are negative.   Blood pressure (!) 144/78, pulse 69, temperature 97.6 F (36.4 C), temperature source Oral, resp. rate 18, height '5\' 7"'$  (1.702 m), weight 89 kg, SpO2 100 %.Body mass index is 30.73 kg/m.  General Appearance: Casual  Eye Contact:  Good  Speech:  Normal Rate  Volume:  Normal  Mood:  Anxious and Depressed  Affect:  Congruent  Thought Process:  Coherent  Orientation:  Full (Time, Place, and Person)  Thought Content:  Rumination  Suicidal Thoughts:  No  Homicidal Thoughts:  No  Memory:  Immediate;   Fair Recent;   Fair Remote;   Fair  Judgement:  Fair  Insight:  Fair  Psychomotor Activity:  Normal  Concentration:  Concentration: Fair and Attention Span: Fair  Recall:  [place] of Knowledge:  Good  Language:  Good  Akathisia:  No  Handed:  Right  AIMS (if indicated):     Assets:  Housing Leisure Time Resilience Social Support  ADL's:  Intact  Cognition:  Impaired,  Mild  Sleep:         Physical Exam: Physical Exam Vitals and nursing note reviewed.  Constitutional:      Appearance: Normal appearance.  HENT:     Head: Normocephalic.     Nose: Nose normal.  Pulmonary:     Effort: Pulmonary effort is normal.  Musculoskeletal:        General: Normal range of motion.     Cervical  back: Normal range of motion.  Neurological:     General: No focal deficit present.     Mental Status: He is alert and oriented to person, place, and time.  Psychiatric:        Attention and Perception: Attention and perception normal.        Mood and Affect: Mood is anxious and depressed.        Speech: Speech normal.        Behavior: Behavior normal. Behavior is cooperative.        Thought Content: Thought content normal.        Cognition and Memory: Memory is impaired.        Judgment: Judgment is impulsive.    Review of Systems  Psychiatric/Behavioral:  Positive for depression. The patient is nervous/anxious.   All other systems reviewed and are negative.  Blood pressure (!) 144/78, pulse 69, temperature 97.6 F (36.4 C), temperature source Oral, resp. rate 18, height '5\' 7"'$  (1.702 m), weight 89 kg, SpO2 100 %. Body mass index is 30.73 kg/m.   COGNITIVE FEATURES THAT CONTRIBUTE TO RISK:  None    SUICIDE RISK:   Mild:  Suicidal ideation of limited frequency, intensity, duration, and specificity.  There are no identifiable plans, no associated intent, mild dysphoria and related symptoms, good self-control (both objective and subjective assessment), few other risk factors, and identifiable protective factors, including available and accessible social support.  PLAN OF CARE:  Bipolar affective disorder, depressed, severe without psychosis: Lamictal 100 mg daily Seroquel 200 mg at bedtime  Anxiety: Hydroxyzine 10 mg every six hours PRN anxiety  I certify that inpatient services furnished can reasonably be expected to improve the patient's condition.   Waylan Boga, NP 08/03/2022, 3:09 PM

## 2022-08-03 NOTE — Progress Notes (Signed)
Pt belongings locked up in locker #10. Pt refused to sign belongings sheet.

## 2022-08-03 NOTE — BH IP Treatment Plan (Signed)
Interdisciplinary Treatment and Diagnostic Plan Update  08/03/2022 Time of Session: 9:30AM Avaan Vink III MRN: [id number]  Principal Diagnosis: Bipolar disorder, mixed ([place])  Secondary Diagnoses: Principal Problem:   Bipolar disorder, mixed ([place])   Current Medications:  Current Facility-Administered Medications  Medication Dose Route Frequency Provider Last Rate Last Admin   acetaminophen (TYLENOL) tablet 650 mg  650 mg Oral Q6H PRN Clapacs, Madie Reno, MD   650 mg at 08/03/22 0927   alum & mag hydroxide-simeth (MAALOX/MYLANTA) 200-200-20 MG/5ML suspension 30 mL  30 mL Oral Q4H PRN Clapacs, Madie Reno, MD       amiodarone (PACERONE) tablet 200 mg  200 mg Oral Daily Clapacs, Boston T, MD   200 mg at 08/03/22 [id number]   apixaban (ELIQUIS) tablet 5 mg  5 mg Oral BID Clapacs, Madie Reno, MD   5 mg at 08/03/22 [id number]   cholecalciferol (VITAMIN D3) 25 MCG (1000 UNIT) tablet 5,000 Units  5,000 Units Oral Daily Clapacs, Madie Reno, MD   5,000 Units at 08/03/22 [id number]   famotidine (PEPCID) tablet 20 mg  20 mg Oral Daily Clapacs, Madie Reno, MD   20 mg at 08/03/22 [id number]   hydrOXYzine (ATARAX) tablet 25 mg  25 mg Oral Q6H PRN Clapacs, Madie Reno, MD   25 mg at 08/03/22 [id number]   labetalol (NORMODYNE) injection 10 mg  10 mg Intravenous Q2H PRN Clapacs, Madie Reno, MD       lamoTRIgine (LAMICTAL) tablet 100 mg  100 mg Oral QHS Clapacs, Jonavan T, MD   100 mg at 08/02/22 2127   levothyroxine (SYNTHROID) tablet 125 mcg  125 mcg Oral Daily Clapacs, Madie Reno, MD   125 mcg at 08/03/22 0926   lidocaine (LIDODERM) 5 % 2 patch  2 patch Transdermal Q24H Clapacs, Elhadji T, MD       risperiDONE (RISPERDAL M-TABS) disintegrating tablet 2 mg  2 mg Oral Q8H PRN Clapacs, Madie Reno, MD       And   LORazepam (ATIVAN) tablet 1 mg  1 mg Oral PRN Clapacs, Madie Reno, MD       And   ziprasidone (GEODON) injection 20 mg  20 mg Intramuscular PRN Clapacs, Madie Reno, MD       magnesium hydroxide (MILK OF MAGNESIA) suspension 30 mL  30 mL Oral Daily PRN Clapacs, Brack T, MD    30 mL at 08/03/22 0932   metoprolol tartrate (LOPRESSOR) tablet 50 mg  50 mg Oral BID Clapacs, Clara T, MD   50 mg at 08/03/22 0926   pantoprazole (PROTONIX) EC tablet 40 mg  40 mg Oral Daily Clapacs, Madie Reno, MD   40 mg at 08/03/22 [id number]   polyethylene glycol (MIRALAX / GLYCOLAX) packet 17 g  17 g Oral Daily PRN Clapacs, Madie Reno, MD   17 g at 08/03/22 [id number]   QUEtiapine (SEROQUEL XR) 24 hr tablet 200 mg  200 mg Oral QHS Clapacs, Nickolai T, MD   200 mg at 08/02/22 2127   rosuvastatin (CRESTOR) tablet 10 mg  10 mg Oral QPM Clapacs, Madie Reno, MD   10 mg at 08/02/22 1900   thiamine (VITAMIN B1) tablet 100 mg  100 mg Oral Daily Clapacs, Madie Reno, MD   100 mg at 08/03/22 [id number]   PTA Medications: Medications Prior to Admission  Medication Sig Dispense Refill Last Dose   amiodarone (PACERONE) 200 MG tablet Take 1 tablet (200 mg total) by mouth 2 (two) times daily for 7 days, THEN 1 tablet (200 mg  total) daily. (Patient taking differently: 200 mg once daily) 44 tablet 0    apixaban (ELIQUIS) 5 MG TABS tablet Take 1 tablet (5 mg total) by mouth 2 (two) times daily. 60 tablet 0    lamoTRIgine (LAMICTAL) 100 MG tablet Take 100 mg by mouth at bedtime.      levothyroxine (SYNTHROID) 125 MCG tablet Take 125 mcg by mouth daily.      LORazepam (ATIVAN) 1 MG tablet Take 1 mg by mouth 2 (two) times daily.      metoprolol tartrate (LOPRESSOR) 50 MG tablet Take 1 tablet (50 mg total) by mouth 2 (two) times daily. 180 tablet 3    omeprazole (PRILOSEC OTC) 20 MG tablet Take 20 mg by mouth 2 (two) times daily.      polyethylene glycol (MIRALAX / GLYCOLAX) 17 g packet Take 17 g by mouth daily as needed for constipation.      QUEtiapine (SEROQUEL XR) 200 MG 24 hr tablet Take 200 mg by mouth at bedtime.      rosuvastatin (CRESTOR) 10 MG tablet Take 10 mg by mouth every evening.       Patient Stressors: Marital or family conflict    Patient Strengths: Armed forces logistics/support/administrative officer  Supportive family/friends   Treatment Modalities:  Medication Management, Group therapy, Case management,  1 to 1 session with clinician, Psychoeducation, Recreational therapy.   Physician Treatment Plan for Primary Diagnosis: Bipolar disorder, mixed ([place]) Long Term Goal(s):     Short Term Goals:    Medication Management: Evaluate patient's response, side effects, and tolerance of medication regimen.  Therapeutic Interventions: 1 to 1 sessions, Unit Group sessions and Medication administration.  Evaluation of Outcomes: Not Met  Physician Treatment Plan for Secondary Diagnosis: Principal Problem:   Bipolar disorder, mixed ([employer])  Long Term Goal(s):     Short Term Goals:       Medication Management: Evaluate patient's response, side effects, and tolerance of medication regimen.  Therapeutic Interventions: 1 to 1 sessions, Unit Group sessions and Medication administration.  Evaluation of Outcomes: Not Met   RN Treatment Plan for Primary Diagnosis: Bipolar disorder, mixed ([place]) Long Term Goal(s): Knowledge of disease and therapeutic regimen to maintain health will improve  Short Term Goals: Ability to remain free from injury will improve, Ability to verbalize frustration and anger appropriately will improve, Ability to demonstrate self-control, Ability to participate in decision making will improve, Ability to verbalize feelings will improve, Ability to identify and develop effective coping behaviors will improve, and Compliance with prescribed medications will improve  Medication Management: RN will administer medications as ordered by provider, will assess and evaluate patient's response and provide education to patient for prescribed medication. RN will report any adverse and/or side effects to prescribing provider.  Therapeutic Interventions: 1 on 1 counseling sessions, Psychoeducation, Medication administration, Evaluate responses to treatment, Monitor vital signs and CBGs as ordered, Perform/monitor CIWA, COWS, AIMS and Fall Risk  screenings as ordered, Perform wound care treatments as ordered.  Evaluation of Outcomes: Not Met   LCSW Treatment Plan for Primary Diagnosis: Bipolar disorder, mixed ([place]) Long Term Goal(s): Safe transition to appropriate next level of care at discharge, Engage patient in therapeutic group addressing interpersonal concerns.  Short Term Goals: Engage patient in aftercare planning with referrals and resources, Increase social support, Increase ability to appropriately verbalize feelings, Increase emotional regulation, Facilitate acceptance of mental health diagnosis and concerns, and Increase skills for wellness and recovery  Therapeutic Interventions: Assess for all discharge needs, 1 to 1 time  with Social worker, Explore available resources and support systems, Assess for adequacy in community support network, Educate family and significant other(s) on suicide prevention, Complete Psychosocial Assessment, Interpersonal group therapy.  Evaluation of Outcomes: Not Met   Progress in Treatment: Attending groups: No. Participating in groups: No. Taking medication as prescribed: Yes. Toleration medication: Yes. Family/Significant other contact made: No, will contact:  when given permission Patient understands diagnosis: No. Discussing patient identified problems/goals with staff: Yes. Medical problems stabilized or resolved: Yes. Denies suicidal/homicidal ideation: Yes. Issues/concerns per patient self-inventory: No. Other: None  New problem(s) identified: No, Describe:  None  New Short Term/Long Term Goal(s): Patient to work towards medication management for mood stabilization; development of comprehensive mental wellness plan.   Patient Goals:  "I have anxiety and can't sleep because of my bladder issues"  Discharge Plan or Barriers: CSW will assist pt with development of appropriate discharge/aftercare plan.   Reason for Continuation of Hospitalization: Anxiety Depression Medical  Issues Medication stabilization  Estimated Length of Stay: 1-7 days  Last [employer] Suicide Severity Risk Score: Flowsheet Row Admission (Current) from 08/02/2022 in [facility] ED to Hosp-Admission (Discharged) from 07/05/2022 in [facility] No Risk No Risk       Last PHQ 2/9 Scores:    10/31/2021    2:41 PM 10/31/2021    2:25 PM 01/19/2020    1:58 PM  Depression screen PHQ 2/9  Decreased Interest 0 0 0  Down, Depressed, Hopeless 0 0 0  PHQ - 2 Score 0 0 0    Scribe for Treatment Team: Alee Katen A [place], [name] 08/03/2022 10:28 AM

## 2022-08-03 NOTE — Plan of Care (Signed)
Problem: Group Participation Goal: STG - Patient will engage in groups without prompting or encouragement from LRT x3 group sessions within 5 recreation therapy group sessions Description: STG - Patient will engage in groups without prompting or encouragement from LRT x3 group sessions within 5 recreation therapy group sessions Outcome: Not Applicable    Peniel Biel LRT, CTRS
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
## 2022-08-03 NOTE — [facility] Counselor (Addendum)
CSW made first attempt to complete pt's PSA.   However pt's dinner tray arrived and CSW was unable to complete assessment.   CSW will follow up at another more opportune time to complete pt PSA.   Iker Nuttall [place], MSW, LCSW-A 2/23/20244:18 PM

## 2022-08-03 NOTE — H&P (Signed)
Psychiatric Admission Assessment Adult  Patient Identification: [name] MRN:  [id number] Date of Evaluation:  08/03/2022 Chief Complaint:  Bipolar disorder, mixed ([place]) [F31.60] Principal Diagnosis: Bipolar affective disorder, depressed, severe ([place]) Diagnosis:  Principal Problem:   Bipolar affective disorder, depressed, severe ([place]) Active Problems:   Dementia without behavioral disturbance ([place])  History of Present Illness:  82 yo male transferred from [facility] after being treated for a stroke, history of dementia.  He was staying with his brother as his wife could no longer care for him.  He was admitted to [facility] with bipolar disorder with depression who initially wanted ECT, declines at this time and reports his depression as mild with high anxiety, denies suicidal ideations.  He does have panic attacks at times.  Sleep is "not great" because of his incontinence and awakening to go to the bathroom, appetite is fair.  He would like help with his overactive bladder with multiple periods of incontinence, no other concerns noted.    Associated Signs/Symptoms: Depression Symptoms:  fatigue, impaired memory, (Hypo) Manic Symptoms:  NOne Anxiety Symptoms:  Excessive Worry, Psychotic Symptoms:  None PTSD Symptoms: NA Total Time spent with patient: 1 hour  Past Psychiatric History: dementia, bipolar d/o  Is the patient at risk to self? Yes.    Has the patient been a risk to self in the past 6 months? Yes.    Has the patient been a risk to self within the distant past? Yes.    Is the patient a risk to others? No.  Has the patient been a risk to others in the past 6 months? No.  Has the patient been a risk to others within the distant past? No.   [place] Scale:  [place] Admission (Current) from 08/02/2022 in [place] ED to Hosp-Admission (Discharged) from 07/05/2022 in [place] No Risk No Risk         Prior Inpatient Therapy: Yes.   If yes, describe [facility], ECT  Prior Outpatient Therapy: Yes.   If yes, describe in place, could not remember the name  Alcohol Screening: 1. How often do you have a drink containing alcohol?: Never 2. How many drinks containing alcohol do you have on a typical day when you are drinking?: 1 or 2 3. How often do you have six or more drinks on one occasion?: Never AUDIT-C Score: 0 4. How often during the last year have you found that you were not able to stop drinking once you had started?: Never 5. How often during the last year have you failed to do what was normally expected from you because of drinking?: Never 6. How often during the last year have you needed a first drink in the morning to get yourself going after a heavy drinking session?: Never 7. How often during the last year have you had a feeling of guilt of remorse after drinking?: Never 8. How often during the last year have you been unable to remember what happened the night before because you had been drinking?: Never 9. Have you or someone else been injured as a result of your drinking?: No 10. Has a relative or friend or a doctor or another health worker been concerned about your drinking or suggested you cut down?: No Alcohol Use Disorder Identification Test Final Score (AUDIT): 0 Substance Abuse History in the last 12 months:  No. Consequences of Substance Abuse: NA Previous Psychotropic Medications: Yes  Psychological Evaluations: Yes  Past Medical History:  Past Medical History:  Diagnosis Date   Acquired hypothyroidism [id number]   Acute metabolic encephalopathy [id number]   Acute MI, true posterior wall, subsequent episode of care ([place]) 08/15/2014   EF 44% with mildly reduced LV function  Formatting of this note might be different from the original. EF 44% with mildly reduced LV function   AKI (acute kidney injury) ([place]) 09/20/2014   Anxiety    Aspiration pneumonia ([place])  07/05/2022   Atrial fibrillation ([place]) 09/28/2019   ablation in past  Formatting of this note might be different from the original. ablation in past   Bipolar disorder [facility])    w/dementia/psychotic episodes   Bradycardia 09/17/2014   Cancer ([place]) 09/28/2019   Prostate Cancer; Throat Cancer  Formatting of this note might be different from the original. Prostate Cancer; Throat Cancer   Cardiac murmur 09/28/2019   Cataract    Cervical radicular pain 07/16/2019   Cervical spondylosis 06/19/2019   Chest pain with high risk for cardiac etiology 08/19/2014   Chronic depression    Chronic pain due to trauma 07/16/2019   Coffee ground emesis 07/07/2022   Confusion 07/07/2022   Corn of toe 12/11/2018   Coronary artery calcification 07/25/2021   Coronary artery disease 08/10/2014   Degenerative disc disease, cervical 09/29/2018   Degenerative lumbar disc    Dementia without behavioral disturbance ([place]) 07/16/2019   Disease characterized by destruction of skeletal muscle 08/15/2014   Essential hypertension 09/28/2019   Essential tremor 09/21/2014   History of MI (myocardial infarction) 09/14/2014   Hypercholesteremia    Hypertension    Hypotension 09/14/2014   Long term (current) use of anticoagulants 11/14/2021   Malignant neoplasm of prostate ([place])    Malnutrition of moderate degree [id number]   Metabolic encephalopathy [id number]   Mild aortic stenosis 11/26/2019   Mood change 10/12/2019   Myocardial infarction ([place]) 08/10/2014   Nausea and vomiting 08/19/2014   Onychomycosis of left great toe 08/21/2014   Pain of fifth toe 12/11/2018   Paroxysmal atrial fibrillation ([place]) 07/25/2021   Peptic esophagitis    Peripheral neuropathy    Peripheral neuropathy due to chemotherapy ([place]) 09/21/2014   Prolonged Q-T interval on ECG 08/19/2014   Prostate cancer ([place]) 09/28/2019   Reactive depression 10/12/2019   Renal insufficiency 08/17/2014   Right bundle branch block 09/28/2019    Sepsis ([place]) 05/18/2020   Small fiber neuropathy 12/16/2018   Squamous cell carcinoma of left tonsil (HCC)    Stage 3b chronic kidney disease ([place]) 12/20/2021   Throat cancer ([place])    Thyroid disease    Transaminitis 08/17/2014   Unstable angina ([place]) 09/14/2014   Whiplash injury syndrome, sequela 08/11/2018    Past Surgical History:  Procedure Laterality Date   CARDIAC ELECTROPHYSIOLOGY STUDY AND ABLATION     COLONOSCOPY  04/07/2007   Small colonic polyp status post polypectomy. Pancolonic diverticulosis predominantly in the sigmoid colon. Internal hemorrhoids.    ESOPHAGOGASTRODUODENOSCOPY  06/15/2010   Erosive esophagitis. Status post PEG placment   EYE SURGERY     INGUINAL HERNIA REPAIR     PROSTATE BIOPSY     SEBACEOUS CYST REMOVAL     TONSILLECTOMY     Family History:  Family History  Problem Relation Age of Onset   Other Mother        brain tumor - unsure if it was cancer   Heart disease Father    Prostate cancer Father    Stomach cancer Father  Colon cancer Neg Hx    Esophageal cancer Neg Hx    Rectal cancer Neg Hx    Family Psychiatric  History: none Tobacco Screening:  Social History   Tobacco Use  Smoking Status Former   Types: Cigarettes   Quit date: 2011   Years since quitting: 13.1  Smokeless Tobacco Former    [place] [facility]     Are you interested in Tobacco Cessation Medications?  N/A, patient does not use tobacco products Counseled patient on smoking cessation:  N/A, patient does not use tobacco products Reason Tobacco Screening Not Completed: No value filed.       Social History:  Social History   Substance and Sexual Activity  Alcohol Use Not Currently     Social History   Substance and Sexual Activity  Drug Use Never    Additional Social History:      Allergies:   Allergies  Allergen Reactions   Trileptal [Oxcarbazepine] Rash   Lab Results:  Results for orders placed or performed during the hospital encounter of  08/02/22 (from the past 48 hour(s))  Glucose, capillary     Status: Abnormal   Collection Time: 08/02/22  8:17 PM  Result Value Ref Range   Glucose-Capillary 119 (H) 70 - 99 mg/dL    Comment: Glucose reference range applies only to samples taken after fasting for at least 8 hours.   Comment 1 Notify RN     Blood Alcohol level:  Lab Results  Component Value Date   ETH <10 123456    Metabolic Disorder Labs:  Lab Results  Component Value Date   HGBA1C 5.6 07/27/2022   MPG 114.02 07/27/2022   No results found for: "PROLACTIN" Lab Results  Component Value Date   CHOL 104 07/28/2022   TRIG 128 07/28/2022   HDL 38 (L) 07/28/2022   CHOLHDL 2.7 07/28/2022   VLDL 26 07/28/2022   LDLCALC 40 07/28/2022   LDLCALC 86 07/25/2021    Current Medications: Current Facility-Administered Medications  Medication Dose Route Frequency Provider Last Rate Last Admin   acetaminophen (TYLENOL) tablet 650 mg  650 mg Oral Q6H PRN Clapacs, Legend T, MD   650 mg at 08/03/22 0927   alum & mag hydroxide-simeth (MAALOX/MYLANTA) 200-200-20 MG/5ML suspension 30 mL  30 mL Oral Q4H PRN Clapacs, Avion T, MD       amiodarone (PACERONE) tablet 200 mg  200 mg Oral Daily Clapacs, Breydon T, MD   200 mg at 08/03/22 [id number]   apixaban (ELIQUIS) tablet 5 mg  5 mg Oral BID Clapacs, Madie Reno, MD   5 mg at 08/03/22 [id number]   cholecalciferol (VITAMIN D3) 25 MCG (1000 UNIT) tablet 5,000 Units  5,000 Units Oral Daily Clapacs, Madie Reno, MD   5,000 Units at 08/03/22 0927   famotidine (PEPCID) tablet 20 mg  20 mg Oral Daily Clapacs, Farid T, MD   20 mg at 08/03/22 [id number]   hydrOXYzine (ATARAX) tablet 25 mg  25 mg Oral Q6H PRN Clapacs, General T, MD   25 mg at 08/03/22 [id number]   labetalol (NORMODYNE) injection 10 mg  10 mg Intravenous Q2H PRN Clapacs, Bharat T, MD       lamoTRIgine (LAMICTAL) tablet 100 mg  100 mg Oral QHS Clapacs, Kayan T, MD   100 mg at 08/02/22 2127   levothyroxine (SYNTHROID) tablet 125 mcg  125 mcg Oral Daily Clapacs, Madie Reno, MD    125 mcg at 08/03/22 0926   lidocaine (LIDODERM) 5 % 2 patch  2 patch Transdermal Q24H Clapacs, Madie Reno, MD   2 patch at 08/03/22 1125   risperiDONE (RISPERDAL M-TABS) disintegrating tablet 2 mg  2 mg Oral Q8H PRN Clapacs, Madie Reno, MD       And   LORazepam (ATIVAN) tablet 1 mg  1 mg Oral PRN Clapacs, Madie Reno, MD       magnesium hydroxide (MILK OF MAGNESIA) suspension 30 mL  30 mL Oral Daily PRN Clapacs, Oaklan T, MD   30 mL at 08/03/22 0932   metoprolol tartrate (LOPRESSOR) tablet 50 mg  50 mg Oral BID Clapacs, Madie Reno, MD   50 mg at 08/03/22 0926   pantoprazole (PROTONIX) EC tablet 40 mg  40 mg Oral Daily Clapacs, Madie Reno, MD   40 mg at 08/03/22 [id number]   polyethylene glycol (MIRALAX / GLYCOLAX) packet 17 g  17 g Oral Daily PRN Clapacs, Madie Reno, MD   17 g at 08/03/22 [id number]   QUEtiapine (SEROQUEL XR) 24 hr tablet 200 mg  200 mg Oral QHS Clapacs, Tony T, MD   200 mg at 08/02/22 2127   rosuvastatin (CRESTOR) tablet 10 mg  10 mg Oral QPM Clapacs, Madie Reno, MD   10 mg at 08/02/22 1900   thiamine (VITAMIN B1) tablet 100 mg  100 mg Oral Daily Clapacs, Madie Reno, MD   100 mg at 08/03/22 [id number]   PTA Medications: Medications Prior to Admission  Medication Sig Dispense Refill Last Dose   amiodarone (PACERONE) 200 MG tablet Take 1 tablet (200 mg total) by mouth 2 (two) times daily for 7 days, THEN 1 tablet (200 mg total) daily. (Patient taking differently: 200 mg once daily) 44 tablet 0    apixaban (ELIQUIS) 5 MG TABS tablet Take 1 tablet (5 mg total) by mouth 2 (two) times daily. 60 tablet 0    lamoTRIgine (LAMICTAL) 100 MG tablet Take 100 mg by mouth at bedtime.      levothyroxine (SYNTHROID) 125 MCG tablet Take 125 mcg by mouth daily.      LORazepam (ATIVAN) 1 MG tablet Take 1 mg by mouth 2 (two) times daily.      metoprolol tartrate (LOPRESSOR) 50 MG tablet Take 1 tablet (50 mg total) by mouth 2 (two) times daily. 180 tablet 3    omeprazole (PRILOSEC OTC) 20 MG tablet Take 20 mg by mouth 2 (two) times daily.       polyethylene glycol (MIRALAX / GLYCOLAX) 17 g packet Take 17 g by mouth daily as needed for constipation.      QUEtiapine (SEROQUEL XR) 200 MG 24 hr tablet Take 200 mg by mouth at bedtime.      rosuvastatin (CRESTOR) 10 MG tablet Take 10 mg by mouth every evening.       Musculoskeletal: Strength & Muscle Tone: within normal limits Gait & Station: normal Patient leans: N/A  Psychiatric Specialty Exam: Physical Exam Vitals and nursing note reviewed.  Constitutional:      Appearance: Normal appearance.  HENT:     Head: Normocephalic.     Nose: Nose normal.  Pulmonary:     Effort: Pulmonary effort is normal.  Musculoskeletal:        General: Normal range of motion.     Cervical back: Normal range of motion.  Neurological:     General: No focal deficit present.     Mental Status: He is alert and oriented to person, place, and time.  Psychiatric:        Attention and Perception:  Attention and perception normal.        Mood and Affect: Mood is anxious and depressed.        Speech: Speech normal.        Behavior: Behavior normal. Behavior is cooperative.        Thought Content: Thought content normal.        Cognition and Memory: Memory is impaired.        Judgment: Judgment is impulsive.     Review of Systems  Psychiatric/Behavioral:  Positive for depression and memory loss. The patient is nervous/anxious.   All other systems reviewed and are negative.   Blood pressure 138/70, pulse (!) 57, temperature 97.7 F (36.5 C), temperature source Oral, resp. rate 18, height '5\' 7"'$  (1.702 m), weight 89 kg, SpO2 96 %.Body mass index is 30.73 kg/m.  General Appearance: Casual  Eye Contact:  Good  Speech:  Normal Rate  Volume:  Normal  Mood:  Anxious and Depressed  Affect:  Congruent  Thought Process:  Coherent  Orientation:  Full (Time, Place, and Person)  Thought Content:  Rumination  Suicidal Thoughts:  No  Homicidal Thoughts:  No  Memory:  Immediate;   Fair Recent;    Fair Remote;   Fair  Judgement:  Fair  Insight:  Fair  Psychomotor Activity:  Normal  Concentration:  Concentration: Fair and Attention Span: Fair  Recall:  [employer] of Knowledge:  Good  Language:  Good  Akathisia:  No  Handed:  Right  AIMS (if indicated):     Assets:  Leisure Time Resilience Social Support  ADL's:  Impaired  Cognition:  Impaired,  Mild  Sleep:         Physical Exam: Physical Exam Vitals and nursing note reviewed.  Constitutional:      Appearance: Normal appearance.  HENT:     Head: Normocephalic.     Nose: Nose normal.  Pulmonary:     Effort: Pulmonary effort is normal.  Musculoskeletal:        General: Normal range of motion.     Cervical back: Normal range of motion.  Neurological:     General: No focal deficit present.     Mental Status: He is alert and oriented to person, place, and time.  Psychiatric:        Attention and Perception: Attention and perception normal.        Mood and Affect: Mood is anxious and depressed.        Speech: Speech normal.        Behavior: Behavior normal. Behavior is cooperative.        Thought Content: Thought content normal.        Cognition and Memory: Memory is impaired.        Judgment: Judgment is impulsive.    Review of Systems  Psychiatric/Behavioral:  Positive for depression and memory loss. The patient is nervous/anxious.   All other systems reviewed and are negative.  Blood pressure 138/70, pulse (!) 57, temperature 97.7 F (36.5 C), temperature source Oral, resp. rate 18, height '5\' 7"'$  (1.702 m), weight 89 kg, SpO2 96 %. Body mass index is 30.73 kg/m.  Treatment Plan Summary: Daily contact with patient to assess and evaluate symptoms and progress in treatment, Medication management, and Plan : Bipolar affective disorder, depressed, severe without psychosis: Lamictal 100 mg daily Seroquel 200 mg at bedtime   Anxiety: Hydroxyzine 10 mg every six hours PRN anxiety   I certify that inpatient  services furnished  can reasonably be expected to improve the patient's condition.   Observation Level/Precautions:  15 minute checks  Laboratory:  Completed in the hospital, reviewed, stable  Psychotherapy:  individual and group therapy  Medications:  See above  Consultations:  Urology  Discharge Concerns:  rehab  Estimated LOS:  5-7 days  Other:     Physician Treatment Plan for Primary Diagnosis: Bipolar affective disorder, depressed, severe ([place]) Long Term Goal(s): Improvement in symptoms so as ready for discharge  Short Term Goals: Ability to identify changes in lifestyle to reduce recurrence of condition will improve, Ability to verbalize feelings will improve, Ability to disclose and discuss suicidal ideas, Ability to demonstrate self-control will improve, Ability to identify and develop effective coping behaviors will improve, Ability to maintain clinical measurements within normal limits will improve, Compliance with prescribed medications will improve, and Ability to identify triggers associated with substance abuse/mental health issues will improve  Physician Treatment Plan for Secondary Diagnosis: Principal Problem:   Bipolar affective disorder, depressed, severe ([place]) Active Problems:   Dementia without behavioral disturbance ([place])  Long Term Goal(s): Improvement in symptoms so as ready for discharge  Short Term Goals: Ability to identify changes in lifestyle to reduce recurrence of condition will improve, Ability to verbalize feelings will improve, Ability to disclose and discuss suicidal ideas, Ability to demonstrate self-control will improve, Ability to identify and develop effective coping behaviors will improve, Ability to maintain clinical measurements within normal limits will improve, Compliance with prescribed medications will improve, and Ability to identify triggers associated with substance abuse/mental health issues will improve  I certify that inpatient services  furnished can reasonably be expected to improve the patient's condition.    Waylan Boga, NP 2/23/20243:20 PM

## 2022-08-03 NOTE — Group Note (Signed)
Recreation Therapy Group Note   Group Topic:Leisure Education  Group Date: 08/03/2022 Start Time: 1400 End Time: 1445 Facilitators: Vilma Prader, LRT, CTRS Location:  Dayroom  Group Description: Seated Exercise. Patients were given the choice to do exercise or listen to their favorite music songs. Pts chose to complete seated exercise. LRT and patients discussed the importance of having leisure interests post discharge, and how exercise can be one of them.   Affect/Mood: Appropriate   Participation Level: Active and Engaged   Participation Quality: Independent   Behavior: Appropriate and Calm   Speech/Thought Process: Coherent   Insight: Good   Judgement: Moderate   Modes of Intervention: Activity and Education   Patient Response to Interventions:  Engaged and Interested    Education Outcome:  Acknowledges education   Clinical Observations/Individualized Feedback: [place] was active in their participation of session activities and group discussion. Pt identified that he enjoyed the exercise session and gave LRT a "thumbs up". He was open to the idea of exercising at home post discharge. Pt requested water after exercise session and LRT provided it for him.    Plan: Continue to engage patient in RT group sessions 2-3x/week.   Vilma Prader, LRT, [place] 08/03/2022 2:47 PM

## 2022-08-03 NOTE — Progress Notes (Signed)
   08/03/22 2100  Psych Admission Type (Psych Patients Only)  Admission Status Involuntary  Psychosocial Assessment  Patient Complaints Anxiety;Depression  Eye Contact Brief  Facial Expression Anxious  Affect Anxious  Speech Logical/coherent  Interaction Assertive  Motor Activity Slow  Appearance/Hygiene Unremarkable;In scrubs  Behavior Characteristics Anxious  Mood Anxious;Pleasant  Thought Process  Coherency WDL  Content WDL  Delusions None reported or observed  Perception WDL  Hallucination None reported or observed  Judgment Impaired  Confusion Mild  Danger to Self  Current suicidal ideation? Denies  Danger to Others  Danger to Others None reported or observed   Progress note   D: Pt seen in his room. Pt denies SI, HI, AVH. Pt rates pain  7-8/10. Pt endorses anxiety and depression. Pt's wife came to visit today. Pt said visit was good. Pt wants to leave and go home. Says he is disheartened. Doesn't remember if he spoke with a provider today or not. Still endorsing pain in his legs since he first came to hospital for his stroke. No other concerns noted at this time.  A: Pt provided support and encouragement. Pt given scheduled medication as prescribed. Takes medications whole with no problem. PRNs as appropriate. Q15 min checks for safety.   R: Pt safe on the unit. Will continue to monitor.

## 2022-08-03 NOTE — Plan of Care (Signed)
Pt endorses anxiety however, denies depression at this time. Pt denies SI/HI/AVH or pain at this time. Pt is calm and cooperative. Pt is medication compliant. Pt provided with support and encouragement. Pt monitored q15 minutes for safety per unit policy. Plan of care ongoing.   Pt has c/o constipation stating he hasn't gone in 3 day. Milk of Mg was offered and given. Pt was satisfied with this intervention and will report to staff when he has a bm. Pt is mildly confused and can be labile at times.  Problem: Nutrition: Goal: Adequate nutrition will be maintained Outcome: Progressing   Problem: Coping: Goal: Level of anxiety will decrease Outcome: Not Progressing

## 2022-08-03 NOTE — Group Note (Signed)
LCSW Group Therapy Note  Group Date: 08/03/2022 Start Time: 1300 End Time: 1350   Type of Therapy and Topic:  Group Therapy - Healthy vs Unhealthy Coping Skills  Participation Level:  Active   Description of Group The focus of this group was to determine what unhealthy coping techniques typically are used by group members and what healthy coping techniques would be helpful in coping with various problems. Patients were guided in becoming aware of the differences between healthy and unhealthy coping techniques. Patients were asked to identify 2-3 healthy coping skills they would like to learn to use more effectively.  Therapeutic Goals Patients learned that coping is what human beings do all day long to deal with various situations in their lives Patients defined and discussed healthy vs unhealthy coping techniques Patients identified their preferred coping techniques and identified whether these were healthy or unhealthy Patients determined 2-3 healthy coping skills they would like to become more familiar with and use more often. Patients provided support and ideas to each other   Summary of Patient Progress:  During group, he expressed that his spouse is his primary support and that she helps take care of him. He stated that he enjoys "watching ball games" as a coping skill that helps him stay calm and seeing his extended family is also a positive for him. Patient proved open to input from peers and feedback from [name]. Patient demonstrated fair insight into the subject matter, was respectful of peers, and participated throughout the entire session.   Therapeutic Modalities Cognitive Behavioral Therapy Motivational Interviewing  [name], [name] 08/03/2022  2:01 PM

## 2022-08-03 NOTE — BH Assessment (Signed)
Recreation Therapy Notes  INPATIENT RECREATION THERAPY ASSESSMENT  Patient Details Name: [name] MRN: [id number] DOB: 09-Jun-1941 Today's Date: 08/03/2022       Information Obtained From: Patient (In addition to chart review)  Able to Participate in Assessment/Interview: Yes (LRT did not ask pt all the questions as pt started to become guarded and did not respond.)  Patient Presentation: Confused, Resistant (Sarcastic, Snippy)  Reason for Admission (Per Patient): Patient Unable to Identify (Per pt, "I was in the hospital for 4 days and then my wife brought me here". Per chart review, pt is aggressive and impulsive.)  Patient Stressors:  ("Are you kidding me?! Nothing!")  Coping Skills:    (Unable to identify. Pt laughed and said "okay next" when LRT asked.)  Leisure Interests (2+):   (Unable to identify. Pt laughed and said "okay next" when LRT asked.)  Frequency of Recreation/Participation:  (Unable to identify.)  Awareness of Community Resources:   (Unable to identify.)  Community Resources:   Unable to identify.   Current Use:  Unable to identify.   If no, Barriers?:  Unable to identify.   Expressed Interest in [employer] Information:  (Unable to identify.)  [place] of Residence:   ("Why are you asking me this? Why is this a question on here? Well what county do you live in? LRT answered all of pts questions and pt said "well we are right next door in [place]".)  Patient Main Form of Transportation:  (Unable to identify.)  Patient Strengths:  Unable to identify.  Patient Identified Areas of Improvement:  "I did not know I needed to work on things in here. I have been retired since I was 82 years old".  Patient Goal for Hospitalization:  Unable to identify.  Current SI (including self-harm):   (Unable to identify.)  Current HI:   (Unable to identify.)  Current AVH:  (Unable to identify.)  Staff Intervention  Plan: Group Attendance, Collaborate with Interdisciplinary Treatment Team  Consent to Intern Participation: N/A   Juline Patch LRT, CTRS  Djimon Lundstrom Scarlette Ar 08/03/2022, 12:56 PM

## 2022-08-04 DIAGNOSIS — F314 Bipolar disorder, current episode depressed, severe, without psychotic features: Secondary | ICD-10-CM | POA: Diagnosis not present

## 2022-08-04 MED ORDER — OXYBUTYNIN CHLORIDE 5 MG PO TABS
2.5000 mg | ORAL_TABLET | Freq: Three times a day (TID) | ORAL | Status: DC
  Administered 2022-08-04 – 2022-08-10 (×18): 2.5 mg via ORAL
  Filled 2022-08-04 (×19): qty 0.5

## 2022-08-04 NOTE — Group Note (Signed)
LCSW Group Therapy Note   Group Date: 08/04/2022 Start Time: 1330 End Time: 1430   Type of Therapy and Topic:  Group Therapy: Challenging Core Beliefs  Participation Level:  Active  Description of Group:  Patients were educated about core beliefs and asked to identify one harmful core belief that they have. Patients were asked to explore from where those beliefs originate. Patients were asked to discuss how those beliefs make them feel and the resulting behaviors of those beliefs. They were then be asked if those beliefs are true and, if so, what evidence they have to support them. Lastly, group members were challenged to replace those negative core beliefs with helpful beliefs.   Therapeutic Goals:   1. Patient will identify harmful core beliefs and explore the origins of such beliefs. 2. Patient will identify feelings and behaviors that result from those core beliefs. 3. Patient will discuss whether such beliefs are true. 4.  Patient will replace harmful core beliefs with helpful ones.  Summary of Patient Progress:  [name] actively engaged in processing and exploring how core beliefs are formed and how they impact thoughts, feelings, and behaviors. Patient proved open to input from peers and feedback from [place]. Patient demonstrated developing insight into the subject matter, was respectful and supportive of peers, and participated throughout the entire session.  Therapeutic Modalities: Cognitive Behavioral Therapy; Solution-Focused Therapy   Vassie Moselle, LCSW 08/04/2022  2:28 PM

## 2022-08-04 NOTE — Progress Notes (Signed)
[facility] MD Progress Note  08/04/2022 1:18 PM [name]  MRN:  [id number] Subjective: [name] is seen on rounds.  His biggest complaint is not sleeping through the night because he has bladder incontinence.  He has never been on Ditropan, so I spoke to him about it.  He is currently not on an antidepressant but he is on antipsychotics.  He seems to be tolerating the medications well.  He came in with dementia and depression.  He currently lives in [place] with his wife. Principal Problem: Bipolar affective disorder, depressed, severe ([place]) Diagnosis: Principal Problem:   Bipolar affective disorder, depressed, severe ([place]) Active Problems:   Dementia without behavioral disturbance ([place])  Total Time spent with patient: 15 minutes  Past Psychiatric History: History of bipolar disorder  Past Medical History:  Past Medical History:  Diagnosis Date   Acquired hypothyroidism [id number]   Acute metabolic encephalopathy [id number]   Acute MI, true posterior wall, subsequent episode of care ([place]) 08/15/2014   EF 44% with mildly reduced LV function  Formatting of this note might be different from the original. EF 44% with mildly reduced LV function   AKI (acute kidney injury) ([place]) 09/20/2014   Anxiety    Aspiration pneumonia ([place]) 07/05/2022   Atrial fibrillation ([place]) 09/28/2019   ablation in past  Formatting of this note might be different from the original. ablation in past   Bipolar disorder [facility])    w/dementia/psychotic episodes   Bradycardia 09/17/2014   Cancer ([place]) 09/28/2019   Prostate Cancer; Throat Cancer  Formatting of this note might be different from the original. Prostate Cancer; Throat Cancer   Cardiac murmur 09/28/2019   Cataract    Cervical radicular pain 07/16/2019   Cervical spondylosis 06/19/2019   Chest pain with high risk for cardiac etiology 08/19/2014   Chronic depression    Chronic pain due to trauma 07/16/2019   Coffee ground emesis 07/07/2022   Confusion  07/07/2022   Corn of toe 12/11/2018   Coronary artery calcification 07/25/2021   Coronary artery disease 08/10/2014   Degenerative disc disease, cervical 09/29/2018   Degenerative lumbar disc    Dementia without behavioral disturbance ([place]) 07/16/2019   Disease characterized by destruction of skeletal muscle 08/15/2014   Essential hypertension 09/28/2019   Essential tremor 09/21/2014   History of MI (myocardial infarction) 09/14/2014   Hypercholesteremia    Hypertension    Hypotension 09/14/2014   Long term (current) use of anticoagulants 11/14/2021   Malignant neoplasm of prostate ([place])    Malnutrition of moderate degree [id number]   Metabolic encephalopathy [id number]   Mild aortic stenosis 11/26/2019   Mood change 10/12/2019   Myocardial infarction ([place]) 08/10/2014   Nausea and vomiting 08/19/2014   Onychomycosis of left great toe 08/21/2014   Pain of fifth toe 12/11/2018   Paroxysmal atrial fibrillation ([place]) 07/25/2021   Peptic esophagitis    Peripheral neuropathy    Peripheral neuropathy due to chemotherapy ([place]) 09/21/2014   Prolonged Q-T interval on ECG 08/19/2014   Prostate cancer ([place]) 09/28/2019   Reactive depression 10/12/2019   Renal insufficiency 08/17/2014   Right bundle branch block 09/28/2019   Sepsis ([place]) 05/18/2020   Small fiber neuropathy 12/16/2018   Squamous cell carcinoma of left tonsil (HCC)    Stage 3b chronic kidney disease ([place]) 12/20/2021   Throat cancer ([place])    Thyroid disease    Transaminitis 08/17/2014   Unstable angina ([place]) 09/14/2014   Whiplash injury syndrome, sequela 08/11/2018    Past Surgical  History:  Procedure Laterality Date   CARDIAC ELECTROPHYSIOLOGY STUDY AND ABLATION     COLONOSCOPY  04/07/2007   Small colonic polyp status post polypectomy. Pancolonic diverticulosis predominantly in the sigmoid colon. Internal hemorrhoids.    ESOPHAGOGASTRODUODENOSCOPY  06/15/2010   Erosive esophagitis. Status post PEG placment   EYE  SURGERY     INGUINAL HERNIA REPAIR     PROSTATE BIOPSY     SEBACEOUS CYST REMOVAL     TONSILLECTOMY     Family History:  Family History  Problem Relation Age of Onset   Other Mother        brain tumor - unsure if it was cancer   Heart disease Father    Prostate cancer Father    Stomach cancer Father    Colon cancer Neg Hx    Esophageal cancer Neg Hx    Rectal cancer Neg Hx    Family Psychiatric  History: Unremarkable Social History:  Social History   Substance and Sexual Activity  Alcohol Use Not Currently     Social History   Substance and Sexual Activity  Drug Use Never    Social History   Socioeconomic History   Marital status: Married    Spouse name: Not on file   Number of children: 3   Years of education: 10th grade   Highest education level: Not on file  Occupational History   Occupation: Retired  Tobacco Use   Smoking status: Former    Types: Cigarettes    Quit date: 2011    Years since quitting: 13.1   Smokeless tobacco: Former  Scientific laboratory technician Use: Never used  Substance and Sexual Activity   Alcohol use: Not Currently   Drug use: Never   Sexual activity: Not Currently  Other Topics Concern   Not on file  Social History Narrative   Lives at home with wife.   Right-handed.   2 cups of caffeine per day.   Social Determinants of Health   Financial Resource Strain: Not on file  Food Insecurity: No Food Insecurity (08/02/2022)   Hunger Vital Sign    Worried About Running Out of Food in the Last Year: Never true    Ran Out of Food in the Last Year: Never true  Transportation Needs: No Transportation Needs (08/02/2022)   PRAPARE - Hydrologist (Medical): No    Lack of Transportation (Non-Medical): No  Physical Activity: Not on file  Stress: Not on file  Social Connections: Not on file   Additional Social History:                         Sleep: Fair  Appetite:  Fair  Current Medications: Current  Facility-Administered Medications  Medication Dose Route Frequency Provider Last Rate Last Admin   acetaminophen (TYLENOL) tablet 650 mg  650 mg Oral Q6H PRN Clapacs, Kiren T, MD   650 mg at 08/04/22 0940   alum & mag hydroxide-simeth (MAALOX/MYLANTA) 200-200-20 MG/5ML suspension 30 mL  30 mL Oral Q4H PRN Clapacs, Madie Reno, MD       amiodarone (PACERONE) tablet 200 mg  200 mg Oral Daily Clapacs, Tilmon T, MD   200 mg at 08/04/22 0940   apixaban (ELIQUIS) tablet 5 mg  5 mg Oral BID Clapacs, Madie Reno, MD   5 mg at 08/04/22 0940   cholecalciferol (VITAMIN D3) 25 MCG (1000 UNIT) tablet 5,000 Units  5,000 Units Oral Daily Clapacs,  Madie Reno, MD   5,000 Units at 08/04/22 0940   famotidine (PEPCID) tablet 20 mg  20 mg Oral Daily Clapacs, Madie Reno, MD   20 mg at 08/04/22 0940   hydrOXYzine (ATARAX) tablet 25 mg  25 mg Oral Q6H PRN Clapacs, Machi T, MD   25 mg at 08/04/22 [id number]   labetalol (NORMODYNE) injection 10 mg  10 mg Intravenous Q2H PRN Clapacs, Madie Reno, MD       lamoTRIgine (LAMICTAL) tablet 100 mg  100 mg Oral QHS Clapacs, Adley T, MD   100 mg at 08/03/22 2110   levothyroxine (SYNTHROID) tablet 125 mcg  125 mcg Oral Daily Clapacs, Collier T, MD   125 mcg at 08/04/22 0939   lidocaine (LIDODERM) 5 % 2 patch  2 patch Transdermal Q24H Clapacs, Madie Reno, MD   2 patch at 08/04/22 1225   risperiDONE (RISPERDAL M-TABS) disintegrating tablet 2 mg  2 mg Oral Q8H PRN Clapacs, Madie Reno, MD       And   LORazepam (ATIVAN) tablet 1 mg  1 mg Oral PRN Clapacs, Fallou T, MD       magnesium hydroxide (MILK OF MAGNESIA) suspension 30 mL  30 mL Oral Daily PRN Clapacs, Elder T, MD   30 mL at 08/04/22 0941   metoprolol tartrate (LOPRESSOR) tablet 50 mg  50 mg Oral BID Clapacs, Alper T, MD   50 mg at 08/03/22 2109   oxybutynin (DITROPAN) tablet 2.5 mg  2.5 mg Oral TID Parks Ranger, DO       pantoprazole (PROTONIX) EC tablet 40 mg  40 mg Oral Daily Clapacs, Azaan T, MD   40 mg at 08/04/22 [id number]   polyethylene glycol (MIRALAX / GLYCOLAX)  packet 17 g  17 g Oral Daily PRN Clapacs, Levar T, MD   17 g at 08/03/22 [id number]   QUEtiapine (SEROQUEL XR) 24 hr tablet 200 mg  200 mg Oral QHS Clapacs, Manly T, MD   200 mg at 08/03/22 2109   rosuvastatin (CRESTOR) tablet 10 mg  10 mg Oral QPM Clapacs, Markee T, MD   10 mg at 08/03/22 1705   thiamine (VITAMIN B1) tablet 100 mg  100 mg Oral Daily Clapacs, Madie Reno, MD   100 mg at 08/04/22 [id number]    Lab Results:  Results for orders placed or performed during the hospital encounter of 08/02/22 (from the past 48 hour(s))  Glucose, capillary     Status: Abnormal   Collection Time: 08/02/22  8:17 PM  Result Value Ref Range   Glucose-Capillary 119 (H) 70 - 99 mg/dL    Comment: Glucose reference range applies only to samples taken after fasting for at least 8 hours.   Comment 1 Notify RN     Blood Alcohol level:  Lab Results  Component Value Date   ETH <10 123456    Metabolic Disorder Labs: Lab Results  Component Value Date   HGBA1C 5.6 07/27/2022   MPG 114.02 07/27/2022   No results found for: "PROLACTIN" Lab Results  Component Value Date   CHOL 104 07/28/2022   TRIG 128 07/28/2022   HDL 38 (L) 07/28/2022   CHOLHDL 2.7 07/28/2022   VLDL 26 07/28/2022   LDLCALC 40 07/28/2022   LDLCALC 86 07/25/2021    Physical Findings: AIMS:  , ,  ,  ,    CIWA:    COWS:     Musculoskeletal: Strength & Muscle Tone: within normal limits Gait & Station: normal Patient leans: N/A  Psychiatric Specialty Exam:  Presentation  General Appearance:  Appropriate for Environment; Casual  Eye Contact: Good  Speech: Clear and Coherent; Normal Rate  Speech Volume: Normal  Handedness:No data recorded  Mood and Affect  Mood: Euthymic  Affect: Appropriate; Congruent   Thought Process  Thought Processes: Coherent  Descriptions of Associations:Intact  Orientation:Full (Time, Place and Person)  Thought Content:WDL  History of Schizophrenia/Schizoaffective disorder:No data  recorded Duration of Psychotic Symptoms:No data recorded Hallucinations:No data recorded Ideas of Reference:None  Suicidal Thoughts:No data recorded Homicidal Thoughts:No data recorded  Sensorium  Memory: Immediate Fair; Recent Fair  Judgment: Fair  Insight: Fair; Shallow   Executive Functions  Concentration: Good  Attention Span: Good  Recall: [place] of Knowledge: Good  Language: Good   Psychomotor Activity  Psychomotor Activity:No data recorded  Assets  Assets: Desire for Improvement; Housing; Social Support; Talents/Skills   Sleep  Sleep:No data recorded    Blood pressure 111/60, pulse 63, temperature (!) 97.5 F (36.4 C), temperature source Oral, resp. rate 18, height '5\' 7"'$  (1.702 m), weight 89 kg, SpO2 99 %. Body mass index is 30.73 kg/m.   Treatment Plan Summary: Daily contact with patient to assess and evaluate symptoms and progress in treatment, Medication management, and Plan continue current medications.  Start Ditropan 2.5 mg 3 times a day.  Parks Ranger, DO 08/04/2022, 1:18 PM

## 2022-08-04 NOTE — Plan of Care (Signed)
Pt denies anxiety/depression at this time. Pt denies SI/HI/AVH or pain at this time. Pt is calm and cooperative. Pt is medication compliant. Pt provided with support and encouragement. Pt monitored q15 minutes for safety per unit policy. Plan of care ongoing.   Problem: Nutrition: Goal: Adequate nutrition will be maintained Outcome: Progressing   Problem: Education: Goal: Knowledge of General Education information will improve Description: Including pain rating scale, medication(s)/side effects and non-pharmacologic comfort measures Outcome: Not Progressing

## 2022-08-04 NOTE — Group Note (Signed)
Date:  08/04/2022 Time:  6:01 PM  Group Topic/Focus:  Overcoming Stress:   The focus of this group is to define stress and help patients assess their triggers. Self Care:   The focus of this group is to help patients understand the importance of self-care in order to improve or restore emotional, physical, spiritual, interpersonal, and financial health.    Participation Level:  Active  Participation Quality:  Appropriate, Attentive, and Sharing  Affect:  Appropriate  Cognitive:  Alert and Appropriate  Insight: Appropriate and Good  Engagement in Group:  Engaged  Modes of Intervention:  Discussion  Additional Comments:    Ladona Mow 08/04/2022, 6:01 PM

## 2022-08-04 NOTE — BHH Counselor (Signed)
Adult Comprehensive Assessment  Patient ID: [name], male   DOB: 09-Jan-1941, 82 y.o.   MRN: [id number]  Information Source: Information source: Patient  Current Stressors:  Patient states their primary concerns and needs for treatment are:: Pt shares he has had a lot of depression throughout his life. He also has recent hx of CVA. Pt shares that due to stroke he is not having thoughts he has never expierenced before and reports it changing him Patient states their goals for this hospitilization and ongoing recovery are:: Pt shares he hopes to gian help with social skills and getting along with others Educational / Learning stressors: Not in school. Denies current stressor. 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 Denies stressor Family Relationships: Denies Human resources officer / Lack of resources (include bankruptcy): Denies stressor, states he has always saved for the future Housing / Lack of housing: Denies stressor Physical health (include injuries & life threatening diseases): hx of dementia, CVA, HTN Social relationships: Denies stressor Substance abuse: Denies stressor. States he has never used drugs. Has used alcohol that he shares he is ashamed of. Last had one drink over a year ago. Bereavement / Loss: Denies stressor however, pt does become tearful when remembering his grandfather and his impact on his life  Living/Environment/Situation:  Living Arrangements: Spouse/significant other Living conditions (as described by patient or guardian): Lives in single family home Who else lives in the home?: Spouse How long has patient lived in current situation?: 14 years What is atmosphere in current home: Comfortable, Supportive  Family History:  Marital status: Married What types of issues is patient dealing with in the relationship?: Patient shares that his wife likes her things and he likes his things. States their relationship is fairly ok but, could be  better Additional relationship information: Pt shares that his wife takes care of him. She buys his clothes for him and know exactly what to get for him. Are you sexually active?:  (Did not assess) What is your sexual orientation?: Did not assess Has your sexual activity been affected by drugs, alcohol, medication, or emotional stress?: Did not assess Does patient have children?: Yes How many children?: 3 How is patient's relationship with their children?: Pt has 2 children from his first marriage that he does not see. Pt has daughter with 2nd wife who he has a better relationship. Pt also has granddaughter who is in college who he is proud of.  Childhood History:  By whom was/is the patient raised?: Grandparents Additional childhood history information: Pt was raised by his grandmother and grandfather Description of patient's relationship with caregiver when they were a child: Pt shares his grandparents were great. His grandfather taught him how to garden. his grandfather would give pt the coat on his own back if he were cold, made sure each child had money each day, and would help others in need. Pt had a great relationship with his grandfather Patient's description of current relationship with people who raised him/her: Both have passed away How were you disciplined when you got in trouble as a child/adolescent?: He shares that his grandfather would take him to the porch and have a conversation with him and scold him when he did somthing wrong Does patient have siblings?: Yes Number of Siblings: 4 Description of patient's current relationship with siblings: Pt has 2 brothers and 1 half brother and 1 half sister. One of pt's brothers have passed away. He has a good relationship with his other brother and his  half siblings Did patient suffer any verbal/emotional/physical/sexual abuse as a child?:  (Deferred) Did patient suffer from severe childhood neglect?:  (Deferred) Was the patient ever a  victim of a crime or a disaster?: No Witnessed domestic violence?: No Has patient been affected by domestic violence as an adult?: No  Education:  Highest grade of school patient has completed: 10th grade Currently a student?: No Learning disability?: Yes What learning problems does patient have?: Struggled with Geophysical data processor  Employment/Work Situation:   Employment Situation: Retired Social research officer, government has Been Impacted by Current Illness: No What is the Longest Time Patient has Held a Job?: 9 years 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 Has Patient ever Been in the [employer]?: No  Financial Resources:   Museum/gallery curator resources: [employer] Water engineer) Does patient have a Programmer, applications or guardian?: No  Alcohol/Substance Abuse:   What has been your use of drugs/alcohol within the last 12 months?: Denies all use If attempted suicide, did drugs/alcohol play a role in this?: No Alcohol/Substance Abuse Treatment Hx: Denies past history Has alcohol/substance abuse ever caused legal problems?: No  Social Support System:   Pensions consultant Support System: Good Describe Community Support System: Brothers, half-siblings, wife, brother in law Type of faith/religion: "Faithful, but not quite enough" How does patient's faith help to cope with current illness?: Pt shares with getting older he has been putting more effort into it  Leisure/Recreation:   Do You Have Hobbies?: Yes Leisure and Hobbies: woodworking and gardening  Strengths/Needs:   What is the patient's perception of their strengths?: "I stand my gound" Patient states they can use these personal strengths during their treatment to contribute to their recovery: Yes, is able ot set boundaries Patient states these barriers may affect/interfere with their treatment: n/a Patient states these barriers may affect their return to the community: n/a Other important information patient would like  considered in planning for their treatment: n/a  Discharge Plan:   Currently receiving community mental health services: No Patient states concerns and preferences for aftercare planning are: Pt shares he has a family doctor that he sees and would like to continue to see family doctor for services Patient states they will know when they are safe and ready for discharge when: Unkown Does patient have access to transportation?: Yes Does patient have financial barriers related to discharge medications?: No Patient description of barriers related to discharge medications: N/A Will patient be returning to same living situation after discharge?: Yes  Summary/Recommendations:   Summary and Recommendations (to be completed by the evaluator): [name] was admitted due to concerns around pt's behaviors and current mental health. Per chart pt had agreed for ECT treatment which, he has had in past however, has decided since arrival he does not want ECT.  Pt has a hx of bipolar disorder, chronic depression, and anxiety. Recent stressors include recent stroke and change in thoughts/thinking. Pt currently sees family doctor but, does not have psychiatrist or therapist. While here, [name] can benefit from crisis stabilization, medication management, therapeutic milieu, and referrals for services.  Icker Swigert A Charlis Harner. 08/04/2022

## 2022-08-05 DIAGNOSIS — F314 Bipolar disorder, current episode depressed, severe, without psychotic features: Secondary | ICD-10-CM | POA: Diagnosis not present

## 2022-08-05 MED ORDER — LORAZEPAM 0.5 MG PO TABS
0.5000 mg | ORAL_TABLET | Freq: Four times a day (QID) | ORAL | Status: DC | PRN
  Administered 2022-08-07 – 2022-08-09 (×3): 0.5 mg via ORAL
  Filled 2022-08-05 (×3): qty 1

## 2022-08-05 NOTE — Progress Notes (Signed)
Patient was calm and cooperative this shift.  Pt was compliant with medications, denied SI HI AVH, and denied physical distress.  Supportive communication and continued monitoring for safety were provided.    08/04/22 2300  Psych Admission Type (Psych Patients Only)  Admission Status Involuntary  Psychosocial Assessment  Patient Complaints None  Eye Contact Fair  Facial Expression Anxious  Affect Anxious  Speech Logical/coherent  Interaction Assertive  Motor Activity Slow  Appearance/Hygiene Unremarkable  Behavior Characteristics Cooperative  Mood Pleasant;Anxious  Thought Process  Coherency WDL  Content WDL  Delusions None reported or observed  Perception WDL  Hallucination None reported or observed  Judgment Impaired  Confusion Mild  Danger to Self  Current suicidal ideation? Denies  Self-Injurious Behavior No self-injurious ideation or behavior indicators observed or expressed   Agreement Not to Harm Self Yes  Description of Agreement verbal  Danger to Others  Danger to Others None reported or observed

## 2022-08-05 NOTE — Progress Notes (Signed)
[facility] MD Progress Note  08/05/2022 1:17 PM [name]  MRN:  [id number] Subjective: [name] is very happy today.  He says that he has stayed more dry than he has in the last 10 years.  He has never been on Ditropan and it seems to be working and he is very happy with this. Principal Problem: Bipolar affective disorder, depressed, severe ([place]) Diagnosis: Principal Problem:   Bipolar affective disorder, depressed, severe ([place]) Active Problems:   Dementia without behavioral disturbance ([place])  Total Time spent with patient: 15 minutes  Past Psychiatric History: Bipolar disorder  Past Medical History:  Past Medical History:  Diagnosis Date   Acquired hypothyroidism [id number]   Acute metabolic encephalopathy [id number]   Acute MI, true posterior wall, subsequent episode of care ([place]) 08/15/2014   EF 44% with mildly reduced LV function  Formatting of this note might be different from the original. EF 44% with mildly reduced LV function   AKI (acute kidney injury) ([place]) 09/20/2014   Anxiety    Aspiration pneumonia ([employer]) 07/05/2022   Atrial fibrillation ([place]) 09/28/2019   ablation in past  Formatting of this note might be different from the original. ablation in past   Bipolar disorder [facility])    w/dementia/psychotic episodes   Bradycardia 09/17/2014   Cancer ([place]) 09/28/2019   Prostate Cancer; Throat Cancer  Formatting of this note might be different from the original. Prostate Cancer; Throat Cancer   Cardiac murmur 09/28/2019   Cataract    Cervical radicular pain 07/16/2019   Cervical spondylosis 06/19/2019   Chest pain with high risk for cardiac etiology 08/19/2014   Chronic depression    Chronic pain due to trauma 07/16/2019   Coffee ground emesis 07/07/2022   Confusion 07/07/2022   Corn of toe 12/11/2018   Coronary artery calcification 07/25/2021   Coronary artery disease 08/10/2014   Degenerative disc disease, cervical 09/29/2018   Degenerative lumbar disc    Dementia  without behavioral disturbance ([place]) 07/16/2019   Disease characterized by destruction of skeletal muscle 08/15/2014   Essential hypertension 09/28/2019   Essential tremor 09/21/2014   History of MI (myocardial infarction) 09/14/2014   Hypercholesteremia    Hypertension    Hypotension 09/14/2014   Long term (current) use of anticoagulants 11/14/2021   Malignant neoplasm of prostate ([place])    Malnutrition of moderate degree [id number]   Metabolic encephalopathy [id number]   Mild aortic stenosis 11/26/2019   Mood change 10/12/2019   Myocardial infarction ([place]) 08/10/2014   Nausea and vomiting 08/19/2014   Onychomycosis of left great toe 08/21/2014   Pain of fifth toe 12/11/2018   Paroxysmal atrial fibrillation ([place]) 07/25/2021   Peptic esophagitis    Peripheral neuropathy    Peripheral neuropathy due to chemotherapy ([place]) 09/21/2014   Prolonged Q-T interval on ECG 08/19/2014   Prostate cancer ([place]) 09/28/2019   Reactive depression 10/12/2019   Renal insufficiency 08/17/2014   Right bundle branch block 09/28/2019   Sepsis ([place]) 05/18/2020   Small fiber neuropathy 12/16/2018   Squamous cell carcinoma of left tonsil (HCC)    Stage 3b chronic kidney disease ([place]) 12/20/2021   Throat cancer ([place])    Thyroid disease    Transaminitis 08/17/2014   Unstable angina ([place]) 09/14/2014   Whiplash injury syndrome, sequela 08/11/2018    Past Surgical History:  Procedure Laterality Date   CARDIAC ELECTROPHYSIOLOGY STUDY AND ABLATION     COLONOSCOPY  04/07/2007   Small colonic polyp status post polypectomy. Pancolonic diverticulosis predominantly in the sigmoid  colon. Internal hemorrhoids.    ESOPHAGOGASTRODUODENOSCOPY  06/15/2010   Erosive esophagitis. Status post PEG placment   EYE SURGERY     INGUINAL HERNIA REPAIR     PROSTATE BIOPSY     SEBACEOUS CYST REMOVAL     TONSILLECTOMY     Family History:  Family History  Problem Relation Age of Onset   Other Mother        brain tumor -  unsure if it was cancer   Heart disease Father    Prostate cancer Father    Stomach cancer Father    Colon cancer Neg Hx    Esophageal cancer Neg Hx    Rectal cancer Neg Hx    Family Psychiatric  History: Unremarkable Social History:  Social History   Substance and Sexual Activity  Alcohol Use Not Currently     Social History   Substance and Sexual Activity  Drug Use Never    Social History   Socioeconomic History   Marital status: Married    Spouse name: Not on file   Number of children: 3   Years of education: 10th grade   Highest education level: Not on file  Occupational History   Occupation: Retired  Tobacco Use   Smoking status: Former    Types: Cigarettes    Quit date: 2011    Years since quitting: 13.1   Smokeless tobacco: Former  Scientific laboratory technician Use: Never used  Substance and Sexual Activity   Alcohol use: Not Currently   Drug use: Never   Sexual activity: Not Currently  Other Topics Concern   Not on file  Social History Narrative   Lives at home with wife.   Right-handed.   2 cups of caffeine per day.   Social Determinants of Health   Financial Resource Strain: Not on file  Food Insecurity: No Food Insecurity (08/02/2022)   Hunger Vital Sign    Worried About Running Out of Food in the Last Year: Never true    Ran Out of Food in the Last Year: Never true  Transportation Needs: No Transportation Needs (08/02/2022)   PRAPARE - Hydrologist (Medical): No    Lack of Transportation (Non-Medical): No  Physical Activity: Not on file  Stress: Not on file  Social Connections: Not on file   Additional Social History:                         Sleep: Good  Appetite:  Good  Current Medications: Current Facility-Administered Medications  Medication Dose Route Frequency Provider Last Rate Last Admin   acetaminophen (TYLENOL) tablet 650 mg  650 mg Oral Q6H PRN Clapacs, Leotis T, MD   650 mg at 08/05/22 0912    alum & mag hydroxide-simeth (MAALOX/MYLANTA) 200-200-20 MG/5ML suspension 30 mL  30 mL Oral Q4H PRN Clapacs, Madie Reno, MD       amiodarone (PACERONE) tablet 200 mg  200 mg Oral Daily Clapacs, Branch T, MD   200 mg at 08/05/22 0912   apixaban (ELIQUIS) tablet 5 mg  5 mg Oral BID Clapacs, Madie Reno, MD   5 mg at 08/05/22 0913   cholecalciferol (VITAMIN D3) 25 MCG (1000 UNIT) tablet 5,000 Units  5,000 Units Oral Daily Clapacs, Madie Reno, MD   5,000 Units at 08/05/22 0912   famotidine (PEPCID) tablet 20 mg  20 mg Oral Daily Clapacs, Madie Reno, MD   20 mg at 08/05/22 [id number]  hydrOXYzine (ATARAX) tablet 25 mg  25 mg Oral Q6H PRN Clapacs, Oliverio T, MD   25 mg at 08/05/22 0912   labetalol (NORMODYNE) injection 10 mg  10 mg Intravenous Q2H PRN Clapacs, Madie Reno, MD       lamoTRIgine (LAMICTAL) tablet 100 mg  100 mg Oral QHS Clapacs, Agustus T, MD   100 mg at 08/04/22 2122   levothyroxine (SYNTHROID) tablet 125 mcg  125 mcg Oral Daily Clapacs, Madie Reno, MD   125 mcg at 08/05/22 0647   lidocaine (LIDODERM) 5 % 2 patch  2 patch Transdermal Q24H Clapacs, Madie Reno, MD   2 patch at 08/05/22 1140   risperiDONE (RISPERDAL M-TABS) disintegrating tablet 2 mg  2 mg Oral Q8H PRN Clapacs, Madie Reno, MD       And   LORazepam (ATIVAN) tablet 1 mg  1 mg Oral PRN Clapacs, Travius T, MD       magnesium hydroxide (MILK OF MAGNESIA) suspension 30 mL  30 mL Oral Daily PRN Clapacs, Roch T, MD   30 mL at 08/04/22 0941   metoprolol tartrate (LOPRESSOR) tablet 50 mg  50 mg Oral BID Clapacs, Rain T, MD   50 mg at 08/05/22 0912   oxybutynin (DITROPAN) tablet 2.5 mg  2.5 mg Oral TID Parks Ranger, DO   2.5 mg at 08/05/22 0911   pantoprazole (PROTONIX) EC tablet 40 mg  40 mg Oral Daily Clapacs, Rawleigh T, MD   40 mg at 08/05/22 0913   polyethylene glycol (MIRALAX / GLYCOLAX) packet 17 g  17 g Oral Daily PRN Clapacs, Xhaiden T, MD   17 g at 08/03/22 [id number]   QUEtiapine (SEROQUEL XR) 24 hr tablet 200 mg  200 mg Oral QHS Clapacs, Rivan T, MD   200 mg at 08/04/22 2122    rosuvastatin (CRESTOR) tablet 10 mg  10 mg Oral QPM Clapacs, Ashad T, MD   10 mg at 08/04/22 1701   thiamine (VITAMIN B1) tablet 100 mg  100 mg Oral Daily Clapacs, Madie Reno, MD   100 mg at 08/05/22 [id number]    Lab Results: No results found for this or any previous visit (from the past 48 hour(s)).  Blood Alcohol level:  Lab Results  Component Value Date   ETH <10 123456    Metabolic Disorder Labs: Lab Results  Component Value Date   HGBA1C 5.6 07/27/2022   MPG 114.02 07/27/2022   No results found for: "PROLACTIN" Lab Results  Component Value Date   CHOL 104 07/28/2022   TRIG 128 07/28/2022   HDL 38 (L) 07/28/2022   CHOLHDL 2.7 07/28/2022   VLDL 26 07/28/2022   LDLCALC 40 07/28/2022   LDLCALC 86 07/25/2021    Physical Findings: AIMS:  , ,  ,  ,    CIWA:    COWS:     Musculoskeletal: Strength & Muscle Tone: within normal limits Gait & Station: normal Patient leans: N/A  Psychiatric Specialty Exam:  Presentation  General Appearance:  Appropriate for Environment; Casual  Eye Contact: Good  Speech: Clear and Coherent; Normal Rate  Speech Volume: Normal  Handedness:No data recorded  Mood and Affect  Mood: Euthymic  Affect: Appropriate; Congruent   Thought Process  Thought Processes: Coherent  Descriptions of Associations:Intact  Orientation:Full (Time, Place and Person)  Thought Content:WDL  History of Schizophrenia/Schizoaffective disorder:No data recorded Duration of Psychotic Symptoms:No data recorded Hallucinations:No data recorded Ideas of Reference:None  Suicidal Thoughts:No data recorded Homicidal Thoughts:No data recorded  Sensorium  Memory: Immediate Fair; Recent Fair  Judgment: Fair  Insight: Fair; Shallow   Executive Functions  Concentration: Good  Attention Span: Good  Recall: [place] of Knowledge: Good  Language: Good   Psychomotor Activity  Psychomotor Activity:No data recorded  Assets   Assets: Desire for Improvement; Housing; Social Support; Talents/Skills   Sleep  Sleep:No data recorded    Blood pressure (!) 141/68, pulse 61, temperature 98.8 F (37.1 C), temperature source Oral, resp. rate 16, height '5\' 7"'$  (1.702 m), weight 89 kg, SpO2 100 %. Body mass index is 30.73 kg/m.   Treatment Plan Summary: Daily contact with patient to assess and evaluate symptoms and progress in treatment, Medication management, and Plan continue current medications.  Parks Ranger, DO 08/05/2022, 1:17 PM

## 2022-08-05 NOTE — Progress Notes (Signed)
D: Patient alert and oriented times 3. Pt denies SI, HI, AVH. Pt rates pain  6/10- PRN Tylenol given. Pt endorses anxiety and depression. No other concerns noted at this time.   A: Pt provided support and encouragement throughout the day. Scheduled medications given as prescribed. Takes medications whole without issue.    R: Pt remains safe on the unit with Q15 min safety checks in place. Will continue to monitor for changes.

## 2022-08-05 NOTE — Group Note (Signed)
Date:  08/05/2022 Time:  5:25 PM  Group Topic/Focus:  Overcoming Stress:   The focus of this group is to define stress and help patients assess their triggers. Personal Choices and Values:   The focus of this group is to help patients assess and explore the importance of values in their lives, how their values affect their decisions, how they express their values and what opposes their expression. Self Care:   The focus of this group is to help patients understand the importance of self-care in order to improve or restore emotional, physical, spiritual, interpersonal, and financial health.    Participation Level:  Active  Participation Quality:  Appropriate  Affect:  Appropriate  Cognitive:  Alert  Insight: Appropriate  Engagement in Group:  Engaged  Modes of Intervention:  Exploration and Socialization  Additional Comments:  na  Nolon Bussing 08/05/2022, 5:25 PM

## 2022-08-05 NOTE — Progress Notes (Signed)
   08/05/22 0912  Psych Admission Type (Psych Patients Only)  Admission Status Involuntary  Psychosocial Assessment  Patient Complaints None  Eye Contact Fair  Facial Expression Anxious  Affect Anxious  Speech Logical/coherent  Interaction Assertive  Motor Activity Slow  Appearance/Hygiene Unremarkable  Behavior Characteristics Cooperative  Mood Pleasant  Thought Process  Coherency WDL  Content WDL  Delusions None reported or observed  Perception WDL  Hallucination None reported or observed  Judgment Impaired  Confusion Mild  Danger to Self  Current suicidal ideation? Denies  Danger to Others  Danger to Others None reported or observed

## 2022-08-05 NOTE — Plan of Care (Signed)
  Problem: Education: Goal: Knowledge of General Education information will improve Description: Including pain rating scale, medication(s)/side effects and non-pharmacologic comfort measures Outcome: Progressing   Problem: Health Behavior/Discharge Planning: Goal: Ability to manage health-related needs will improve Outcome: Progressing   Problem: Clinical Measurements: Goal: Ability to maintain clinical measurements within normal limits will improve Outcome: Progressing Goal: Will remain free from infection Outcome: Progressing Goal: Diagnostic test results will improve Outcome: Progressing Goal: Respiratory complications will improve Outcome: Progressing Goal: Cardiovascular complication will be avoided Outcome: Progressing   Problem: Physical Regulation: Goal: Ability to maintain clinical measurements within normal limits will improve Outcome: Progressing   Problem: Safety: Goal: Periods of time without injury will increase Outcome: Progressing   Problem: Health Behavior/Discharge Planning: Goal: Identification of resources available to assist in meeting health care needs will improve Outcome: Progressing Goal: Compliance with treatment plan for underlying cause of condition will improve Outcome: Progressing   Problem: Coping: Goal: Ability to verbalize frustrations and anger appropriately will improve Outcome: Progressing Goal: Ability to demonstrate self-control will improve Outcome: Progressing   Problem: Safety: Goal: Periods of time without injury will increase Outcome: Progressing

## 2022-08-05 NOTE — BH Assessment (Signed)
1905 Received patient in dayroom visiting with wife. He is alert and oriented x3. He was in the day room watching TV. Gait is steady.  2030 Patient is alert and oriented  x 4, he is currently denying SI/HI, A/V hallucinations, and pain . Patient is endorsing depression 4-5/10 and anxiety 3-4/10.   His SBP is 172 and his DBP is 72, he is scheduled to receive 50 mg of Lopressor at bedtime. His currently pulse is 55. He is showing no symptoms of dizziness. Will continue to monitor patient for safety.   2125 Patient's pulse is checked by this writer manually and is noted to be 62 and he did received Lopressor 50 mg per scheduled dose. Will continue to monitor patient for safety when ambulating.  2345 Patient approached the nursing desk and wanted know where could he get the papers to start the process to be discharged because " I can only be held for 72 hours, and I ready to leave." It was explained to the patient that discharge will require a physician's order and assessment by the provider. Patient encouraged to speak with his provider in the morning. Patient appeared to be satisfied  with explanation.  0300 Patient has rested quietly in be, he has gotten up x 2 to use the bathroom. His gait was steady and he had no issues with using the walker. Will continue to monitor for safety.  0630 Patient has been up to the bathroom several more times. Remains safe with his walker.

## 2022-08-06 NOTE — Progress Notes (Signed)
   08/06/22 2155  Psych Admission Type (Psych Patients Only)  Admission Status Involuntary  Psychosocial Assessment  Patient Complaints None  Eye Contact Fair  Facial Expression Other (Comment) (appropriate)  Affect Appropriate to circumstance  Speech Logical/coherent  Interaction Assertive  Motor Activity Slow (uses walker)  Appearance/Hygiene Unremarkable  Behavior Characteristics Cooperative;Appropriate to situation  Mood Pleasant  Thought Process  Coherency WDL  Content WDL  Delusions None reported or observed  Perception WDL  Hallucination None reported or observed  Judgment Impaired  Confusion Mild  Danger to Self  Current suicidal ideation? Denies  Self-Injurious Behavior No self-injurious ideation or behavior indicators observed or expressed   Danger to Others  Danger to Others None reported or observed   Progress note   D: Pt seen in dayroom visiting with wife. Pt denies SI, HI, AVH. Pt rates pain  8/10 as acute pain in his bilateral thigh area that began when he came to the hospital post stroke. Pt rates anxiety  0/10 and depression  0/10. Pt states that lidocaine patches are not working for the pain in his legs. Says that he had a "perfect day" today. Pt is also seen socializing with his peers. Appears more adjusted to the environment d/t increased interaction. No other concerns noted at this time.  A: Pt provided support and encouragement. Pt given scheduled medication as prescribed. PRNs as appropriate. Q15 min checks for safety.   R: Pt safe on the unit. Will continue to monitor.

## 2022-08-06 NOTE — Plan of Care (Signed)

## 2022-08-06 NOTE — Progress Notes (Signed)
[facility] MD Progress Note  08/06/2022 2:16 PM [name]  MRN:  [id number] Subjective: [name] is seen on rounds.  His biggest complaint is that he has bilateral leg pain.  He is very happy with me since putting him on Ditropan because he is not waking up at night to go to the bathroom.  He states he has had incontinence for 10 years and has never been solved.  He has a history of A-fib and apparently had a CVA while he was staying with his brother because his wife does not feel she can care for him and that he is threatening at times.  According to the neurology discharge note he was sent to [facility] for ECT.  I asked him if he was depressed and he said no.  He is alert and oriented x 3, pleasant and cooperative.  He currently denies all signs and symptoms.  He apparently has a history of bipolar disorder but there are no notes in his chart review stating that he has seen a psychiatrist or been treated for behavioral health. Principal Problem: Bipolar affective disorder, depressed, severe ([place]) Diagnosis: Principal Problem:   Bipolar affective disorder, depressed, severe ([place]) Active Problems:   Dementia without behavioral disturbance ([place])  Total Time spent with patient: 15 minutes  Past Psychiatric History: Unknown  Past Medical History:  Past Medical History:  Diagnosis Date   Acquired hypothyroidism [id number]   Acute metabolic encephalopathy [id number]   Acute MI, true posterior wall, subsequent episode of care ([place]) 08/15/2014   EF 44% with mildly reduced LV function  Formatting of this note might be different from the original. EF 44% with mildly reduced LV function   AKI (acute kidney injury) ([place]) 09/20/2014   Anxiety    Aspiration pneumonia ([place]) 07/05/2022   Atrial fibrillation ([place]) 09/28/2019   ablation in past  Formatting of this note might be different from the original. ablation in past   Bipolar disorder [facility])    w/dementia/psychotic episodes   Bradycardia 09/17/2014    Cancer ([place]) 09/28/2019   Prostate Cancer; Throat Cancer  Formatting of this note might be different from the original. Prostate Cancer; Throat Cancer   Cardiac murmur 09/28/2019   Cataract    Cervical radicular pain 07/16/2019   Cervical spondylosis 06/19/2019   Chest pain with high risk for cardiac etiology 08/19/2014   Chronic depression    Chronic pain due to trauma 07/16/2019   Coffee ground emesis 07/07/2022   Confusion 07/07/2022   Corn of toe 12/11/2018   Coronary artery calcification 07/25/2021   Coronary artery disease 08/10/2014   Degenerative disc disease, cervical 09/29/2018   Degenerative lumbar disc    Dementia without behavioral disturbance ([place]) 07/16/2019   Disease characterized by destruction of skeletal muscle 08/15/2014   Essential hypertension 09/28/2019   Essential tremor 09/21/2014   History of MI (myocardial infarction) 09/14/2014   Hypercholesteremia    Hypertension    Hypotension 09/14/2014   Long term (current) use of anticoagulants 11/14/2021   Malignant neoplasm of prostate ([place])    Malnutrition of moderate degree [id number]   Metabolic encephalopathy [id number]   Mild aortic stenosis 11/26/2019   Mood change 10/12/2019   Myocardial infarction ([place]) 08/10/2014   Nausea and vomiting 08/19/2014   Onychomycosis of left great toe 08/21/2014   Pain of fifth toe 12/11/2018   Paroxysmal atrial fibrillation ([place]) 07/25/2021   Peptic esophagitis    Peripheral neuropathy    Peripheral neuropathy due to chemotherapy ([place]) 09/21/2014  Prolonged Q-T interval on ECG 08/19/2014   Prostate cancer ([place]) 09/28/2019   Reactive depression 10/12/2019   Renal insufficiency 08/17/2014   Right bundle branch block 09/28/2019   Sepsis ([place]) 05/18/2020   Small fiber neuropathy 12/16/2018   Squamous cell carcinoma of left tonsil (HCC)    Stage 3b chronic kidney disease ([place]) 12/20/2021   Throat cancer ([place])    Thyroid disease    Transaminitis 08/17/2014   Unstable  angina ([place]) 09/14/2014   Whiplash injury syndrome, sequela 08/11/2018    Past Surgical History:  Procedure Laterality Date   CARDIAC ELECTROPHYSIOLOGY STUDY AND ABLATION     COLONOSCOPY  04/07/2007   Small colonic polyp status post polypectomy. Pancolonic diverticulosis predominantly in the sigmoid colon. Internal hemorrhoids.    ESOPHAGOGASTRODUODENOSCOPY  06/15/2010   Erosive esophagitis. Status post PEG placment   EYE SURGERY     INGUINAL HERNIA REPAIR     PROSTATE BIOPSY     SEBACEOUS CYST REMOVAL     TONSILLECTOMY     Family History:  Family History  Problem Relation Age of Onset   Other Mother        brain tumor - unsure if it was cancer   Heart disease Father    Prostate cancer Father    Stomach cancer Father    Colon cancer Neg Hx    Esophageal cancer Neg Hx    Rectal cancer Neg Hx    Family Psychiatric  History: Unremarkable Social History:  Social History   Substance and Sexual Activity  Alcohol Use Not Currently     Social History   Substance and Sexual Activity  Drug Use Never    Social History   Socioeconomic History   Marital status: Married    Spouse name: Not on file   Number of children: 3   Years of education: 10th grade   Highest education level: Not on file  Occupational History   Occupation: Retired  Tobacco Use   Smoking status: Former    Types: Cigarettes    Quit date: 2011    Years since quitting: 13.1   Smokeless tobacco: Former  Scientific laboratory technician Use: Never used  Substance and Sexual Activity   Alcohol use: Not Currently   Drug use: Never   Sexual activity: Not Currently  Other Topics Concern   Not on file  Social History Narrative   Lives at home with wife.   Right-handed.   2 cups of caffeine per day.   Social Determinants of Health   Financial Resource Strain: Not on file  Food Insecurity: No Food Insecurity (08/02/2022)   Hunger Vital Sign    Worried About Running Out of Food in the Last Year: Never true    Ran  Out of Food in the Last Year: Never true  Transportation Needs: No Transportation Needs (08/02/2022)   PRAPARE - Hydrologist (Medical): No    Lack of Transportation (Non-Medical): No  Physical Activity: Not on file  Stress: Not on file  Social Connections: Not on file   Additional Social History:                         Sleep: Good  Appetite:  Good  Current Medications: Current Facility-Administered Medications  Medication Dose Route Frequency Provider Last Rate Last Admin   acetaminophen (TYLENOL) tablet 650 mg  650 mg Oral Q6H PRN Clapacs, Madie Reno, MD   650 mg at  08/05/22 0912   alum & mag hydroxide-simeth (MAALOX/MYLANTA) 200-200-20 MG/5ML suspension 30 mL  30 mL Oral Q4H PRN Clapacs, Enmanuel T, MD       amiodarone (PACERONE) tablet 200 mg  200 mg Oral Daily Clapacs, Tyreece T, MD   200 mg at 08/06/22 1019   apixaban (ELIQUIS) tablet 5 mg  5 mg Oral BID Clapacs, Rannie T, MD   5 mg at 08/06/22 1019   cholecalciferol (VITAMIN D3) 25 MCG (1000 UNIT) tablet 5,000 Units  5,000 Units Oral Daily Clapacs, Avant T, MD   5,000 Units at 08/06/22 1019   famotidine (PEPCID) tablet 20 mg  20 mg Oral Daily Clapacs, Fielding T, MD   20 mg at 08/06/22 1019   labetalol (NORMODYNE) injection 10 mg  10 mg Intravenous Q2H PRN Clapacs, Madie Reno, MD       lamoTRIgine (LAMICTAL) tablet 100 mg  100 mg Oral QHS Clapacs, Ramsey T, MD   100 mg at 08/05/22 2136   levothyroxine (SYNTHROID) tablet 125 mcg  125 mcg Oral Daily Clapacs, Jaedon T, MD   125 mcg at 08/06/22 [id number]   lidocaine (LIDODERM) 5 % 2 patch  2 patch Transdermal Q24H Clapacs, Avelardo T, MD   2 patch at 08/06/22 1017   LORazepam (ATIVAN) tablet 0.5 mg  0.5 mg Oral Q6H PRN Parks Ranger, DO       magnesium hydroxide (MILK OF MAGNESIA) suspension 30 mL  30 mL Oral Daily PRN Clapacs, Perrin T, MD   30 mL at 08/04/22 0941   metoprolol tartrate (LOPRESSOR) tablet 50 mg  50 mg Oral BID Clapacs, Elson T, MD   50 mg at 08/05/22 2132    oxybutynin (DITROPAN) tablet 2.5 mg  2.5 mg Oral TID Parks Ranger, DO   2.5 mg at 08/06/22 1017   pantoprazole (PROTONIX) EC tablet 40 mg  40 mg Oral Daily Clapacs, Farrell T, MD   40 mg at 08/06/22 1019   polyethylene glycol (MIRALAX / GLYCOLAX) packet 17 g  17 g Oral Daily PRN Clapacs, Antario T, MD   17 g at 08/03/22 [id number]   QUEtiapine (SEROQUEL XR) 24 hr tablet 200 mg  200 mg Oral QHS Clapacs, Xeng T, MD   200 mg at 08/05/22 2132   risperiDONE (RISPERDAL M-TABS) disintegrating tablet 2 mg  2 mg Oral Q8H PRN Clapacs, Jaimere T, MD       rosuvastatin (CRESTOR) tablet 10 mg  10 mg Oral QPM Clapacs, Sharon T, MD   10 mg at 08/05/22 1704   thiamine (VITAMIN B1) tablet 100 mg  100 mg Oral Daily Clapacs, Verland T, MD   100 mg at 08/06/22 1019    Lab Results: No results found for this or any previous visit (from the past 48 hour(s)).  Blood Alcohol level:  Lab Results  Component Value Date   ETH <10 123456    Metabolic Disorder Labs: Lab Results  Component Value Date   HGBA1C 5.6 07/27/2022   MPG 114.02 07/27/2022   No results found for: "PROLACTIN" Lab Results  Component Value Date   CHOL 104 07/28/2022   TRIG 128 07/28/2022   HDL 38 (L) 07/28/2022   CHOLHDL 2.7 07/28/2022   VLDL 26 07/28/2022   LDLCALC 40 07/28/2022   LDLCALC 86 07/25/2021    Physical Findings: AIMS:  , ,  ,  ,    CIWA:    COWS:     Musculoskeletal: Strength & Muscle Tone: within normal limits Gait &  Station: normal Patient leans: N/A  Psychiatric Specialty Exam:  Presentation  General Appearance:  Appropriate for Environment; Casual  Eye Contact: Good  Speech: Clear and Coherent; Normal Rate  Speech Volume: Normal  Handedness:No data recorded  Mood and Affect  Mood: Euthymic  Affect: Appropriate; Congruent   Thought Process  Thought Processes: Coherent  Descriptions of Associations:Intact  Orientation:Full (Time, Place and Person)  Thought Content:WDL  History of  Schizophrenia/Schizoaffective disorder:No data recorded Duration of Psychotic Symptoms:No data recorded Hallucinations:No data recorded Ideas of Reference:None  Suicidal Thoughts:No data recorded Homicidal Thoughts:No data recorded  Sensorium  Memory: Immediate Fair; Recent Fair  Judgment: Fair  Insight: Fair; Shallow   Executive Functions  Concentration: Good  Attention Span: Good  Recall: [place] of Knowledge: Good  Language: Good   Psychomotor Activity  Psychomotor Activity:No data recorded  Assets  Assets: Desire for Improvement; Housing; Social Support; Talents/Skills   Sleep  Sleep:No data recorded   Physical Exam: Physical Exam Vitals and nursing note reviewed.  Constitutional:      Appearance: Normal appearance. He is normal weight.  Neurological:     General: No focal deficit present.     Mental Status: He is alert and oriented to person, place, and time.  Psychiatric:        Attention and Perception: Attention and perception normal.        Mood and Affect: Mood and affect normal.        Speech: Speech normal.        Behavior: Behavior normal. Behavior is cooperative.        Thought Content: Thought content normal.        Cognition and Memory: Cognition and memory normal.        Judgment: Judgment normal.    Review of Systems  Constitutional: Negative.   HENT: Negative.    Eyes: Negative.   Respiratory: Negative.    Cardiovascular: Negative.   Gastrointestinal: Negative.   Genitourinary: Negative.   Musculoskeletal: Negative.   Skin: Negative.   Neurological: Negative.   Endo/Heme/Allergies: Negative.   Psychiatric/Behavioral: Negative.     Blood pressure (!) 110/54, pulse 68, temperature 98.5 F (36.9 C), temperature source Oral, resp. rate 20, height '5\' 7"'$  (1.702 m), weight 89 kg, SpO2 100 %. Body mass index is 30.73 kg/m.   Treatment Plan Summary: Daily contact with patient to assess and evaluate symptoms and  progress in treatment, Medication management, and Plan continue current medications.  Parks Ranger, DO 08/06/2022, 2:16 PM

## 2022-08-06 NOTE — Group Note (Signed)
Date:  08/06/2022 Time:  1:53 AM  Group Topic/Focus:  Healthy Communication:   The focus of this group is to discuss communication, barriers to communication, as well as healthy ways to communicate with others.    Participation Level:  Active  Participation Quality:  Appropriate  Affect:  Excited  Cognitive:  Alert  Insight: Appropriate  Engagement in Group:  Engaged  Modes of Intervention:  Support  Additional Comments:    Bradd Canary 08/06/2022, 1:53 AM

## 2022-08-06 NOTE — Group Note (Signed)
Date:  08/06/2022 Time:  11:00 AM  Group Topic/Focus:  Overcoming Stress:   The focus of this group is to define stress and help patients assess their triggers. Self Care:   The focus of this group is to help patients understand the importance of self-care in order to improve or restore emotional, physical, spiritual, interpersonal, and financial health.    Participation Level:  Active  Participation Quality:  Appropriate  Affect:  Appropriate  Cognitive:  Alert  Insight: Appropriate  Engagement in Group:  Engaged  Modes of Intervention:  Activity and Socialization  Additional Comments:  Bingo  Petrina Melby l Dymond Gutt 08/06/2022, 11:00 AM

## 2022-08-06 NOTE — Group Note (Signed)
Recreation Therapy Group Note   Group Topic:Communication  Group Date: 08/06/2022 Start Time: 1400 End Time: 1450 Facilitators: Vilma Prader, LRT, CTRS Location: Courtyard  Group Description: [employer]. LRT and NT brought pts outside to the courtyard to get fresh air and sunlight. During the time outside, we tossed around a beach ball that has many different prompts and questions on it while listening to music. After playing, pts had the option to go back inside, to walk around the courtyard, or sit and listen to music.   Affect/Mood: Appropriate and Happy   Participation Level: Active   Participation Quality: Independent   Behavior: Appropriate and Calm   Speech/Thought Process: Coherent   Insight: Good   Judgement: Good   Modes of Intervention: Activity   Patient Response to Interventions:  Engaged and Interested    Education Outcome:  Acknowledges education   Clinical Observations/Individualized Feedback: [name] was active in their participation of session activities and group discussion. Pt identified "wearing joggers in the winter and shorts in the summer" as something that he would wear to a party, which was one of the prompts. After playing, pt shared that getting outside really helped him and said "it is really crazy how much changes mentally when you are outside."  Pt was noted to be smiling and laughing with peers during, this is a brighter affect than in previous interactions with pt.   Plan: Continue to engage patient in RT group sessions 2-3x/week.   Vilma Prader, LRT, [place] 08/06/2022 2:53 PM

## 2022-08-06 NOTE — Progress Notes (Signed)
Patient with anxious affect.  Patient denies any feelings of anxiety or depression,  Denies SI/HI and AVH.  Guarded. Pain rated 5/10 in bilateral legs.  Patient wishes to speak with the doctor to get his release papers.  Patient states he is "doing much better now" and is ready to go home. "I need to be home with my wife."   15 min checks in place for safety.  Compliant with scheduled medications, but only after RN explained why each pill had been ordered.  Patient observed walking on the unit with out his walker.  Patient educated he is a high fall risk and use of the walker will help prevent falls. Expressed understanding. Patient participated in MHT and recreational therapy groups.   Metoprolol held per Dr. Louis Meckel. (BP 110/54,  pulse 68)

## 2022-08-06 NOTE — [facility] Suicide Risk Assessment (Signed)
[place] INPATIENT:  Family/Significant Other Suicide Prevention Education  Suicide Prevention Education:  Contact Attempts: Kahl Rupley, spouse, [phone]  has been identified by the patient as the family member/significant other with whom the patient will be residing, and identified as the person(s) who will aid the patient in the event of a mental health crisis.  With written consent from the patient, two attempts were made to provide suicide prevention education, prior to and/or following the patient's discharge.  We were unsuccessful in providing suicide prevention education.  A suicide education pamphlet was given to the patient to share with family/significant other.  Date and time of first attempt:08/06/22/3:26PM Date and time of second attempt:2nd attempt needed  Devora Tortorella A [place] 08/06/2022, 3:26 PM

## 2022-08-07 NOTE — Progress Notes (Addendum)
   08/07/22 2045  Vital Signs  Pulse Rate (!) 56  Pulse Rate Source Radial;Left  BP (!) 200/85 (pt states he is anxious but in NAD otherwise)  BP Location Left Arm  BP Method Manual  Patient Position (if appropriate) Sitting   Pt BP assessed manually after being taken by [employer] twice. Pt in NAD. Did say that he was anxious. Provider notified. Awaiting a call back.  2112 - Per provider, give nighttime medications (hold metoprolol d/t HR) and add PRN Ativan for anxiety. Recheck VS at 2300.

## 2022-08-07 NOTE — Progress Notes (Signed)
   08/07/22 2307  Vital Signs  Pulse Rate (!) 56  Pulse Rate Source Monitor  Resp 16  BP (!) 108/52  BP Location Left Arm  BP Method Automatic  Patient Position (if appropriate) Lying  Oxygen Therapy  SpO2 96 %  O2 Device Room Air  Pain Assessment  Pain Scale 0-10  Pain Score 8   Notified provider of new BP reading. Pt was asleep but easy to arouse. NAD. Says he is feeling less anxious now. Will continue to monitor and pass information on to day shift.

## 2022-08-07 NOTE — Progress Notes (Signed)
[facility] MD Progress Note  08/07/2022 12:30 PM [name]  MRN:  [id number] Subjective: [name] is seen on rounds.  He has been in good controls.  Social work spoke with his wife who states he has these episodes where he becomes very impulsive and agitated.  He has not had any of that on the unit.  She also informed us that he had ECT back in 2016 at [facility] for history of bipolar disorder.  She says he does not take his medications all the time and that is when he has these episodes.  Been compliant with medications on the unit.  His biggest complaint was urinary incontinence which she states is much less since starting Ditropan.  Now, he has bilateral leg pain since his stroke a week ago.  I told him I would consult PT for him. Principal Problem: Bipolar affective disorder, depressed, severe ([place]) Diagnosis: Principal Problem:   Bipolar affective disorder, depressed, severe ([place]) Active Problems:   Dementia without behavioral disturbance ([place])  Total Time spent with patient: 15 minutes  Past Psychiatric History: Supposed history of bipolar disorder  Past Medical History:  Past Medical History:  Diagnosis Date   Acquired hypothyroidism [id number]   Acute metabolic encephalopathy [id number]   Acute MI, true posterior wall, subsequent episode of care ([place]) 08/15/2014   EF 44% with mildly reduced LV function  Formatting of this note might be different from the original. EF 44% with mildly reduced LV function   AKI (acute kidney injury) ([place]) 09/20/2014   Anxiety    Aspiration pneumonia ([place]) 07/05/2022   Atrial fibrillation ([place]) 09/28/2019   ablation in past  Formatting of this note might be different from the original. ablation in past   Bipolar disorder [facility])    w/dementia/psychotic episodes   Bradycardia 09/17/2014   Cancer ([place]) 09/28/2019   Prostate Cancer; Throat Cancer  Formatting of this note might be different from the original. Prostate Cancer; Throat Cancer   Cardiac  murmur 09/28/2019   Cataract    Cervical radicular pain 07/16/2019   Cervical spondylosis 06/19/2019   Chest pain with high risk for cardiac etiology 08/19/2014   Chronic depression    Chronic pain due to trauma 07/16/2019   Coffee ground emesis 07/07/2022   Confusion 07/07/2022   Corn of toe 12/11/2018   Coronary artery calcification 07/25/2021   Coronary artery disease 08/10/2014   Degenerative disc disease, cervical 09/29/2018   Degenerative lumbar disc    Dementia without behavioral disturbance ([place]) 07/16/2019   Disease characterized by destruction of skeletal muscle 08/15/2014   Essential hypertension 09/28/2019   Essential tremor 09/21/2014   History of MI (myocardial infarction) 09/14/2014   Hypercholesteremia    Hypertension    Hypotension 09/14/2014   Long term (current) use of anticoagulants 11/14/2021   Malignant neoplasm of prostate ([place])    Malnutrition of moderate degree [id number]   Metabolic encephalopathy [id number]   Mild aortic stenosis 11/26/2019   Mood change 10/12/2019   Myocardial infarction ([place]) 08/10/2014   Nausea and vomiting 08/19/2014   Onychomycosis of left great toe 08/21/2014   Pain of fifth toe 12/11/2018   Paroxysmal atrial fibrillation ([place]) 07/25/2021   Peptic esophagitis    Peripheral neuropathy    Peripheral neuropathy due to chemotherapy ([place]) 09/21/2014   Prolonged Q-T interval on ECG 08/19/2014   Prostate cancer ([place]) 09/28/2019   Reactive depression 10/12/2019   Renal insufficiency 08/17/2014   Right bundle branch block 09/28/2019   Sepsis ([place]) 05/18/2020  Small fiber neuropathy 12/16/2018   Squamous cell carcinoma of left tonsil (HCC)    Stage 3b chronic kidney disease ([place]) 12/20/2021   Throat cancer ([place])    Thyroid disease    Transaminitis 08/17/2014   Unstable angina ([place]) 09/14/2014   Whiplash injury syndrome, sequela 08/11/2018    Past Surgical History:  Procedure Laterality Date   CARDIAC ELECTROPHYSIOLOGY STUDY  AND ABLATION     COLONOSCOPY  04/07/2007   Small colonic polyp status post polypectomy. Pancolonic diverticulosis predominantly in the sigmoid colon. Internal hemorrhoids.    ESOPHAGOGASTRODUODENOSCOPY  06/15/2010   Erosive esophagitis. Status post PEG placment   EYE SURGERY     INGUINAL HERNIA REPAIR     PROSTATE BIOPSY     SEBACEOUS CYST REMOVAL     TONSILLECTOMY     Family History:  Family History  Problem Relation Age of Onset   Other Mother        brain tumor - unsure if it was cancer   Heart disease Father    Prostate cancer Father    Stomach cancer Father    Colon cancer Neg Hx    Esophageal cancer Neg Hx    Rectal cancer Neg Hx    Family Psychiatric  History: Unremarkable Social History:  Social History   Substance and Sexual Activity  Alcohol Use Not Currently     Social History   Substance and Sexual Activity  Drug Use Never    Social History   Socioeconomic History   Marital status: Married    Spouse name: Not on file   Number of children: 3   Years of education: 10th grade   Highest education level: Not on file  Occupational History   Occupation: Retired  Tobacco Use   Smoking status: Former    Types: Cigarettes    Quit date: 2011    Years since quitting: 13.1   Smokeless tobacco: Former  Scientific laboratory technician Use: Never used  Substance and Sexual Activity   Alcohol use: Not Currently   Drug use: Never   Sexual activity: Not Currently  Other Topics Concern   Not on file  Social History Narrative   Lives at home with wife.   Right-handed.   2 cups of caffeine per day.   Social Determinants of Health   Financial Resource Strain: Not on file  Food Insecurity: No Food Insecurity (08/02/2022)   Hunger Vital Sign    Worried About Running Out of Food in the Last Year: Never true    Ran Out of Food in the Last Year: Never true  Transportation Needs: No Transportation Needs (08/02/2022)   PRAPARE - Hydrologist  (Medical): No    Lack of Transportation (Non-Medical): No  Physical Activity: Not on file  Stress: Not on file  Social Connections: Not on file   Additional Social History:                         Sleep: Good  Appetite:  Good  Current Medications: Current Facility-Administered Medications  Medication Dose Route Frequency Provider Last Rate Last Admin   acetaminophen (TYLENOL) tablet 650 mg  650 mg Oral Q6H PRN Clapacs, Goodwin T, MD   650 mg at 08/06/22 2133   alum & mag hydroxide-simeth (MAALOX/MYLANTA) 200-200-20 MG/5ML suspension 30 mL  30 mL Oral Q4H PRN Clapacs, Madie Reno, MD       amiodarone (PACERONE) tablet 200 mg  200 mg Oral Daily Clapacs, Devyn T, MD   200 mg at 08/07/22 [id number]   apixaban (ELIQUIS) tablet 5 mg  5 mg Oral BID Clapacs, Madie Reno, MD   5 mg at 08/07/22 [id number]   cholecalciferol (VITAMIN D3) 25 MCG (1000 UNIT) tablet 5,000 Units  5,000 Units Oral Daily Clapacs, Madie Reno, MD   5,000 Units at 08/07/22 0921   famotidine (PEPCID) tablet 20 mg  20 mg Oral Daily Clapacs, Vaden T, MD   20 mg at 08/07/22 [id number]   labetalol (NORMODYNE) injection 10 mg  10 mg Intravenous Q2H PRN Clapacs, Madie Reno, MD       lamoTRIgine (LAMICTAL) tablet 100 mg  100 mg Oral QHS Clapacs, Elijah T, MD   100 mg at 08/06/22 2133   levothyroxine (SYNTHROID) tablet 125 mcg  125 mcg Oral Daily Clapacs, Jenny T, MD   125 mcg at 08/07/22 0923   lidocaine (LIDODERM) 5 % 2 patch  2 patch Transdermal Q24H Clapacs, Dianna T, MD   2 patch at 08/06/22 1017   LORazepam (ATIVAN) tablet 0.5 mg  0.5 mg Oral Q6H PRN Parks Ranger, DO       magnesium hydroxide (MILK OF MAGNESIA) suspension 30 mL  30 mL Oral Daily PRN Clapacs, Bacilio T, MD   30 mL at 08/04/22 0941   metoprolol tartrate (LOPRESSOR) tablet 50 mg  50 mg Oral BID Clapacs, Leeland T, MD   50 mg at 08/07/22 [id number]   oxybutynin (DITROPAN) tablet 2.5 mg  2.5 mg Oral TID Parks Ranger, DO   2.5 mg at 08/07/22 [id number]   pantoprazole (PROTONIX) EC tablet 40 mg  40  mg Oral Daily Clapacs, Madie Reno, MD   40 mg at 08/07/22 [id number]   polyethylene glycol (MIRALAX / GLYCOLAX) packet 17 g  17 g Oral Daily PRN Clapacs, Ignatius T, MD   17 g at 08/03/22 [id number]   QUEtiapine (SEROQUEL XR) 24 hr tablet 200 mg  200 mg Oral QHS Clapacs, Larrell T, MD   200 mg at 08/06/22 2133   risperiDONE (RISPERDAL M-TABS) disintegrating tablet 2 mg  2 mg Oral Q8H PRN Clapacs, Taisei T, MD       rosuvastatin (CRESTOR) tablet 10 mg  10 mg Oral QPM Clapacs, Dresean T, MD   10 mg at 08/06/22 1713   thiamine (VITAMIN B1) tablet 100 mg  100 mg Oral Daily Clapacs, Madie Reno, MD   100 mg at 08/07/22 [id number]    Lab Results: No results found for this or any previous visit (from the past 48 hour(s)).  Blood Alcohol level:  Lab Results  Component Value Date   ETH <10 123456    Metabolic Disorder Labs: Lab Results  Component Value Date   HGBA1C 5.6 07/27/2022   MPG 114.02 07/27/2022   No results found for: "PROLACTIN" Lab Results  Component Value Date   CHOL 104 07/28/2022   TRIG 128 07/28/2022   HDL 38 (L) 07/28/2022   CHOLHDL 2.7 07/28/2022   VLDL 26 07/28/2022   LDLCALC 40 07/28/2022   LDLCALC 86 07/25/2021    Physical Findings: AIMS:  , ,  ,  ,    CIWA:    COWS:     Musculoskeletal: Strength & Muscle Tone: within normal limits Gait & Station: normal Patient leans: N/A  Psychiatric Specialty Exam:  Presentation  General Appearance:  Appropriate for Environment; Casual  Eye Contact: Good  Speech: Clear and Coherent; Normal Rate  Speech Volume: Normal  Handedness:No  data recorded  Mood and Affect  Mood: Euthymic  Affect: Appropriate; Congruent   Thought Process  Thought Processes: Coherent  Descriptions of Associations:Intact  Orientation:Full (Time, Place and Person)  Thought Content:WDL  History of Schizophrenia/Schizoaffective disorder:No data recorded Duration of Psychotic Symptoms:No data recorded Hallucinations:No data recorded Ideas of  Reference:None  Suicidal Thoughts:No data recorded Homicidal Thoughts:No data recorded  Sensorium  Memory: Immediate Fair; Recent Fair  Judgment: Fair  Insight: Fair; Shallow   Executive Functions  Concentration: Good  Attention Span: Good  Recall: [place] of Knowledge: Good  Language: Good   Psychomotor Activity  Psychomotor Activity:No data recorded  Assets  Assets: Desire for Improvement; Housing; Social Support; Talents/Skills   Sleep  Sleep:No data recorded   Physical Exam: Physical Exam Vitals and nursing note reviewed.  Constitutional:      Appearance: Normal appearance. He is normal weight.  Neurological:     General: No focal deficit present.     Mental Status: He is alert and oriented to person, place, and time.  Psychiatric:        Attention and Perception: Attention and perception normal.        Mood and Affect: Mood and affect normal.        Speech: Speech normal.        Behavior: Behavior normal. Behavior is cooperative.        Thought Content: Thought content normal.        Cognition and Memory: Cognition and memory normal.        Judgment: Judgment normal.    Review of Systems  Constitutional: Negative.   HENT: Negative.    Eyes: Negative.   Respiratory: Negative.    Cardiovascular: Negative.   Gastrointestinal: Negative.   Genitourinary: Negative.   Musculoskeletal: Negative.   Skin: Negative.   Neurological: Negative.   Endo/Heme/Allergies: Negative.   Psychiatric/Behavioral: Negative.     Blood pressure (!) 159/72, pulse 69, temperature (!) 97.4 F (36.3 C), temperature source Oral, resp. rate 18, height '5\' 7"'$  (1.702 m), weight 89 kg, SpO2 97 %. Body mass index is 30.73 kg/m.   Treatment Plan Summary: Daily contact with patient to assess and evaluate symptoms and progress in treatment, Medication management, and Plan consult PT.  Parks Ranger, DO 08/07/2022, 12:30 PM

## 2022-08-07 NOTE — Progress Notes (Signed)
   08/07/22 2144  Psych Admission Type (Psych Patients Only)  Admission Status Involuntary  Psychosocial Assessment  Patient Complaints Anxiety  Eye Contact Fair  Facial Expression Anxious  Affect Appropriate to circumstance  Speech Logical/coherent  Interaction Assertive  Motor Activity Slow  Appearance/Hygiene Unremarkable  Behavior Characteristics Cooperative;Appropriate to situation  Mood Pleasant;Anxious  Thought Process  Coherency WDL  Content WDL  Delusions None reported or observed  Perception WDL  Hallucination None reported or observed  Judgment Impaired  Confusion None  Danger to Self  Current suicidal ideation? Denies  Self-Injurious Behavior No self-injurious ideation or behavior indicators observed or expressed   Danger to Others  Danger to Others None reported or observed   Progress note   D: Pt seen in dayroom with his daughter visiting. Pt denies Ii, HI, AVH. Pt rates pain  8/10. Pt maintains that pain stays the same. Describes it as muscles spasming and cramping. Will pass this information along to provider tomorrow. Pt states that he is anxious but a "good" anxious. "The doctor came to see me today and told me he would have me out of here by Friday. That was the best news all day." Pt BP has been elevated this evening. NAD. Denies headache or other symptoms. Pt active in milieu. No other concerns noted at this time.  A: Pt provided support and encouragement. Pt given scheduled medication as prescribed. PRNs as appropriate. Q15 min checks for safety.   R: Pt safe on the unit. Will continue to monitor.

## 2022-08-07 NOTE — Group Note (Signed)
[facility] LCSW Group Therapy Note   Group Date: 08/07/2022 Start Time: 1300 End Time: 1400   Type of Therapy/Topic:  Group Therapy:  Emotion Regulation  Participation Level:  Active     Description of Group:    The purpose of this group is to assist patients in learning to regulate negative emotions and experience positive emotions. Patients will be guided to discuss ways in which they have been vulnerable to their negative emotions. These vulnerabilities will be juxtaposed with experiences of positive emotions or situations, and patients challenged to use positive emotions to combat negative ones. Special emphasis will be placed on coping with negative emotions in conflict situations, and patients will process healthy conflict resolution skills.  Therapeutic Goals: Patient will identify two positive emotions or experiences to reflect on in order to balance out negative emotions:  Patient will label two or more emotions that they find the most difficult to experience:  Patient will be able to demonstrate positive conflict resolution skills through discussion or role plays:   Summary of Patient Progress:   Patient was present for the entirety of the group session. Patient was an active listener and participated in the topic of discussion, provided helpful advice to others, and added nuance to topic of conversation. Patient had normal mood. He said that he was "grateful to God" and wanted to "pray everyday" as a way to regulate his emotions. He said doing breathing exercises was a surprise because learned how impactful they could be.      Therapeutic Modalities:   Cognitive Behavioral Therapy Feelings Identification Dialectical Behavioral Therapy   Nayeliz Hipp A [place], LCSWA

## 2022-08-07 NOTE — Progress Notes (Signed)
Patient to nurse's station with animated affect.  Pleasant with staff.  Patient reports he slept well.  Denies feelings of anxiety or  depression.  Denies SI/HI and AVH.  Pain rated 5/10 in upper legs. Patient reports the patches applied for pain have not been effective. Compliant with scheduled medications, but requests each medication is reviewed before administration.  15 min checks in place for safety.  Present in the milieu, socializing with peers.  Participated in MHT, social work and recreational therapy groups.

## 2022-08-07 NOTE — B[facility] Suicide Risk Assessment (Signed)
[facility] INPATIENT:  Family/Significant Other Suicide Prevention Education  Suicide Prevention Education:  Education Completed; Brayten, Langa, spouse,has been identified by the patient as the family member/significant other with whom the patient will be residing, and identified as the person(s) who will aid the patient in the event of a mental health crisis (suicidal ideations/suicide attempt).  With written consent from the patient, the family member/significant other has been provided the following suicide prevention education, prior to the and/or following the discharge of the patient.   The suicide prevention education provided includes the following: Suicide risk factors Suicide prevention and interventions National Suicide Hotline telephone number [facility] assessment telephone number [facility] Emergency Assistance [place] and/or Residential Mobile Crisis Unit telephone number  Request made of family/significant other to: Remove weapons (e.g., guns, rifles, knives), all items previously/currently identified as safety concern.   Remove drugs/medications (over-the-counter, prescriptions, illicit drugs), all items previously/currently identified as a safety concern.  The family member/significant other verbalizes understanding of the suicide prevention education information provided.  The family member/significant other agrees to remove the items of safety concern listed above.  Nocole Zammit A [place] 08/07/2022, 9:36 AM

## 2022-08-07 NOTE — BHH Counselor (Signed)
CSW contacted pt's spouse, Bravery Gable, [phone], to obtain collateral information and develop appropriate discharge plan.   She stated that pt was diagnosed with Bipolar Disorder as well as Schizophrenia in 2016. She stated he exhibited bizarre behaviors of wanting to buy some wood and put it on his back like  a cross and walk up and down the road by his home. She said pt was admitted to [facility] inpatient and he received ECT treatments outpatient and was under the care of Dr. Geanie Kenning.She stated that pt was  "good" for the next 4 years and has in recent years been erratic and "mean" and "not sure what will set him off."  She said pt does have firearms which are locked up and she has the key.  She said she would remove the weapons but it caused an issue with pt the last time they were removed so she feels hesitant to do so.   She said that pt does not have a psychiatrist but is interested in referral and can drive to [facility] if needed for appointments. She said that pt prescribed psychiatric medications  Seroquel, Lamictal and Ativan and that she assists pt in giving the medication but he does not always want to take it.   She said pt can return home "if he swears up and down he will change." She also stated that pt's brother may be willing to take pt home at discharge if needed.   No other requests were made. Conversation ended without incident.   Catherene Kaleta [place], MSW, LCSW-A 2/27/20249:48 AM

## 2022-08-07 NOTE — Evaluation (Signed)
Physical Therapy Evaluation Patient Details Name: [name] MRN: [id number] DOB: Nov 09, 1940 Today's Date: 08/07/2022  History of Present Illness  82 y.o. M admitted with depression and possible (given prior benefits) from ECT. On 07/27/22 had acute onset of R sided weakness, slurred speech, and confusion. Pt received TPN at 1012a on 07/27/22. CT and MRI were negative, likely TIA. PMH includes: dementia, HTN, afib on warfarin, bipolar disorder, cataracts, CAD, MI, throat cancer s/p chemo and radiation.  Clinical Impression  Pt was able to ambulate and move well and safely.  Pt with no overt safety/balance issues during prolonged ambulation w/o AD, including direction, speed, visual changes as well as other challenges.  Pt's biggest complaint is b/l quad soreness; educated on different ways to stretch (pt able to demo back) as well as cuing for considerations with returning to normal activity level.  Pt overall tolerated all activities well, STS was 26sec w/o UE use.  Pt reports feeling close to his baseline in the physical/mobility framework.   Discussed remaining active, does not need continued formal PT at this time - pt agrees.     Recommendations for follow up therapy are one component of a multi-disciplinary discharge planning process, led by the attending physician.  Recommendations may be updated based on patient status, additional functional criteria and insurance authorization.  Follow Up Recommendations No PT follow up      Assistance Recommended at Discharge Intermittent Supervision/Assistance  Patient can return home with the following  Assist for transportation    Equipment Recommendations None recommended by PT  Recommendations for Other Services       Functional Status Assessment Patient has not had a recent decline in their functional status     Precautions / Restrictions Precautions Precautions: Fall Restrictions Weight Bearing Restrictions: No       Mobility  Bed Mobility Overal bed mobility: Independent             General bed mobility comments: easily able to maneuver in bed    Transfers Overall transfer level: Modified independent Equipment used: None Transfers: Sit to/from Stand Sit to Stand: Supervision           General transfer comment: 5x STS = 26sec, no UEs    Ambulation/Gait Ambulation/Gait assistance: Min guard Gait Distance (Feet): 300 Feet Assistive device: None         General Gait Details: Pt was able to maintain consistent, community appropriate cadence w/o cuing.  Able to do head turns/nods, change speeds, stop, stop and turn 180*, high step and avoid obstacles w/o LOBs, appropriate changes to speed with challeneges w/o safety issues  Stairs            Wheelchair Mobility    Modified Rankin (Stroke Patients Only)       Balance Overall balance assessment: Modified Independent   Sitting balance-Leahy Scale: Normal       Standing balance-Leahy Scale: Good                               Pertinent Vitals/Pain Pain Assessment Pain Assessment: Faces Faces Pain Scale: Hurts a little bit Pain Location: b/l quads Pain Descriptors / Indicators: Aching, Discomfort    Home Living Family/patient expects to be discharged to:: Unsure Living Arrangements: Spouse/significant other Available Help at Discharge: Family Type of Home: House Home Access: Stairs to enter Entrance Stairs-Rails: None Entrance Stairs-Number of Steps: 2   Home Layout: One level Home  Equipment: Grab bars - tub/shower;Hand held shower head;Shower Land (2 wheels);Rollator (4 wheels)      Prior Function Prior Level of Function :  (pt indicates that he is normally able to be very active, mows the yard, activities around the home, etc)             Mobility Comments: independent mobility ADLs Comments: reports independent     Hand Dominance        Extremity/Trunk  Assessment   Upper Extremity Assessment Upper Extremity Assessment: Overall WFL for tasks assessed (equal b/l)    Lower Extremity Assessment Lower Extremity Assessment: Overall WFL for tasks assessed (equal b/l)       Communication   Communication: No difficulties  Cognition Arousal/Alertness: Awake/alert Behavior During Therapy: WFL for tasks assessed/performed Overall Cognitive Status: History of cognitive impairments - at baseline                                 General Comments: pleasant and engaged with PT.  Good situational awareness, states date and location.        General Comments General comments (skin integrity, edema, etc.): pt's biggest physical concern is b/l quad soreness, educated on exercise/activity/stretches.  He showed surprisingly good knee mobility and was able to do EOB quad stretches b/l    Exercises     Assessment/Plan    PT Assessment Patient does not need any further PT services  PT Problem List Decreased balance;Decreased knowledge of use of DME;Decreased mobility;Decreased safety awareness       PT Treatment Interventions      PT Goals (Current goals can be found in the Care Plan section)  Acute Rehab PT Goals Patient Stated Goal: get feeling better mentally PT Goal Formulation: All assessment and education complete, DC therapy    Frequency       Co-evaluation               AM-PAC PT "6 Clicks" Mobility  Outcome Measure Help needed turning from your back to your side while in a flat bed without using bedrails?: None Help needed moving from lying on your back to sitting on the side of a flat bed without using bedrails?: None Help needed moving to and from a bed to a chair (including a wheelchair)?: None Help needed standing up from a chair using your arms (e.g., wheelchair or bedside chair)?: None Help needed to walk in hospital room?: None Help needed climbing 3-5 steps with a railing? : None 6 Click Score: 24     End of Session Equipment Utilized During Treatment: Gait belt Activity Tolerance: Patient tolerated treatment well Patient left: with call bell/phone within reach;in bed;with nursing/sitter in room Nurse Communication: Mobility status PT Visit Diagnosis: Muscle weakness (generalized) (M62.81);Pain;Other abnormalities of gait and mobility (R26.89) Pain - Right/Left: Right Pain - part of body: Leg    Time: 1535-1555 PT Time Calculation (min) (ACUTE ONLY): 20 min   Charges:   PT Evaluation $PT Eval Low Complexity: 1 Low          Kreg Shropshire, DPT 08/07/2022, 4:33 PM

## 2022-08-07 NOTE — Group Note (Signed)
Recreation Therapy Group Note   Group Topic:Healthy Support Systems  Group Date: 08/07/2022 Start Time: 1400 End Time: 1455 Facilitators: Vilma Prader, LRT, CTRS Location:  Dayroom  Group Description: Straw Bridge. Individually, patients were given 10 plastic drinking straws and an equal length of masking tape. Using the materials provided, patients were instructed to build a free-standing bridge-like structure to suspend an everyday item (ex: puzzle box) off the floor or table surface. All materials were required to be used in Conservation officer, nature. LRT facilitated post-activity discussion reviewing how we, humans, are like the structure we built; when things get too heavy in our life and we do not have adequate supports/coping skills, then we will fall just like the straw-built structure will. LRT focused on how having a "base" or structure on the bottom was necessary for the object to stand, meaning we must be secure and stable first before building on ourselves or others. Patients were encouraged to reflect how the skills used in this activity can be generalized to daily life post discharge.  Affect/Mood: Appropriate and Happy   Participation Level: Active and Engaged   Participation Quality: Independent   Behavior: Alert and Appropriate   Speech/Thought Process: Coherent   Insight: Good   Judgement: Moderate   Modes of Intervention: Activity   Patient Response to Interventions:  Engaged and Receptive   Education Outcome:  Acknowledges education   Clinical Observations/Individualized Feedback: [name] was active in their participation of session activities and group discussion. Pt chose to work as a Camera operator with another peer. Pt worked well with peer and communicated effectively to build a structure. Pt was making jokes throughout group and ended up taping a peanut butter cracker to their structure to be funny. Pt seemed to be in bright spirits in comparison to previous interactions.     Plan: Continue to engage patient in RT group sessions 2-3x/week.   Vilma Prader, LRT, [place] 08/07/2022 2:59 PM

## 2022-08-07 NOTE — Plan of Care (Signed)

## 2022-08-07 NOTE — Group Note (Signed)
Date:  08/07/2022 Time:  4:50 PM  Group Topic/Focus:  Goals Group:   The focus of this group is to help patients establish daily goals to achieve during treatment and discuss how the patient can incorporate goal setting into their daily lives to aide in recovery.    Participation Level:  Active  Participation Quality:  Appropriate and Attentive  Affect:  Appropriate  Cognitive:  Alert, Appropriate, and Oriented  Insight: Appropriate and Good  Engagement in Group:  Engaged  Modes of Intervention:  Activity and Discussion  Additional Comments:  His goal was to get more insight about discharge planning .  Ladona Mow 08/07/2022, 4:50 PM

## 2022-08-08 NOTE — Group Note (Signed)
LCSW Group Therapy Note  Group Date: 08/08/2022 Start Time: [id number] End Time: 1400   Type of Therapy and Topic:  Group Therapy - Healthy vs Unhealthy Coping Skills  Participation Level:  Active   Description of Group The focus of this group was to determine what unhealthy coping techniques typically are used by group members and what healthy coping techniques would be helpful in coping with various problems. Patients were guided in becoming aware of the differences between healthy and unhealthy coping techniques. Patients were asked to identify 2-3 healthy coping skills they would like to learn to use more effectively.  Therapeutic Goals Patients learned that coping is what human beings do all day long to deal with various situations in their lives Patients defined and discussed healthy vs unhealthy coping techniques Patients identified their preferred coping techniques and identified whether these were healthy or unhealthy Patients determined 2-3 healthy coping skills they would like to become more familiar with and use more often. Patients provided support and ideas to each other   Summary of Patient Progress:  During group, he expressed that he "planted roses" and finds "it soothing." Patient proved open to input from peers and feedback from [place]. Patient demonstrated fair insight into the subject matter, was respectful of peers, and participated throughout the entire session.   Therapeutic Modalities Cognitive Behavioral Therapy Motivational Interviewing  Raylin Winer A [place], LCSWA 08/08/2022  2:19 PM

## 2022-08-08 NOTE — Progress Notes (Signed)
[facility] MD Progress Note  08/08/2022 1:22 PM Mati Stellwagen  MRN:  [id number] Subjective: [name] is seen on rounds.  He says that he slept well.  He has been compliant with medications and he feels like they have been helpful.  Says he feels much better now that his incontinence has been addressed.  Social work spoke with his wife who did not have any problems with him coming home.  He has not had any episodes while on the unit. Principal Problem: Bipolar affective disorder, depressed, severe ([place]) Diagnosis: Principal Problem:   Bipolar affective disorder, depressed, severe ([place]) Active Problems:   Dementia without behavioral disturbance ([place])  Total Time spent with patient: 15 minutes  Past Psychiatric History: History of bipolar disorder and ECT.  Past Medical History:  Past Medical History:  Diagnosis Date   Acquired hypothyroidism [id number]   Acute metabolic encephalopathy [id number]   Acute MI, true posterior wall, subsequent episode of care ([place]) 08/15/2014   EF 44% with mildly reduced LV function  Formatting of this note might be different from the original. EF 44% with mildly reduced LV function   AKI (acute kidney injury) ([place]) 09/20/2014   Anxiety    Aspiration pneumonia ([place]) 07/05/2022   Atrial fibrillation ([place]) 09/28/2019   ablation in past  Formatting of this note might be different from the original. ablation in past   Bipolar disorder [facility])    w/dementia/psychotic episodes   Bradycardia 09/17/2014   Cancer ([place]) 09/28/2019   Prostate Cancer; Throat Cancer  Formatting of this note might be different from the original. Prostate Cancer; Throat Cancer   Cardiac murmur 09/28/2019   Cataract    Cervical radicular pain 07/16/2019   Cervical spondylosis 06/19/2019   Chest pain with high risk for cardiac etiology 08/19/2014   Chronic depression    Chronic pain due to trauma 07/16/2019   Coffee ground emesis 07/07/2022   Confusion 07/07/2022   Corn of toe 12/11/2018    Coronary artery calcification 07/25/2021   Coronary artery disease 08/10/2014   Degenerative disc disease, cervical 09/29/2018   Degenerative lumbar disc    Dementia without behavioral disturbance ([place]) 07/16/2019   Disease characterized by destruction of skeletal muscle 08/15/2014   Essential hypertension 09/28/2019   Essential tremor 09/21/2014   History of MI (myocardial infarction) 09/14/2014   Hypercholesteremia    Hypertension    Hypotension 09/14/2014   Long term (current) use of anticoagulants 11/14/2021   Malignant neoplasm of prostate ([place])    Malnutrition of moderate degree [id number]   Metabolic encephalopathy [id number]   Mild aortic stenosis 11/26/2019   Mood change 10/12/2019   Myocardial infarction ([place]) 08/10/2014   Nausea and vomiting 08/19/2014   Onychomycosis of left great toe 08/21/2014   Pain of fifth toe 12/11/2018   Paroxysmal atrial fibrillation ([place]) 07/25/2021   Peptic esophagitis    Peripheral neuropathy    Peripheral neuropathy due to chemotherapy ([place]) 09/21/2014   Prolonged Q-T interval on ECG 08/19/2014   Prostate cancer ([place]) 09/28/2019   Reactive depression 10/12/2019   Renal insufficiency 08/17/2014   Right bundle branch block 09/28/2019   Sepsis ([place]) 05/18/2020   Small fiber neuropathy 12/16/2018   Squamous cell carcinoma of left tonsil (HCC)    Stage 3b chronic kidney disease ([place]) 12/20/2021   Throat cancer ([place])    Thyroid disease    Transaminitis 08/17/2014   Unstable angina ([place]) 09/14/2014   Whiplash injury syndrome, sequela 08/11/2018    Past Surgical History:  Procedure  Laterality Date   CARDIAC ELECTROPHYSIOLOGY STUDY AND ABLATION     COLONOSCOPY  04/07/2007   Small colonic polyp status post polypectomy. Pancolonic diverticulosis predominantly in the sigmoid colon. Internal hemorrhoids.    ESOPHAGOGASTRODUODENOSCOPY  06/15/2010   Erosive esophagitis. Status post PEG placment   EYE SURGERY     INGUINAL HERNIA REPAIR      PROSTATE BIOPSY     SEBACEOUS CYST REMOVAL     TONSILLECTOMY     Family History:  Family History  Problem Relation Age of Onset   Other Mother        brain tumor - unsure if it was cancer   Heart disease Father    Prostate cancer Father    Stomach cancer Father    Colon cancer Neg Hx    Esophageal cancer Neg Hx    Rectal cancer Neg Hx    Family Psychiatric  History: Unremarkable Social History:  Social History   Substance and Sexual Activity  Alcohol Use Not Currently     Social History   Substance and Sexual Activity  Drug Use Never    Social History   Socioeconomic History   Marital status: Married    Spouse name: Not on file   Number of children: 3   Years of education: 10th grade   Highest education level: Not on file  Occupational History   Occupation: Retired  Tobacco Use   Smoking status: Former    Types: Cigarettes    Quit date: 2011    Years since quitting: 13.1   Smokeless tobacco: Former  Scientific laboratory technician Use: Never used  Substance and Sexual Activity   Alcohol use: Not Currently   Drug use: Never   Sexual activity: Not Currently  Other Topics Concern   Not on file  Social History Narrative   Lives at home with wife.   Right-handed.   2 cups of caffeine per day.   Social Determinants of Health   Financial Resource Strain: Not on file  Food Insecurity: No Food Insecurity (08/02/2022)   Hunger Vital Sign    Worried About Running Out of Food in the Last Year: Never true    Ran Out of Food in the Last Year: Never true  Transportation Needs: No Transportation Needs (08/02/2022)   PRAPARE - Hydrologist (Medical): No    Lack of Transportation (Non-Medical): No  Physical Activity: Not on file  Stress: Not on file  Social Connections: Not on file   Additional Social History:                         Sleep: Good  Appetite:  Good  Current Medications: Current Facility-Administered Medications   Medication Dose Route Frequency Provider Last Rate Last Admin   acetaminophen (TYLENOL) tablet 650 mg  650 mg Oral Q6H PRN Clapacs, Clebert T, MD   650 mg at 08/08/22 0957   alum & mag hydroxide-simeth (MAALOX/MYLANTA) 200-200-20 MG/5ML suspension 30 mL  30 mL Oral Q4H PRN Clapacs, Madie Reno, MD       amiodarone (PACERONE) tablet 200 mg  200 mg Oral Daily Clapacs, Sylas T, MD   200 mg at 08/08/22 0957   apixaban (ELIQUIS) tablet 5 mg  5 mg Oral BID Clapacs, Madie Reno, MD   5 mg at 08/08/22 [id number]   cholecalciferol (VITAMIN D3) 25 MCG (1000 UNIT) tablet 5,000 Units  5,000 Units Oral Daily Clapacs, Madie Reno, MD  5,000 Units at 08/08/22 0958   famotidine (PEPCID) tablet 20 mg  20 mg Oral Daily Clapacs, Quandre T, MD   20 mg at 08/08/22 [id number]   labetalol (NORMODYNE) injection 10 mg  10 mg Intravenous Q2H PRN Clapacs, Madie Reno, MD       lamoTRIgine (LAMICTAL) tablet 100 mg  100 mg Oral QHS Clapacs, Jahlen T, MD   100 mg at 08/07/22 2132   levothyroxine (SYNTHROID) tablet 125 mcg  125 mcg Oral Daily Clapacs, Madie Reno, MD   125 mcg at 08/07/22 0923   lidocaine (LIDODERM) 5 % 2 patch  2 patch Transdermal Q24H Clapacs, Jaxx T, MD   2 patch at 08/08/22 1245   LORazepam (ATIVAN) tablet 0.5 mg  0.5 mg Oral Q6H PRN Parks Ranger, DO   0.5 mg at 08/08/22 [id number]   magnesium hydroxide (MILK OF MAGNESIA) suspension 30 mL  30 mL Oral Daily PRN Clapacs, Farhan T, MD   30 mL at 08/04/22 0941   metoprolol tartrate (LOPRESSOR) tablet 50 mg  50 mg Oral BID Clapacs, Fred T, MD   50 mg at 08/07/22 [id number]   oxybutynin (DITROPAN) tablet 2.5 mg  2.5 mg Oral TID Parks Ranger, DO   2.5 mg at 08/08/22 1000   pantoprazole (PROTONIX) EC tablet 40 mg  40 mg Oral Daily Clapacs, Trashawn T, MD   40 mg at 08/08/22 0957   polyethylene glycol (MIRALAX / GLYCOLAX) packet 17 g  17 g Oral Daily PRN Clapacs, Bhavesh T, MD   17 g at 08/03/22 [id number]   QUEtiapine (SEROQUEL XR) 24 hr tablet 200 mg  200 mg Oral QHS Clapacs, Edwin T, MD   200 mg at 08/07/22 2131    risperiDONE (RISPERDAL M-TABS) disintegrating tablet 2 mg  2 mg Oral Q8H PRN Clapacs, Adorian T, MD       rosuvastatin (CRESTOR) tablet 10 mg  10 mg Oral QPM Clapacs, Aydrien T, MD   10 mg at 08/07/22 1743   thiamine (VITAMIN B1) tablet 100 mg  100 mg Oral Daily Clapacs, Madie Reno, MD   100 mg at 08/08/22 [id number]    Lab Results: No results found for this or any previous visit (from the past 48 hour(s)).  Blood Alcohol level:  Lab Results  Component Value Date   ETH <10 123456    Metabolic Disorder Labs: Lab Results  Component Value Date   HGBA1C 5.6 07/27/2022   MPG 114.02 07/27/2022   No results found for: "PROLACTIN" Lab Results  Component Value Date   CHOL 104 07/28/2022   TRIG 128 07/28/2022   HDL 38 (L) 07/28/2022   CHOLHDL 2.7 07/28/2022   VLDL 26 07/28/2022   LDLCALC 40 07/28/2022   LDLCALC 86 07/25/2021    Physical Findings: AIMS:  , ,  ,  ,    CIWA:    COWS:     Musculoskeletal: Strength & Muscle Tone: within normal limits Gait & Station: normal Patient leans: N/A  Psychiatric Specialty Exam:  Presentation  General Appearance:  Appropriate for Environment; Casual  Eye Contact: Good  Speech: Clear and Coherent; Normal Rate  Speech Volume: Normal  Handedness:No data recorded  Mood and Affect  Mood: Euthymic  Affect: Appropriate; Congruent   Thought Process  Thought Processes: Coherent  Descriptions of Associations:Intact  Orientation:Full (Time, Place and Person)  Thought Content:WDL  History of Schizophrenia/Schizoaffective disorder:No data recorded Duration of Psychotic Symptoms:No data recorded Hallucinations:No data recorded Ideas of Reference:None  Suicidal Thoughts:No data recorded  Homicidal Thoughts:No data recorded  Sensorium  Memory: Immediate Fair; Recent Fair  Judgment: Fair  Insight: Fair; Shallow   Executive Functions  Concentration: Good  Attention Span: Good  Recall: [place] of  Knowledge: Good  Language: Good   Psychomotor Activity  Psychomotor Activity:No data recorded  Assets  Assets: Desire for Improvement; Housing; Social Support; Talents/Skills   Sleep  Sleep:No data recorded   Physical Exam: Physical Exam Vitals and nursing note reviewed.  Constitutional:      Appearance: Normal appearance. He is normal weight.  Neurological:     General: No focal deficit present.     Mental Status: He is alert and oriented to person, place, and time.  Psychiatric:        Attention and Perception: Attention and perception normal.        Mood and Affect: Mood and affect normal.        Speech: Speech normal.        Behavior: Behavior normal. Behavior is cooperative.        Thought Content: Thought content normal.        Cognition and Memory: Cognition and memory normal.        Judgment: Judgment normal.    Review of Systems  Constitutional: Negative.   HENT: Negative.    Eyes: Negative.   Respiratory: Negative.    Cardiovascular: Negative.   Gastrointestinal: Negative.   Genitourinary: Negative.   Musculoskeletal: Negative.   Skin: Negative.   Neurological: Negative.   Endo/Heme/Allergies: Negative.   Psychiatric/Behavioral: Negative.     Blood pressure (!) 108/52, pulse (!) 56, temperature 98.1 F (36.7 C), temperature source Oral, resp. rate 16, height '5\' 7"'$  (1.702 m), weight 89 kg, SpO2 96 %. Body mass index is 30.73 kg/m.   Treatment Plan Summary: Daily contact with patient to assess and evaluate symptoms and progress in treatment, Medication management, and Plan continue current medications.  [place], DO 08/08/2022, 1:22 PM

## 2022-08-08 NOTE — BH IP Treatment Plan (Signed)
Interdisciplinary Treatment and Diagnostic Plan Update  08/08/2022 Time of Session: 9:30AM [name] MRN: [id number]  Principal Diagnosis: Bipolar affective disorder, depressed, severe ([place])  Secondary Diagnoses: Principal Problem:   Bipolar affective disorder, depressed, severe ([place]) Active Problems:   Dementia without behavioral disturbance ([place])   Current Medications:  Current Facility-Administered Medications  Medication Dose Route Frequency Provider Last Rate Last Admin   acetaminophen (TYLENOL) tablet 650 mg  650 mg Oral Q6H PRN Clapacs, Legrande T, MD   650 mg at 08/07/22 2131   alum & mag hydroxide-simeth (MAALOX/MYLANTA) 200-200-20 MG/5ML suspension 30 mL  30 mL Oral Q4H PRN Clapacs, Madie Reno, MD       amiodarone (PACERONE) tablet 200 mg  200 mg Oral Daily Clapacs, Fawaz T, MD   200 mg at 08/07/22 [id number]   apixaban (ELIQUIS) tablet 5 mg  5 mg Oral BID Clapacs, Madie Reno, MD   5 mg at 08/07/22 2132   cholecalciferol (VITAMIN D3) 25 MCG (1000 UNIT) tablet 5,000 Units  5,000 Units Oral Daily Clapacs, Madie Reno, MD   5,000 Units at 08/07/22 0921   famotidine (PEPCID) tablet 20 mg  20 mg Oral Daily Clapacs, Madie Reno, MD   20 mg at 08/07/22 [id number]   labetalol (NORMODYNE) injection 10 mg  10 mg Intravenous Q2H PRN Clapacs, Madie Reno, MD       lamoTRIgine (LAMICTAL) tablet 100 mg  100 mg Oral QHS Clapacs, Winter T, MD   100 mg at 08/07/22 2132   levothyroxine (SYNTHROID) tablet 125 mcg  125 mcg Oral Daily Clapacs, Madie Reno, MD   125 mcg at 08/07/22 0923   lidocaine (LIDODERM) 5 % 2 patch  2 patch Transdermal Q24H Clapacs, Madie Reno, MD   2 patch at 08/06/22 1017   LORazepam (ATIVAN) tablet 0.5 mg  0.5 mg Oral Q6H PRN Parks Ranger, DO   0.5 mg at 08/07/22 2132   magnesium hydroxide (MILK OF MAGNESIA) suspension 30 mL  30 mL Oral Daily PRN Clapacs, Wilkins T, MD   30 mL at 08/04/22 0941   metoprolol tartrate (LOPRESSOR) tablet 50 mg  50 mg Oral BID Clapacs, Madie Reno, MD   50 mg at 08/07/22 [id number]    oxybutynin (DITROPAN) tablet 2.5 mg  2.5 mg Oral TID Parks Ranger, DO   2.5 mg at 08/07/22 2130   pantoprazole (PROTONIX) EC tablet 40 mg  40 mg Oral Daily Clapacs, Madie Reno, MD   40 mg at 08/07/22 [id number]   polyethylene glycol (MIRALAX / GLYCOLAX) packet 17 g  17 g Oral Daily PRN Clapacs, Madie Reno, MD   17 g at 08/03/22 [id number]   QUEtiapine (SEROQUEL XR) 24 hr tablet 200 mg  200 mg Oral QHS Clapacs, Elam T, MD   200 mg at 08/07/22 2131   risperiDONE (RISPERDAL M-TABS) disintegrating tablet 2 mg  2 mg Oral Q8H PRN Clapacs, Madie Reno, MD       rosuvastatin (CRESTOR) tablet 10 mg  10 mg Oral QPM Clapacs, Madie Reno, MD   10 mg at 08/07/22 1743   thiamine (VITAMIN B1) tablet 100 mg  100 mg Oral Daily Clapacs, Madie Reno, MD   100 mg at 08/07/22 [id number]   PTA Medications: Medications Prior to Admission  Medication Sig Dispense Refill Last Dose   amiodarone (PACERONE) 200 MG tablet Take 1 tablet (200 mg total) by mouth 2 (two) times daily for 7 days, THEN 1 tablet (200 mg total) daily. (Patient taking differently: 200 mg  once daily) 44 tablet 0    apixaban (ELIQUIS) 5 MG TABS tablet Take 1 tablet (5 mg total) by mouth 2 (two) times daily. 60 tablet 0    lamoTRIgine (LAMICTAL) 100 MG tablet Take 100 mg by mouth at bedtime.      levothyroxine (SYNTHROID) 125 MCG tablet Take 125 mcg by mouth daily.      LORazepam (ATIVAN) 1 MG tablet Take 1 mg by mouth 2 (two) times daily.      metoprolol tartrate (LOPRESSOR) 50 MG tablet Take 1 tablet (50 mg total) by mouth 2 (two) times daily. 180 tablet 3    omeprazole (PRILOSEC OTC) 20 MG tablet Take 20 mg by mouth 2 (two) times daily.      polyethylene glycol (MIRALAX / GLYCOLAX) 17 g packet Take 17 g by mouth daily as needed for constipation.      QUEtiapine (SEROQUEL XR) 200 MG 24 hr tablet Take 200 mg by mouth at bedtime.      rosuvastatin (CRESTOR) 10 MG tablet Take 10 mg by mouth every evening.       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   Physician Treatment Plan for Primary Diagnosis: Bipolar affective disorder, depressed, severe ([place]) Long Term Goal(s): Improvement in symptoms so as ready for discharge   Short Term Goals: Ability to identify changes in lifestyle to reduce recurrence of condition will improve Ability to verbalize feelings will improve Ability to disclose and discuss suicidal ideas Ability to demonstrate self-control will improve Ability to identify and develop effective coping behaviors will improve Ability to maintain clinical measurements within normal limits will improve Compliance with prescribed medications will improve Ability to identify triggers associated with substance abuse/mental health issues will improve  Medication Management: Evaluate patient's response, side effects, and tolerance of medication regimen.  Therapeutic Interventions: 1 to 1 sessions, Unit Group sessions and Medication administration.  Evaluation of Outcomes: Progressing  Physician Treatment Plan for Secondary Diagnosis: Principal Problem:   Bipolar affective disorder, depressed, severe ([place]) Active Problems:   Dementia without behavioral disturbance ([place])  Long Term Goal(s): Improvement in symptoms so as ready for discharge   Short Term Goals: Ability to identify changes in lifestyle to reduce recurrence of condition will improve Ability to verbalize feelings will improve Ability to disclose and discuss suicidal ideas Ability to demonstrate self-control will improve Ability to identify and develop effective coping behaviors will improve Ability to maintain clinical measurements within normal limits will improve Compliance with prescribed medications will improve Ability to identify triggers associated with substance  abuse/mental health issues will improve     Medication Management: Evaluate patient's response, side effects, and tolerance of medication regimen.  Therapeutic Interventions: 1 to 1 sessions, Unit Group sessions and Medication administration.  Evaluation of Outcomes: Progressing   RN Treatment Plan for Primary Diagnosis: Bipolar affective disorder, depressed, severe ([place]) Long Term Goal(s): Knowledge of disease and therapeutic regimen to maintain health will improve  Short Term Goals: Ability to remain free from injury will improve, Ability to verbalize frustration and anger appropriately will improve, Ability to demonstrate self-control, Ability to participate in decision making will improve, Ability to verbalize feelings will improve, Ability to identify and develop effective coping behaviors will improve, and Compliance with prescribed medications will improve  Medication Management: RN will administer medications as ordered by provider,  will assess and evaluate patient's response and provide education to patient for prescribed medication. RN will report any adverse and/or side effects to prescribing provider.  Therapeutic Interventions: 1 on 1 counseling sessions, Psychoeducation, Medication administration, Evaluate responses to treatment, Monitor vital signs and CBGs as ordered, Perform/monitor CIWA, COWS, AIMS and Fall Risk screenings as ordered, Perform wound care treatments as ordered.  Evaluation of Outcomes: Progressing   LCSW Treatment Plan for Primary Diagnosis: Bipolar affective disorder, depressed, severe ([place]) Long Term Goal(s): Safe transition to appropriate next level of care at discharge, Engage patient in therapeutic group addressing interpersonal concerns.  Short Term Goals: Engage patient in aftercare planning with referrals and resources, Increase social support, Increase ability to appropriately verbalize feelings, Increase emotional regulation, Facilitate acceptance of  mental health diagnosis and concerns, and Increase skills for wellness and recovery  Therapeutic Interventions: Assess for all discharge needs, 1 to 1 time with Social worker, Explore available resources and support systems, Assess for adequacy in community support network, Educate family and significant other(s) on suicide prevention, Complete Psychosocial Assessment, Interpersonal group therapy.  Evaluation of Outcomes: Progressing   Progress in Treatment: Attending groups: Yes. Participating in groups: Yes. Taking medication as prescribed: Yes. Toleration medication: Yes. Family/Significant other contact made: Yes, individual(s) contacted:  SPE completed with pt's spouse, [place] Patient understands diagnosis: No. Discussing patient identified problems/goals with staff: Yes. Medical problems stabilized or resolved: Yes. Denies suicidal/homicidal ideation: Yes. Issues/concerns per patient self-inventory: No. Other: None  New problem(s) identified: No, Describe:  None  New Short Term/Long Term Goal(s): Patient to work towards medication management for mood stabilization; development of comprehensive mental wellness plan. Update 08/08/22: No changes at this time.      Patient Goals:  "I have anxiety and can't sleep because of my bladder issues" Update 08/08/22: No changes at this time.    Discharge Plan or Barriers: CSW will assist pt with development of appropriate discharge/aftercare plan. Update 08/08/22: No changes at this time.      Reason for Continuation of Hospitalization: Anxiety Depression Medical Issues Medication stabilization   Estimated Length of Stay: 1-7 days  Last [place] Suicide Severity Risk Score: Flowsheet Row Admission (Current) from 08/02/2022 in [facility] ED to Hosp-Admission (Discharged) from 07/05/2022 in [facility] No Risk No Risk       Last PHQ 2/9 Scores:    10/31/2021     2:41 PM 10/31/2021    2:25 PM 01/19/2020    1:58 PM  Depression screen PHQ 2/9  Decreased Interest 0 0 0  Down, Depressed, Hopeless 0 0 0  PHQ - 2 Score 0 0 0    Scribe for Treatment Team: Anabel Lykins A [place], LCSWA 08/08/2022 9:31 AM

## 2022-08-08 NOTE — Progress Notes (Signed)
   08/08/22 1000  Psych Admission Type (Psych Patients Only)  Admission Status Involuntary  Psychosocial Assessment  Patient Complaints Worrying  Eye Contact Fair  Facial Expression Worried  Affect Appropriate to circumstance  Speech Logical/coherent  Interaction Assertive  Motor Activity Slow  Appearance/Hygiene Unremarkable  Behavior Characteristics Cooperative  Mood Anxious  Thought Process  Coherency WDL  Content WDL  Delusions None reported or observed  Perception WDL  Hallucination None reported or observed  Judgment Impaired  Confusion None  Danger to Self  Current suicidal ideation? Denies  Danger to Others  Danger to Others None reported or observed

## 2022-08-08 NOTE — BH Assessment (Signed)
1910 Received patient sitting in the day room interacting with a male peer. He is alert and appear to be enjoying the interaction.  2020 Patient received a phone call from a friend, he did not stay on the phone very long but it appeared to be a positive exchange. His BP is elevated and he did not received the AM dose of Lopressor. PM dose of lopressor will be given early and BP will be rechecked.

## 2022-08-08 NOTE — Group Note (Signed)
Recreation Therapy Group Note   Group Topic:General Recreation  Group Date: 08/08/2022 Start Time: 1400 End Time: 1445 Facilitators: Vilma Prader, LRT, CTRS Location:  Dayroom  Group Description: Trivia. Patients are split into two groups. LRT reads off trivia question for one team to answer within allotted time. If the first team is unable to answer or answers incorrectly, then the opposite team gets a chance at answering the question. At the end of the game, whichever team answers the most question correctly, wins. LRT facilitated post-game discussion on the importance of working well with others, active listening to others and being a part of a team. LRT and pts discussed how this can apply to life post-discharge.   Affect/Mood: Appropriate   Participation Level: Moderate and Engaged   Participation Quality: Independent   Behavior: Appropriate, Calm, and Cooperative   Speech/Thought Process: Coherent   Insight: Fair   Judgement: Good   Modes of Intervention: Activity   Patient Response to Interventions:  Interested  and Receptive   Education Outcome:  Acknowledges education   Clinical Observations/Individualized Feedback: [name] was mostly active in their participation of session activities and group discussion. Pt was noticeably more interested in the "[place]" theme questions in comparison to others. Pt was pleasant and talked amongst peers duration of group.    Plan: Continue to engage patient in RT group sessions 2-3x/week.   Vilma Prader, LRT, [place] 08/08/2022 2:53 PM

## 2022-08-08 NOTE — BHH Group Notes (Signed)
[facility] Group Notes:  (Nursing/MHT/Case Management/Adjunct)  Date:  08/08/2022  Time:  9:36 AM  Type of Therapy:  Group Therapy  Participation Level:  Active  Participation Quality:  Appropriate  Affect:  Appropriate  Cognitive:  Alert, Appropriate, and Oriented  Insight:  Appropriate  Engagement in Group:  Engaged  Modes of Intervention:  Discussion  Summary of Progress/Problems: we had a discussion about what we were grateful/thankful for and he participated and answered every question.  Hosie Spangle 08/08/2022, 9:36 AM

## 2022-08-08 NOTE — Progress Notes (Signed)
D: Patient alert and oriented times 3. Pt denies SI, HI, AVH. Pt rates pain  6/10- PRN Tylenol given. Pt endorses anxiety and depression. No other concerns noted at this time.   A: Pt provided support and encouragement throughout the day. Scheduled medications given as prescribed. Takes medications whole without issue.    R: Pt remains safe on the unit with Q15 min safety checks in place. Will continue to monitor for changes.

## 2022-08-08 NOTE — Group Note (Signed)
Date:  08/08/2022 Time:  10:14 AM  Group Topic/Focus:  Personal Choices and Values:   The focus of this group is to help patients assess and explore the importance of values in their lives, how their values affect their decisions, how they express their values and what opposes their expression.    Participation Level:  Active  Participation Quality:  Appropriate and Attentive  Affect:  Appropriate  Cognitive:  Alert, Appropriate, and Oriented  Insight: Appropriate  Engagement in Group:  Engaged  Modes of Intervention:  Discussion  Additional Comments:    Ladona Mow 08/08/2022, 10:14 AM

## 2022-08-09 NOTE — Progress Notes (Signed)
Patient is A+O x 3. He denies SI/HI/AVH. Denies pain although is prescribed daily Lidocaine patches. Denies anxiety and depression. Medication compliant.  Patient is scheduled to discharge tomorrow 03.01.24.  At approx 1830, BP assessed SBP 185 in the R arm and 172 in the L arm. MD Clapacs notifed and no new orders at this time. Patient later stated that he was experiencing anxiety. 0.5 mg Ativan adm at 1900. Information passed onto OCN.  Q15 minute unit checks in place.

## 2022-08-09 NOTE — Plan of Care (Signed)
  Problem: Activity: Goal: Risk for activity intolerance will decrease Outcome: Progressing   Problem: Nutrition: Goal: Adequate nutrition will be maintained Outcome: Progressing   Problem: Safety: Goal: Ability to remain free from injury will improve Outcome: Progressing   

## 2022-08-09 NOTE — Group Note (Deleted)
Recreation Therapy Group Note   Group Topic:Problem Solving  Group Date: 08/09/2022 Start Time: 1400 End Time: 1455 Facilitators: Vilma Prader, LRT Location:  Dayroom       Affect/Mood: {RT BHH Affect/Mood:26271}   Participation Level: {RT BHH Participation Level:26267}   Participation Quality: {RT BHH Participation Quality:26268}   Behavior: {RT BHH Group Behavior:26269}   Speech/Thought Process: {RT BHH Speech/Thought:26276}   Insight: {RT BHH Insight:26272}   Judgement: {RT BHH Judgement:26278}   Modes of Intervention: {RT BHH Modes of Intervention:26277}   Patient Response to Interventions:  {RT BHH Patient Response to Intervention:26274}   Education Outcome:  {RT [place] Education Outcome:26279}   Clinical Observations/Individualized Feedback: *** was *** in their participation of session activities and group discussion. Pt identified ***   Plan: {RT BHH Tx [id number]   Vilma Prader, LRT,  08/09/2022 1:37 PM

## 2022-08-09 NOTE — Progress Notes (Signed)
[facility] MD Progress Note  08/09/2022 11:55 AM [name]  MRN:  [id number] Subjective: [name] is seen on rounds.  He says that he is very happy with me and the medications that he is on.  His mood and affect are stable.  Social work spoke to his wife.  Patient states that she is going to pick him up tomorrow.  Need to figure out his follow-up. Principal Problem: Bipolar affective disorder, depressed, severe ([place]) Diagnosis: Principal Problem:   Bipolar affective disorder, depressed, severe ([place]) Active Problems:   Dementia without behavioral disturbance ([place])  Total Time spent with patient: 15 minutes  Past Psychiatric History: History of bipolar disorder and ECT.  Past Medical History:  Past Medical History:  Diagnosis Date   Acquired hypothyroidism [id number]   Acute metabolic encephalopathy [id number]   Acute MI, true posterior wall, subsequent episode of care ([place]) 08/15/2014   EF 44% with mildly reduced LV function  Formatting of this note might be different from the original. EF 44% with mildly reduced LV function   AKI (acute kidney injury) ([place]) 09/20/2014   Anxiety    Aspiration pneumonia ([place]) 07/05/2022   Atrial fibrillation ([place]) 09/28/2019   ablation in past  Formatting of this note might be different from the original. ablation in past   Bipolar disorder [facility])    w/dementia/psychotic episodes   Bradycardia 09/17/2014   Cancer ([address]) 09/28/2019   Prostate Cancer; Throat Cancer  Formatting of this note might be different from the original. Prostate Cancer; Throat Cancer   Cardiac murmur 09/28/2019   Cataract    Cervical radicular pain 07/16/2019   Cervical spondylosis 06/19/2019   Chest pain with high risk for cardiac etiology 08/19/2014   Chronic depression    Chronic pain due to trauma 07/16/2019   Coffee ground emesis 07/07/2022   Confusion 07/07/2022   Corn of toe 12/11/2018   Coronary artery calcification 07/25/2021   Coronary artery disease 08/10/2014    Degenerative disc disease, cervical 09/29/2018   Degenerative lumbar disc    Dementia without behavioral disturbance ([place]) 07/16/2019   Disease characterized by destruction of skeletal muscle 08/15/2014   Essential hypertension 09/28/2019   Essential tremor 09/21/2014   History of MI (myocardial infarction) 09/14/2014   Hypercholesteremia    Hypertension    Hypotension 09/14/2014   Long term (current) use of anticoagulants 11/14/2021   Malignant neoplasm of prostate ([place])    Malnutrition of moderate degree [id number]   Metabolic encephalopathy [id number]   Mild aortic stenosis 11/26/2019   Mood change 10/12/2019   Myocardial infarction ([place]) 08/10/2014   Nausea and vomiting 08/19/2014   Onychomycosis of left great toe 08/21/2014   Pain of fifth toe 12/11/2018   Paroxysmal atrial fibrillation ([place]) 07/25/2021   Peptic esophagitis    Peripheral neuropathy    Peripheral neuropathy due to chemotherapy ([place]) 09/21/2014   Prolonged Q-T interval on ECG 08/19/2014   Prostate cancer ([place]) 09/28/2019   Reactive depression 10/12/2019   Renal insufficiency 08/17/2014   Right bundle branch block 09/28/2019   Sepsis ([place]) 05/18/2020   Small fiber neuropathy 12/16/2018   Squamous cell carcinoma of left tonsil (HCC)    Stage 3b chronic kidney disease ([place]) 12/20/2021   Throat cancer ([place])    Thyroid disease    Transaminitis 08/17/2014   Unstable angina ([place]) 09/14/2014   Whiplash injury syndrome, sequela 08/11/2018    Past Surgical History:  Procedure Laterality Date   CARDIAC ELECTROPHYSIOLOGY STUDY AND ABLATION  COLONOSCOPY  04/07/2007   Small colonic polyp status post polypectomy. Pancolonic diverticulosis predominantly in the sigmoid colon. Internal hemorrhoids.    ESOPHAGOGASTRODUODENOSCOPY  06/15/2010   Erosive esophagitis. Status post PEG placment   EYE SURGERY     INGUINAL HERNIA REPAIR     PROSTATE BIOPSY     SEBACEOUS CYST REMOVAL     TONSILLECTOMY     Family  History:  Family History  Problem Relation Age of Onset   Other Mother        brain tumor - unsure if it was cancer   Heart disease Father    Prostate cancer Father    Stomach cancer Father    Colon cancer Neg Hx    Esophageal cancer Neg Hx    Rectal cancer Neg Hx    Family Psychiatric  History: Unremarkable Social History:  Social History   Substance and Sexual Activity  Alcohol Use Not Currently     Social History   Substance and Sexual Activity  Drug Use Never    Social History   Socioeconomic History   Marital status: Married    Spouse name: Not on file   Number of children: 3   Years of education: 10th grade   Highest education level: Not on file  Occupational History   Occupation: Retired  Tobacco Use   Smoking status: Former    Types: Cigarettes    Quit date: 2011    Years since quitting: 13.1   Smokeless tobacco: Former  Scientific laboratory technician Use: Never used  Substance and Sexual Activity   Alcohol use: Not Currently   Drug use: Never   Sexual activity: Not Currently  Other Topics Concern   Not on file  Social History Narrative   Lives at home with wife.   Right-handed.   2 cups of caffeine per day.   Social Determinants of Health   Financial Resource Strain: Not on file  Food Insecurity: No Food Insecurity (08/02/2022)   Hunger Vital Sign    Worried About Running Out of Food in the Last Year: Never true    Ran Out of Food in the Last Year: Never true  Transportation Needs: No Transportation Needs (08/02/2022)   PRAPARE - Hydrologist (Medical): No    Lack of Transportation (Non-Medical): No  Physical Activity: Not on file  Stress: Not on file  Social Connections: Not on file   Additional Social History:                         Sleep: Good  Appetite:  Good  Current Medications: Current Facility-Administered Medications  Medication Dose Route Frequency Provider Last Rate Last Admin   acetaminophen  (TYLENOL) tablet 650 mg  650 mg Oral Q6H PRN Clapacs, Lorance T, MD   650 mg at 08/08/22 0957   alum & mag hydroxide-simeth (MAALOX/MYLANTA) 200-200-20 MG/5ML suspension 30 mL  30 mL Oral Q4H PRN Clapacs, Madie Reno, MD       amiodarone (PACERONE) tablet 200 mg  200 mg Oral Daily Clapacs, Mariel T, MD   200 mg at 08/09/22 1006   apixaban (ELIQUIS) tablet 5 mg  5 mg Oral BID Clapacs, Khai T, MD   5 mg at 08/09/22 1006   cholecalciferol (VITAMIN D3) 25 MCG (1000 UNIT) tablet 5,000 Units  5,000 Units Oral Daily Clapacs, Madie Reno, MD   5,000 Units at 08/09/22 1005   famotidine (PEPCID) tablet 20  mg  20 mg Oral Daily Clapacs, Guerry T, MD   20 mg at 08/09/22 1006   labetalol (NORMODYNE) injection 10 mg  10 mg Intravenous Q2H PRN Clapacs, Madie Reno, MD       lamoTRIgine (LAMICTAL) tablet 100 mg  100 mg Oral QHS Clapacs, Corbitt T, MD   100 mg at 08/08/22 2212   levothyroxine (SYNTHROID) tablet 125 mcg  125 mcg Oral Daily Clapacs, Madie Reno, MD   125 mcg at 08/09/22 [id number]   lidocaine (LIDODERM) 5 % 2 patch  2 patch Transdermal Q24H Clapacs, Tilton T, MD   2 patch at 08/08/22 1245   LORazepam (ATIVAN) tablet 0.5 mg  0.5 mg Oral Q6H PRN Parks Ranger, DO   0.5 mg at 08/08/22 [id number]   magnesium hydroxide (MILK OF MAGNESIA) suspension 30 mL  30 mL Oral Daily PRN Clapacs, Rhodes T, MD   30 mL at 08/04/22 0941   metoprolol tartrate (LOPRESSOR) tablet 50 mg  50 mg Oral BID Clapacs, Raj T, MD   50 mg at 08/09/22 1006   oxybutynin (DITROPAN) tablet 2.5 mg  2.5 mg Oral TID Parks Ranger, DO   2.5 mg at 08/09/22 1006   pantoprazole (PROTONIX) EC tablet 40 mg  40 mg Oral Daily Clapacs, Kharter T, MD   40 mg at 08/09/22 1006   polyethylene glycol (MIRALAX / GLYCOLAX) packet 17 g  17 g Oral Daily PRN Clapacs, Trayson T, MD   17 g at 08/03/22 [id number]   QUEtiapine (SEROQUEL XR) 24 hr tablet 200 mg  200 mg Oral QHS Clapacs, Trayvion T, MD   200 mg at 08/08/22 2212   risperiDONE (RISPERDAL M-TABS) disintegrating tablet 2 mg  2 mg Oral Q8H PRN  Clapacs, Nahuel T, MD       rosuvastatin (CRESTOR) tablet 10 mg  10 mg Oral QPM Clapacs, Simcha T, MD   10 mg at 08/08/22 1659   thiamine (VITAMIN B1) tablet 100 mg  100 mg Oral Daily Clapacs, Deovion T, MD   100 mg at 08/09/22 1006    Lab Results: No results found for this or any previous visit (from the past 48 hour(s)).  Blood Alcohol level:  Lab Results  Component Value Date   ETH <10 123456    Metabolic Disorder Labs: Lab Results  Component Value Date   HGBA1C 5.6 07/27/2022   MPG 114.02 07/27/2022   No results found for: "PROLACTIN" Lab Results  Component Value Date   CHOL 104 07/28/2022   TRIG 128 07/28/2022   HDL 38 (L) 07/28/2022   CHOLHDL 2.7 07/28/2022   VLDL 26 07/28/2022   LDLCALC 40 07/28/2022   LDLCALC 86 07/25/2021    Physical Findings: AIMS:  , ,  ,  ,    CIWA:    COWS:     Musculoskeletal: Strength & Muscle Tone: within normal limits Gait & Station: normal Patient leans: N/A  Psychiatric Specialty Exam:  Presentation  General Appearance:  Appropriate for Environment; Casual  Eye Contact: Good  Speech: Clear and Coherent; Normal Rate  Speech Volume: Normal  Handedness:No data recorded  Mood and Affect  Mood: Euthymic  Affect: Appropriate; Congruent   Thought Process  Thought Processes: Coherent  Descriptions of Associations:Intact  Orientation:Full (Time, Place and Person)  Thought Content:WDL  History of Schizophrenia/Schizoaffective disorder:No data recorded Duration of Psychotic Symptoms:No data recorded Hallucinations:No data recorded Ideas of Reference:None  Suicidal Thoughts:No data recorded Homicidal Thoughts:No data recorded  Sensorium  Memory: Immediate Fair; Recent  Fair  Judgment: Fair  Insight: Fair; Shallow   Executive Functions  Concentration: Good  Attention Span: Good  Recall: [place] of Knowledge: Good  Language: Good   Psychomotor Activity  Psychomotor Activity:No data  recorded  Assets  Assets: Desire for Improvement; Housing; Social Support; Talents/Skills   Sleep  Sleep:No data recorded   Physical Exam: Physical Exam Vitals and nursing note reviewed.  Constitutional:      Appearance: Normal appearance. He is normal weight.  Neurological:     General: No focal deficit present.     Mental Status: He is alert and oriented to person, place, and time.  Psychiatric:        Attention and Perception: Attention and perception normal.        Mood and Affect: Mood and affect normal.        Speech: Speech normal.        Behavior: Behavior normal. Behavior is cooperative.        Thought Content: Thought content normal.        Cognition and Memory: Cognition and memory normal.        Judgment: Judgment normal.    Review of Systems  Constitutional: Negative.   HENT: Negative.    Eyes: Negative.   Respiratory: Negative.    Cardiovascular: Negative.   Gastrointestinal: Negative.   Genitourinary: Negative.   Musculoskeletal: Negative.   Skin: Negative.   Neurological: Negative.   Endo/Heme/Allergies: Negative.   Psychiatric/Behavioral: Negative.     Blood pressure (!) 151/79, pulse 62, temperature 98.6 F (37 C), temperature source Oral, resp. rate 18, height '5\' 7"'$  (1.702 m), weight 89 kg, SpO2 99 %. Body mass index is 30.73 kg/m.   Treatment Plan Summary: Daily contact with patient to assess and evaluate symptoms and progress in treatment, Medication management, and Plan continue current medications.  Parks Ranger, DO 08/09/2022, 11:55 AM

## 2022-08-09 NOTE — Group Note (Signed)
[facility] LCSW Group Therapy Note   Group Date: 08/09/2022 Start Time: [id number] End Time: 1415   Type of Therapy/Topic:  Group Therapy:  Balance in Life  Participation Level:  Minimal   Description of Group:    This group will address the concept of balance and how it feels and looks when one is unbalanced. Patients will be encouraged to process areas in their lives that are out of balance, and identify reasons for remaining unbalanced. Facilitators will guide patients utilizing problem- solving interventions to address and correct the stressor making their life unbalanced. Understanding and applying boundaries will be explored and addressed for obtaining  and maintaining a balanced life. Patients will be encouraged to explore ways to assertively make their unbalanced needs known to significant others in their lives, using other group members and facilitator for support and feedback.  Therapeutic Goals: Patient will identify two or more emotions or situations they have that consume much of in their lives. Patient will identify signs/triggers that life has become out of balance:  Patient will identify two ways to set boundaries in order to achieve balance in their lives:  Patient will demonstrate ability to communicate their needs through discussion and/or role plays  Summary of Patient Progress:    Patient was present for the entirety of the group session. Patient was an active listener and participated in the topic of discussion, provided helpful advice to others, and added nuance to topic of conversation. He showcased normal mood and said that he liked exercising and being with his family as his favorite types of self-care.    Therapeutic Modalities:   Cognitive Behavioral Therapy Solution-Focused Therapy Assertiveness Training   [name], LCSWA

## 2022-08-09 NOTE — Group Note (Signed)
Recreation Therapy Group Note   Group Topic:Problem Solving  Group Date: 08/09/2022 Start Time: 1415 End Time: 1500 Facilitators: Vilma Prader, LRT, CTRS Location:  Dayroom  Group Description: Life Boat. Patients were given the scenario that they are on a boat that is about to become shipwrecked, leaving them stranded on an [place]. They are asked to make a list of 15 different items that they want to take with them when they are stranded on the [place]. Patients are asked to rank their items from most important to least important, #1 being the most important and #15 being the least. Patients or LRT will read aloud the 15 different items to the group after. LRT facilitated post-activity processing to discuss how this activity can be used in daily life post discharge.  Affect/Mood: Appropriate and Euthymic   Participation Level: Active and Engaged   Participation Quality: Independent   Behavior: Alert and Appropriate   Speech/Thought Process: Coherent   Insight: Moderate   Judgement: Moderate   Modes of Intervention: Activity and Group work   Patient Response to Interventions:  Attentive, Engaged, Interested , and Receptive   Education Outcome:  Acknowledges education   Clinical Observations/Individualized Feedback: [name] was active in their participation of session activities and group discussion. Pt volunteered to be the Probation officer for the group. Pt shared "coffee and coffee pot" as some of the [place] Pt was pleasant and cooperative throughout.    Plan: Continue to engage patient in RT group sessions 2-3x/week.   [address] LRT, CTRS 08/09/2022 3:17 PM

## 2022-08-09 NOTE — BHH Group Notes (Signed)
[facility] Group Notes:  (Nursing/MHT/Case Management/Adjunct)  Date:  08/09/2022  Time:  4:20 PM  Type of Therapy:  Psychoeducational Skills  Participation Level:  Active  Participation Quality:  Appropriate and Attentive  Affect:  Appropriate  Cognitive:  Alert and Appropriate  Insight:  Appropriate  Engagement in Group:  Engaged  Modes of Intervention:  Activity and Discussion  Summary of Progress/Problems: We discussed how coping skills helped with their thoughts. We colored shamrocks and added  3-4 coping skills that would help them.   Hosie Spangle 08/09/2022, 4:20 PM

## 2022-08-10 MED ORDER — LORAZEPAM 0.5 MG PO TABS: 0.5000 mg | ORAL_TABLET | Freq: Four times a day (QID) | ORAL | 0 refills | Status: DC | PRN

## 2022-08-10 MED ORDER — LAMOTRIGINE 100 MG PO TABS: 100.0000 mg | ORAL_TABLET | Freq: Every day | ORAL | 3 refills | Status: DC

## 2022-08-10 MED ORDER — OXYBUTYNIN CHLORIDE 2.5 MG PO TABS: 2.5000 mg | ORAL_TABLET | Freq: Three times a day (TID) | ORAL | 3 refills | Status: DC

## 2022-08-10 MED ORDER — QUETIAPINE FUMARATE ER 200 MG PO TB24: 200.0000 mg | ORAL_TABLET | Freq: Every day | ORAL | 3 refills | Status: DC

## 2022-08-10 NOTE — Care Management Important Message (Signed)
Important Message  Patient Details  Name: Damiane Maranan MRN: [id number] Date of Birth: 27-Feb-1941   Medicare Important Message Given:  Yes     Eeshan Verbrugge A [place], [place] 08/10/2022, 8:25 AM

## 2022-08-10 NOTE — BHH Suicide Risk Assessment (Signed)
[facility] Discharge Suicide Risk Assessment   Principal Problem: Bipolar affective disorder, depressed, severe ([place]) Discharge Diagnoses: Principal Problem:   Bipolar affective disorder, depressed, severe ([place]) Active Problems:   Dementia without behavioral disturbance ([place])   Total Time spent with patient: 1 hour  Musculoskeletal: Strength & Muscle Tone: within normal limits Gait & Station: normal Patient leans: N/A  Psychiatric Specialty Exam  Presentation  General Appearance:  Appropriate for Environment; Casual  Eye Contact: Good  Speech: Clear and Coherent; Normal Rate  Speech Volume: Normal  Handedness:No data recorded  Mood and Affect  Mood: Euthymic  Duration of Depression Symptoms: No data recorded Affect: Appropriate; Congruent   Thought Process  Thought Processes: Coherent  Descriptions of Associations:Intact  Orientation:Full (Time, Place and Person)  Thought Content:WDL  History of Schizophrenia/Schizoaffective disorder:No data recorded Duration of Psychotic Symptoms:No data recorded Hallucinations:No data recorded Ideas of Reference:None  Suicidal Thoughts:No data recorded Homicidal Thoughts:No data recorded  Sensorium  Memory: Immediate Fair; Recent Fair  Judgment: Fair  Insight: Fair; Shallow   Executive Functions  Concentration: Good  Attention Span: Good  Recall: [place] of Knowledge: Good  Language: Good   Psychomotor Activity  Psychomotor Activity:No data recorded  Assets  Assets: Desire for Improvement; Housing; Social Support; Talents/Skills   Sleep  Sleep:No data recorded   Blood pressure 136/79, pulse (!) 58, temperature 97.9 F (36.6 C), resp. rate 18, height '5\' 7"'$  (1.702 m), weight 89 kg, SpO2 100 %. Body mass index is 30.73 kg/m.  Mental Status Per Nursing Assessment::   On Admission:  NA  Demographic Factors:  Male, Age 80 or older, and Caucasian  Loss Factors: NA  Historical  Factors: NA  Risk Reduction Factors:   Sense of responsibility to family, Living with another person, especially a relative, Positive social support, Positive therapeutic relationship, and Positive coping skills or problem solving skills  Continued Clinical Symptoms: None   Cognitive Features That Contribute To Risk:  None    Suicide Risk:  Minimal: No identifiable suicidal ideation.  Patients presenting with no risk factors but with morbid ruminations; may be classified as minimal risk based on the severity of the depressive symptoms   Follow-up [facility] Follow up on 08/29/2022.   Specialty: Behavioral Health Why: You have an appointment for follow up with Dr. Casimiro Needle, scheduled for Wednesday March 20th at 3pm. Please contact their office if needed to reschdule. Thanks! Contact information: [place] [phone]                Plan Of Care/Follow-up recommendations: Dr. Boyd Kerbs [employer], DO 08/10/2022, 10:20 AM

## 2022-08-10 NOTE — Progress Notes (Signed)
  [facility] Adult Case Management Discharge Plan :  Will you be returning to the same living situation after discharge:  Yes,  pt will be returning home At discharge, do you have transportation home?: Yes,  pt's wife is providing transportation Do you have the ability to pay for your medications: Yes,  pt has Medicare Part A&B  Release of information consent forms completed and in the chart;  Patient's signature needed at discharge.  Patient to Follow up at:  Follow-up Information     [facility] Follow up on 08/29/2022.   Specialty: Behavioral Health Why: You have an appointment for follow up with Dr. Casimiro Needle, scheduled for Wednesday March 20th at 3pm. Please contact their office if needed to reschdule. Thanks! Contact information: [place] [phone]                Next level of care provider has access to [place] and Suicide Prevention discussed: Yes,  SPE completed with pt's wife     Has patient been referred to the Quitline?: Patient refused referral  Patient has been referred for addiction treatment: N/A  [name], [name] 08/10/2022, 10:44 AM

## 2022-08-10 NOTE — Discharge Summary (Signed)
Physician Discharge Summary Note  Patient:  [name] is an 82 y.o., male MRN:  [id number] DOB:  25-Apr-1941 Patient phone:  [phone] (home)  Patient address:   [address] [address],  Total Time spent with patient: 1 hour  Date of Admission:  08/02/2022 Date of Discharge: 08/10/2022  Reason for Admission:  82 yo male transferred from [place] after being treated for a stroke, history of dementia.  He was staying with his brother as his wife could no longer care for him.  He was admitted to [facility] with bipolar disorder with depression who initially wanted ECT, declines at this time and reports his depression as mild with high anxiety, denies suicidal ideations.  He does have panic attacks at times.  Sleep is "not great" because of his incontinence and awakening to go to the bathroom, appetite is fair.  He would like help with his overactive bladder with multiple periods of incontinence, no other concerns noted.     Principal Problem: Bipolar affective disorder, depressed, severe ([place]) Discharge Diagnoses: Principal Problem:   Bipolar affective disorder, depressed, severe ([place]) Active Problems:   Dementia without behavioral disturbance ([place])   Past Psychiatric History: Supposedly history of bipolar disorder with ECT treatments.  No known violence.  Past Medical History:  Past Medical History:  Diagnosis Date   Acquired hypothyroidism [id number]   Acute metabolic encephalopathy [id number]   Acute MI, true posterior wall, subsequent episode of care ([place]) 08/15/2014   EF 44% with mildly reduced LV function  Formatting of this note might be different from the original. EF 44% with mildly reduced LV function   AKI (acute kidney injury) ([place]) 09/20/2014   Anxiety    Aspiration pneumonia ([place]) 07/05/2022   Atrial fibrillation ([place]) 09/28/2019   ablation in past  Formatting of this note might be different from the original. ablation in past   Bipolar  disorder [facility])    w/dementia/psychotic episodes   Bradycardia 09/17/2014   Cancer ([place]) 09/28/2019   Prostate Cancer; Throat Cancer  Formatting of this note might be different from the original. Prostate Cancer; Throat Cancer   Cardiac murmur 09/28/2019   Cataract    Cervical radicular pain 07/16/2019   Cervical spondylosis 06/19/2019   Chest pain with high risk for cardiac etiology 08/19/2014   Chronic depression    Chronic pain due to trauma 07/16/2019   Coffee ground emesis 07/07/2022   Confusion 07/07/2022   Corn of toe 12/11/2018   Coronary artery calcification 07/25/2021   Coronary artery disease 08/10/2014   Degenerative disc disease, cervical 09/29/2018   Degenerative lumbar disc    Dementia without behavioral disturbance ([place]) 07/16/2019   Disease characterized by destruction of skeletal muscle 08/15/2014   Essential hypertension 09/28/2019   Essential tremor 09/21/2014   History of MI (myocardial infarction) 09/14/2014   Hypercholesteremia    Hypertension    Hypotension 09/14/2014   Long term (current) use of anticoagulants 11/14/2021   Malignant neoplasm of prostate ([place])    Malnutrition of moderate degree [id number]   Metabolic encephalopathy [id number]   Mild aortic stenosis 11/26/2019   Mood change 10/12/2019   Myocardial infarction ([place]) 08/10/2014   Nausea and vomiting 08/19/2014   Onychomycosis of left great toe 08/21/2014   Pain of fifth toe 12/11/2018   Paroxysmal atrial fibrillation ([place]) 07/25/2021   Peptic esophagitis    Peripheral neuropathy    Peripheral neuropathy due to chemotherapy ([place]) 09/21/2014   Prolonged Q-T interval on ECG 08/19/2014  Prostate cancer ([place]) 09/28/2019   Reactive depression 10/12/2019   Renal insufficiency 08/17/2014   Right bundle branch block 09/28/2019   Sepsis ([place]) 05/18/2020   Small fiber neuropathy 12/16/2018   Squamous cell carcinoma of left tonsil (HCC)    Stage 3b chronic kidney disease ([place]) 12/20/2021    Throat cancer ([place])    Thyroid disease    Transaminitis 08/17/2014   Unstable angina ([place]) 09/14/2014   Whiplash injury syndrome, sequela 08/11/2018    Past Surgical History:  Procedure Laterality Date   CARDIAC ELECTROPHYSIOLOGY STUDY AND ABLATION     COLONOSCOPY  04/07/2007   Small colonic polyp status post polypectomy. Pancolonic diverticulosis predominantly in the sigmoid colon. Internal hemorrhoids.    ESOPHAGOGASTRODUODENOSCOPY  06/15/2010   Erosive esophagitis. Status post PEG placment   EYE SURGERY     INGUINAL HERNIA REPAIR     PROSTATE BIOPSY     SEBACEOUS CYST REMOVAL     TONSILLECTOMY     Family History:  Family History  Problem Relation Age of Onset   Other Mother        brain tumor - unsure if it was cancer   Heart disease Father    Prostate cancer Father    Stomach cancer Father    Colon cancer Neg Hx    Esophageal cancer Neg Hx    Rectal cancer Neg Hx    Family Psychiatric  History: Unremarkable Social History:  Social History   Substance and Sexual Activity  Alcohol Use Not Currently     Social History   Substance and Sexual Activity  Drug Use Never    Social History   Socioeconomic History   Marital status: Married    Spouse name: Not on file   Number of children: 3   Years of education: 10th grade   Highest education level: Not on file  Occupational History   Occupation: Retired  Tobacco Use   Smoking status: Former    Types: Cigarettes    Quit date: 2011    Years since quitting: 13.1   Smokeless tobacco: Former  Scientific laboratory technician Use: Never used  Substance and Sexual Activity   Alcohol use: Not Currently   Drug use: Never   Sexual activity: Not Currently  Other Topics Concern   Not on file  Social History Narrative   Lives at home with wife.   Right-handed.   2 cups of caffeine per day.   Social Determinants of Health   Financial Resource Strain: Not on file  Food Insecurity: No Food Insecurity (08/02/2022)   Hunger  Vital Sign    Worried About Running Out of Food in the Last Year: Never true    Ran Out of Food in the Last Year: Never true  Transportation Needs: No Transportation Needs (08/02/2022)   PRAPARE - Hydrologist (Medical): No    Lack of Transportation (Non-Medical): No  Physical Activity: Not on file  Stress: Not on file  Social Connections: Not on file    Hospital Course: Delman is a 81 year old white male who was involuntarily admitted to geriatric psychiatry on transfer from [facility] after a CVA.  He was in a rehab facility and his wife and daughter were pursuing extended placement because they felt like they could not care for him.  His brother checked him out of the facility and took him home meds when he had a stroke.  After his stroke treatment he was transferred to [facility].  While on the unit his biggest complaint was urinary incontinence especially at nighttime and I placed him on Ditropan and his mood greatly improved.  He was placed back on Lamictal and Ativan was used as needed.  Seroquel XR 200 mg at bedtime was also added and he did really well with all of his medications.  He was pleasant and cooperative throughout his hospitalization.  He never had any manic episodes and denied any depression or psychosis.  He was compliant with medications and there were no side effects.  He was very happy that his urinary incontinence was addressed.  It was felt that he maximized hospitalization and he was discharged home.  On the day of discharge he denied suicidal ideation, homicidal ideation, auditory or visual hallucinations.  His judgment and insight were good.  Physical Findings: AIMS:  , ,  ,  ,    CIWA:    COWS:     Musculoskeletal: Strength & Muscle Tone: within normal limits Gait & Station: normal Patient leans: N/A   Psychiatric Specialty Exam:  Presentation  General Appearance:  Appropriate for Environment; Casual  Eye Contact: Good  Speech: Clear  and Coherent; Normal Rate  Speech Volume: Normal  Handedness:No data recorded  Mood and Affect  Mood: Euthymic  Affect: Appropriate; Congruent   Thought Process  Thought Processes: Coherent  Descriptions of Associations:Intact  Orientation:Full (Time, Place and Person)  Thought Content:WDL  History of Schizophrenia/Schizoaffective disorder:No data recorded Duration of Psychotic Symptoms:No data recorded Hallucinations:No data recorded Ideas of Reference:None  Suicidal Thoughts:No data recorded Homicidal Thoughts:No data recorded  Sensorium  Memory: Immediate Fair; Recent Fair  Judgment: Fair  Insight: Fair; Shallow   Executive Functions  Concentration: Good  Attention Span: Good  Recall: [place] of Knowledge: Good  Language: Good   Psychomotor Activity  Psychomotor Activity:No data recorded  Assets  Assets: Desire for Improvement; Housing; Social Support; Talents/Skills   Sleep  Sleep:No data recorded   Physical Exam: Physical Exam Vitals and nursing note reviewed.  Constitutional:      Appearance: Normal appearance. He is normal weight.  Neurological:     General: No focal deficit present.     Mental Status: He is alert and oriented to person, place, and time.  Psychiatric:        Attention and Perception: Attention and perception normal.        Mood and Affect: Mood and affect normal.        Speech: Speech normal.        Behavior: Behavior normal. Behavior is cooperative.        Thought Content: Thought content normal.        Cognition and Memory: Cognition and memory normal.        Judgment: Judgment normal.    Review of Systems  Constitutional: Negative.   HENT: Negative.    Eyes: Negative.   Respiratory: Negative.    Cardiovascular: Negative.   Gastrointestinal: Negative.   Genitourinary: Negative.   Musculoskeletal: Negative.   Skin: Negative.   Neurological: Negative.   Endo/Heme/Allergies: Negative.    Psychiatric/Behavioral: Negative.     Blood pressure 136/79, pulse (!) 58, temperature 97.9 F (36.6 C), resp. rate 18, height '5\' 7"'$  (1.702 m), weight 89 kg, SpO2 100 %. Body mass index is 30.73 kg/m.   Social History   Tobacco Use  Smoking Status Former   Types: Cigarettes   Quit date: 2011   Years since quitting: 13.1  Smokeless Tobacco Former  Tobacco Cessation:  N/A, patient does not currently use tobacco products   Blood Alcohol level:  Lab Results  Component Value Date   ETH <10 123456    Metabolic Disorder Labs:  Lab Results  Component Value Date   HGBA1C 5.6 07/27/2022   MPG 114.02 07/27/2022   No results found for: "PROLACTIN" Lab Results  Component Value Date   CHOL 104 07/28/2022   TRIG 128 07/28/2022   HDL 38 (L) 07/28/2022   CHOLHDL 2.7 07/28/2022   VLDL 26 07/28/2022   LDLCALC 40 07/28/2022   LDLCALC 86 07/25/2021    See Psychiatric Specialty Exam and Suicide Risk Assessment completed by Attending Physician prior to discharge.  Discharge destination:  Home  Is patient on multiple antipsychotic therapies at discharge:  No   Has Patient had three or more failed trials of antipsychotic monotherapy by history:  No  Recommended Plan for Multiple Antipsychotic Therapies: NA   Allergies as of 08/10/2022       Reactions   Trileptal [oxcarbazepine] Rash        Medication List     TAKE these medications      Indication  amiodarone 200 MG tablet Commonly known as: PACERONE Take 1 tablet (200 mg total) by mouth 2 (two) times daily for 7 days, THEN 1 tablet (200 mg total) daily. Start taking on: July 08, 2022 What changed: See the new instructions.    apixaban 5 MG Tabs tablet Commonly known as: ELIQUIS Take 1 tablet (5 mg total) by mouth 2 (two) times daily.    lamoTRIgine 100 MG tablet Commonly known as: LAMICTAL Take 1 tablet (100 mg total) by mouth at bedtime.  Indication: Manic-Depression   levothyroxine 125 MCG  tablet Commonly known as: SYNTHROID Take 125 mcg by mouth daily.    LORazepam 0.5 MG tablet Commonly known as: ATIVAN Take 1 tablet (0.5 mg total) by mouth every 6 (six) hours as needed for anxiety. What changed:  medication strength how much to take when to take this reasons to take this    metoprolol tartrate 50 MG tablet Commonly known as: LOPRESSOR Take 1 tablet (50 mg total) by mouth 2 (two) times daily.    omeprazole 20 MG tablet Commonly known as: PRILOSEC OTC Take 20 mg by mouth 2 (two) times daily.    oxyBUTYnin Chloride 2.5 MG Tabs Take 2.5 mg by mouth 3 (three) times daily.  Indication: Bedwetting, Urinary Incontinence   polyethylene glycol 17 g packet Commonly known as: MIRALAX / GLYCOLAX Take 17 g by mouth daily as needed for constipation.    QUEtiapine 200 MG 24 hr tablet Commonly known as: SEROQUEL XR Take 1 tablet (200 mg total) by mouth at bedtime.  Indication: Manic-Depression   rosuvastatin 10 MG tablet Commonly known as: CRESTOR Take 10 mg by mouth every evening.         Follow-up Information     [facility] Follow up on 08/29/2022.   Specialty: Behavioral Health Why: You have an appointment for follow up with Dr. Casimiro Needle, scheduled for Wednesday March 20th at 3pm. Please contact their office if needed to reschdule. Thanks! Contact information: [place] [phone]                Follow-up recommendations:  Dr. Casimiro Needle   Signed: Parks Ranger, DO 08/10/2022, 10:32 AM

## 2022-08-10 NOTE — BHH Counselor (Signed)
CSW followed up with pt regarding HIPAA paperwork and medical record forms.   Pt provided verbal permission for ROI and scheduling follow up and provided verbal permission for consent to release information at admission.   CSW made attempt for pt to sign forms and he declined stating "not right now, I'm sorry." Pt continues to decline signing but maintains verbal permission.   Karthikeya Funke [place], MSW, LCSW-A 3/1/202411:13 AM

## 2022-08-10 NOTE — Progress Notes (Signed)
Patient ID: [name], male   DOB: 01/16/1941, 82 y.o.   MRN: [id number]  Patient was discharged from [facility] unit at approx 1310 escorted by staff. Patient denies SI/HI/AVH. Discharge packet to include printed AVS, Suicide Risk Assessment, and Transition Record reviewed with patient. Belongings to include eyeglasses with black case kept on person. Suicide safety plan completed with a copy kept in chart.

## 2022-08-10 NOTE — Progress Notes (Signed)
Patient  was cooperative and pleasant on shift. He denied SI, HI &AVH. He voiced that he was looking forward to discharge today. No new issues to report on shift at this time.

## 2022-08-13 DIAGNOSIS — F314 Bipolar disorder, current episode depressed, severe, without psychotic features: Secondary | ICD-10-CM | POA: Diagnosis not present

## 2022-08-13 DIAGNOSIS — F411 Generalized anxiety disorder: Secondary | ICD-10-CM | POA: Diagnosis not present

## 2022-08-21 DIAGNOSIS — R252 Cramp and spasm: Secondary | ICD-10-CM | POA: Diagnosis not present

## 2022-08-21 DIAGNOSIS — Z6830 Body mass index (BMI) 30.0-30.9, adult: Secondary | ICD-10-CM | POA: Diagnosis not present

## 2022-08-21 NOTE — Progress Notes (Unsigned)
[facility]                                     Cardiology Office Note:    Date:  08/23/2022   ID:  [name], DOB 1940-08-14, MRN [id number]  PCP:  [name], MD  [facility] Cardiologist:  None  [facility] Electrophysiologist:  None   Referring MD: [name], MD   Chief Complaint  Patient presents with   Follow-up    Pre LAAO    History of Present Illness:    [name] is a 82 y.o. male with a hx of CAD, paroxysmal afib w/ hx prior AF ablation on Eliquis, hypertension, dyslipidemia, mild aortic stenosis, renal insufficiency, dementia, and bipolar disorder  who was referred to Dr. Quentin Ore for the evaluation of [place] closure with Watchman.   [name] is followed by Dr. Lennox Pippins for his cardiology care. He was recently seen 03/26/22 and was having difficulty with medication compliance with frequent missed doses while on coumadin. He was unable to afford NOAC.   In consultation he was felt to be a good candidate. He underwent CT imagine that showed anatomy suitable for implant.   Unfortunately he was admitted to [facility] and seen in cardiology consultation 07/05/22 due to AMS and elevated HsT. Echocardiogram at that time showed normal LV function, no RWMA, mild MR, and aortic calcifications but no AS. He was discharged on 07/08/22. Coumadin was stopped and he was continued on metoprolol and Amiodarone was started. By discharge, he converted to NSR.   He was contacted by telephone with wife reporting severe confusion with multiple falls. His Watchman case was cancelled. He was scheduled to see Dr. Julianne Rice 2/19 however was readmitted with symptoms worrisome for CVA before that appointment. He was treated with TNK with TIA felt likely due to AF with no anticoagulation. Appears that he was started on Eliquis at that time. He was evaluated by psychiatry and recommended for inpatient psychiatric treatment due to safety concerns and unmanaged  bipolar disorder. He was admitted to inpatient services and discharged on 08/10/22.   Today he is here with his wife. He appears to be doing better however is refusing to move forward with LAAO. In talking with them, they report that his Eliquis was not RX'ed at discharge therefore it is unclear how long he has been without AC. I have given them a month worth of samples and information for patient assistance. Wife states BPs have been stable in the AM hours but creeps up over the course of the day. He denies chest pain, palpitations, SOB, LE edema, orthopnea, dizziness, or syncope.    Past Medical History:  Diagnosis Date   Acquired hypothyroidism [id number]   Acute metabolic encephalopathy [id number]   Acute MI, true posterior wall, subsequent episode of care ([place]) 08/15/2014   EF 44% with mildly reduced LV function  Formatting of this note might be different from the original. EF 44% with mildly reduced LV function   AKI (acute kidney injury) ([place]) 09/20/2014   Anxiety    Aspiration pneumonia ([place]) 07/05/2022   Atrial fibrillation ([place]) 09/28/2019   ablation in past  Formatting of this note might be different from the original. ablation in past   Bipolar disorder [facility])    w/dementia/psychotic episodes   Bradycardia 09/17/2014   Cancer ([place]) 09/28/2019   Prostate Cancer; Throat Cancer  Formatting of this note might be different from the original. Prostate Cancer; Throat Cancer   Cardiac murmur 09/28/2019   Cataract    Cervical radicular pain 07/16/2019   Cervical spondylosis 06/19/2019   Chest pain with high risk for cardiac etiology 08/19/2014   Chronic depression    Chronic pain due to trauma 07/16/2019   Coffee ground emesis 07/07/2022   Confusion 07/07/2022   Corn of toe 12/11/2018   Coronary artery calcification 07/25/2021   Coronary artery disease 08/10/2014   Degenerative disc disease, cervical 09/29/2018   Degenerative lumbar disc    Dementia without behavioral disturbance  ([place]) 07/16/2019   Disease characterized by destruction of skeletal muscle 08/15/2014   Essential hypertension 09/28/2019   Essential tremor 09/21/2014   History of MI (myocardial infarction) 09/14/2014   Hypercholesteremia    Hypertension    Hypotension 09/14/2014   Long term (current) use of anticoagulants 11/14/2021   Malignant neoplasm of prostate ([place])    Malnutrition of moderate degree [id number]   Metabolic encephalopathy [id number]   Mild aortic stenosis 11/26/2019   Mood change 10/12/2019   Myocardial infarction ([place]) 08/10/2014   Nausea and vomiting 08/19/2014   Onychomycosis of left great toe 08/21/2014   Pain of fifth toe 12/11/2018   Paroxysmal atrial fibrillation ([place]) 07/25/2021   Peptic esophagitis    Peripheral neuropathy    Peripheral neuropathy due to chemotherapy ([place]) 09/21/2014   Prolonged Q-T interval on ECG 08/19/2014   Prostate cancer ([place]) 09/28/2019   Reactive depression 10/12/2019   Renal insufficiency 08/17/2014   Right bundle branch block 09/28/2019   Sepsis ([place]) 05/18/2020   Small fiber neuropathy 12/16/2018   Squamous cell carcinoma of left tonsil (HCC)    Stage 3b chronic kidney disease ([place]) 12/20/2021   Throat cancer ([place])    Thyroid disease    Transaminitis 08/17/2014   Unstable angina ([place]) 09/14/2014   Whiplash injury syndrome, sequela 08/11/2018    Past Surgical History:  Procedure Laterality Date   CARDIAC ELECTROPHYSIOLOGY STUDY AND ABLATION     COLONOSCOPY  04/07/2007   Small colonic polyp status post polypectomy. Pancolonic diverticulosis predominantly in the sigmoid colon. Internal hemorrhoids.    ESOPHAGOGASTRODUODENOSCOPY  06/15/2010   Erosive esophagitis. Status post PEG placment   EYE SURGERY     INGUINAL HERNIA REPAIR     PROSTATE BIOPSY     SEBACEOUS CYST REMOVAL     TONSILLECTOMY      Current Medications: Current Meds  Medication Sig   amiodarone (PACERONE) 200 MG tablet Take 200 mg by mouth daily.    lamoTRIgine (LAMICTAL) 100 MG tablet Take 1 tablet (100 mg total) by mouth at bedtime.   levothyroxine (SYNTHROID) 125 MCG tablet Take 125 mcg by mouth daily.   LORazepam (ATIVAN) 0.5 MG tablet Take 1 tablet (0.5 mg total) by mouth every 6 (six) hours as needed for anxiety.   metoprolol tartrate (LOPRESSOR) 50 MG tablet TAKE 100 MG (2 TABLETS) IN THE AM AND 50 MG (1 TABLET) IN THE PM BY MOUTH   omeprazole (PRILOSEC OTC) 20 MG tablet Take 20 mg by mouth 2 (two) times daily.   QUEtiapine (SEROQUEL XR) 200 MG 24 hr tablet Take 1 tablet (200 mg total) by mouth at bedtime.   rosuvastatin (CRESTOR) 10 MG tablet Take 10 mg by mouth every evening.   [DISCONTINUED] apixaban (ELIQUIS) 5 MG TABS tablet Take 1 tablet (5 mg total) by mouth 2 (two) times daily.   [DISCONTINUED] metoprolol tartrate (LOPRESSOR) 50 MG tablet  Take 1 tablet (50 mg total) by mouth 2 (two) times daily.   [DISCONTINUED] oxybutynin 2.5 MG TABS Take 2.5 mg by mouth 3 (three) times daily.   [DISCONTINUED] polyethylene glycol (MIRALAX / GLYCOLAX) 17 g packet Take 17 g by mouth daily as needed for constipation.     Allergies:   Trileptal [oxcarbazepine]   Social History   Socioeconomic History   Marital status: Married    Spouse name: Not on file   Number of children: 3   Years of education: 10th grade   Highest education level: Not on file  Occupational History   Occupation: Retired  Tobacco Use   Smoking status: Former    Types: Cigarettes    Quit date: 2011    Years since quitting: 13.2   Smokeless tobacco: Former  Scientific laboratory technician Use: Never used  Substance and Sexual Activity   Alcohol use: Not Currently   Drug use: Never   Sexual activity: Not Currently  Other Topics Concern   Not on file  Social History Narrative   Lives at home with wife.   Right-handed.   2 cups of caffeine per day.   Social Determinants of Health   Financial Resource Strain: Not on file  Food Insecurity: No Food Insecurity  (08/02/2022)   Hunger Vital Sign    Worried About Running Out of Food in the Last Year: Never true    Ran Out of Food in the Last Year: Never true  Transportation Needs: No Transportation Needs (08/02/2022)   PRAPARE - Hydrologist (Medical): No    Lack of Transportation (Non-Medical): No  Physical Activity: Not on file  Stress: Not on file  Social Connections: Not on file     Family History: The patient's family history includes Heart disease in his father; Other in his mother; Prostate cancer in his father; Stomach cancer in his father. There is no history of Colon cancer, Esophageal cancer, or Rectal cancer.  ROS:   Please see the history of present illness.    All other systems reviewed and are negative.  EKGs/Labs/Other Studies Reviewed:    The following studies were reviewed today:  Echocardiogram 07/06/22:  1. Left ventricular ejection fraction, by estimation, is 55 to 60%. The  left ventricle has normal function. The left ventricle has no regional  wall motion abnormalities. There is moderate left ventricular hypertrophy.  Left ventricular diastolic  parameters are indeterminate.   2. Right ventricular systolic function is normal. The right ventricular  size is normal.   3. Left atrial size was moderately dilated.   4. The mitral valve is abnormal. Mild mitral valve regurgitation. No  evidence of mitral stenosis.   5. Fused right and left cusp suspect there is at least mild stenosis  Consider having patient return for dedicated CW doppler interrogation of  AV. The aortic valve is normal in structure. There is moderate  calcification of the aortic valve. There is  moderate thickening of the aortic valve. Aortic valve regurgitation is  mild. Aortic valve sclerosis/calcification is present, without any  evidence of aortic stenosis.   6. The inferior vena cava is normal in size with greater than 50%  respiratory variability, suggesting right  atrial pressure of 3 mmHg.    EKG:  EKG is ordered today.  The ekg ordered today demonstrates NSR with 1st degree AV block.   Recent Labs: 07/05/2022: ALT 17 08/01/2022: TSH 23.659 08/02/2022: BUN 14; Creatinine, Ser 1.80; Hemoglobin  11.5; Platelets 142; Potassium 3.8; Sodium 140  Recent Lipid Panel    Component Value Date/Time   CHOL 104 07/28/2022 0117   CHOL 173 07/25/2021 0923   TRIG 128 07/28/2022 0117   HDL 38 (L) 07/28/2022 0117   HDL 44 07/25/2021 0923   CHOLHDL 2.7 07/28/2022 0117   VLDL 26 07/28/2022 0117   LDLCALC 40 07/28/2022 0117   LDLCALC 86 07/25/2021 0923    Risk Assessment/Calculations:    HAS-BLED score 2 Hypertension Yes  Abnormal renal and liver function (Dialysis, transplant, Cr >2.26 mg/dL /Cirrhosis or Bilirubin >2x Normal or AST/ALT/AP >3x Normal) No  Stroke No  Bleeding No  Labile INR (Unstable/high INR) No  Elderly (>65) Yes  Drugs or alcohol (? 8 drinks/week, anti-plt or NSAID) No    CHA2DS2-VASc Score = 3  The patient's score is based upon: CHF History: 0 HTN History: 1 Diabetes History: 0 Stroke History: 0 Vascular Disease History: 0 Age Score: 2 Gender Score: 0   Physical Exam:    VS:  BP (!) 154/90   Pulse 60   Ht '5\' 8"'$  (1.727 m)   Wt 208 lb 9.6 oz (94.6 kg)   SpO2 97%   BMI 31.72 kg/m     Wt Readings from Last 3 Encounters:  08/22/22 208 lb 9.6 oz (94.6 kg)  08/02/22 196 lb 3.4 oz (89 kg)  07/29/22 209 lb 3.5 oz (94.9 kg)    General: Well developed, well nourished, NAD Lungs:Clear to ausculation bilaterally. No wheezes, rales, or rhonchi. Breathing is unlabored. Cardiovascular: RRR with S1 S2. No murmurs Extremities: No edema. Neuro: Alert and oriented. No focal deficits. No facial asymmetry. MAE spontaneously. Psych: Responds to questions appropriately with normal affect.    ASSESSMENT/PLAN:    PAF: Initially scheduled for LAAO closure with Watchman however has had significant issues with falls and confusion therefore  his Coumadin was stopped. Unfortunately he suffered a TIA after stopping AC and was placed on Eliquis. Given his confusion and behavioral issues with thoughts of self harm, he was transferred to inpatient [facility] for several weeks. He is here today and reports his Eliquis was not RX'ed at discharge. Unclear how long he has not been on AC. Given 1 month sample supply today with patient assistance information. Watchman was previously cancelled. Plan for the patient to follow with PCP and Dr. Lennox Pippins. If he remains interested in [place] closure at a later time, we will schedule an appointment with Dr. Quentin Ore again.   HTN: Elevated BP in the evenings. Currently on Metoprolol '50mg'$  BID. Will have him increase AM dose to '100mg'$  and continue '50mg'$  in PM. Follow with Dr. Lennox Pippins.   Bipolar disorder: Recent inpatient behavioral admission for self harm thoughts. Continue to follow with psychiatry.    Murmur: Noted to have prominent murmur on exam today. Echo from 07/06/22 showed normal LVEF at 55-60% with mild MR, fused right and left cusp suspect there is at least mild stenosis. Consider dedicated CW doppler interrogation of AV. The aortic valve is normal in structure. There is moderate calcification of the aortic valve. There is  moderate thickening of the aortic valve. Aortic valve regurgitation is  mild. Aortic valve sclerosis/calcification is present, without any  evidence of aortic stenosis.    CKD stage IIIb: Cr at 1.8 on most recent labs. Baseline appears to be in the 1.3-1.7 range. Would consider referral to nephrology.    Medication Adjustments/Labs and Tests Ordered: Current medicines are reviewed at length with the patient today.  Concerns regarding medicines are outlined above.  Orders Placed This Encounter  Procedures   Basic metabolic panel   CBC   EKG 12-Lead   Meds ordered this encounter  Medications   metoprolol tartrate (LOPRESSOR) 50 MG tablet    Sig: TAKE 100 MG (2 TABLETS) IN THE AM AND  50 MG (1 TABLET) IN THE PM BY MOUTH    Dispense:  270 tablet    Refill:  3    Patient Instructions  Medication Instructions:  Your physician has recommended you make the following change in your medication:  INCREASE METOPROLOL TO 100 MG (2 TABLETS) IN THE AM AND 50 MG (1 TABLET) IN THE PM.   *If you need a refill on your cardiac medications before your next appointment, please call your pharmacy*   Lab Work: NONE If you have labs (blood work) drawn today and your tests are completely normal, you will receive your results only by: [place] (if you have MyChart) OR A paper copy in the mail If you have any lab test that is abnormal or we need to change your treatment, we will call you to review the results.   Testing/Procedures: NONE   Follow-Up: At [facility], you and your health needs are our priority.  As part of our continuing mission to provide you with exceptional heart care, we have created designated Provider Care Teams.  These Care Teams include your primary Cardiologist (physician) and Advanced Practice Providers (APPs -  Physician Assistants and Nurse Practitioners) who all work together to provide you with the care you need, when you need it.  We recommend signing up for the patient portal called "MyChart".  Sign up information is provided on this After Visit Summary.  MyChart is used to connect with patients for Virtual Visits (Telemedicine).  Patients are able to view lab/test results, encounter notes, upcoming appointments, etc.  Non-urgent messages can be sent to your provider as well.   To learn more about what you can do with MyChart, go to [email or web address].    Your next appointment:   Your physician recommends that you schedule a follow-up appointment WITH DR. Lennox Pippins NEXT AS SOON AS POSSIBLE     Signed, Kathyrn Drown, NP  08/23/2022 10:53 AM    [place]

## 2022-08-22 ENCOUNTER — Ambulatory Visit: Payer: Medicare Other | Attending: Cardiology | Admitting: Cardiology

## 2022-08-22 VITALS — BP 154/90 | HR 60 | Ht 68.0 in | Wt 208.6 lb

## 2022-08-22 DIAGNOSIS — I10 Essential (primary) hypertension: Secondary | ICD-10-CM

## 2022-08-22 DIAGNOSIS — I4891 Unspecified atrial fibrillation: Secondary | ICD-10-CM | POA: Diagnosis not present

## 2022-08-22 DIAGNOSIS — N1832 Chronic kidney disease, stage 3b: Secondary | ICD-10-CM | POA: Diagnosis not present

## 2022-08-22 DIAGNOSIS — I350 Nonrheumatic aortic (valve) stenosis: Secondary | ICD-10-CM | POA: Diagnosis not present

## 2022-08-22 DIAGNOSIS — F314 Bipolar disorder, current episode depressed, severe, without psychotic features: Secondary | ICD-10-CM | POA: Insufficient documentation

## 2022-08-22 DIAGNOSIS — Z7901 Long term (current) use of anticoagulants: Secondary | ICD-10-CM

## 2022-08-22 DIAGNOSIS — Z01818 Encounter for other preprocedural examination: Secondary | ICD-10-CM | POA: Insufficient documentation

## 2022-08-22 DIAGNOSIS — I480 Paroxysmal atrial fibrillation: Secondary | ICD-10-CM

## 2022-08-22 DIAGNOSIS — I2510 Atherosclerotic heart disease of native coronary artery without angina pectoris: Secondary | ICD-10-CM | POA: Diagnosis not present

## 2022-08-22 DIAGNOSIS — I2584 Coronary atherosclerosis due to calcified coronary lesion: Secondary | ICD-10-CM | POA: Insufficient documentation

## 2022-08-22 MED ORDER — METOPROLOL TARTRATE 50 MG PO TABS: ORAL_TABLET | ORAL | 3 refills | Status: DC

## 2022-08-22 NOTE — Patient Instructions (Addendum)
Medication Instructions:  Your physician has recommended you make the following change in your medication:  INCREASE METOPROLOL TO 100 MG (2 TABLETS) IN THE AM AND 50 MG (1 TABLET) IN THE PM.   *If you need a refill on your cardiac medications before your next appointment, please call your pharmacy*   Lab Work: NONE If you have labs (blood work) drawn today and your tests are completely normal, you will receive your results only by: [place] (if you have MyChart) OR A paper copy in the mail If you have any lab test that is abnormal or we need to change your treatment, we will call you to review the results.   Testing/Procedures: NONE   Follow-Up: At [facility], you and your health needs are our priority.  As part of our continuing mission to provide you with exceptional heart care, we have created designated Provider Care Teams.  These Care Teams include your primary Cardiologist (physician) and Advanced Practice Providers (APPs -  Physician Assistants and Nurse Practitioners) who all work together to provide you with the care you need, when you need it.  We recommend signing up for the patient portal called "MyChart".  Sign up information is provided on this After Visit Summary.  MyChart is used to connect with patients for Virtual Visits (Telemedicine).  Patients are able to view lab/test results, encounter notes, upcoming appointments, etc.  Non-urgent messages can be sent to your provider as well.   To learn more about what you can do with MyChart, go to [email or web address].    Your next appointment:   Your physician recommends that you schedule a follow-up appointment WITH DR. Lennox Pippins NEXT AS SOON AS POSSIBLE

## 2022-08-27 ENCOUNTER — Telehealth: Payer: Self-pay | Admitting: Cardiology

## 2022-08-27 ENCOUNTER — Other Ambulatory Visit: Payer: Self-pay

## 2022-08-27 NOTE — Telephone Encounter (Signed)
Called the patient's wife and she stated that the patient was having high blood pressures systolically [id number]. During his last office visit he was started on Metoprolol 100 mg in am and 50 mg in pm. The patient's wife stated that the patient's heart rate has been in 50's and 40's for the past couple of days. His current blood pressures are:  142/68 HR 47 140/76 HR 46 150/76 HR 45  Please advise.

## 2022-08-27 NOTE — Telephone Encounter (Signed)
Pt c/o medication issue:  1. Name of Medication: Metoprolol  100 mg in the morning and 50 mg at night  2. How are you currently taking this medication (dosage and times per day)?   3. Are you having a reaction (difficulty breathing--STAT)?   4. What is your medication issue? Made his heart rate have dropped to 45,46 and 47- it is 47 at this time    STAT if HR is under 50 or over 120 (normal HR is 60-100 beats per minute)  What is your heart rate? 477  Do you have a log of your heart rate readings (document readings)?   Do you have any other symptoms? Just does nt feel well

## 2022-08-28 NOTE — Telephone Encounter (Signed)
Called Marilynn, the patient's wife, and informed her of Dr. Julien Nordmann recommendation below regarding the patient's low heart rates:  "Looks stable. Continue current treatment."  Patient's wife verbalized understanding and had no further questions at this time.

## 2022-08-29 ENCOUNTER — Ambulatory Visit (HOSPITAL_COMMUNITY): Payer: Medicare Other | Admitting: Psychiatry

## 2022-08-30 ENCOUNTER — Inpatient Hospital Stay (HOSPITAL_COMMUNITY): Admit: 2022-08-30 | Payer: Medicare Other | Admitting: Cardiology

## 2022-08-30 ENCOUNTER — Encounter (HOSPITAL_COMMUNITY): Payer: Self-pay

## 2022-08-30 DIAGNOSIS — I4891 Unspecified atrial fibrillation: Secondary | ICD-10-CM

## 2022-08-30 SURGERY — LEFT ATRIAL APPENDAGE OCCLUSION
Anesthesia: General

## 2022-09-05 ENCOUNTER — Other Ambulatory Visit: Payer: Self-pay | Admitting: Internal Medicine

## 2022-09-07 ENCOUNTER — Other Ambulatory Visit: Payer: Self-pay | Admitting: Cardiology

## 2022-09-11 ENCOUNTER — Telehealth: Payer: Self-pay

## 2022-09-11 ENCOUNTER — Other Ambulatory Visit: Payer: Self-pay | Admitting: Internal Medicine

## 2022-09-11 ENCOUNTER — Other Ambulatory Visit: Payer: Self-pay | Admitting: Cardiology

## 2022-09-11 NOTE — Patient Outreach (Signed)
  Care Coordination   Initial Visit Note   09/11/2022 Name: Shen Reitmeier MRN: [id number] DOB: Sep 08, 1940  Domenic Moras Dye III is a 82 y.o. year old male who sees [name], MD for primary care. I spoke with  [name]'s wife Leda Gauze by phone today.Wife is power of attorney. Confirmed by chart.   What matters to the patients health and wellness today?  Reviewed [facility] program with wife who states that patient is seeing a case manager in the office in 1 month.  Wife denies needs today.       SDOH assessments and interventions completed:  No     Care Coordination Interventions:  No, not indicated   Follow up plan: No further intervention required.   Encounter Outcome:  Pt. Refused    Tomasa Rand, RN, BSN, CEN [facility] [employer] [phone]

## 2022-09-18 ENCOUNTER — Other Ambulatory Visit: Payer: Self-pay

## 2022-09-20 DIAGNOSIS — Z6829 Body mass index (BMI) 29.0-29.9, adult: Secondary | ICD-10-CM | POA: Diagnosis not present

## 2022-09-20 DIAGNOSIS — R300 Dysuria: Secondary | ICD-10-CM | POA: Diagnosis not present

## 2022-09-20 DIAGNOSIS — R252 Cramp and spasm: Secondary | ICD-10-CM | POA: Diagnosis not present

## 2022-09-24 ENCOUNTER — Ambulatory Visit: Payer: Medicare Other | Attending: Cardiology | Admitting: Cardiology

## 2022-09-24 ENCOUNTER — Encounter: Payer: Self-pay | Admitting: Cardiology

## 2022-09-24 VITALS — BP 92/62 | HR 53 | Ht 67.0 in | Wt 201.0 lb

## 2022-09-24 DIAGNOSIS — I2584 Coronary atherosclerosis due to calcified coronary lesion: Secondary | ICD-10-CM | POA: Diagnosis not present

## 2022-09-24 DIAGNOSIS — I2510 Atherosclerotic heart disease of native coronary artery without angina pectoris: Secondary | ICD-10-CM | POA: Diagnosis not present

## 2022-09-24 DIAGNOSIS — I480 Paroxysmal atrial fibrillation: Secondary | ICD-10-CM

## 2022-09-24 DIAGNOSIS — I10 Essential (primary) hypertension: Secondary | ICD-10-CM

## 2022-09-24 NOTE — Patient Instructions (Signed)
Medication Instructions:  Your physician recommends that you continue on your current medications as directed. Please refer to the Current Medication list given to you today.  *If you need a refill on your cardiac medications before your next appointment, please call your pharmacy*   Lab Work: None ordered If you have labs (blood work) drawn today and your tests are completely normal, you will receive your results only by: MyChart Message (if you have MyChart) OR A paper copy in the mail If you have any lab test that is abnormal or we need to change your treatment, we will call you to review the results.   Testing/Procedures: None ordered   Follow-Up: At [facility], you and your health needs are our priority.  As part of our continuing mission to provide you with exceptional heart care, we have created designated Provider Care Teams.  These Care Teams include your primary Cardiologist (physician) and Advanced Practice Providers (APPs -  Physician Assistants and Nurse Practitioners) who all work together to provide you with the care you need, when you need it.  We recommend signing up for the patient portal called "MyChart".  Sign up information is provided on this After Visit Summary.  MyChart is used to connect with patients for Virtual Visits (Telemedicine).  Patients are able to view lab/test results, encounter notes, upcoming appointments, etc.  Non-urgent messages can be sent to your provider as well.   To learn more about what you can do with MyChart, go to [email or web address].    Your next appointment:   6 month(s)  The format for your next appointment:   In Person  Provider:   Rajan Revankar, MD    Other Instructions none  Important Information About Sugar       

## 2022-09-24 NOTE — Progress Notes (Signed)
Cardiology Office Note:    Date:  09/24/2022   ID:  [name], DOB 1941/04/11, MRN [id number]  PCP:  Marylen Ponto, MD  Cardiologist:  Garwin Brothers, MD   Referring MD: Marylen Ponto, MD    ASSESSMENT:    1. Paroxysmal atrial fibrillation   2. Coronary artery calcification   3. Coronary artery disease involving native coronary artery of native heart without angina pectoris   4. Essential hypertension    PLAN:    In order of problems listed above:  Coronary artery calcification: Secondary prevention stressed with the patient.  Importance of compliance with diet medication stressed and vocalized understanding. Mixed dyslipidemia: On lipid-lowering medications followed by primary care. Paroxysmal atrial fibrillation: Stable at this time.  He had multiple questions about Watchman device.  Again he is not a candidate for anticoagulation and complete details were discussed in the visit notes from the last office visit.  I discussed with him the benefits risks.  He is high risk for anticoagulation because of fall potential and such.  He is agreeable to proceed with the Watchman device.  His wife accompanies him for this visit and she mentions to me that she will get in touch with our colleagues and set the appointment for the needful. Patient will be seen in follow-up appointment in 6 months or earlier if the patient has any concerns.    Medication Adjustments/Labs and Tests Ordered: Current medicines are reviewed at length with the patient today.  Concerns regarding medicines are outlined above.  Orders Placed This Encounter  Procedures   EKG 12-Lead   No orders of the defined types were placed in this encounter.    No chief complaint on file.    History of Present Illness:    [name] is a 82 y.o. male.  Patient has past medical history of essential hypertension, paroxysmal atrial fibrillation, coronary artery disease.  He was to go for a Watchman  device.  Unfortunately had an episode of pneumonitis and also stroke for which he underwent thrombolysis.  Subsequently he had issues with some confusion.  He is fine now today he is here for follow-up to discuss watchman again.  He had questions about it.  Past Medical History:  Diagnosis Date   Acquired hypothyroidism 08/17/2014   Acute MI, true posterior wall, subsequent episode of care 08/15/2014   EF 44% with mildly reduced LV function  Formatting of this note might be different from the original. EF 44% with mildly reduced LV function   AKI (acute kidney injury) 09/20/2014   Anxiety    Atrial fibrillation 09/28/2019   ablation in past  Formatting of this note might be different from the original. ablation in past   Bipolar affective disorder, depressed, severe 07/31/2022   Bipolar disorder    w/dementia/psychotic episodes   Cancer 09/28/2019   Prostate Cancer; Throat Cancer  Formatting of this note might be different from the original. Prostate Cancer; Throat Cancer   Cardiac murmur 09/28/2019   Cataract    Cervical radicular pain 07/16/2019   Cervical spondylosis 06/19/2019   Chest pain with high risk for cardiac etiology 08/19/2014   Chronic depression    Chronic pain due to trauma 07/16/2019   Corn of toe 12/11/2018   Coronary artery calcification 07/25/2021   Coronary artery disease 08/10/2014   Degenerative disc disease, cervical 09/29/2018   Degenerative lumbar disc    Dementia without behavioral disturbance 07/16/2019   Disease characterized by  destruction of skeletal muscle 08/15/2014   Essential hypertension 09/28/2019   Essential tremor 09/21/2014   History of MI (myocardial infarction) 09/14/2014   Hypercholesteremia    Hypertension    Hypotension 09/14/2014   Malignant neoplasm of prostate    Mild aortic stenosis 11/26/2019   Mood change 10/12/2019   Myocardial infarction 08/10/2014   Nausea and vomiting 08/19/2014   Onychomycosis of left great toe  08/21/2014   Pain of fifth toe 12/11/2018   Paroxysmal atrial fibrillation 07/25/2021   Peptic esophagitis    Peripheral neuropathy    Peripheral neuropathy due to chemotherapy 09/21/2014   Prolonged Q-T interval on ECG 08/19/2014   Prostate cancer 09/28/2019   Reactive depression 10/12/2019   Renal insufficiency 08/17/2014   Right bundle branch block 09/28/2019   Sepsis 05/18/2020   Small fiber neuropathy 12/16/2018   Squamous cell carcinoma of left tonsil    Stage 3b chronic kidney disease 12/20/2021   Stroke 07/27/2022   Throat cancer    Thyroid disease    Transaminitis 08/17/2014   Unstable angina 09/14/2014   Whiplash injury syndrome, sequela 08/11/2018    Past Surgical History:  Procedure Laterality Date   CARDIAC ELECTROPHYSIOLOGY STUDY AND ABLATION     COLONOSCOPY  04/07/2007   Small colonic polyp status post polypectomy. Pancolonic diverticulosis predominantly in the sigmoid colon. Internal hemorrhoids.    ESOPHAGOGASTRODUODENOSCOPY  06/15/2010   Erosive esophagitis. Status post PEG placment   EYE SURGERY     INGUINAL HERNIA REPAIR     PROSTATE BIOPSY     SEBACEOUS CYST REMOVAL     TONSILLECTOMY      Current Medications: Current Meds  Medication Sig   amiodarone (PACERONE) 200 MG tablet Take 200 mg by mouth daily.   apixaban (ELIQUIS) 5 MG TABS tablet Take 5 mg by mouth daily.   ciprofloxacin (CIPRO) 250 MG tablet Take 250 mg by mouth 2 (two) times daily.   famotidine (PEPCID) 40 MG tablet Take 40 mg by mouth at bedtime as needed for heartburn or indigestion.   lamoTRIgine (LAMICTAL) 100 MG tablet Take 1 tablet (100 mg total) by mouth at bedtime.   levothyroxine (SYNTHROID) 125 MCG tablet Take 125 mcg by mouth daily.   LORazepam (ATIVAN) 0.5 MG tablet Take 1 tablet (0.5 mg total) by mouth every 6 (six) hours as needed for anxiety.   metoprolol tartrate (LOPRESSOR) 50 MG tablet TAKE 100 MG (2 TABLETS) IN THE AM AND 50 MG (1 TABLET) IN THE PM BY MOUTH    omeprazole (PRILOSEC) 40 MG capsule Take 40 mg by mouth 2 (two) times daily.   QUEtiapine (SEROQUEL XR) 200 MG 24 hr tablet Take 1 tablet (200 mg total) by mouth at bedtime.   rosuvastatin (CRESTOR) 10 MG tablet Take 1 tablet (10 mg total) by mouth daily.   warfarin (COUMADIN) 2.5 MG tablet Take 2.5 mg by mouth daily.   [DISCONTINUED] omeprazole (PRILOSEC OTC) 20 MG tablet Take 20 mg by mouth 2 (two) times daily.     Allergies:   Trileptal [oxcarbazepine]   Social History   Socioeconomic History   Marital status: Married    Spouse name: Not on file   Number of children: 3   Years of education: 10th grade   Highest education level: Not on file  Occupational History   Occupation: Retired  Tobacco Use   Smoking status: Former    Types: Cigarettes    Quit date: 2011    Years since quitting: 13.2   Smokeless  tobacco: Former  Building services engineer Use: Never used  Substance and Sexual Activity   Alcohol use: Not Currently   Drug use: Never   Sexual activity: Not Currently  Other Topics Concern   Not on file  Social History Narrative   Lives at home with wife.   Right-handed.   2 cups of caffeine per day.   Social Determinants of Health   Financial Resource Strain: Not on file  Food Insecurity: No Food Insecurity (08/02/2022)   Hunger Vital Sign    Worried About Running Out of Food in the Last Year: Never true    Ran Out of Food in the Last Year: Never true  Transportation Needs: No Transportation Needs (08/02/2022)   PRAPARE - Administrator, Civil Service (Medical): No    Lack of Transportation (Non-Medical): No  Physical Activity: Not on file  Stress: Not on file  Social Connections: Not on file     Family History: The patient's family history includes Heart disease in his father; Other in his mother; Prostate cancer in his father; Stomach cancer in his father. There is no history of Colon cancer, Esophageal cancer, or Rectal cancer.  ROS:   Please see the  history of present illness.    All other systems reviewed and are negative.  EKGs/Labs/Other Studies Reviewed:    The following studies were reviewed today: EKG reveals sinus bradycardia first-degree AV block right bundle branch block and left axis deviation and LVH.   Recent Labs: 07/05/2022: ALT 17 08/01/2022: TSH 23.659 08/02/2022: BUN 14; Creatinine, Ser 1.80; Hemoglobin 11.5; Platelets 142; Potassium 3.8; Sodium 140  Recent Lipid Panel    Component Value Date/Time   CHOL 104 07/28/2022 0117   CHOL 173 07/25/2021 0923   TRIG 128 07/28/2022 0117   HDL 38 (L) 07/28/2022 0117   HDL 44 07/25/2021 0923   CHOLHDL 2.7 07/28/2022 0117   VLDL 26 07/28/2022 0117   LDLCALC 40 07/28/2022 0117   LDLCALC 86 07/25/2021 0923    Physical Exam:    VS:  BP 92/62   Pulse (!) 53   Ht 5\' 7"  (1.702 m)   Wt 201 lb (91.2 kg)   SpO2 96%   BMI 31.48 kg/m     Wt Readings from Last 3 Encounters:  09/24/22 201 lb (91.2 kg)  08/22/22 208 lb 9.6 oz (94.6 kg)  07/29/22 209 lb 3.5 oz (94.9 kg)     GEN: Patient is in no acute distress HEENT: Normal NECK: No JVD; No carotid bruits LYMPHATICS: No lymphadenopathy CARDIAC: Hear sounds regular, 2/6 systolic murmur at the apex. RESPIRATORY:  Clear to auscultation without rales, wheezing or rhonchi  ABDOMEN: Soft, non-tender, non-distended MUSCULOSKELETAL:  No edema; No deformity  SKIN: Warm and dry NEUROLOGIC:  Alert and oriented x 3 PSYCHIATRIC:  Normal affect   Signed, Garwin Brothers, MD  09/24/2022 10:51 AM    [facility]

## 2022-09-27 ENCOUNTER — Telehealth: Payer: Self-pay

## 2022-09-27 NOTE — Telephone Encounter (Signed)
Received message from Dr. Kem Parkinson nurse that [name] is ready to proceed with LAAO (he saw Dr. Tomie China in clinic today).  Called to discuss with patient.   Left message that he will be called 4/24 to make a plan.

## 2022-10-04 NOTE — Telephone Encounter (Signed)
Spoke with Ms. Diviney. She said Dr. Tomie China said the patient is ready for Watchman and she requests for her husband to be rescheduled. Scheduled the patient with Dr. Lalla Brothers 8/1 for evaluation. If he agrees the patient is a go for Watchman, will arrange 02/07/2023. She was grateful for call and agreed with plan.

## 2022-10-07 DIAGNOSIS — I130 Hypertensive heart and chronic kidney disease with heart failure and stage 1 through stage 4 chronic kidney disease, or unspecified chronic kidney disease: Secondary | ICD-10-CM | POA: Diagnosis not present

## 2022-10-07 DIAGNOSIS — Z9221 Personal history of antineoplastic chemotherapy: Secondary | ICD-10-CM | POA: Diagnosis not present

## 2022-10-07 DIAGNOSIS — I219 Acute myocardial infarction, unspecified: Secondary | ICD-10-CM | POA: Diagnosis not present

## 2022-10-07 DIAGNOSIS — R0902 Hypoxemia: Secondary | ICD-10-CM | POA: Diagnosis not present

## 2022-10-07 DIAGNOSIS — R9431 Abnormal electrocardiogram [ECG] [EKG]: Secondary | ICD-10-CM | POA: Diagnosis not present

## 2022-10-07 DIAGNOSIS — I4891 Unspecified atrial fibrillation: Secondary | ICD-10-CM | POA: Diagnosis not present

## 2022-10-07 DIAGNOSIS — N183 Chronic kidney disease, stage 3 unspecified: Secondary | ICD-10-CM | POA: Diagnosis not present

## 2022-10-07 DIAGNOSIS — B9562 Methicillin resistant Staphylococcus aureus infection as the cause of diseases classified elsewhere: Secondary | ICD-10-CM | POA: Diagnosis not present

## 2022-10-07 DIAGNOSIS — N390 Urinary tract infection, site not specified: Secondary | ICD-10-CM | POA: Diagnosis not present

## 2022-10-07 DIAGNOSIS — J189 Pneumonia, unspecified organism: Secondary | ICD-10-CM | POA: Diagnosis not present

## 2022-10-07 DIAGNOSIS — F419 Anxiety disorder, unspecified: Secondary | ICD-10-CM | POA: Diagnosis not present

## 2022-10-07 DIAGNOSIS — Z7901 Long term (current) use of anticoagulants: Secondary | ICD-10-CM | POA: Diagnosis not present

## 2022-10-07 DIAGNOSIS — Z923 Personal history of irradiation: Secondary | ICD-10-CM | POA: Diagnosis not present

## 2022-10-07 DIAGNOSIS — I639 Cerebral infarction, unspecified: Secondary | ICD-10-CM | POA: Diagnosis not present

## 2022-10-07 DIAGNOSIS — I700 Atherosclerosis of aorta: Secondary | ICD-10-CM | POA: Diagnosis not present

## 2022-10-07 DIAGNOSIS — R7989 Other specified abnormal findings of blood chemistry: Secondary | ICD-10-CM | POA: Diagnosis not present

## 2022-10-07 DIAGNOSIS — J439 Emphysema, unspecified: Secondary | ICD-10-CM | POA: Diagnosis not present

## 2022-10-07 DIAGNOSIS — F05 Delirium due to known physiological condition: Secondary | ICD-10-CM | POA: Diagnosis not present

## 2022-10-07 DIAGNOSIS — Z8701 Personal history of pneumonia (recurrent): Secondary | ICD-10-CM | POA: Diagnosis not present

## 2022-10-07 DIAGNOSIS — R4182 Altered mental status, unspecified: Secondary | ICD-10-CM | POA: Diagnosis not present

## 2022-10-07 DIAGNOSIS — F319 Bipolar disorder, unspecified: Secondary | ICD-10-CM | POA: Diagnosis not present

## 2022-10-07 DIAGNOSIS — I444 Left anterior fascicular block: Secondary | ICD-10-CM | POA: Diagnosis not present

## 2022-10-07 DIAGNOSIS — Z91199 Patient's noncompliance with other medical treatment and regimen due to unspecified reason: Secondary | ICD-10-CM | POA: Diagnosis not present

## 2022-10-07 DIAGNOSIS — F03918 Unspecified dementia, unspecified severity, with other behavioral disturbance: Secondary | ICD-10-CM | POA: Diagnosis not present

## 2022-10-07 DIAGNOSIS — G9341 Metabolic encephalopathy: Secondary | ICD-10-CM | POA: Diagnosis not present

## 2022-10-07 DIAGNOSIS — Z79899 Other long term (current) drug therapy: Secondary | ICD-10-CM | POA: Diagnosis not present

## 2022-10-07 DIAGNOSIS — R531 Weakness: Secondary | ICD-10-CM | POA: Diagnosis not present

## 2022-10-07 DIAGNOSIS — I492 Junctional premature depolarization: Secondary | ICD-10-CM | POA: Diagnosis not present

## 2022-10-07 DIAGNOSIS — I509 Heart failure, unspecified: Secondary | ICD-10-CM | POA: Diagnosis not present

## 2022-10-07 DIAGNOSIS — Z8546 Personal history of malignant neoplasm of prostate: Secondary | ICD-10-CM | POA: Diagnosis not present

## 2022-10-07 DIAGNOSIS — K449 Diaphragmatic hernia without obstruction or gangrene: Secondary | ICD-10-CM | POA: Diagnosis not present

## 2022-10-07 DIAGNOSIS — N179 Acute kidney failure, unspecified: Secondary | ICD-10-CM | POA: Diagnosis not present

## 2022-10-07 DIAGNOSIS — I10 Essential (primary) hypertension: Secondary | ICD-10-CM | POA: Diagnosis not present

## 2022-10-07 DIAGNOSIS — Z8589 Personal history of malignant neoplasm of other organs and systems: Secondary | ICD-10-CM | POA: Diagnosis not present

## 2022-10-07 DIAGNOSIS — E7800 Pure hypercholesterolemia, unspecified: Secondary | ICD-10-CM | POA: Diagnosis not present

## 2022-10-07 DIAGNOSIS — I4510 Unspecified right bundle-branch block: Secondary | ICD-10-CM | POA: Diagnosis not present

## 2022-10-07 DIAGNOSIS — I252 Old myocardial infarction: Secondary | ICD-10-CM | POA: Diagnosis not present

## 2022-10-07 DIAGNOSIS — R000 Tachycardia, unspecified: Secondary | ICD-10-CM | POA: Diagnosis not present

## 2022-10-08 ENCOUNTER — Telehealth: Payer: Self-pay

## 2022-10-11 ENCOUNTER — Inpatient Hospital Stay: Payer: Medicare Other | Admitting: Neurology

## 2022-10-15 ENCOUNTER — Other Ambulatory Visit: Payer: Self-pay | Admitting: Cardiology

## 2022-10-15 ENCOUNTER — Other Ambulatory Visit: Payer: Self-pay | Admitting: Internal Medicine

## 2022-10-15 DIAGNOSIS — Z7901 Long term (current) use of anticoagulants: Secondary | ICD-10-CM | POA: Diagnosis not present

## 2022-10-15 DIAGNOSIS — A419 Sepsis, unspecified organism: Secondary | ICD-10-CM | POA: Diagnosis not present

## 2022-10-15 DIAGNOSIS — I252 Old myocardial infarction: Secondary | ICD-10-CM | POA: Diagnosis not present

## 2022-10-15 DIAGNOSIS — R918 Other nonspecific abnormal finding of lung field: Secondary | ICD-10-CM | POA: Diagnosis not present

## 2022-10-15 DIAGNOSIS — R61 Generalized hyperhidrosis: Secondary | ICD-10-CM | POA: Diagnosis not present

## 2022-10-15 DIAGNOSIS — J189 Pneumonia, unspecified organism: Secondary | ICD-10-CM | POA: Diagnosis not present

## 2022-10-15 DIAGNOSIS — T17908A Unspecified foreign body in respiratory tract, part unspecified causing other injury, initial encounter: Secondary | ICD-10-CM | POA: Diagnosis not present

## 2022-10-15 DIAGNOSIS — R6521 Severe sepsis with septic shock: Secondary | ICD-10-CM | POA: Diagnosis not present

## 2022-10-15 DIAGNOSIS — I10 Essential (primary) hypertension: Secondary | ICD-10-CM | POA: Diagnosis not present

## 2022-10-15 DIAGNOSIS — M1990 Unspecified osteoarthritis, unspecified site: Secondary | ICD-10-CM | POA: Diagnosis not present

## 2022-10-15 DIAGNOSIS — R0902 Hypoxemia: Secondary | ICD-10-CM | POA: Diagnosis not present

## 2022-10-15 DIAGNOSIS — Z792 Long term (current) use of antibiotics: Secondary | ICD-10-CM | POA: Diagnosis not present

## 2022-10-15 DIAGNOSIS — Z8701 Personal history of pneumonia (recurrent): Secondary | ICD-10-CM | POA: Diagnosis not present

## 2022-10-15 DIAGNOSIS — Z8501 Personal history of malignant neoplasm of esophagus: Secondary | ICD-10-CM | POA: Diagnosis not present

## 2022-10-15 DIAGNOSIS — F319 Bipolar disorder, unspecified: Secondary | ICD-10-CM | POA: Diagnosis not present

## 2022-10-15 DIAGNOSIS — Z923 Personal history of irradiation: Secondary | ICD-10-CM | POA: Diagnosis not present

## 2022-10-15 DIAGNOSIS — Z87891 Personal history of nicotine dependence: Secondary | ICD-10-CM | POA: Diagnosis not present

## 2022-10-15 DIAGNOSIS — Z8673 Personal history of transient ischemic attack (TIA), and cerebral infarction without residual deficits: Secondary | ICD-10-CM | POA: Diagnosis not present

## 2022-10-15 DIAGNOSIS — F03918 Unspecified dementia, unspecified severity, with other behavioral disturbance: Secondary | ICD-10-CM | POA: Diagnosis not present

## 2022-10-15 DIAGNOSIS — Z888 Allergy status to other drugs, medicaments and biological substances status: Secondary | ICD-10-CM | POA: Diagnosis not present

## 2022-10-15 DIAGNOSIS — R402131 Coma scale, eyes open, to sound, in the field [EMT or ambulance]: Secondary | ICD-10-CM | POA: Diagnosis not present

## 2022-10-15 DIAGNOSIS — J9601 Acute respiratory failure with hypoxia: Secondary | ICD-10-CM | POA: Diagnosis not present

## 2022-10-15 DIAGNOSIS — E039 Hypothyroidism, unspecified: Secondary | ICD-10-CM | POA: Diagnosis not present

## 2022-10-15 DIAGNOSIS — G8929 Other chronic pain: Secondary | ICD-10-CM | POA: Diagnosis not present

## 2022-10-15 DIAGNOSIS — Z87440 Personal history of urinary (tract) infections: Secondary | ICD-10-CM | POA: Diagnosis not present

## 2022-10-15 DIAGNOSIS — R509 Fever, unspecified: Secondary | ICD-10-CM | POA: Diagnosis not present

## 2022-10-15 DIAGNOSIS — R4182 Altered mental status, unspecified: Secondary | ICD-10-CM | POA: Diagnosis not present

## 2022-10-15 DIAGNOSIS — Z7952 Long term (current) use of systemic steroids: Secondary | ICD-10-CM | POA: Diagnosis not present

## 2022-10-15 DIAGNOSIS — F419 Anxiety disorder, unspecified: Secondary | ICD-10-CM | POA: Diagnosis not present

## 2022-10-15 DIAGNOSIS — R000 Tachycardia, unspecified: Secondary | ICD-10-CM | POA: Diagnosis not present

## 2022-10-15 DIAGNOSIS — R402431 Glasgow coma scale score 3-8, in the field [EMT or ambulance]: Secondary | ICD-10-CM | POA: Diagnosis not present

## 2022-10-15 DIAGNOSIS — J9690 Respiratory failure, unspecified, unspecified whether with hypoxia or hypercapnia: Secondary | ICD-10-CM | POA: Diagnosis not present

## 2022-10-15 DIAGNOSIS — K219 Gastro-esophageal reflux disease without esophagitis: Secondary | ICD-10-CM | POA: Diagnosis not present

## 2022-10-15 DIAGNOSIS — E7800 Pure hypercholesterolemia, unspecified: Secondary | ICD-10-CM | POA: Diagnosis not present

## 2022-10-15 DIAGNOSIS — Z79891 Long term (current) use of opiate analgesic: Secondary | ICD-10-CM | POA: Diagnosis not present

## 2022-10-15 DIAGNOSIS — I959 Hypotension, unspecified: Secondary | ICD-10-CM | POA: Diagnosis not present

## 2022-10-15 DIAGNOSIS — R0602 Shortness of breath: Secondary | ICD-10-CM | POA: Diagnosis not present

## 2022-10-15 DIAGNOSIS — J690 Pneumonitis due to inhalation of food and vomit: Secondary | ICD-10-CM | POA: Diagnosis not present

## 2022-10-15 DIAGNOSIS — J988 Other specified respiratory disorders: Secondary | ICD-10-CM | POA: Diagnosis not present

## 2022-10-15 DIAGNOSIS — Z8546 Personal history of malignant neoplasm of prostate: Secondary | ICD-10-CM | POA: Diagnosis not present

## 2022-10-15 DIAGNOSIS — I4891 Unspecified atrial fibrillation: Secondary | ICD-10-CM | POA: Diagnosis not present

## 2022-10-16 DIAGNOSIS — I4891 Unspecified atrial fibrillation: Secondary | ICD-10-CM

## 2022-10-19 DIAGNOSIS — Z792 Long term (current) use of antibiotics: Secondary | ICD-10-CM | POA: Diagnosis not present

## 2022-10-19 DIAGNOSIS — R6521 Severe sepsis with septic shock: Secondary | ICD-10-CM | POA: Diagnosis not present

## 2022-10-19 DIAGNOSIS — I6789 Other cerebrovascular disease: Secondary | ICD-10-CM | POA: Diagnosis not present

## 2022-10-19 DIAGNOSIS — Z515 Encounter for palliative care: Secondary | ICD-10-CM | POA: Diagnosis not present

## 2022-10-19 DIAGNOSIS — A419 Sepsis, unspecified organism: Secondary | ICD-10-CM | POA: Diagnosis not present

## 2022-10-19 DIAGNOSIS — J189 Pneumonia, unspecified organism: Secondary | ICD-10-CM | POA: Diagnosis not present

## 2022-10-19 DIAGNOSIS — Z8546 Personal history of malignant neoplasm of prostate: Secondary | ICD-10-CM | POA: Diagnosis not present

## 2022-10-19 DIAGNOSIS — Z87891 Personal history of nicotine dependence: Secondary | ICD-10-CM | POA: Diagnosis not present

## 2022-10-19 DIAGNOSIS — Z9911 Dependence on respirator [ventilator] status: Secondary | ICD-10-CM | POA: Diagnosis not present

## 2022-10-19 DIAGNOSIS — F03918 Unspecified dementia, unspecified severity, with other behavioral disturbance: Secondary | ICD-10-CM | POA: Diagnosis not present

## 2022-10-19 DIAGNOSIS — Z8673 Personal history of transient ischemic attack (TIA), and cerebral infarction without residual deficits: Secondary | ICD-10-CM | POA: Diagnosis not present

## 2022-10-19 DIAGNOSIS — M1990 Unspecified osteoarthritis, unspecified site: Secondary | ICD-10-CM | POA: Diagnosis not present

## 2022-10-19 DIAGNOSIS — B379 Candidiasis, unspecified: Secondary | ICD-10-CM | POA: Diagnosis not present

## 2022-10-19 DIAGNOSIS — R404 Transient alteration of awareness: Secondary | ICD-10-CM | POA: Diagnosis not present

## 2022-10-19 DIAGNOSIS — F319 Bipolar disorder, unspecified: Secondary | ICD-10-CM | POA: Diagnosis not present

## 2022-10-19 DIAGNOSIS — Z79899 Other long term (current) drug therapy: Secondary | ICD-10-CM | POA: Diagnosis not present

## 2022-10-19 DIAGNOSIS — R918 Other nonspecific abnormal finding of lung field: Secondary | ICD-10-CM | POA: Diagnosis not present

## 2022-10-19 DIAGNOSIS — F419 Anxiety disorder, unspecified: Secondary | ICD-10-CM | POA: Diagnosis not present

## 2022-10-19 DIAGNOSIS — J9601 Acute respiratory failure with hypoxia: Secondary | ICD-10-CM | POA: Diagnosis not present

## 2022-10-19 DIAGNOSIS — Z888 Allergy status to other drugs, medicaments and biological substances status: Secondary | ICD-10-CM | POA: Diagnosis not present

## 2022-10-19 DIAGNOSIS — I700 Atherosclerosis of aorta: Secondary | ICD-10-CM | POA: Diagnosis not present

## 2022-10-19 DIAGNOSIS — E039 Hypothyroidism, unspecified: Secondary | ICD-10-CM | POA: Diagnosis not present

## 2022-10-19 DIAGNOSIS — E7800 Pure hypercholesterolemia, unspecified: Secondary | ICD-10-CM | POA: Diagnosis not present

## 2022-10-19 DIAGNOSIS — I2489 Other forms of acute ischemic heart disease: Secondary | ICD-10-CM | POA: Diagnosis not present

## 2022-10-19 DIAGNOSIS — I4891 Unspecified atrial fibrillation: Secondary | ICD-10-CM | POA: Diagnosis not present

## 2022-10-19 DIAGNOSIS — I4510 Unspecified right bundle-branch block: Secondary | ICD-10-CM | POA: Diagnosis not present

## 2022-10-19 DIAGNOSIS — Z8701 Personal history of pneumonia (recurrent): Secondary | ICD-10-CM | POA: Diagnosis not present

## 2022-10-19 DIAGNOSIS — J90 Pleural effusion, not elsewhere classified: Secondary | ICD-10-CM | POA: Diagnosis not present

## 2022-10-19 DIAGNOSIS — I252 Old myocardial infarction: Secondary | ICD-10-CM | POA: Diagnosis not present

## 2022-10-19 DIAGNOSIS — J690 Pneumonitis due to inhalation of food and vomit: Secondary | ICD-10-CM | POA: Diagnosis not present

## 2022-10-19 DIAGNOSIS — I3139 Other pericardial effusion (noninflammatory): Secondary | ICD-10-CM | POA: Diagnosis not present

## 2022-10-19 DIAGNOSIS — Z7901 Long term (current) use of anticoagulants: Secondary | ICD-10-CM | POA: Diagnosis not present

## 2022-10-19 DIAGNOSIS — I10 Essential (primary) hypertension: Secondary | ICD-10-CM | POA: Diagnosis present

## 2022-10-19 DIAGNOSIS — Z8501 Personal history of malignant neoplasm of esophagus: Secondary | ICD-10-CM | POA: Diagnosis not present

## 2022-10-20 DIAGNOSIS — J90 Pleural effusion, not elsewhere classified: Secondary | ICD-10-CM | POA: Diagnosis not present

## 2022-10-20 DIAGNOSIS — I700 Atherosclerosis of aorta: Secondary | ICD-10-CM | POA: Diagnosis not present

## 2022-10-20 DIAGNOSIS — Z9911 Dependence on respirator [ventilator] status: Secondary | ICD-10-CM | POA: Diagnosis not present

## 2022-10-20 DIAGNOSIS — I3139 Other pericardial effusion (noninflammatory): Secondary | ICD-10-CM | POA: Diagnosis not present

## 2022-10-21 DIAGNOSIS — J90 Pleural effusion, not elsewhere classified: Secondary | ICD-10-CM | POA: Diagnosis not present

## 2022-10-24 ENCOUNTER — Ambulatory Visit (HOSPITAL_COMMUNITY): Payer: Medicare Other | Admitting: Psychiatry

## 2022-11-10 DEATH — deceased

## 2023-01-10 ENCOUNTER — Ambulatory Visit: Payer: Medicare Other | Admitting: Cardiology
